# Patient Record
Sex: Male | Born: 1980 | Race: Black or African American | Hispanic: No | Marital: Single | Smoking: Never smoker
Health system: Southern US, Community
[De-identification: ages and names within clinical notes are randomized; demographics above are authoritative.]

## PROBLEM LIST (undated history)

## (undated) DIAGNOSIS — R29898 Other symptoms and signs involving the musculoskeletal system: Secondary | ICD-10-CM

## (undated) DIAGNOSIS — M79606 Pain in leg, unspecified: Secondary | ICD-10-CM

## (undated) DIAGNOSIS — E119 Type 2 diabetes mellitus without complications: Secondary | ICD-10-CM

## (undated) DIAGNOSIS — J302 Other seasonal allergic rhinitis: Secondary | ICD-10-CM

## (undated) DIAGNOSIS — T7840XA Allergy, unspecified, initial encounter: Secondary | ICD-10-CM

## (undated) HISTORY — DX: Other symptoms and signs involving the musculoskeletal system: R29.898

## (undated) HISTORY — DX: Other seasonal allergic rhinitis: J30.2

## (undated) HISTORY — DX: Allergy, unspecified, initial encounter: T78.40XA

## (undated) HISTORY — DX: Pain in leg, unspecified: M79.606

## (undated) HISTORY — PX: DENTAL SURGERY: SHX609

## (undated) HISTORY — DX: Type 2 diabetes mellitus without complications: E11.9

---

## 2003-02-27 ENCOUNTER — Emergency Department (HOSPITAL_COMMUNITY): Admission: EM | Admit: 2003-02-27 | Discharge: 2003-02-27 | Payer: Self-pay | Admitting: Emergency Medicine

## 2006-10-13 ENCOUNTER — Emergency Department (HOSPITAL_COMMUNITY): Admission: EM | Admit: 2006-10-13 | Discharge: 2006-10-13 | Payer: Self-pay | Admitting: Emergency Medicine

## 2006-10-21 ENCOUNTER — Emergency Department (HOSPITAL_COMMUNITY): Admission: EM | Admit: 2006-10-21 | Discharge: 2006-10-21 | Payer: Self-pay | Admitting: Emergency Medicine

## 2008-02-28 ENCOUNTER — Emergency Department (HOSPITAL_COMMUNITY): Admission: EM | Admit: 2008-02-28 | Discharge: 2008-02-28 | Payer: Self-pay | Admitting: Emergency Medicine
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
## 2011-04-29 ENCOUNTER — Emergency Department (HOSPITAL_COMMUNITY)
Admission: EM | Admit: 2011-04-29 | Discharge: 2011-04-29 | Disposition: A | Discharge status: Home / Self Care | Payer: Self-pay | Attending: Emergency Medicine | Admitting: Emergency Medicine

## 2011-04-29 ENCOUNTER — Emergency Department (HOSPITAL_COMMUNITY): Payer: Self-pay

## 2011-04-29 DIAGNOSIS — Y92009 Unspecified place in unspecified non-institutional (private) residence as the place of occurrence of the external cause: Secondary | ICD-10-CM | POA: Insufficient documentation

## 2011-04-29 DIAGNOSIS — S41109A Unspecified open wound of unspecified upper arm, initial encounter: Secondary | ICD-10-CM | POA: Insufficient documentation

## 2011-04-29 DIAGNOSIS — M79609 Pain in unspecified limb: Secondary | ICD-10-CM | POA: Insufficient documentation

## 2011-04-29 DIAGNOSIS — W260XXA Contact with knife, initial encounter: Secondary | ICD-10-CM | POA: Insufficient documentation

## 2011-05-09 ENCOUNTER — Emergency Department (HOSPITAL_COMMUNITY)
Admission: EM | Admit: 2011-05-09 | Discharge: 2011-05-09 | Disposition: A | Discharge status: Home / Self Care | Payer: Self-pay | Attending: Emergency Medicine | Admitting: Emergency Medicine

## 2011-05-09 DIAGNOSIS — Z0389 Encounter for observation for other suspected diseases and conditions ruled out: Secondary | ICD-10-CM | POA: Insufficient documentation

## 2011-05-09 DIAGNOSIS — Z4802 Encounter for removal of sutures: Secondary | ICD-10-CM | POA: Insufficient documentation

## 2015-07-14 ENCOUNTER — Ambulatory Visit (INDEPENDENT_AMBULATORY_CARE_PROVIDER_SITE_OTHER): Payer: Self-pay | Admitting: Family Medicine

## 2015-07-14 VITALS — BP 122/74 | HR 80 | Temp 98.7°F | Resp 18 | Ht 75.0 in | Wt 197.0 lb

## 2015-07-14 DIAGNOSIS — J069 Acute upper respiratory infection, unspecified: Secondary | ICD-10-CM

## 2015-07-14 DIAGNOSIS — J029 Acute pharyngitis, unspecified: Secondary | ICD-10-CM

## 2015-07-14 NOTE — Patient Instructions (Signed)
Saline nasal spray atleast 4 times per day for nasal congestion, over the counter mucinex or mucinex DM if needed, cepacol if needed for sore throat, drink plenty of fluids.  Return to the clinic or go to the nearest emergency room if any of your symptoms worsen or new symptoms occur.  Sore Throat A sore throat is pain, burning, irritation, or scratchiness of the throat. There is often pain or tenderness when swallowing or talking. A sore throat may be accompanied by other symptoms, such as coughing, sneezing, fever, and swollen neck glands. A sore throat is often the first sign of another sickness, such as a cold, flu, strep throat, or mononucleosis (commonly known as mono). Most sore throats go away without medical treatment. CAUSES  The most common causes of a sore throat include:  A viral infection, such as a cold, flu, or mono.  A bacterial infection, such as strep throat, tonsillitis, or whooping cough.  Seasonal allergies.  Dryness in the air.  Irritants, such as smoke or pollution.  Gastroesophageal reflux disease (GERD). HOME CARE INSTRUCTIONS   Only take over-the-counter medicines as directed by your caregiver.  Drink enough fluids to keep your urine clear or pale yellow.  Rest as needed.  Try using throat sprays, lozenges, or sucking on hard candy to ease any pain (if older than 4 years or as directed).  Sip warm liquids, such as broth, herbal tea, or warm water with honey to relieve pain temporarily. You may also eat or drink cold or frozen liquids such as frozen ice pops.  Gargle with salt water (mix 1 tsp salt with 8 oz of water).  Do not smoke and avoid secondhand smoke.  Put a cool-mist humidifier in your bedroom at night to moisten the air. You can also turn on a hot shower and sit in the bathroom with the door closed for 5-10 minutes. SEEK IMMEDIATE MEDICAL CARE IF:  You have difficulty breathing.  You are unable to swallow fluids, soft foods, or your  saliva.  You have increased swelling in the throat.  Your sore throat does not get better in 7 days.  You have nausea and vomiting.  You have a fever or persistent symptoms for more than 2-3 days.  You have a fever and your symptoms suddenly get worse. MAKE SURE YOU:   Understand these instructions.  Will watch your condition.  Will get help right away if you are not doing well or get worse. Document Released: 11/09/2004 Document Revised: 09/18/2012 Document Reviewed: 06/09/2012 [facility] Patient Information 2015 [place], [place]. This information is not intended to replace advice given to you by your health care provider. Make sure you discuss any questions you have with your health care provider.  Upper Respiratory Infection, Adult An upper respiratory infection (URI) is also sometimes known as the common cold. The upper respiratory tract includes the nose, sinuses, throat, trachea, and bronchi. Bronchi are the airways leading to the lungs. Most people improve within 1 week, but symptoms can last up to 2 weeks. A residual cough may last even longer.  CAUSES Many different viruses can infect the tissues lining the upper respiratory tract. The tissues become irritated and inflamed and often become very moist. Mucus production is also common. A cold is contagious. You can easily spread the virus to others by oral contact. This includes kissing, sharing a glass, coughing, or sneezing. Touching your mouth or nose and then touching a surface, which is then touched by another person, can also spread  the virus. SYMPTOMS  Symptoms typically develop 1 to 3 days after you come in contact with a cold virus. Symptoms vary from person to person. They may include:  Runny nose.  Sneezing.  Nasal congestion.  Sinus irritation.  Sore throat.  Loss of voice (laryngitis).  Cough.  Fatigue.  Muscle aches.  Loss of appetite.  Headache.  Low-grade fever. DIAGNOSIS  You might diagnose  your own cold based on familiar symptoms, since most people get a cold 2 to 3 times a year. Your caregiver can confirm this based on your exam. Most importantly, your caregiver can check that your symptoms are not due to another disease such as strep throat, sinusitis, pneumonia, asthma, or epiglottitis. Blood tests, throat tests, and X-rays are not necessary to diagnose a common cold, but they may sometimes be helpful in excluding other more serious diseases. Your caregiver will decide if any further tests are required. RISKS AND COMPLICATIONS  You may be at risk for a more severe case of the common cold if you smoke cigarettes, have chronic heart disease (such as heart failure) or lung disease (such as asthma), or if you have a weakened immune system. The very young and very old are also at risk for more serious infections. Bacterial sinusitis, middle ear infections, and bacterial pneumonia can complicate the common cold. The common cold can worsen asthma and chronic obstructive pulmonary disease (COPD). Sometimes, these complications can require emergency medical care and may be life-threatening. PREVENTION  The best way to protect against getting a cold is to practice good hygiene. Avoid oral or hand contact with people with cold symptoms. Wash your hands often if contact occurs. There is no clear evidence that vitamin C, vitamin E, echinacea, or exercise reduces the chance of developing a cold. However, it is always recommended to get plenty of rest and practice good nutrition. TREATMENT  Treatment is directed at relieving symptoms. There is no cure. Antibiotics are not effective, because the infection is caused by a virus, not by bacteria. Treatment may include:  Increased fluid intake. Sports drinks offer valuable electrolytes, sugars, and fluids.  Breathing heated mist or steam (vaporizer or shower).  Eating chicken soup or other clear broths, and maintaining good nutrition.  Getting plenty of  rest.  Using gargles or lozenges for comfort.  Controlling fevers with ibuprofen or acetaminophen as directed by your caregiver.  Increasing usage of your inhaler if you have asthma. Zinc gel and zinc lozenges, taken in the first 24 hours of the common cold, can shorten the duration and lessen the severity of symptoms. Pain medicines may help with fever, muscle aches, and throat pain. A variety of non-prescription medicines are available to treat congestion and runny nose. Your caregiver can make recommendations and may suggest nasal or lung inhalers for other symptoms.  HOME CARE INSTRUCTIONS   Only take over-the-counter or prescription medicines for pain, discomfort, or fever as directed by your caregiver.  Use a warm mist humidifier or inhale steam from a shower to increase air moisture. This may keep secretions moist and make it easier to breathe.  Drink enough water and fluids to keep your urine clear or pale yellow.  Rest as needed.  Return to work when your temperature has returned to normal or as your caregiver advises. You may need to stay home longer to avoid infecting others. You can also use a face mask and careful hand washing to prevent spread of the virus. SEEK MEDICAL CARE IF:   After  the first few days, you feel you are getting worse rather than better.  You need your caregiver's advice about medicines to control symptoms.  You develop chills, worsening shortness of breath, or brown or red sputum. These may be signs of pneumonia.  You develop yellow or brown nasal discharge or pain in the face, especially when you bend forward. These may be signs of sinusitis.  You develop a fever, swollen neck glands, pain with swallowing, or white areas in the back of your throat. These may be signs of strep throat. SEEK IMMEDIATE MEDICAL CARE IF:   You have a fever.  You develop severe or persistent headache, ear pain, sinus pain, or chest pain.  You develop wheezing, a  prolonged cough, cough up blood, or have a change in your usual mucus (if you have chronic lung disease).  You develop sore muscles or a stiff neck. Document Released: 03/28/2001 Document Revised: 12/25/2011 Document Reviewed: 01/07/2014 [facility] Patient Information 2015 [place], [place]. This information is not intended to replace advice given to you by your health care provider. Make sure you discuss any questions you have with your health care provider.

## 2015-07-14 NOTE — Progress Notes (Addendum)
Subjective:    Patient ID: Edward Walls, male    DOB: 09-Aug-1981, 34 y.o.   MRN: [id number] This chart was scribed for Meredith Staggers, MD by Jolene Provost, Medical Scribe. This patient was seen in Room 10 and the patient's care was started a 4:00 PM.  Chief Complaint  Patient presents with  . Nasal Congestion    x2 days   . Cough  . Sore Throat    HPI HPI Comments: Edward Walls is a 34 y.o. male who presents to [facility] complaining of sore throat, sinus pressure, rhinorrhea, and cough for the last two days. He denies fever, chills, nausea, vomiting or SOB. His boss at work has been sick. He has taken chloroseptic for his throat, but no other medications. He states his appetite has been normal.   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  There are no active problems to display for this patient.  Past Medical History  Diagnosis Date  . Allergy    History reviewed. No pertinent past surgical history. No Known Allergies Prior to Admission medications   Not on File   Social History   Social History  . Marital Status: Single    Spouse Name: N/A  . Number of Children: N/A  . Years of Education: N/A   Occupational History  . Not on file.   Social History Main Topics  . Smoking status: Never Smoker   . Smokeless tobacco: Not on file  . Alcohol Use: No  . Drug Use: No  . Sexual Activity: Not on file   Other Topics Concern  . Not on file   Social History Narrative  . No narrative on file    Review of Systems  Constitutional: Negative for fever and chills.  HENT: Positive for rhinorrhea, sinus pressure and sore throat.   Respiratory: Positive for cough. Negative for shortness of breath and wheezing.        Objective:   Physical Exam  Constitutional: He is oriented to person, place, and time. He appears well-developed and well-nourished. No distress.  HENT:  Head: Normocephalic and atraumatic.  Right Ear: Tympanic membrane,  external ear and ear canal normal.  Left Ear: Tympanic membrane, external ear and ear canal normal.  Nose: No rhinorrhea.  Mouth/Throat: Oropharynx is clear and moist and mucous membranes are normal. No oropharyngeal exudate or posterior oropharyngeal erythema.  TMs pearly grey.  Eyes: Conjunctivae are normal. Pupils are equal, round, and reactive to light.  Neck: Neck supple.  No lymphadenopathy.   Cardiovascular: Normal rate, regular rhythm, normal heart sounds and intact distal pulses.   No murmur heard. Pulmonary/Chest: Effort normal and breath sounds normal. No respiratory distress. He has no wheezes. He has no rhonchi. He has no rales.  Abdominal: Soft. There is no tenderness.  Musculoskeletal: Normal range of motion.  Lymphadenopathy:    He has no cervical adenopathy.  Neurological: He is alert and oriented to person, place, and time. Coordination normal.  Skin: Skin is warm and dry. No rash noted. He is not diaphoretic.  Psychiatric: He has a normal mood and affect. His behavior is normal.  Nursing note and vitals reviewed.   Filed Vitals:   07/14/15 1507  BP: 122/74  Pulse: 80  Temp: 98.7 F (37.1 C)  TempSrc: Oral  Resp: 18  Height:  (1.905 m)  Weight: 197 lb (89.359 kg)  SpO2: 98%       Assessment &  Plan:  Edward Walls is a 34 y.o. male Sore throat  Acute upper respiratory infection  Suspected early viral URI with sore throat. Reassuring vitals, reassuring exam.  -Symptomatic care with fluids, Mucinex if needed for cough, saline nasal spray for congestion, Cepacol or other cough drops as needed for sore throat. Samples given #4 of Cepacol. RTC precautions, and note out of work for 2 days.  No orders of the defined types were placed in this encounter.   Patient Instructions  Saline nasal spray atleast 4 times per day for nasal congestion, over the counter mucinex or mucinex DM if needed, cepacol if needed for sore throat, drink plenty of  fluids.  Return to the clinic or go to the nearest emergency room if any of your symptoms worsen or new symptoms occur.  Sore Throat A sore throat is pain, burning, irritation, or scratchiness of the throat. There is often pain or tenderness when swallowing or talking. A sore throat may be accompanied by other symptoms, such as coughing, sneezing, fever, and swollen neck glands. A sore throat is often the first sign of another sickness, such as a cold, flu, strep throat, or mononucleosis (commonly known as mono). Most sore throats go away without medical treatment. CAUSES  The most common causes of a sore throat include:  A viral infection, such as a cold, flu, or mono.  A bacterial infection, such as strep throat, tonsillitis, or whooping cough.  Seasonal allergies.  Dryness in the air.  Irritants, such as smoke or pollution.  Gastroesophageal reflux disease (GERD). HOME CARE INSTRUCTIONS   Only take over-the-counter medicines as directed by your caregiver.  Drink enough fluids to keep your urine clear or pale yellow.  Rest as needed.  Try using throat sprays, lozenges, or sucking on hard candy to ease any pain (if older than 4 years or as directed).  Sip warm liquids, such as broth, herbal tea, or warm water with honey to relieve pain temporarily. You may also eat or drink cold or frozen liquids such as frozen ice pops.  Gargle with salt water (mix 1 tsp salt with 8 oz of water).  Do not smoke and avoid secondhand smoke.  Put a cool-mist humidifier in your bedroom at night to moisten the air. You can also turn on a hot shower and sit in the bathroom with the door closed for 5-10 minutes. SEEK IMMEDIATE MEDICAL CARE IF:  You have difficulty breathing.  You are unable to swallow fluids, soft foods, or your saliva.  You have increased swelling in the throat.  Your sore throat does not get better in 7 days.  You have nausea and vomiting.  You have a fever or persistent  symptoms for more than 2-3 days.  You have a fever and your symptoms suddenly get worse. MAKE SURE YOU:   Understand these instructions.  Will watch your condition.  Will get help right away if you are not doing well or get worse. Document Released: 11/09/2004 Document Revised: 09/18/2012 Document Reviewed: 06/09/2012 [facility] Patient Information 2015 [place], [place]. This information is not intended to replace advice given to you by your health care provider. Make sure you discuss any questions you have with your health care provider.  Upper Respiratory Infection, Adult An upper respiratory infection (URI) is also sometimes known as the common cold. The upper respiratory tract includes the nose, sinuses, throat, trachea, and bronchi. Bronchi are the airways leading to the lungs. Most people improve within 1 week, but symptoms  can last up to 2 weeks. A residual cough may last even longer.  CAUSES Many different viruses can infect the tissues lining the upper respiratory tract. The tissues become irritated and inflamed and often become very moist. Mucus production is also common. A cold is contagious. You can easily spread the virus to others by oral contact. This includes kissing, sharing a glass, coughing, or sneezing. Touching your mouth or nose and then touching a surface, which is then touched by another person, can also spread the virus. SYMPTOMS  Symptoms typically develop 1 to 3 days after you come in contact with a cold virus. Symptoms vary from person to person. They may include:  Runny nose.  Sneezing.  Nasal congestion.  Sinus irritation.  Sore throat.  Loss of voice (laryngitis).  Cough.  Fatigue.  Muscle aches.  Loss of appetite.  Headache.  Low-grade fever. DIAGNOSIS  You might diagnose your own cold based on familiar symptoms, since most people get a cold 2 to 3 times a year. Your caregiver can confirm this based on your exam. Most importantly, your  caregiver can check that your symptoms are not due to another disease such as strep throat, sinusitis, pneumonia, asthma, or epiglottitis. Blood tests, throat tests, and X-rays are not necessary to diagnose a common cold, but they may sometimes be helpful in excluding other more serious diseases. Your caregiver will decide if any further tests are required. RISKS AND COMPLICATIONS  You may be at risk for a more severe case of the common cold if you smoke cigarettes, have chronic heart disease (such as heart failure) or lung disease (such as asthma), or if you have a weakened immune system. The very young and very old are also at risk for more serious infections. Bacterial sinusitis, middle ear infections, and bacterial pneumonia can complicate the common cold. The common cold can worsen asthma and chronic obstructive pulmonary disease (COPD). Sometimes, these complications can require emergency medical care and may be life-threatening. PREVENTION  The best way to protect against getting a cold is to practice good hygiene. Avoid oral or hand contact with people with cold symptoms. Wash your hands often if contact occurs. There is no clear evidence that vitamin C, vitamin E, echinacea, or exercise reduces the chance of developing a cold. However, it is always recommended to get plenty of rest and practice good nutrition. TREATMENT  Treatment is directed at relieving symptoms. There is no cure. Antibiotics are not effective, because the infection is caused by a virus, not by bacteria. Treatment may include:  Increased fluid intake. Sports drinks offer valuable electrolytes, sugars, and fluids.  Breathing heated mist or steam (vaporizer or shower).  Eating chicken soup or other clear broths, and maintaining good nutrition.  Getting plenty of rest.  Using gargles or lozenges for comfort.  Controlling fevers with ibuprofen or acetaminophen as directed by your caregiver.  Increasing usage of your  inhaler if you have asthma. Zinc gel and zinc lozenges, taken in the first 24 hours of the common cold, can shorten the duration and lessen the severity of symptoms. Pain medicines may help with fever, muscle aches, and throat pain. A variety of non-prescription medicines are available to treat congestion and runny nose. Your caregiver can make recommendations and may suggest nasal or lung inhalers for other symptoms.  HOME CARE INSTRUCTIONS   Only take over-the-counter or prescription medicines for pain, discomfort, or fever as directed by your caregiver.  Use a warm mist humidifier or inhale steam  from a shower to increase air moisture. This may keep secretions moist and make it easier to breathe.  Drink enough water and fluids to keep your urine clear or pale yellow.  Rest as needed.  Return to work when your temperature has returned to normal or as your caregiver advises. You may need to stay home longer to avoid infecting others. You can also use a face mask and careful hand washing to prevent spread of the virus. SEEK MEDICAL CARE IF:   After the first few days, you feel you are getting worse rather than better.  You need your caregiver's advice about medicines to control symptoms.  You develop chills, worsening shortness of breath, or brown or red sputum. These may be signs of pneumonia.  You develop yellow or brown nasal discharge or pain in the face, especially when you bend forward. These may be signs of sinusitis.  You develop a fever, swollen neck glands, pain with swallowing, or white areas in the back of your throat. These may be signs of strep throat. SEEK IMMEDIATE MEDICAL CARE IF:   You have a fever.  You develop severe or persistent headache, ear pain, sinus pain, or chest pain.  You develop wheezing, a prolonged cough, cough up blood, or have a change in your usual mucus (if you have chronic lung disease).  You develop sore muscles or a stiff neck. Document  Released: 03/28/2001 Document Revised: 12/25/2011 Document Reviewed: 01/07/2014 [facility] Patient Information 2015 [place], [place]. This information is not intended to replace advice given to you by your health care provider. Make sure you discuss any questions you have with your health care provider.     I personally performed the services described in this documentation, which was scribed in my presence. The recorded information has been reviewed and considered, and addended by me as needed.    By signing my name below, I, Javier Docker, attest that this documentation has been prepared under the direction and in the presence of Meredith Staggers, MD. Electronically Signed: Javier Docker, ER Scribe. 07/14/2015. 4:00 PM.

## 2016-05-05 ENCOUNTER — Emergency Department (HOSPITAL_BASED_OUTPATIENT_CLINIC_OR_DEPARTMENT_OTHER)
Admission: EM | Admit: 2016-05-05 | Discharge: 2016-05-05 | Disposition: A | Discharge status: Home / Self Care | Payer: Managed Care, Other (non HMO) | Attending: Emergency Medicine | Admitting: Emergency Medicine

## 2016-05-05 ENCOUNTER — Encounter (HOSPITAL_BASED_OUTPATIENT_CLINIC_OR_DEPARTMENT_OTHER): Payer: Self-pay | Admitting: Emergency Medicine

## 2016-05-05 DIAGNOSIS — K5901 Slow transit constipation: Secondary | ICD-10-CM | POA: Diagnosis not present

## 2016-05-05 DIAGNOSIS — R1033 Periumbilical pain: Secondary | ICD-10-CM | POA: Diagnosis present

## 2016-05-05 DIAGNOSIS — M545 Low back pain: Secondary | ICD-10-CM | POA: Insufficient documentation

## 2016-05-05 LAB — COMPREHENSIVE METABOLIC PANEL
ALBUMIN: 4.1 g/dL (ref 3.5–5.0)
ALK PHOS: 55 U/L (ref 38–126)
ALT: 37 U/L (ref 17–63)
AST: 25 U/L (ref 15–41)
Anion gap: 7 (ref 5–15)
BILIRUBIN TOTAL: 0.6 mg/dL (ref 0.3–1.2)
BUN: 18 mg/dL (ref 6–20)
CALCIUM: 8.9 mg/dL (ref 8.9–10.3)
CO2: 28 mmol/L (ref 22–32)
CREATININE: 0.82 mg/dL (ref 0.61–1.24)
Chloride: 102 mmol/L (ref 101–111)
GFR calc Af Amer: >60 mL/min (ref >60)
Glucose, Bld: 110 mg/dL — ABNORMAL HIGH (ref 65–99)
POTASSIUM: 4 mmol/L (ref 3.5–5.1)
Sodium: 137 mmol/L (ref 135–145)
TOTAL PROTEIN: 7.2 g/dL (ref 6.5–8.1)

## 2016-05-05 LAB — URINALYSIS, ROUTINE W REFLEX MICROSCOPIC
Bilirubin Urine: NEGATIVE
GLUCOSE, UA: NEGATIVE mg/dL
Hgb urine dipstick: NEGATIVE
KETONES UR: NEGATIVE mg/dL
LEUKOCYTES UA: NEGATIVE
NITRITE: NEGATIVE
PROTEIN: NEGATIVE mg/dL
Specific Gravity, Urine: 1.024 (ref 1.005–1.030)
pH: 7 (ref 5.0–8.0)

## 2016-05-05 LAB — CBC WITH DIFFERENTIAL/PLATELET
BASOS ABS: 0 10*3/uL (ref 0.0–0.1)
BASOS PCT: 0 %
Eosinophils Absolute: 0.1 10*3/uL (ref 0.0–0.7)
Eosinophils Relative: 2 %
HEMATOCRIT: 40.3 % (ref 39.0–52.0)
HEMOGLOBIN: 12.9 g/dL — AB (ref 13.0–17.0)
LYMPHS PCT: 40 %
Lymphs Abs: 1.8 10*3/uL (ref 0.7–4.0)
MCH: 27.5 pg (ref 26.0–34.0)
MCHC: 32 g/dL (ref 30.0–36.0)
MCV: 85.9 fL (ref 78.0–100.0)
MONO ABS: 0.6 10*3/uL (ref 0.1–1.0)
Monocytes Relative: 14 %
NEUTROS ABS: 1.9 10*3/uL (ref 1.7–7.7)
NEUTROS PCT: 44 %
Platelets: 227 10*3/uL (ref 150–400)
RBC: 4.69 MIL/uL (ref 4.22–5.81)
RDW: 12.8 % (ref 11.5–15.5)
WBC: 4.4 10*3/uL (ref 4.0–10.5)

## 2016-05-05 LAB — LIPASE, BLOOD: LIPASE: 31 U/L (ref 11–51)

## 2016-05-05 MED ORDER — POLYETHYLENE GLYCOL 3350 17 GM/SCOOP PO POWD: ORAL | Status: DC

## 2016-05-05 NOTE — ED Notes (Signed)
Pt c/o bilateral lower back pain and right lateral side pain since Monday. Has mag citrate for constipation, was able to go but pain persists. Ambulatory with steady gait. NAD

## 2016-05-05 NOTE — ED Provider Notes (Signed)
CSN: [id number]     Arrival date & time 05/05/16  [id number] History   First MD Initiated Contact with Patient 05/05/16 0732     Chief Complaint  Patient presents with  . Back Pain     (Consider location/radiation/quality/duration/timing/severity/associated sxs/prior Treatment) Patient is a 35 y.o. male presenting with back pain and abdominal pain. The history is provided by the patient.  Back Pain Associated symptoms: abdominal pain   Associated symptoms: no fever   Abdominal Pain Pain location:  Periumbilical Pain quality: cramping and sharp   Pain radiates to:  R flank and back Pain severity:  Moderate Onset quality:  Gradual Duration:  4 days Timing:  Intermittent Progression:  Waxing and waning Chronicity:  New Context: alcohol use (every  2 weeks drinks 4 shots + a beer)   Relieved by:  Nothing Worsened by:  Nothing tried Ineffective treatments:  None tried Associated symptoms: constipation (4-5 days )   Associated symptoms: no fever and no vomiting     Past Medical History  Diagnosis Date  . Allergy    History reviewed. No pertinent past surgical history. No family history on file. Social History  Substance Use Topics  . Smoking status: Never Smoker   . Smokeless tobacco: None  . Alcohol Use: No    Review of Systems  Constitutional: Negative for fever.  Gastrointestinal: Positive for abdominal pain and constipation (4-5 days ). Negative for vomiting.  Musculoskeletal: Positive for back pain.  All other systems reviewed and are negative.     Allergies  Review of patient's allergies indicates no known allergies.  Home Medications   Prior to Admission medications   Not on File   BP 137/89 mmHg  Pulse 82  Temp(Src) 99 F (37.2 C) (Oral)  Resp 20  Ht 6\' 3"  (1.905 m)  Wt 220 lb (99.791 kg)  BMI 27.50 kg/m2  SpO2 98% Physical Exam  Constitutional: He is oriented to person, place, and time. He appears well-developed and well-nourished. No distress.   HENT:  Head: Normocephalic and atraumatic.  Eyes: Conjunctivae are normal.  Neck: Neck supple. No tracheal deviation present.  Cardiovascular: Normal rate and regular rhythm.   Pulmonary/Chest: Effort normal. No respiratory distress.  Abdominal: Soft. Normal appearance. He exhibits no distension. There is hepatomegaly. There is tenderness in the right upper quadrant and epigastric area. There is no rigidity, no guarding, no tenderness at McBurney's point and negative Murphy's sign.  Neurological: He is alert and oriented to person, place, and time.  Skin: Skin is warm and dry.  Psychiatric: He has a normal mood and affect.    ED Course  Procedures (including critical care time) Labs Review Labs Reviewed  CBC WITH DIFFERENTIAL/PLATELET - Abnormal; Notable for the following:    Hemoglobin 12.9 (*)    All other components within normal limits  COMPREHENSIVE METABOLIC PANEL - Abnormal; Notable for the following:    Glucose, Bld 110 (*)    All other components within normal limits  URINALYSIS, ROUTINE W REFLEX MICROSCOPIC (NOT AT [facility])  LIPASE, BLOOD    Imaging Review No results found. I have personally reviewed and evaluated these images and lab results as part of my medical decision-making.   EKG Interpretation None      MDM   Final diagnoses:  Constipation, slow transit    35 year old male presents with right upper quadrant and epigastric abdominal pain that radiates towards his back and right flank. This is been occurring intermittently over the last 5 days and  has been helped with ibuprofen. He is well-appearing. This does not appear to be exacerbated by food intake. He has had some constipation which may be the cause of his discomfort. This was briefly relieved by mag citrate but has recurred today despite having bowel movements. Given location and description of pain screening laboratory work is warranted to rule out biliary or pancreatic etiology.   Labs reassuring, no  change in clinical status throughout emergency department stay. We will treat empirically with MiraLAX cleanout for constipation. Patient needs to establish primary care in the area and was provided contact information to do so. Return precautions discussed for worsening or new concerning symptoms.   Lyndal Pulley, MD 05/05/16 [phone]

## 2016-05-05 NOTE — ED Notes (Signed)
MD at bedside. 

## 2016-05-05 NOTE — Discharge Instructions (Signed)

## 2016-10-20 ENCOUNTER — Encounter (HOSPITAL_COMMUNITY): Payer: Self-pay

## 2016-10-20 ENCOUNTER — Observation Stay (HOSPITAL_COMMUNITY)
Admission: EM | Admit: 2016-10-20 | Discharge: 2016-10-22 | Disposition: A | Discharge status: Home / Self Care | Payer: Managed Care, Other (non HMO) | Attending: Internal Medicine | Admitting: Internal Medicine

## 2016-10-20 DIAGNOSIS — E1110 Type 2 diabetes mellitus with ketoacidosis without coma: Principal | ICD-10-CM

## 2016-10-20 DIAGNOSIS — Z794 Long term (current) use of insulin: Secondary | ICD-10-CM | POA: Diagnosis not present

## 2016-10-20 DIAGNOSIS — E872 Acidosis: Secondary | ICD-10-CM | POA: Diagnosis not present

## 2016-10-20 DIAGNOSIS — E860 Dehydration: Secondary | ICD-10-CM | POA: Diagnosis not present

## 2016-10-20 DIAGNOSIS — R739 Hyperglycemia, unspecified: Secondary | ICD-10-CM | POA: Diagnosis not present

## 2016-10-20 DIAGNOSIS — Z833 Family history of diabetes mellitus: Secondary | ICD-10-CM | POA: Diagnosis not present

## 2016-10-20 DIAGNOSIS — E1310 Other specified diabetes mellitus with ketoacidosis without coma: Secondary | ICD-10-CM | POA: Diagnosis not present

## 2016-10-20 LAB — CBG MONITORING, ED
Glucose-Capillary: >600 mg/dL (ref 65–99)
Glucose-Capillary: 497 mg/dL — ABNORMAL HIGH (ref 65–99)

## 2016-10-20 LAB — BLOOD GAS, VENOUS
ACID-BASE DEFICIT: 10.8 mmol/L — AB (ref 0.0–2.0)
BICARBONATE: 16.5 mmol/L — AB (ref 20.0–28.0)
DRAWN BY: 295031
FIO2: 21
O2 Saturation: 49.8 %
PATIENT TEMPERATURE: 98.6
PH VEN: 7.213 — AB (ref 7.250–7.430)
pCO2, Ven: 42.5 mmHg — ABNORMAL LOW (ref 44.0–60.0)
pO2, Ven: 32 mmHg (ref 32.0–45.0)

## 2016-10-20 LAB — CBC
HEMATOCRIT: 43.9 % (ref 39.0–52.0)
HEMATOCRIT: 39.4 % (ref 39.0–52.0)
Hemoglobin: 15.2 g/dL (ref 13.0–17.0)
Hemoglobin: 13.1 g/dL (ref 13.0–17.0)
MCH: 28 pg (ref 26.0–34.0)
MCH: 26.7 pg (ref 26.0–34.0)
MCHC: 34.6 g/dL (ref 30.0–36.0)
MCHC: 33.2 g/dL (ref 30.0–36.0)
MCV: 81 fL (ref 78.0–100.0)
MCV: 80.4 fL (ref 78.0–100.0)
PLATELETS: 224 10*3/uL (ref 150–400)
PLATELETS: 200 10*3/uL (ref 150–400)
RBC: 5.42 MIL/uL (ref 4.22–5.81)
RBC: 4.9 MIL/uL (ref 4.22–5.81)
RDW: 12.7 % (ref 11.5–15.5)
RDW: 12.5 % (ref 11.5–15.5)
WBC: 6.7 10*3/uL (ref 4.0–10.5)
WBC: 7.1 10*3/uL (ref 4.0–10.5)

## 2016-10-20 LAB — GLUCOSE, CAPILLARY
GLUCOSE-CAPILLARY: 157 mg/dL — AB (ref 65–99)
Glucose-Capillary: 169 mg/dL — ABNORMAL HIGH (ref 65–99)
Glucose-Capillary: 263 mg/dL — ABNORMAL HIGH (ref 65–99)

## 2016-10-20 LAB — URINALYSIS, ROUTINE W REFLEX MICROSCOPIC
Bilirubin Urine: NEGATIVE
Hgb urine dipstick: NEGATIVE
KETONES UR: 80 mg/dL — AB
LEUKOCYTES UA: NEGATIVE
Nitrite: NEGATIVE
PH: 5 (ref 5.0–8.0)
PROTEIN: NEGATIVE mg/dL
SQUAMOUS EPITHELIAL / LPF: NONE SEEN
Specific Gravity, Urine: 1.025 (ref 1.005–1.030)

## 2016-10-20 LAB — BASIC METABOLIC PANEL
Anion gap: 23 — ABNORMAL HIGH (ref 5–15)
Anion gap: 16 — ABNORMAL HIGH (ref 5–15)
Anion gap: 10 (ref 5–15)
BUN: 21 mg/dL — AB (ref 6–20)
BUN: 17 mg/dL (ref 6–20)
BUN: 14 mg/dL (ref 6–20)
CHLORIDE: 90 mmol/L — AB (ref 101–111)
CHLORIDE: 106 mmol/L (ref 101–111)
CO2: 14 mmol/L — AB (ref 22–32)
CO2: 17 mmol/L — ABNORMAL LOW (ref 22–32)
CO2: 20 mmol/L — ABNORMAL LOW (ref 22–32)
CREATININE: 1.34 mg/dL — AB (ref 0.61–1.24)
CREATININE: 1.06 mg/dL (ref 0.61–1.24)
CREATININE: 0.96 mg/dL (ref 0.61–1.24)
Calcium: 9.7 mg/dL (ref 8.9–10.3)
Calcium: 8.8 mg/dL — ABNORMAL LOW (ref 8.9–10.3)
Calcium: 8.5 mg/dL — ABNORMAL LOW (ref 8.9–10.3)
Chloride: 103 mmol/L (ref 101–111)
GFR calc Af Amer: >60 mL/min (ref >60)
GFR calc Af Amer: >60 mL/min (ref >60)
GFR calc Af Amer: >60 mL/min (ref >60)
GFR calc non Af Amer: >60 mL/min (ref >60)
GFR calc non Af Amer: >60 mL/min (ref >60)
GLUCOSE: 867 mg/dL — AB (ref 65–99)
GLUCOSE: 472 mg/dL — AB (ref 65–99)
GLUCOSE: 295 mg/dL — AB (ref 65–99)
POTASSIUM: 4.6 mmol/L (ref 3.5–5.1)
Potassium: 4.3 mmol/L (ref 3.5–5.1)
Potassium: 3.9 mmol/L (ref 3.5–5.1)
SODIUM: 136 mmol/L (ref 135–145)
SODIUM: 136 mmol/L (ref 135–145)
Sodium: 127 mmol/L — ABNORMAL LOW (ref 135–145)

## 2016-10-20 LAB — MRSA PCR SCREENING: MRSA by PCR: NEGATIVE

## 2016-10-20 MED ORDER — SODIUM CHLORIDE 0.9 % IV SOLN: 30.0000 meq | Freq: Once | INTRAVENOUS | Status: DC

## 2016-10-20 MED ORDER — SODIUM CHLORIDE 0.9 % IV SOLN: INTRAVENOUS | Status: DC

## 2016-10-20 MED ORDER — SODIUM CHLORIDE 0.9 % IV BOLUS (SEPSIS)
2000.0000 mL | Freq: Once | INTRAVENOUS | Status: AC
  Administered 2016-10-20: 2000 mL via INTRAVENOUS

## 2016-10-20 MED ORDER — POTASSIUM CHLORIDE CRYS ER 20 MEQ PO TBCR
30.0000 meq | EXTENDED_RELEASE_TABLET | Freq: Once | ORAL | Status: AC
  Administered 2016-10-20: 20:00:00 30 meq via ORAL
  Filled 2016-10-20: qty 1

## 2016-10-20 MED ORDER — INSULIN ASPART 100 UNIT/ML ~~LOC~~ SOLN
12.0000 [IU] | Freq: Once | SUBCUTANEOUS | Status: DC
  Filled 2016-10-20: qty 1

## 2016-10-20 MED ORDER — LACTATED RINGERS IV BOLUS (SEPSIS)
2000.0000 mL | Freq: Once | INTRAVENOUS | Status: AC
  Administered 2016-10-20: 2000 mL via INTRAVENOUS

## 2016-10-20 MED ORDER — SODIUM CHLORIDE 0.9% FLUSH
3.0000 mL | Freq: Two times a day (BID) | INTRAVENOUS | Status: DC
  Administered 2016-10-21 – 2016-10-22 (×3): 3 mL via INTRAVENOUS

## 2016-10-20 MED ORDER — SODIUM CHLORIDE 0.9 % IV SOLN
INTRAVENOUS | Status: DC
  Administered 2016-10-20: 4.4 [IU]/h via INTRAVENOUS
  Filled 2016-10-20: qty 2.5

## 2016-10-20 MED ORDER — DEXTROSE-NACL 5-0.45 % IV SOLN: INTRAVENOUS | Status: DC

## 2016-10-20 MED ORDER — ENOXAPARIN SODIUM 40 MG/0.4ML ~~LOC~~ SOLN
40.0000 mg | SUBCUTANEOUS | Status: DC
  Administered 2016-10-20 – 2016-10-21 (×2): 40 mg via SUBCUTANEOUS
  Filled 2016-10-20 (×2): qty 0.4

## 2016-10-20 MED ORDER — INSULIN REGULAR BOLUS VIA INFUSION
0.0000 [IU] | Freq: Three times a day (TID) | INTRAVENOUS | Status: DC
  Filled 2016-10-20: qty 10

## 2016-10-20 MED ORDER — DEXTROSE 50 % IV SOLN: 25.0000 mL | INTRAVENOUS | Status: DC | PRN

## 2016-10-20 MED ORDER — SODIUM CHLORIDE 0.9 % IV SOLN
INTRAVENOUS | Status: DC
  Administered 2016-10-20: 5.4 [IU]/h via INTRAVENOUS
  Filled 2016-10-20: qty 2.5

## 2016-10-20 MED ORDER — POTASSIUM CHLORIDE 10 MEQ/100ML IV SOLN
10.0000 meq | INTRAVENOUS | Status: DC
  Administered 2016-10-20: 10 meq via INTRAVENOUS
  Filled 2016-10-20 (×3): qty 100

## 2016-10-20 MED ORDER — SODIUM CHLORIDE 0.9 % IV SOLN
INTRAVENOUS | Status: DC
  Administered 2016-10-20: 17:00:00 via INTRAVENOUS

## 2016-10-20 MED ORDER — SODIUM CHLORIDE 0.9 % IV SOLN
INTRAVENOUS | Status: DC
Start: 2016-10-20 — End: 2016-10-20
  Administered 2016-10-20: 15:00:00 via INTRAVENOUS

## 2016-10-20 MED ORDER — SODIUM CHLORIDE 0.9 % IV SOLN
INTRAVENOUS | Status: DC
  Administered 2016-10-20 – 2016-10-21 (×3): via INTRAVENOUS

## 2016-10-20 MED ORDER — DEXTROSE-NACL 5-0.45 % IV SOLN
INTRAVENOUS | Status: DC
  Administered 2016-10-20: 23:00:00 via INTRAVENOUS

## 2016-10-20 MED ORDER — SODIUM CHLORIDE 0.9 % IV SOLN
INTRAVENOUS | Status: AC
  Administered 2016-10-20: 18:00:00 via INTRAVENOUS

## 2016-10-20 NOTE — ED Triage Notes (Signed)
Pt states not feeling well x 1 week.  Dry mouth with frequent urination.  Family hx of diabetes.  Pt has been tired with blurred vision. Went to Nurse at work.  CBG "Hi".  No personal hx of DM

## 2016-10-20 NOTE — ED Provider Notes (Signed)
[facility]-EMERGENCY DEPT Provider Note   CSN: [id number] Arrival date & time: 10/20/16  1411     History   Chief Complaint Chief Complaint  Patient presents with  . dry mouth  . Urinary Frequency  . Hyperglycemia    HPI Edward Walls is a 36 y.o. male.  36 year old male presents with one-week history of polyuria as well as polydipsia. Denies any prior history of diabetes but has had a recent 50 pound waking over the past year. Denies any vomiting or diarrhea. No fever or chills. There is a family history of diabetes. Was at work today because of having weakness as well as blurred vision. Had his blood sugar checked there and it read as high as it was sent here for further evaluation. No treatment given prior to arrival nothing makes the symptoms better or worse.      Past Medical History:  Diagnosis Date  . Allergy     There are no active problems to display for this patient.   History reviewed. No pertinent surgical history.     Home Medications    Prior to Admission medications   Medication Sig Start Date End Date Taking? Authorizing Provider  polyethylene glycol powder (MIRALAX) powder TAKE 6 CAPFULS OF MIRALAX IN A 32 OUNCE GATORADE AND DRINK THE WHOLE BEVERAGE FOLLOWED BY 3 CAPFULS TWICE A DAY FOR THE NEXT WEEK AND FOLLOW UP WITH YOUR PRIMARY CARE PHYSICIAN. 05/05/16   Lyndal Pulleyaniel Knott, MD    Family History History reviewed. No pertinent family history.  Social History Social History  Substance Use Topics  . Smoking status: Never Smoker  . Smokeless tobacco: Never Used  . Alcohol use No     Allergies   Patient has no known allergies.   Review of Systems Review of Systems  All other systems reviewed and are negative.    Physical Exam Updated Vital Signs BP 143/97 (BP Location: Left Arm)   Pulse 100   Temp 97.7 F (36.5 C) (Oral)   Resp 16   SpO2 100%   Physical Exam  Constitutional: He is oriented to person, place, and time. He appears  well-developed and well-nourished.  Non-toxic appearance. No distress.  HENT:  Head: Normocephalic and atraumatic.  Eyes: Conjunctivae, EOM and lids are normal. Pupils are equal, round, and reactive to light.  Neck: Normal range of motion. Neck supple. No tracheal deviation present. No thyroid mass present.  Cardiovascular: Regular rhythm and normal heart sounds.  Tachycardia present.  Exam reveals no gallop.   No murmur heard. Pulmonary/Chest: Effort normal and breath sounds normal. No stridor. No respiratory distress. He has no decreased breath sounds. He has no wheezes. He has no rhonchi. He has no rales.  Abdominal: Soft. Normal appearance and bowel sounds are normal. He exhibits no distension. There is no tenderness. There is no rebound and no CVA tenderness.  Musculoskeletal: Normal range of motion. He exhibits no edema or tenderness.  Neurological: He is alert and oriented to person, place, and time. He has normal strength. No cranial nerve deficit or sensory deficit. GCS eye subscore is 4. GCS verbal subscore is 5. GCS motor subscore is 6.  Skin: Skin is warm and dry. No abrasion and no rash noted.  Psychiatric: He has a normal mood and affect. His speech is normal and behavior is normal.  Nursing note and vitals reviewed.    ED Treatments / Results  Labs (all labs ordered are listed, but only abnormal results are displayed) Labs Reviewed  CBG MONITORING, ED - Abnormal; Notable for the following:       Result Value   Glucose-Capillary >600 (*)    All other components within normal limits  BASIC METABOLIC PANEL  CBC  URINALYSIS, ROUTINE W REFLEX MICROSCOPIC  BLOOD GAS, VENOUS  CBG MONITORING, ED    EKG  EKG Interpretation  Date/Time:  Friday October 20 2016 15:38:51 EST Ventricular Rate:  89 PR Interval:    QRS Duration: 94 QT Interval:  367 QTC Calculation: 447 R Axis:   90 Text Interpretation:  Sinus rhythm Probable left atrial enlargement Borderline right axis  deviation Abnormal T, consider ischemia, diffuse leads Confirmed by Freida Busman  MD, Lorrin Bodner ([id number]) on 10/20/2016 3:56:33 PM       Radiology No results found.  Procedures Procedures (including critical care time)  Medications Ordered in ED Medications  lactated ringers bolus 2,000 mL (2,000 mLs Intravenous New Bag/Given 10/20/16 1451)  0.9 %  sodium chloride infusion ( Intravenous New Bag/Given 10/20/16 1451)  insulin aspart (novoLOG) injection 12 Units (12 Units Subcutaneous Not Given 10/20/16 1451)  insulin regular bolus via infusion 0-10 Units (not administered)  insulin regular (NOVOLIN R,HUMULIN R) 250 Units in sodium chloride 0.9 % 250 mL (1 Units/mL) infusion (not administered)  dextrose 50 % solution 25 mL (not administered)  0.9 %  sodium chloride infusion (not administered)  dextrose 5 %-0.45 % sodium chloride infusion (not administered)  sodium chloride 0.9 % bolus 2,000 mL (2,000 mLs Intravenous New Bag/Given 10/20/16 1451)     Initial Impression / Assessment and Plan / ED Course  I have reviewed the triage vital signs and the nursing notes.  Pertinent labs & imaging results that were available during my care of the patient were reviewed by me and considered in my medical decision making (see chart for details).  Clinical Course     Pt given iv fluids and placed on an insulin drip Blood sugar noted and pt hemodynamically stable Spoke with dr Rhona Leavens, will admit to the medicine service  Final Clinical Impressions(s) / ED Diagnoses   Final diagnoses:  None    New Prescriptions New Prescriptions   No medications on file     Lorre Nick, MD 10/20/16 [phone]

## 2016-10-20 NOTE — H&P (Signed)
History and Physical    Edward Walls ZOX:[id number] DOB: 07/30/1981 DOA: 10/20/2016  PCP: No PCP Per Patient  Patient coming from: Home  Chief Complaint: Increased thirst  HPI: Edward HaileyJoshua A Magill is a 36 y.o. male with no significant past medical history who presents with one week hx of polyuria, feeling of dehydration, increased tiredness, blurry vision, increased hunger. While at work, patient was seen by RN who noted a blood glucose that read "Hi." Patient was referred to ED for further work up. On further questioning, patient reports feeling "like I was about to die" while at work on the day of hospital admission. Patient reports subjective sob with increased respirations. Denies chest pains. Does report some nausea without vomiting. Patient does report family history of diabetes, namely with his mother and brother.  ED Course: In the ED, patient was noted to have glucose of 867 with anion gap of 23. PH noted to be 7.21 with serum bicarb of 14. Insulin gtt was started. Patient was started on IVF hydration. Hospitalist consulted for consideration for admission.  Review of Systems:  Review of Systems  Constitutional: Negative for chills, fever and malaise/fatigue.  HENT: Negative for ear discharge, hearing loss, nosebleeds, sinus pain and tinnitus.   Eyes: Negative for blurred vision, double vision and photophobia.  Respiratory: Negative for cough, hemoptysis, sputum production and wheezing.   Cardiovascular: Negative for chest pain, palpitations and claudication.  Gastrointestinal: Positive for nausea. Negative for abdominal pain and vomiting.  Genitourinary: Positive for frequency. Negative for urgency.  Musculoskeletal: Negative for back pain, joint pain and neck pain.  Skin: Negative for itching and rash.  Neurological: Negative for dizziness, tingling, seizures, loss of consciousness and headaches.  Endo/Heme/Allergies: Positive for polydipsia.  Psychiatric/Behavioral: Negative for  hallucinations and memory loss. The patient is not nervous/anxious.     Past Medical History:  Diagnosis Date  . Allergy     History reviewed. No pertinent surgical history.   reports that he has never smoked. He has never used smokeless tobacco. He reports that he does not drink alcohol or use drugs.  Allergies  Allergen Reactions  . Peanut-Containing Drug Products Other (See Comments)    Throat burning     Family History: Mother diabetes Brother diabetes  Prior to Admission medications   Medication Sig Start Date End Date Taking? Authorizing Provider  ibuprofen (ADVIL,MOTRIN) 200 MG tablet Take 400 mg by mouth every 6 (six) hours as needed for headache, mild pain or moderate pain.   Yes Historical Provider, MD  polyethylene glycol powder (MIRALAX) powder TAKE 6 CAPFULS OF MIRALAX IN A 32 OUNCE GATORADE AND DRINK THE WHOLE BEVERAGE FOLLOWED BY 3 CAPFULS TWICE A DAY FOR THE NEXT WEEK AND FOLLOW UP WITH YOUR PRIMARY CARE PHYSICIAN. Patient not taking: Reported on 10/20/2016 05/05/16   Lyndal Pulleyaniel Knott, MD    Physical Exam: Vitals:   10/20/16 1428 10/20/16 1531 10/20/16 1609  BP: 143/97 146/85 146/80  Pulse: 100 84 91  Resp: 16 22 16   Temp: 97.7 F (36.5 C)    TempSrc: Oral    SpO2: 100% 100% 99%    Constitutional: NAD, calm, comfortable Vitals:   10/20/16 1428 10/20/16 1531 10/20/16 1609  BP: 143/97 146/85 146/80  Pulse: 100 84 91  Resp: 16 22 16   Temp: 97.7 F (36.5 C)    TempSrc: Oral    SpO2: 100% 100% 99%   Eyes: PERRL, lids and conjunctivae normal ENMT: Mucous membranes are moist. Posterior pharynx clear of any exudate  or lesions.Normal dentition.  Neck: normal, supple, no masses, no thyromegaly Respiratory: clear to auscultation bilaterally, no wheezing, no crackles. Normal respiratory effort. No accessory muscle use.  Cardiovascular: Regular rate and rhythm, no murmurs / rubs / gallops. No extremity edema. 2+ pedal pulses. No carotid bruits.  Abdomen: no  tenderness, no masses palpated. No hepatosplenomegaly. Bowel sounds positive.  Musculoskeletal: no clubbing / cyanosis. No joint deformity upper and lower extremities. Good ROM, no contractures. Normal muscle tone.  Skin: no rashes, lesions, ulcers. No induration Neurologic: CN 2-12 grossly intact. Sensation intact, DTR normal. Strength 5/5 in all 4.  Psychiatric: Normal judgment and insight. Alert and oriented x 3. Normal mood.    Labs on Admission: I have personally reviewed following labs and imaging studies  CBC:  Recent Labs Lab 10/20/16 1448  WBC 6.7  HGB 15.2  HCT 43.9  MCV 81.0  PLT 224   Basic Metabolic Panel:  Recent Labs Lab 10/20/16 1448  NA 127*  K 4.6  CL 90*  CO2 14*  GLUCOSE 867*  BUN 21*  CREATININE 1.34*  CALCIUM 9.7   GFR: CrCl cannot be calculated (Unknown ideal weight.). Liver Function Tests: No results for input(s): AST, ALT, ALKPHOS, BILITOT, PROT, ALBUMIN in the last 168 hours. No results for input(s): LIPASE, AMYLASE in the last 168 hours. No results for input(s): AMMONIA in the last 168 hours. Coagulation Profile: No results for input(s): INR, PROTIME in the last 168 hours. Cardiac Enzymes: No results for input(s): CKTOTAL, CKMB, CKMBINDEX, TROPONINI in the last 168 hours. BNP (last 3 results) No results for input(s): PROBNP in the last 8760 hours. HbA1C: No results for input(s): HGBA1C in the last 72 hours. CBG:  Recent Labs Lab 10/20/16 1427 10/20/16 1534 10/20/16 1636  GLUCAP >600* >600* 497*   Lipid Profile: No results for input(s): CHOL, HDL, LDLCALC, TRIG, CHOLHDL, LDLDIRECT in the last 72 hours. Thyroid Function Tests: No results for input(s): TSH, T4TOTAL, FREET4, T3FREE, THYROIDAB in the last 72 hours. Anemia Panel: No results for input(s): VITAMINB12, FOLATE, FERRITIN, TIBC, IRON, RETICCTPCT in the last 72 hours. Urine analysis:    Component Value Date/Time   COLORURINE COLORLESS (A) 10/20/2016 1436   APPEARANCEUR  CLEAR 10/20/2016 1436   LABSPEC 1.025 10/20/2016 1436   PHURINE 5.0 10/20/2016 1436   GLUCOSEU >=500 (A) 10/20/2016 1436   HGBUR NEGATIVE 10/20/2016 1436   BILIRUBINUR NEGATIVE 10/20/2016 1436   KETONESUR 80 (A) 10/20/2016 1436   PROTEINUR NEGATIVE 10/20/2016 1436   NITRITE NEGATIVE 10/20/2016 1436   LEUKOCYTESUR NEGATIVE 10/20/2016 1436   Sepsis Labs: !!!!!!!!!!!!!!!!!!!!!!!!!!!!!!!!!!!!!!!!!!!! @LABRCNTIP (procalcitonin:4,lacticidven:4) )No results found for this or any previous visit (from the past 240 hour(s)).   Radiological Exams on Admission: No results found.  EKG: Independently reviewed. NSR  Assessment/Plan Principal Problem:   DKA (diabetic ketoacidoses) (HCC) Active Problems:   Hyperglycemia   Diabetes mellitus type 2 with ketoacidosis, uncontrolled (HCC)   Metabolic acidosis   1. DKA 1. Presenting glucose over 800 with elevated anion gap 2. Will continue with insulin gtt, aggressive IVF as tolerated 3. Monitor electrolytes 4. Repeat serial BMET 5. Consult diabetic coordinator 6. Will check A1c 2. DM 1. New diagnosis 2. Per above, insulin gtt 3. Anticipate transitioning off gtt when gap closes and acidosis improves 3. Metabolic acidosis 1. Secondary to DKA 2. Plan per above  DVT prophylaxis: lovenox  Code Status: Full Family Communication: Pt in room  Disposition Plan: Possible home in 24-48hrs  Consults called:  Admission status: obs, as will  likely require less than two midnight stay to correct DKA   Erianna Jolly, Scheryl Marten MD [facility] Pager [phone]  If 7PM-7AM, please contact night-coverage [email or web address] Password [facility]  10/20/2016, 4:41 PM

## 2016-10-21 DIAGNOSIS — E872 Acidosis: Secondary | ICD-10-CM | POA: Diagnosis not present

## 2016-10-21 DIAGNOSIS — E1310 Other specified diabetes mellitus with ketoacidosis without coma: Secondary | ICD-10-CM | POA: Diagnosis not present

## 2016-10-21 LAB — BASIC METABOLIC PANEL
Anion gap: 8 (ref 5–15)
BUN: 11 mg/dL (ref 6–20)
CALCIUM: 8.2 mg/dL — AB (ref 8.9–10.3)
CO2: 21 mmol/L — ABNORMAL LOW (ref 22–32)
CREATININE: 0.72 mg/dL (ref 0.61–1.24)
Chloride: 108 mmol/L (ref 101–111)
GFR calc non Af Amer: >60 mL/min (ref >60)
Glucose, Bld: 183 mg/dL — ABNORMAL HIGH (ref 65–99)
Potassium: 3.7 mmol/L (ref 3.5–5.1)
SODIUM: 137 mmol/L (ref 135–145)

## 2016-10-21 LAB — GLUCOSE, CAPILLARY
GLUCOSE-CAPILLARY: 167 mg/dL — AB (ref 65–99)
GLUCOSE-CAPILLARY: 149 mg/dL — AB (ref 65–99)
Glucose-Capillary: 141 mg/dL — ABNORMAL HIGH (ref 65–99)
Glucose-Capillary: 258 mg/dL — ABNORMAL HIGH (ref 65–99)
Glucose-Capillary: 156 mg/dL — ABNORMAL HIGH (ref 65–99)
Glucose-Capillary: 166 mg/dL — ABNORMAL HIGH (ref 65–99)
Glucose-Capillary: 178 mg/dL — ABNORMAL HIGH (ref 65–99)
Glucose-Capillary: 248 mg/dL — ABNORMAL HIGH (ref 65–99)
Glucose-Capillary: 415 mg/dL — ABNORMAL HIGH (ref 65–99)
Glucose-Capillary: 421 mg/dL — ABNORMAL HIGH (ref 65–99)
Glucose-Capillary: 338 mg/dL — ABNORMAL HIGH (ref 65–99)
Glucose-Capillary: 300 mg/dL — ABNORMAL HIGH (ref 65–99)

## 2016-10-21 LAB — COMPREHENSIVE METABOLIC PANEL
ALBUMIN: 3.5 g/dL (ref 3.5–5.0)
ALK PHOS: 55 U/L (ref 38–126)
ALT: 26 U/L (ref 17–63)
ANION GAP: 7 (ref 5–15)
AST: 23 U/L (ref 15–41)
BILIRUBIN TOTAL: 1 mg/dL (ref 0.3–1.2)
BUN: 11 mg/dL (ref 6–20)
CALCIUM: 8 mg/dL — AB (ref 8.9–10.3)
CO2: 21 mmol/L — ABNORMAL LOW (ref 22–32)
Chloride: 107 mmol/L (ref 101–111)
Creatinine, Ser: 0.76 mg/dL (ref 0.61–1.24)
GFR calc non Af Amer: >60 mL/min (ref >60)
GLUCOSE: 168 mg/dL — AB (ref 65–99)
POTASSIUM: 3.5 mmol/L (ref 3.5–5.1)
SODIUM: 135 mmol/L (ref 135–145)
TOTAL PROTEIN: 5.9 g/dL — AB (ref 6.5–8.1)

## 2016-10-21 LAB — CBC
HEMATOCRIT: 37.2 % — AB (ref 39.0–52.0)
HEMOGLOBIN: 12.4 g/dL — AB (ref 13.0–17.0)
MCH: 27 pg (ref 26.0–34.0)
MCHC: 33.3 g/dL (ref 30.0–36.0)
MCV: 80.9 fL (ref 78.0–100.0)
Platelets: 200 10*3/uL (ref 150–400)
RBC: 4.6 MIL/uL (ref 4.22–5.81)
RDW: 12.7 % (ref 11.5–15.5)
WBC: 5.1 10*3/uL (ref 4.0–10.5)

## 2016-10-21 LAB — HEMOGLOBIN A1C
HEMOGLOBIN A1C: 12 % — AB (ref 4.8–5.6)
MEAN PLASMA GLUCOSE: 298 mg/dL

## 2016-10-21 MED ORDER — INSULIN ASPART 100 UNIT/ML ~~LOC~~ SOLN: 0.0000 [IU] | SUBCUTANEOUS | Status: DC

## 2016-10-21 MED ORDER — INSULIN ASPART 100 UNIT/ML ~~LOC~~ SOLN
5.0000 [IU] | Freq: Three times a day (TID) | SUBCUTANEOUS | Status: DC
  Administered 2016-10-21 – 2016-10-22 (×3): 5 [IU] via SUBCUTANEOUS

## 2016-10-21 MED ORDER — INSULIN ASPART 100 UNIT/ML ~~LOC~~ SOLN
0.0000 [IU] | Freq: Three times a day (TID) | SUBCUTANEOUS | Status: DC
  Administered 2016-10-21: 11 [IU] via SUBCUTANEOUS
  Administered 2016-10-21 – 2016-10-22 (×2): 8 [IU] via SUBCUTANEOUS
  Administered 2016-10-22: 3 [IU] via SUBCUTANEOUS

## 2016-10-21 MED ORDER — INSULIN ASPART 100 UNIT/ML ~~LOC~~ SOLN
0.0000 [IU] | SUBCUTANEOUS | Status: DC
Start: 2016-10-21 — End: 2016-10-21
  Administered 2016-10-21: 4 [IU] via SUBCUTANEOUS
  Administered 2016-10-21: 2 [IU] via SUBCUTANEOUS

## 2016-10-21 MED ORDER — INSULIN ASPART 100 UNIT/ML ~~LOC~~ SOLN
0.0000 [IU] | Freq: Every day | SUBCUTANEOUS | Status: DC
  Administered 2016-10-21: 3 [IU] via SUBCUTANEOUS

## 2016-10-21 MED ORDER — INSULIN DETEMIR 100 UNIT/ML ~~LOC~~ SOLN
20.0000 [IU] | Freq: Every day | SUBCUTANEOUS | Status: DC
  Administered 2016-10-21: 20 [IU] via SUBCUTANEOUS
  Filled 2016-10-21: qty 0.2

## 2016-10-21 MED ORDER — INSULIN DETEMIR 100 UNIT/ML ~~LOC~~ SOLN
20.0000 [IU] | SUBCUTANEOUS | Status: AC
  Administered 2016-10-21: 20 [IU] via SUBCUTANEOUS
  Filled 2016-10-21: qty 0.2

## 2016-10-21 MED ORDER — LISINOPRIL 5 MG PO TABS
2.5000 mg | ORAL_TABLET | Freq: Every day | ORAL | Status: DC
  Administered 2016-10-21 – 2016-10-22 (×2): 2.5 mg via ORAL
  Filled 2016-10-21 (×2): qty 1

## 2016-10-21 MED ORDER — LIVING WELL WITH DIABETES BOOK
Freq: Once | Status: AC
  Administered 2016-10-21: 1
  Filled 2016-10-21 (×2): qty 1

## 2016-10-21 MED ORDER — INSULIN STARTER KIT- PEN NEEDLES (ENGLISH)
1.0000 | Freq: Once | Status: AC
  Administered 2016-10-21: 1
  Filled 2016-10-21 (×2): qty 1

## 2016-10-21 NOTE — Progress Notes (Signed)
PROGRESS NOTE    Edward Walls  ZOX:[id number] DOB: [birth year] DOA: 10/20/2016 PCP: No PCP Per Patient    Brief Narrative:  36 y.o. male with no significant past medical history who presents with one week hx of polyuria, feeling of dehydration, increased tiredness, blurry vision, increased hunger. While at work, patient was seen by RN who noted a blood glucose that read "Hi." Patient was referred to ED for further work up. On further questioning, patient reports feeling "like I was about to die" while at work on the day of hospital admission. Patient reports subjective sob with increased respirations. Denies chest pains. Does report some nausea without vomiting. Patient does report family history of diabetes, namely with his mother and brother  In the ED, patient was noted to have glucose of 867 with anion gap of 23. PH noted to be 7.21 with serum bicarb of 14. Insulin gtt was started. Patient was started on IVF hydration. Hospitalist consulted for consideration for admission.  Assessment & Plan:   Principal Problem:   DKA (diabetic ketoacidoses) (HCC) Active Problems:   Hyperglycemia   Diabetes mellitus type 2 with ketoacidosis, uncontrolled (HCC)   Metabolic acidosis    1. DKA 1. Presenting glucose over 800 with elevated anion gap 2. DKA resolved with aggressive IVF 3. Insulin gtt weaned to off and pt is now on 20 units levemir with SSI coverage 4. Diabetic education done by diabetic coordinator 5. A1c reviewed, noted to be 12 6. Will likely require insulin 7. Glucose noted to be the 250's. Have added pre-meal aspart at 5 units 8. Have added low dose lisinopril to minimize risk of diabetic nephropathy 9. Repeat renal panel in AM 2. DM 1. New diagnosis 2. Weaned off insulin gtt 3. Continue to titrate insulin as tolerated 4. Overall improved 3. Metabolic acidosis 1. Secondary to DKA 2. Much improved 3. Repeat bmet in AM  DVT prophylaxis: Lovenox subQ Code Status:  Full Family Communication: Pt in room, family not at bedside Disposition Plan: Possible d/c home in 24hrs  Consultants:   Diabetic coordinator  Procedures:     Antimicrobials: Anti-infectives    None       Subjective: Feels better today  Objective: Vitals:   10/21/16 0800 10/21/16 0900 10/21/16 1000 10/21/16 1054  BP: (!) 144/102 124/71 (!) 166/75 132/68  Pulse: 75 65 (!) 114 71  Resp: 19 14 20 18   Temp: 97.7 F (36.5 C)   98.7 F (37.1 C)  TempSrc: Oral   Oral  SpO2: 98% 96% 100% 99%  Weight:      Height:        Intake/Output Summary (Last 24 hours) at 10/21/16 1716 Last data filed at 10/21/16 1000  Gross per 24 hour  Intake          1924.77 ml  Output              900 ml  Net          1024.77 ml   Filed Weights   10/20/16 1800  Weight: 95.3 kg (210 lb 1.6 oz)    Examination:  General exam: Appears calm and comfortable  Respiratory system: Clear to auscultation. Respiratory effort normal. Cardiovascular system: S1 & S2 heard, RRR Gastrointestinal system: Abdomen is nondistended, soft and nontender. No organomegaly or masses felt. Normal bowel sounds heard. Central nervous system: Alert and oriented. No focal neurological deficits. Extremities: Symmetric 5 x 5 power. Skin: No rashes, lesions Psychiatry: Judgement and insight appear normal. Mood &  affect appropriate.   Data Reviewed: I have personally reviewed following labs and imaging studies  CBC:  Recent Labs Lab 10/20/16 1448 10/20/16 1807 10/21/16 0334  WBC 6.7 7.1 5.1  HGB 15.2 13.1 12.4*  HCT 43.9 39.4 37.2*  MCV 81.0 80.4 80.9  PLT 224 200 200   Basic Metabolic Panel:  Recent Labs Lab 10/20/16 1448 10/20/16 1807 10/20/16 2125 10/21/16 0105 10/21/16 0334  NA 127* 136 136 137 135  K 4.6 4.3 3.9 3.7 3.5  CL 90* 103 106 108 107  CO2 14* 17* 20* 21* 21*  GLUCOSE 867* 472* 295* 183* 168*  BUN 21* 17 14 11 11   CREATININE 1.34* 1.06 0.96 0.72 0.76  CALCIUM 9.7 8.8* 8.5* 8.2*  8.0*   GFR: Estimated Creatinine Clearance: 157 mL/min (by C-G formula based on SCr of 0.76 mg/dL). Liver Function Tests:  Recent Labs Lab 10/21/16 0334  AST 23  ALT 26  ALKPHOS 55  BILITOT 1.0  PROT 5.9*  ALBUMIN 3.5   No results for input(s): LIPASE, AMYLASE in the last 168 hours. No results for input(s): AMMONIA in the last 168 hours. Coagulation Profile: No results for input(s): INR, PROTIME in the last 168 hours. Cardiac Enzymes: No results for input(s): CKTOTAL, CKMB, CKMBINDEX, TROPONINI in the last 168 hours. BNP (last 3 results) No results for input(s): PROBNP in the last 8760 hours. HbA1C:  Recent Labs  10/20/16 1807  HGBA1C 12.0*   CBG:  Recent Labs Lab 10/21/16 0259 10/21/16 0400 10/21/16 0434 10/21/16 0734 10/21/16 1123  GLUCAP 149* 156* 166* 141* 258*   Lipid Profile: No results for input(s): CHOL, HDL, LDLCALC, TRIG, CHOLHDL, LDLDIRECT in the last 72 hours. Thyroid Function Tests: No results for input(s): TSH, T4TOTAL, FREET4, T3FREE, THYROIDAB in the last 72 hours. Anemia Panel: No results for input(s): VITAMINB12, FOLATE, FERRITIN, TIBC, IRON, RETICCTPCT in the last 72 hours. Sepsis Labs: No results for input(s): PROCALCITON, LATICACIDVEN in the last 168 hours.  Recent Results (from the past 240 hour(s))  MRSA PCR Screening     Status: None   Collection Time: 10/20/16  5:53 PM  Result Value Ref Range Status   MRSA by PCR NEGATIVE NEGATIVE Final    Comment:        The GeneXpert MRSA Assay (FDA approved for NASAL specimens only), is one component of a comprehensive MRSA colonization surveillance program. It is not intended to diagnose MRSA infection nor to guide or monitor treatment for MRSA infections.      Radiology Studies: No results found.  Scheduled Meds: . enoxaparin (LOVENOX) injection  40 mg Subcutaneous Q24H  . insulin aspart  0-15 Units Subcutaneous TID WC  . insulin aspart  0-5 Units Subcutaneous QHS  . insulin  aspart  5 Units Subcutaneous TID WC  . insulin detemir  20 Units Subcutaneous QHS  . lisinopril  2.5 mg Oral Daily  . sodium chloride flush  3 mL Intravenous Q12H   Continuous Infusions: . sodium chloride 100 mL/hr at 10/21/16 1450     LOS: 0 days   Kawana Hegel, Scheryl MartenSTEPHEN K, MD [facility] Pager [phone]  If 7PM-7AM, please contact night-coverage [email or web address] Password [email or web address] 10/21/2016, 5:16 PM

## 2016-10-21 NOTE — Progress Notes (Signed)
Spoke with patient by phone regarding diabetes and new diagnosis.  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  He admits to not eating healthy when he works nights.  He primarily drinks ginger ale and juice.  Discussed cutting out sugar from beverages and drinking more water. Explained results of A1C and that average blood sugar 298 mg/dL. Explained that he would likely need insulin at discharge.  He currently does not have a PCP.  Needs PCP and possibly a endocrinologist after d/c?  Told patient that "Living well with diabetes" booklet has been ordered and that goals for today are for him to administer insulin and to learn to check blood sugars.   Patient appreciative of information.  He will watch the videos today and I will follow-up with patient on 10/22/16 regarding questions.  Thanks, Beryl MeagerJenny Damieon Armendariz, RN, BC-ADM Inpatient Diabetes Coordinator Pager [phone] (8a-5p)

## 2016-10-21 NOTE — Care Management Note (Signed)
Case Management Note  Patient Details  Name: Edward Walls MRN: [id number] Date of Birth: 01/11/1981  Subjective/Objective:     DKA, new diagnosed DM type II               Action/Plan: Discharge Planning: NCM spoke to pt and states he has family history of DM. States he eats out quite frequently. States he exercises but recently sporadically. Discussed insulin and that NCM will provide a Levemir copay card. Will have attending escribe Rx to his pharmacy to check copay cost. Pt will need Rx for glucometer machine, and note for work. Explained pt to go to [employer] website to sign in and get list of providers or he can call toll free number on the back of his card. Once he call toll free number, the [employer] rep will provide a the name of a providers that are accepting new pts. Pt will call to arrange appt with PCP.    Expected Discharge Date:              Expected Discharge Plan:  Home/Self Care  In-House Referral:  NA  Discharge planning Services  CM Consult  Post Acute Care Choice:  NA Choice offered to:  NA  DME Arranged:  N/A DME Agency:  NA  HH Arranged:  NA HH Agency:  NA  Status of Service:  Completed, signed off  If discussed at Long Length of Stay Meetings, dates discussed:    Additional Comments:  Edward Walls, Edward Wiseman Ellen, RN 10/21/2016, 6:06 PM

## 2016-10-21 NOTE — Progress Notes (Signed)
Inpatient Diabetes Program Recommendations  AACE/ADA: New Consensus Statement on Inpatient Glycemic Control (2015)  Target Ranges:  Prepandial:   less than 140 mg/dL      Peak postprandial:   less than 180 mg/dL (1-2 hours)      Critically ill patients:  140 - 180 mg/dL   Lab Results  Component Value Date   GLUCAP 141 (H) 10/21/2016   HGBA1C 12.0 (H) 10/20/2016    Review of Glycemic Control:  Results for Edward Walls, [place] A (MRN [id number]) as of 10/21/2016 10:45  Ref. Range 10/20/2016 22:39 10/20/2016 23:45 10/21/2016 07:34  Glucose-Capillary Latest Ref Range: 65 - 99 mg/dL 409157 (H) 811169 (H) 914141 (H)    Diabetes history: New onset diabetes Outpatient Diabetes medications: None Current orders for Inpatient glycemic control:  Levemir 20 units q HS, Novolog TCTS q 4 hours  Inpatient Diabetes Program Recommendations:    Call received from RN regarding new diagnosis of diabetes.  Placed orders for diabetes education.  Will need insulin education as well by bedside RN.  Also placed order/cosign required for outpatient diabetes education follow-up.  Will call and speak to patient by phone to review diabetes survival skills, PCP?, and answer any questions.   Thanks, Beryl MeagerJenny Lashanna Angelo, RN, BC-ADM Inpatient Diabetes Coordinator Pager [phone] (8A-5P)

## 2016-10-22 DIAGNOSIS — R739 Hyperglycemia, unspecified: Secondary | ICD-10-CM | POA: Diagnosis not present

## 2016-10-22 DIAGNOSIS — E872 Acidosis: Secondary | ICD-10-CM | POA: Diagnosis not present

## 2016-10-22 DIAGNOSIS — E1310 Other specified diabetes mellitus with ketoacidosis without coma: Secondary | ICD-10-CM | POA: Diagnosis not present

## 2016-10-22 LAB — BASIC METABOLIC PANEL
Anion gap: 9 (ref 5–15)
BUN: 10 mg/dL (ref 6–20)
CALCIUM: 8.2 mg/dL — AB (ref 8.9–10.3)
CO2: 22 mmol/L (ref 22–32)
CREATININE: 0.57 mg/dL — AB (ref 0.61–1.24)
Chloride: 104 mmol/L (ref 101–111)
GFR calc non Af Amer: >60 mL/min (ref >60)
Glucose, Bld: 196 mg/dL — ABNORMAL HIGH (ref 65–99)
Potassium: 3.3 mmol/L — ABNORMAL LOW (ref 3.5–5.1)
SODIUM: 135 mmol/L (ref 135–145)

## 2016-10-22 LAB — GLUCOSE, CAPILLARY
GLUCOSE-CAPILLARY: 189 mg/dL — AB (ref 65–99)
GLUCOSE-CAPILLARY: 281 mg/dL — AB (ref 65–99)

## 2016-10-22 MED ORDER — INSULIN DETEMIR 100 UNIT/ML FLEXPEN: 25.0000 [IU] | PEN_INJECTOR | Freq: Every day | SUBCUTANEOUS | 0 refills | Status: DC

## 2016-10-22 MED ORDER — BLOOD GLUCOSE MONITOR KIT: PACK | 0 refills | Status: DC

## 2016-10-22 MED ORDER — INSULIN DETEMIR 100 UNIT/ML ~~LOC~~ SOLN
25.0000 [IU] | Freq: Every day | SUBCUTANEOUS | Status: DC
  Filled 2016-10-22: qty 0.25

## 2016-10-22 MED ORDER — INSULIN ASPART 100 UNIT/ML FLEXPEN: 5.0000 [IU] | PEN_INJECTOR | Freq: Three times a day (TID) | SUBCUTANEOUS | 0 refills | Status: DC

## 2016-10-22 MED ORDER — POTASSIUM CHLORIDE CRYS ER 20 MEQ PO TBCR
40.0000 meq | EXTENDED_RELEASE_TABLET | Freq: Two times a day (BID) | ORAL | Status: DC
  Administered 2016-10-22: 40 meq via ORAL
  Filled 2016-10-22: qty 2

## 2016-10-22 MED ORDER — INSULIN PEN NEEDLE 31G X 5 MM MISC: 1.0000 | Freq: Four times a day (QID) | 0 refills | Status: DC

## 2016-10-22 MED ORDER — INSULIN LISPRO 100 UNIT/ML (KWIKPEN): 5.0000 [IU] | PEN_INJECTOR | Freq: Three times a day (TID) | SUBCUTANEOUS | 0 refills | Status: DC

## 2016-10-22 MED ORDER — LISINOPRIL 2.5 MG PO TABS: 2.5000 mg | ORAL_TABLET | Freq: Every day | ORAL | 0 refills | Status: DC

## 2016-10-22 NOTE — Progress Notes (Addendum)
Provided pt with Levemir and Novolog copay card. Faxed Rx to [facility]. Will need copay cost or see if prior auth needed.   1547 Contacted [facility]. Spoke to pharmacist and Emerson Electricovolog Flexpen are not covered by insurance. They are requesting pt use Humalog Flexpen. Levemir copay $141 and needles $41. Pt will pay $25 with copay card, and Lisinopril $2.00. Contacted attending to make aware.  Isidoro DonningAlesia Saraia Platner RN CCM Case Mgmt phone [phone]

## 2016-10-22 NOTE — Discharge Summary (Addendum)
Physician Discharge Summary  Edward Walls MVE:[id number] DOB: 1981-06-30 DOA: 10/20/2016  PCP: No PCP Per Patient  Admit date: 10/20/2016 Discharge date: 10/22/2016  Admitted From: Home Disposition:  home  Recommendations for Outpatient Follow-up:  1. Follow up with PCP in 1-2 weeks 2. Please titrate insulin to ensure euglycemia   Discharge Condition:Improved CODE STATUS:Full Diet recommendation: diabetic   Brief/Interim Summary: 36 y.o.malewith no significant past medical history who presents with one week hx of polyuria, feeling of dehydration, increased tiredness, blurry vision, increased hunger. While at work, patient was seen by RN who noted a blood glucose that read "Hi." Patient was referred to ED for further work up. On further questioning, patient reports feeling "like I was about to die" while at work on the day of hospital admission. Patient reports subjective sob with increased respirations. Denies chest pains. Does report some nausea without vomiting. Patient does report family history of diabetes, namely with his mother and brother  In the ED, patient was noted to have glucose of 867 with anion gap of 23. PH noted to be 7.21 with serum bicarb of 14. Insulin gtt was started. Patient was started on IVF hydration. Hospitalist consulted for consideration for admission.   1. DKA 1. Presenting glucose over 800 with elevated anion gap 2. DKA resolved with aggressive IVF 3. Insulin gtt weaned to off and pt is now on 20 units levemir with SSI coverage 4. Diabetic education done by diabetic coordinator 5. A1c reviewed, noted to be 12 6. Have added low dose lisinopril to minimize risk of diabetic nephropathy 7. At time of discharge, patient continued on 25 units of levemir and 5 units of aspart before meals. Recommend dose adjustment as needed as outpatient 2. DM 1. New diagnosis 2. Weaned off insulin gtt 3. Continue to titrate insulin as tolerated per above 3. Metabolic  acidosis 1. Secondary to DKA 2. Resolved  Discharge Diagnoses:  Principal Problem:   DKA (diabetic ketoacidoses) ([place]) Active Problems:   Hyperglycemia   Diabetes mellitus type 2 with ketoacidosis, uncontrolled ([place])   Metabolic acidosis    Discharge Instructions  Discharge Instructions    Ambulatory referral to Nutrition and Diabetic Education    Complete by:  As directed    New onset diabetes-A1C=12%     Allergies as of 10/22/2016      Reactions   Peanut-containing Drug Products Other (See Comments)   Throat burning       Medication List    TAKE these medications   blood glucose meter kit and supplies Kit Dispense based on patient and insurance preference. Use up to four times daily as directed. (FOR ICD-9 250.00, 250.01).   ibuprofen 200 MG tablet Commonly known as:  ADVIL,MOTRIN Take 400 mg by mouth every 6 (six) hours as needed for headache, mild pain or moderate pain.   Insulin Detemir 100 UNIT/ML Pen Commonly known as:  LEVEMIR FLEXPEN Inject 25 Units into the skin daily at 10 pm.   insulin lispro 100 UNIT/ML KiwkPen Commonly known as:  HUMALOG KWIKPEN Inject 0.05 mLs (5 Units total) into the skin 3 (three) times daily.   Insulin Pen Needle 31G X 5 MM Misc 1 Device by Does not apply route QID.   lisinopril 2.5 MG tablet Commonly known as:  PRINIVIL,ZESTRIL Take 1 tablet (2.5 mg total) by mouth daily. Start taking on:  10/23/2016   polyethylene glycol powder powder Commonly known as:  MIRALAX TAKE 6 CAPFULS OF MIRALAX IN A 32 OUNCE [place]  THE WHOLE BEVERAGE FOLLOWED BY 3 CAPFULS TWICE A DAY FOR THE NEXT WEEK AND FOLLOW UP WITH YOUR PRIMARY CARE PHYSICIAN.      Follow-up Information    Please establish and follow up with PCP. Schedule an appointment as soon as possible for a visit in 1 week(s).          Allergies  Allergen Reactions  . Peanut-Containing Drug Products Other (See Comments)    Throat burning      Consultations:  Diabetic Coordinator  Procedures/Studies: No results found.  Subjective: No complaints  Discharge Exam: Vitals:   10/22/16 0455 10/22/16 1416  BP: 135/70 126/62  Pulse: 72 76  Resp: 17 18  Temp: 97.4 F (36.3 C) 98.2 F (36.8 C)   Vitals:   10/21/16 1054 10/21/16 2114 10/22/16 0455 10/22/16 1416  BP: 132/68 121/64 135/70 126/62  Pulse: 71 72 72 76  Resp: '18 16 17 18  ' Temp: 98.7 F (37.1 C) 98.6 F (37 C) 97.4 F (36.3 C) 98.2 F (36.8 C)  TempSrc: Oral Oral Oral Oral  SpO2: 99% 100% 100% 97%  Weight:      Height:        General: Pt is alert, awake, not in acute distress Cardiovascular: RRR, S1/S2 +, no rubs, no gallops Respiratory: CTA bilaterally, no wheezing, no rhonchi Abdominal: Soft, NT, ND, bowel sounds + Extremities: no edema, no cyanosis   The results of significant diagnostics from this hospitalization (including imaging, microbiology, ancillary and laboratory) are listed below for reference.     Microbiology: Recent Results (from the past 240 hour(s))  MRSA PCR Screening     Status: None   Collection Time: 10/20/16  5:53 PM  Result Value Ref Range Status   MRSA by PCR NEGATIVE NEGATIVE Final    Comment:        The GeneXpert MRSA Assay (FDA approved for NASAL specimens only), is one component of a comprehensive MRSA colonization surveillance program. It is not intended to diagnose MRSA infection nor to guide or monitor treatment for MRSA infections.      Labs: BNP (last 3 results) No results for input(s): BNP in the last 8760 hours. Basic Metabolic Panel:  Recent Labs Lab 10/20/16 1807 10/20/16 2125 10/21/16 0105 10/21/16 0334 10/22/16 0613  NA 136 136 137 135 135  K 4.3 3.9 3.7 3.5 3.3*  CL 103 106 108 107 104  CO2 17* 20* 21* 21* 22  GLUCOSE 472* 295* 183* 168* 196*  BUN '17 14 11 11 10  ' CREATININE 1.06 0.96 0.72 0.76 0.57*  CALCIUM 8.8* 8.5* 8.2* 8.0* 8.2*   Liver Function Tests:  Recent Labs Lab  10/21/16 0334  AST 23  ALT 26  ALKPHOS 55  BILITOT 1.0  PROT 5.9*  ALBUMIN 3.5   No results for input(s): LIPASE, AMYLASE in the last 168 hours. No results for input(s): AMMONIA in the last 168 hours. CBC:  Recent Labs Lab 10/20/16 1448 10/20/16 1807 10/21/16 0334  WBC 6.7 7.1 5.1  HGB 15.2 13.1 12.4*  HCT 43.9 39.4 37.2*  MCV 81.0 80.4 80.9  PLT 224 200 200   Cardiac Enzymes: No results for input(s): CKTOTAL, CKMB, CKMBINDEX, TROPONINI in the last 168 hours. BNP: Invalid input(s): POCBNP CBG:  Recent Labs Lab 10/21/16 1123 10/21/16 1740 10/21/16 2109 10/22/16 0731 10/22/16 1149  GLUCAP 258* 338* 300* 189* 281*   D-Dimer No results for input(s): DDIMER in the last 72 hours. Hgb A1c  Recent Labs  10/20/16 1807  HGBA1C 12.0*   Lipid Profile No results for input(s): CHOL, HDL, LDLCALC, TRIG, CHOLHDL, LDLDIRECT in the last 72 hours. Thyroid function studies No results for input(s): TSH, T4TOTAL, T3FREE, THYROIDAB in the last 72 hours.  Invalid input(s): FREET3 Anemia work up No results for input(s): VITAMINB12, FOLATE, FERRITIN, TIBC, IRON, RETICCTPCT in the last 72 hours. Urinalysis    Component Value Date/Time   COLORURINE COLORLESS (A) 10/20/2016 1436   APPEARANCEUR CLEAR 10/20/2016 1436   LABSPEC 1.025 10/20/2016 1436   PHURINE 5.0 10/20/2016 1436   GLUCOSEU >=500 (A) 10/20/2016 1436   HGBUR NEGATIVE 10/20/2016 1436   BILIRUBINUR NEGATIVE 10/20/2016 1436   KETONESUR 80 (A) 10/20/2016 1436   PROTEINUR NEGATIVE 10/20/2016 1436   NITRITE NEGATIVE 10/20/2016 1436   LEUKOCYTESUR NEGATIVE 10/20/2016 1436   Sepsis Labs Invalid input(s): PROCALCITONIN,  WBC,  LACTICIDVEN Microbiology Recent Results (from the past 240 hour(s))  MRSA PCR Screening     Status: None   Collection Time: 10/20/16  5:53 PM  Result Value Ref Range Status   MRSA by PCR NEGATIVE NEGATIVE Final    Comment:        The GeneXpert MRSA Assay (FDA approved for NASAL  specimens only), is one component of a comprehensive MRSA colonization surveillance program. It is not intended to diagnose MRSA infection nor to guide or monitor treatment for MRSA infections.      SIGNED:   Donne Hazel, MD  [facility] 10/22/2016, 3:50 PM  If 7PM-7AM, please contact night-coverage [email or web address] Password [email or web address]

## 2016-10-22 NOTE — Progress Notes (Addendum)
Spoke with RN.  She has done an excellent job assisting patient with watching videos and teaching insulin administration.  ? Whether patient will d/c on insulin pens.  I explained that this is likely and more convenient way of administering insulin.  RN to review insulin pen starter kit with patient and use of insulin pen.  Spoke with patient by phone.  He asked if he had Type 1 or Type 2 diabetes.  I recommended that he discuss with MD and explained the difference between the two.  Further I discussed hypoglycemia, treatment, and 15/15 rule.  He has been watching videos and understands the importance of follow-up with PCP and long-term complications.    MD, please consider ordering Levemir flexpen, Novolog Flexpen, and insulin pen needles [id number]).  He will also need glucose meter kit (30047). May also consider addition of oral agent such as Metformin if appropriate? Explained that he needs follow-up visit ASAP with PCP.   Thanks, Adah Perl, RN, BC-ADM Inpatient Diabetes Coordinator Pager [phone] (8a-5p)

## 2016-10-24 ENCOUNTER — Encounter: Payer: Self-pay | Admitting: Dietician

## 2016-10-24 ENCOUNTER — Encounter: Payer: Managed Care, Other (non HMO) | Attending: "Endocrinology | Admitting: Dietician

## 2016-10-24 DIAGNOSIS — Z713 Dietary counseling and surveillance: Secondary | ICD-10-CM | POA: Diagnosis not present

## 2016-10-24 DIAGNOSIS — E1110 Type 2 diabetes mellitus with ketoacidosis without coma: Secondary | ICD-10-CM | POA: Diagnosis present

## 2016-10-24 DIAGNOSIS — Z6826 Body mass index (BMI) 26.0-26.9, adult: Secondary | ICD-10-CM | POA: Diagnosis not present

## 2016-10-24 NOTE — Progress Notes (Signed)
Diabetes Self-Management Education  Visit Type: First/Initial  Appt. Start Time: 0845 Appt. End Time: 1045 Patient arrived 45 minutes late.  Stated that he took his meal time insulin but did not have time to eat.  Food provided in our office.  Patient had no symptoms of hypoglycemia.  Symptoms and risks discussed along with proper treatment.  10/24/2016  [name], identified by name and date of birth, is a 36 y.o. male with a diagnosis of Diabetes: Type 2. He was diagnosed 10/20/16 and hospitalized.  He has no primary physician and was told at the hospital to make an appointment with an endocrinologist.  He reports that weight was 225 lbs 2 weeks ago and 210 lbs when hospitalized and today.  He had symptoms of increased thirst and fatigue leading him to the ER.  He takes Humalog 5 units with each meal and 25 units of Levemir q HS.  He will check his blood sugar when he picks up his meter and states that he knows how.  His mother, father, and 4 brothers all have diabetes.  UBW 180 lbs until 1 year ago when he got a promotion and desk job at work.  Patient lives with his girlfriend and son.  He has 2 sons ages 42 and 72.  His girlfriend does most of the shopping and all of the cooking.  He works 2 jobs.  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  On the days he works both jobs, sleep is limited to about 5 hours maximum.  He reports ETOH use 1 pint one time a week but felt very dehydrated about 2 weeks ago and quit.    ASSESSMENT  Height 6\' 3"  (1.905 m), weight 210 lb (95.3 kg). Body mass index is 26.25 kg/m.      Diabetes Self-Management Education - 10/24/16 0855      Visit Information   Visit Type First/Initial     Initial Visit   Diabetes Type Type 2   Are you currently following a meal plan? No   Are you taking your medications as prescribed? Yes   Date Diagnosed 10/20/16     Health Coping    How would you rate your overall health? Good     Psychosocial Assessment   Patient Belief/Attitude about Diabetes Motivated to manage diabetes   Self-care barriers None   Self-management support Doctor's office;Family;Friends   Other persons present Patient   Patient Concerns Nutrition/Meal planning   Special Needs None   Preferred Learning Style No preference indicated   Learning Readiness Ready   How often do you need to have someone help you when you read instructions, pamphlets, or other written materials from your doctor or pharmacy? 1 - Never   What is the last grade level you completed in school? 12th grade     Pre-Education Assessment   Patient understands the diabetes disease and treatment process. Needs Instruction   Patient understands incorporating nutritional management into lifestyle. Needs Instruction   Patient undertands incorporating physical activity into lifestyle. Needs Instruction   Patient understands using medications safely. Needs Instruction   Patient understands monitoring blood glucose, interpreting and using results Needs Instruction   Patient understands prevention, detection, and treatment of acute complications. Needs Instruction   Patient understands prevention, detection, and treatment of chronic complications. Needs Instruction   Patient understands how to develop strategies to  address psychosocial issues. Needs Instruction   Patient understands how to develop strategies to promote health/change behavior. Needs Instruction     Complications   Last HgB A1C per patient/outside source 12 %  10/20/16   How often do you check your blood sugar? 0 times/day (not testing)  waiting for meter to come in the mail   Have you had a dilated eye exam in the past 12 months? No   Have you had a dental exam in the past 12 months? No   Are you checking your feet? No     Dietary Intake   Breakfast (3)eggs with cheese, (5 strips) bacon, pancakes or (6) waffles and OJ  prior to dx OR regular homade oatmeal with cinnamon, pineapple, eggs now   Snack (morning) none   Lunch salad (just vegetables) yesturday OR previously 2 burgers and fries and regular soda OR vegetable plate   Snack (afternoon) burger and fries   Dinner 2 pieces chicken, mashed potatoes, greens, regular soda, salad   Snack (evening) none   Beverage(s) water, flavored water, (was drinking a lot of regular soda but has quit)     Exercise   Exercise Type Light (walking / raking leaves)  prior to 2 weeks ago.  walks on treadmill for 45-60 minutes every other day   How many days per week to you exercise? 4   How many minutes per day do you exercise? 45   Total minutes per week of exercise 180     Patient Education   Previous Diabetes Education No   Disease state  Definition of diabetes, type 1 and 2, and the diagnosis of diabetes;Factors that contribute to the development of diabetes;Explored patient's options for treatment of their diabetes   Nutrition management  Role of diet in the treatment of diabetes and the relationship between the three main macronutrients and blood glucose level;Food label reading, portion sizes and measuring food.;Carbohydrate counting;Meal options for control of blood glucose level and chronic complications.;Meal timing in regards to the patients' current diabetes medication.;Effects of alcohol on blood glucose and safety factors with consumption of alcohol.   Physical activity and exercise  Role of exercise on diabetes management, blood pressure control and cardiac health.;Identified with patient nutritional and/or medication changes necessary with exercise.   Medications Reviewed patients medication for diabetes, action, purpose, timing of dose and side effects.   Monitoring Purpose and frequency of SMBG.;Identified appropriate SMBG and/or A1C goals.;Yearly dilated eye exam;Daily foot exams   Acute complications Taught treatment of hypoglycemia - the 15 rule.   Chronic  complications Relationship between chronic complications and blood glucose control;Assessed and discussed foot care and prevention of foot problems;Retinopathy and reason for yearly dilated eye exams;Dental care;Nephropathy, what it is, prevention of, the use of ACE, ARB's and early detection of through urine microalbumia.   Psychosocial adjustment Worked with patient to identify barriers to care and solutions;Helped patient identify a support system for diabetes management;Identified and addressed patients feelings and concerns about diabetes;Role of stress on diabetes     Individualized Goals (developed by patient)   Nutrition General guidelines for healthy choices and portions discussed   Physical Activity Exercise 3-5 times per week;60 minutes per day   Medications take my medication as prescribed   Monitoring  test my blood glucose as discussed   Reducing Risk examine blood glucose patterns;treat hypoglycemia with 15 grams of carbs if blood glucose less than 70mg /dL;do foot checks daily     Post-Education Assessment   Patient understands  the diabetes disease and treatment process. Demonstrates understanding / competency   Patient understands incorporating nutritional management into lifestyle. Demonstrates understanding / competency   Patient undertands incorporating physical activity into lifestyle. Demonstrates understanding / competency   Patient understands using medications safely. Demonstrates understanding / competency   Patient understands monitoring blood glucose, interpreting and using results Demonstrates understanding / competency   Patient understands prevention, detection, and treatment of acute complications. Demonstrates understanding / competency   Patient understands prevention, detection, and treatment of chronic complications. Demonstrates understanding / competency   Patient understands how to develop strategies to address psychosocial issues. Demonstrates understanding /  competency   Patient understands how to develop strategies to promote health/change behavior. Demonstrates understanding / competency     Outcomes   Expected Outcomes Demonstrated interest in learning. Expect positive outcomes   Future DMSE 4-6 wks   Program Status Completed      Individualized Plan for Diabetes Self-Management Training:   Learning Objective:  Patient will have a greater understanding of diabetes self-management. Patient education plan is to attend individual and/or group sessions per assessed needs and concerns.   Plan:   Patient Instructions  Keep glucose tabs in your car, bedside table, etc. Consider labeling your insulin pens (meal and bedtime). Avoid or limit alcohol to no more than 2 servings per day.  Recommend avoid.  Eat if you have alcohol. Continue to avoid beverages with carbohydrates. Avoid skipping meals.  Routine meal schedule is best.    Aim for 4-5 Carb Choices per meal (60-75 grams) +/- 1 either way  Aim for 0-2 Carbs per snack if hungry  Include protein in moderation with your meals and snacks Consider reading food labels for Total Carbohydrate and Fat Grams of foods Consider  increasing your activity level by walking or gym for 30-60 minutes daily as tolerated Consider checking BG at alternate times per day as directed by MD  Continue taking medication as directed by MD    Expected Outcomes:  Demonstrated interest in learning. Expect positive outcomes  Education material provided: Living Well with Diabetes, Food label handouts, A1C conversion sheet, Meal plan card, My Plate and Snack sheet  If problems or questions, patient to contact team via:  Phone and Email  Future DSME appointment: 4-6 wks

## 2016-10-24 NOTE — Patient Instructions (Addendum)
Keep glucose tabs in your car, bedside table, etc. Consider labeling your insulin pens (meal and bedtime). Avoid or limit alcohol to no more than 2 servings per day.  Recommend avoid.  Eat if you have alcohol. Continue to avoid beverages with carbohydrates. Avoid skipping meals.  Routine meal schedule is best.    Aim for 4-5 Carb Choices per meal (60-75 grams) +/- 1 either way  Aim for 0-2 Carbs per snack if hungry  Include protein in moderation with your meals and snacks Consider reading food labels for Total Carbohydrate and Fat Grams of foods Consider  increasing your activity level by walking or gym for 30-60 minutes daily as tolerated Consider checking BG at alternate times per day as directed by MD  Continue taking medication as directed by MD

## 2016-11-28 ENCOUNTER — Ambulatory Visit: Payer: Managed Care, Other (non HMO) | Admitting: Dietician

## 2017-07-03 ENCOUNTER — Emergency Department (HOSPITAL_COMMUNITY): Payer: Managed Care, Other (non HMO)

## 2017-07-03 ENCOUNTER — Inpatient Hospital Stay (HOSPITAL_COMMUNITY)
Admission: EM | Admit: 2017-07-03 | Discharge: 2017-07-06 | DRG: 638 | Disposition: A | Discharge status: Home / Self Care | Payer: Managed Care, Other (non HMO) | Attending: Internal Medicine | Admitting: Internal Medicine

## 2017-07-03 ENCOUNTER — Encounter (HOSPITAL_COMMUNITY): Payer: Self-pay | Admitting: Emergency Medicine

## 2017-07-03 DIAGNOSIS — Z9114 Patient's other noncompliance with medication regimen: Secondary | ICD-10-CM | POA: Diagnosis not present

## 2017-07-03 DIAGNOSIS — Z833 Family history of diabetes mellitus: Secondary | ICD-10-CM | POA: Diagnosis not present

## 2017-07-03 DIAGNOSIS — R1084 Generalized abdominal pain: Secondary | ICD-10-CM | POA: Diagnosis not present

## 2017-07-03 DIAGNOSIS — E1010 Type 1 diabetes mellitus with ketoacidosis without coma: Secondary | ICD-10-CM

## 2017-07-03 DIAGNOSIS — E1310 Other specified diabetes mellitus with ketoacidosis without coma: Secondary | ICD-10-CM | POA: Diagnosis not present

## 2017-07-03 DIAGNOSIS — R519 Headache, unspecified: Secondary | ICD-10-CM | POA: Diagnosis present

## 2017-07-03 DIAGNOSIS — E876 Hypokalemia: Secondary | ICD-10-CM | POA: Diagnosis present

## 2017-07-03 DIAGNOSIS — R634 Abnormal weight loss: Secondary | ICD-10-CM | POA: Diagnosis present

## 2017-07-03 DIAGNOSIS — E860 Dehydration: Secondary | ICD-10-CM | POA: Diagnosis present

## 2017-07-03 DIAGNOSIS — R51 Headache: Secondary | ICD-10-CM | POA: Diagnosis not present

## 2017-07-03 DIAGNOSIS — E1110 Type 2 diabetes mellitus with ketoacidosis without coma: Principal | ICD-10-CM | POA: Diagnosis present

## 2017-07-03 DIAGNOSIS — Z6820 Body mass index (BMI) 20.0-20.9, adult: Secondary | ICD-10-CM | POA: Diagnosis not present

## 2017-07-03 DIAGNOSIS — R112 Nausea with vomiting, unspecified: Secondary | ICD-10-CM | POA: Diagnosis present

## 2017-07-03 DIAGNOSIS — N179 Acute kidney failure, unspecified: Secondary | ICD-10-CM | POA: Diagnosis present

## 2017-07-03 DIAGNOSIS — R109 Unspecified abdominal pain: Secondary | ICD-10-CM | POA: Diagnosis present

## 2017-07-03 LAB — URINALYSIS, ROUTINE W REFLEX MICROSCOPIC
Bacteria, UA: NONE SEEN
Bilirubin Urine: NEGATIVE
KETONES UR: 80 mg/dL — AB
Leukocytes, UA: NEGATIVE
Nitrite: NEGATIVE
PH: 5 (ref 5.0–8.0)
Protein, ur: 100 mg/dL — AB
SQUAMOUS EPITHELIAL / LPF: NONE SEEN
Specific Gravity, Urine: 1.021 (ref 1.005–1.030)

## 2017-07-03 LAB — BASIC METABOLIC PANEL
Anion gap: 25 — ABNORMAL HIGH (ref 5–15)
BUN: 14 mg/dL (ref 6–20)
CALCIUM: 8.5 mg/dL — AB (ref 8.9–10.3)
CO2: 8 mmol/L — AB (ref 22–32)
CREATININE: 1.54 mg/dL — AB (ref 0.61–1.24)
Chloride: 108 mmol/L (ref 101–111)
GFR calc Af Amer: >60 mL/min (ref >60)
GFR calc non Af Amer: 57 mL/min — ABNORMAL LOW (ref >60)
GLUCOSE: 284 mg/dL — AB (ref 65–99)
Potassium: 4.9 mmol/L (ref 3.5–5.1)
Sodium: 141 mmol/L (ref 135–145)

## 2017-07-03 LAB — CBC WITH DIFFERENTIAL/PLATELET
BASOS PCT: 0 %
Basophils Absolute: 0 10*3/uL (ref 0.0–0.1)
EOS ABS: 0 10*3/uL (ref 0.0–0.7)
EOS PCT: 0 %
HCT: 48.9 % (ref 39.0–52.0)
Hemoglobin: 16 g/dL (ref 13.0–17.0)
LYMPHS PCT: 17 %
Lymphs Abs: 1.5 10*3/uL (ref 0.7–4.0)
MCH: 27.9 pg (ref 26.0–34.0)
MCHC: 32.7 g/dL (ref 30.0–36.0)
MCV: 85.3 fL (ref 78.0–100.0)
Monocytes Absolute: 0.8 10*3/uL (ref 0.1–1.0)
Monocytes Relative: 10 %
Neutro Abs: 6.3 10*3/uL (ref 1.7–7.7)
Neutrophils Relative %: 73 %
PLATELETS: 251 10*3/uL (ref 150–400)
RBC: 5.73 MIL/uL (ref 4.22–5.81)
RDW: 13.4 % (ref 11.5–15.5)
WBC: 8.6 10*3/uL (ref 4.0–10.5)

## 2017-07-03 LAB — GLUCOSE, CAPILLARY
GLUCOSE-CAPILLARY: 469 mg/dL — AB (ref 65–99)
GLUCOSE-CAPILLARY: 421 mg/dL — AB (ref 65–99)
Glucose-Capillary: 257 mg/dL — ABNORMAL HIGH (ref 65–99)
Glucose-Capillary: 233 mg/dL — ABNORMAL HIGH (ref 65–99)

## 2017-07-03 LAB — COMPREHENSIVE METABOLIC PANEL
ALK PHOS: 98 U/L (ref 38–126)
ALT: 25 U/L (ref 17–63)
AST: 25 U/L (ref 15–41)
Albumin: 5 g/dL (ref 3.5–5.0)
Anion gap: 26 — ABNORMAL HIGH (ref 5–15)
BILIRUBIN TOTAL: 1.5 mg/dL — AB (ref 0.3–1.2)
BUN: 15 mg/dL (ref 6–20)
CALCIUM: 9.3 mg/dL (ref 8.9–10.3)
CO2: 11 mmol/L — ABNORMAL LOW (ref 22–32)
CREATININE: 1.49 mg/dL — AB (ref 0.61–1.24)
Chloride: 96 mmol/L — ABNORMAL LOW (ref 101–111)
GFR calc Af Amer: >60 mL/min (ref >60)
GFR, EST NON AFRICAN AMERICAN: 60 mL/min — AB (ref >60)
Glucose, Bld: 382 mg/dL — ABNORMAL HIGH (ref 65–99)
POTASSIUM: 5 mmol/L (ref 3.5–5.1)
Sodium: 133 mmol/L — ABNORMAL LOW (ref 135–145)
TOTAL PROTEIN: 8.7 g/dL — AB (ref 6.5–8.1)

## 2017-07-03 LAB — LIPASE, BLOOD: LIPASE: 27 U/L (ref 11–51)

## 2017-07-03 LAB — CBG MONITORING, ED: Glucose-Capillary: 349 mg/dL — ABNORMAL HIGH (ref 65–99)

## 2017-07-03 LAB — CG4 I-STAT (LACTIC ACID): Lactic Acid, Venous: 1.28 mmol/L (ref 0.5–1.9)

## 2017-07-03 LAB — CREATININE, URINE, RANDOM: Creatinine, Urine: 48.69 mg/dL

## 2017-07-03 LAB — TROPONIN I

## 2017-07-03 LAB — SODIUM, URINE, RANDOM: Sodium, Ur: 96 mmol/L

## 2017-07-03 MED ORDER — SODIUM CHLORIDE 0.9 % IV SOLN
INTRAVENOUS | Status: DC
  Administered 2017-07-03: 23:00:00 via INTRAVENOUS

## 2017-07-03 MED ORDER — SODIUM CHLORIDE 0.9 % IV SOLN
INTRAVENOUS | Status: DC
  Administered 2017-07-03: 4.1 [IU]/h via INTRAVENOUS
  Filled 2017-07-03: qty 1

## 2017-07-03 MED ORDER — SODIUM CHLORIDE 0.9 % IV SOLN
INTRAVENOUS | Status: DC
  Administered 2017-07-03: 20:00:00 via INTRAVENOUS

## 2017-07-03 MED ORDER — SODIUM CHLORIDE 0.9 % IV SOLN
INTRAVENOUS | Status: DC
  Administered 2017-07-04: 5.5 [IU]/h via INTRAVENOUS
  Administered 2017-07-04: 23:00:00 via INTRAVENOUS
  Filled 2017-07-03 (×3): qty 1

## 2017-07-03 MED ORDER — METOCLOPRAMIDE HCL 5 MG/ML IJ SOLN
10.0000 mg | Freq: Once | INTRAMUSCULAR | Status: AC
  Administered 2017-07-03: 10 mg via INTRAVENOUS
  Filled 2017-07-03: qty 2

## 2017-07-03 MED ORDER — ENOXAPARIN SODIUM 40 MG/0.4ML ~~LOC~~ SOLN
40.0000 mg | SUBCUTANEOUS | Status: DC
  Administered 2017-07-03 – 2017-07-05 (×3): 40 mg via SUBCUTANEOUS
  Filled 2017-07-03 (×3): qty 0.4

## 2017-07-03 MED ORDER — ZOLPIDEM TARTRATE 5 MG PO TABS: 5.0000 mg | ORAL_TABLET | Freq: Every evening | ORAL | Status: DC | PRN

## 2017-07-03 MED ORDER — DEXTROSE-NACL 5-0.45 % IV SOLN
INTRAVENOUS | Status: DC
  Administered 2017-07-04 – 2017-07-05 (×2): via INTRAVENOUS

## 2017-07-03 MED ORDER — POTASSIUM CHLORIDE 10 MEQ/100ML IV SOLN
10.0000 meq | INTRAVENOUS | Status: AC
  Filled 2017-07-03: qty 100

## 2017-07-03 MED ORDER — DIPHENHYDRAMINE HCL 50 MG/ML IJ SOLN
25.0000 mg | Freq: Once | INTRAMUSCULAR | Status: AC
Start: 2017-07-03 — End: 2017-07-03
  Administered 2017-07-03: 25 mg via INTRAVENOUS
  Filled 2017-07-03: qty 1

## 2017-07-03 MED ORDER — ONDANSETRON 4 MG PO TBDP
4.0000 mg | ORAL_TABLET | Freq: Three times a day (TID) | ORAL | Status: DC | PRN
  Filled 2017-07-03: qty 1

## 2017-07-03 MED ORDER — SODIUM CHLORIDE 0.9 % IV BOLUS (SEPSIS)
2000.0000 mL | Freq: Once | INTRAVENOUS | Status: AC
  Administered 2017-07-03: 2000 mL via INTRAVENOUS

## 2017-07-03 MED ORDER — SODIUM CHLORIDE 0.9 % IV SOLN: INTRAVENOUS | Status: DC

## 2017-07-03 MED ORDER — SODIUM CHLORIDE 0.9 % IV BOLUS (SEPSIS)
1000.0000 mL | Freq: Once | INTRAVENOUS | Status: AC
  Administered 2017-07-03: 1000 mL via INTRAVENOUS

## 2017-07-03 MED ORDER — POTASSIUM CHLORIDE 20 MEQ/15ML (10%) PO SOLN
20.0000 meq | Freq: Once | ORAL | Status: AC
  Administered 2017-07-03: 20 meq via ORAL
  Filled 2017-07-03: qty 15

## 2017-07-03 MED ORDER — ONDANSETRON 4 MG PO TBDP
4.0000 mg | ORAL_TABLET | Freq: Once | ORAL | Status: AC | PRN
  Administered 2017-07-03: 4 mg via ORAL
  Filled 2017-07-03: qty 1

## 2017-07-03 MED ORDER — DEXTROSE-NACL 5-0.45 % IV SOLN
INTRAVENOUS | Status: DC
  Administered 2017-07-04: via INTRAVENOUS

## 2017-07-03 MED ORDER — FAMOTIDINE IN NACL 20-0.9 MG/50ML-% IV SOLN
20.0000 mg | Freq: Two times a day (BID) | INTRAVENOUS | Status: DC
  Administered 2017-07-03 – 2017-07-05 (×4): 20 mg via INTRAVENOUS
  Filled 2017-07-03 (×4): qty 50

## 2017-07-03 MED ORDER — ACETAMINOPHEN 325 MG PO TABS: 650.0000 mg | ORAL_TABLET | Freq: Four times a day (QID) | ORAL | Status: DC | PRN

## 2017-07-03 MED ORDER — IBUPROFEN 200 MG PO TABS
400.0000 mg | ORAL_TABLET | Freq: Once | ORAL | Status: AC | PRN
  Administered 2017-07-03: 400 mg via ORAL
  Filled 2017-07-03: qty 2

## 2017-07-03 MED ORDER — ASPIRIN-ACETAMINOPHEN-CAFFEINE 250-250-65 MG PO TABS
1.0000 | ORAL_TABLET | Freq: Three times a day (TID) | ORAL | Status: DC | PRN
  Administered 2017-07-03 – 2017-07-05 (×3): 1 via ORAL
  Filled 2017-07-03 (×3): qty 1

## 2017-07-03 MED ORDER — MORPHINE SULFATE (PF) 2 MG/ML IV SOLN: 2.0000 mg | INTRAVENOUS | Status: DC | PRN

## 2017-07-03 NOTE — ED Triage Notes (Signed)
Patient c/o throbbing headache and emesis onset of this am. Pt states took tylenol yesterday but stopped due to no relief of symptoms.

## 2017-07-03 NOTE — H&P (Signed)
History and Physical    Edward Walls [id number] DOB: 08-18-1981 DOA: 07/03/2017  Referring MD/NP/PA:   PCP: Patient, No Pcp Per   Patient coming from:  The patient is coming from home.  At baseline, pt is independent for most of ADL.       Chief Complaint: Nausea, vomiting, headache  HPI: Edward Walls is a 36 y.o. male with medical history significant of poorly controlled diabetes, allergy, who presents with nausea, vomiting and headache.  Patient states that he started having nausea, vomiting and headache since this morning. He vomited 5 times without blood in the vomitus. No diarrhea. He also has mild abdominal pain, which is located in the central abdomen, constant, dull, nonradiating. No fever or chills. His headache is located in the frontal head, constant, severe, throbbing pain, nonradiating. No neck rigidity. No unilateral weakness, numbness or tingling in his extremities. No vision change or hearing loss. Patient denies symptoms of UTI. Per his mother, patient has not been taking insulin consistently. Patient does not have confusion.  ED Course: pt was found to have DKA with the blood sugar 382, bicarbonate 11 and anion gap 26, WBC 8.6, lactic acid 1.28, negative troponin, lipase 27, acute renal injury with creatinine 1.49, temperature 99, tachycardia, tachypnea, oxygen saturation 98% on room air. Negative chest x-ray. Negative CT head for acute intracranial abnormalities. Patient is admitted to stepdown as inpatient.   Review of Systems:   General: no fevers, chills, no body weight gain, has poor appetite, has fatigue HEENT: no blurry vision, hearing changes or sore throat. Has HA. Respiratory: no dyspnea, coughing, wheezing CV: no chest pain, no palpitations GI: has nausea, vomiting, abdominal pain, no diarrhea, constipation GU: no dysuria, burning on urination, increased urinary frequency, hematuria  Ext: no leg edema Neuro: no unilateral weakness, numbness, or  tingling, no vision change or hearing loss Skin: no rash, no skin tear. MSK: No muscle spasm, no deformity, no limitation of range of movement in spin Heme: No easy bruising.  Travel history: No recent long distant travel.  Allergy:  Allergies  Allergen Reactions  . Peanut-Containing Drug Products Other (See Comments)    Throat burning     Past Medical History:  Diagnosis Date  . Allergy   . Diabetes mellitus without complication [facility])     Past Surgical History:  Procedure Laterality Date  . DENTAL SURGERY      Social History:  reports that he has never smoked. He has never used smokeless tobacco. He reports that he does not drink alcohol or use drugs.  Family History:  Family History  Problem Relation Age of Onset  . Diabetes Mellitus II Mother   . Diabetes Mellitus II Father   . Diabetes Mellitus II Brother      Prior to Admission medications   Medication Sig Start Date End Date Taking? Authorizing Provider  ibuprofen (ADVIL,MOTRIN) 200 MG tablet Take 400 mg by mouth every 6 (six) hours as needed for headache, mild pain or moderate pain.   Yes [provider]  Insulin Detemir (LEVEMIR FLEXPEN) 100 UNIT/ML Pen Inject 25 Units into the skin daily at 10 pm. 10/22/16  Yes Donne Hazel, MD  insulin lispro (HUMALOG KWIKPEN) 100 UNIT/ML KiwkPen Inject 0.05 mLs (5 Units total) into the skin 3 (three) times daily. 10/22/16  Yes Donne Hazel, MD  lisinopril (PRINIVIL,ZESTRIL) 2.5 MG tablet Take 1 tablet (2.5 mg total) by mouth daily. 10/23/16  Yes Donne Hazel, MD  blood glucose  meter kit and supplies KIT Dispense based on patient and insurance preference. Use up to four times daily as directed. (FOR ICD-9 250.00, 250.01). 10/22/16   Donne Hazel, MD  Insulin Pen Needle 31G X 5 MM MISC 1 Device by Does not apply route QID. 10/22/16   Donne Hazel, MD  polyethylene glycol powder (MIRALAX) powder TAKE 6 CAPFULS OF MIRALAX IN A 32 OUNCE GATORADE AND DRINK THE WHOLE  BEVERAGE FOLLOWED BY 3 CAPFULS TWICE A DAY FOR THE NEXT WEEK AND FOLLOW UP WITH YOUR PRIMARY CARE PHYSICIAN. Patient not taking: Reported on 10/24/2016 05/05/16   Leo Grosser, MD    Physical Exam: Vitals:   07/03/17 1838 07/03/17 1900 07/03/17 1930 07/03/17 2000  BP: (!) 141/79 138/78 137/79 (!) 145/79  Pulse: (!) 108 (!) 102 (!) 103 (!) 102  Resp: 17 (!) 22 (!) 22 20  Temp:      TempSrc:      SpO2: 100% 100% 100% 99%  Weight:      Height:       General: Not in acute distress HEENT:       Eyes: PERRL, EOMI, no scleral icterus.       ENT: No discharge from the ears and nose, no pharynx injection, no tonsillar enlargement.        Neck: No JVD, no bruit, no mass felt. Heme: No neck lymph node enlargement. Cardiac: S1/S2, RRR, No murmurs, No gallops or rubs. Respiratory: No rales, wheezing, rhonchi or rubs. GI: Soft, nondistended, mild tenderness in central abdomen, no rebound pain, no organomegaly, BS present. GU: No hematuria Ext: No pitting leg edema bilaterally. 2+DP/PT pulse bilaterally. Musculoskeletal: No joint deformities, No joint redness or warmth, no limitation of ROM in spin. Skin: No rashes.  Neuro: Alert, oriented X3, cranial nerves II-XII grossly intact, moves all extremities normally. Muscle strength 5/5 in all extremities, sensation to light touch intact. Brachial reflex 2+ bilaterally. Negative Babinski's sign. Psych: Patient is not psychotic, no suicidal or hemocidal ideation.  Labs on Admission: I have personally reviewed following labs and imaging studies  CBC:  Recent Labs Lab 07/03/17 1259  WBC 8.6  NEUTROABS 6.3  HGB 16.0  HCT 48.9  MCV 85.3  PLT 789   Basic Metabolic Panel:  Recent Labs Lab 07/03/17 1259  NA 133*  K 5.0  CL 96*  CO2 11*  GLUCOSE 382*  BUN 15  CREATININE 1.49*  CALCIUM 9.3   GFR: Estimated Creatinine Clearance: 71.7 mL/min (A) (by C-G formula based on SCr of 1.49 mg/dL (H)). Liver Function Tests:  Recent Labs Lab  07/03/17 1259  AST 25  ALT 25  ALKPHOS 98  BILITOT 1.5*  PROT 8.7*  ALBUMIN 5.0    Recent Labs Lab 07/03/17 1735  LIPASE 27   No results for input(s): AMMONIA in the last 168 hours. Coagulation Profile: No results for input(s): INR, PROTIME in the last 168 hours. Cardiac Enzymes:  Recent Labs Lab 07/03/17 1735  TROPONINI <0.03   BNP (last 3 results) No results for input(s): PROBNP in the last 8760 hours. HbA1C: No results for input(s): HGBA1C in the last 72 hours. CBG:  Recent Labs Lab 07/03/17 1830 07/03/17 1948  GLUCAP 469* 421*   Lipid Profile: No results for input(s): CHOL, HDL, LDLCALC, TRIG, CHOLHDL, LDLDIRECT in the last 72 hours. Thyroid Function Tests: No results for input(s): TSH, T4TOTAL, FREET4, T3FREE, THYROIDAB in the last 72 hours. Anemia Panel: No results for input(s): VITAMINB12, FOLATE, FERRITIN, TIBC, IRON, RETICCTPCT  in the last 72 hours. Urine analysis:    Component Value Date/Time   COLORURINE COLORLESS (A) 10/20/2016 1436   APPEARANCEUR CLEAR 10/20/2016 1436   LABSPEC 1.025 10/20/2016 1436   PHURINE 5.0 10/20/2016 1436   GLUCOSEU >=500 (A) 10/20/2016 1436   HGBUR NEGATIVE 10/20/2016 1436   BILIRUBINUR NEGATIVE 10/20/2016 1436   KETONESUR 80 (A) 10/20/2016 1436   PROTEINUR NEGATIVE 10/20/2016 1436   NITRITE NEGATIVE 10/20/2016 1436   LEUKOCYTESUR NEGATIVE 10/20/2016 1436   Sepsis Labs: '@LABRCNTIP' (procalcitonin:4,lacticidven:4) )No results found for this or any previous visit (from the past 240 hour(s)).   Radiological Exams on Admission: Dg Chest 2 View  Result Date: 07/03/2017 CLINICAL DATA:  Cough.  Nausea and vomiting. EXAM: CHEST  2 VIEW COMPARISON:  No recent prior. FINDINGS: Mediastinum and hilar structures normal. Lungs are clear. Heart size normal. No pleural effusion or pneumothorax. Thoracolumbar spine scoliosis. IMPRESSION: No acute cardiopulmonary disease. Electronically Signed   By: Marcello Moores  Register   On: 07/03/2017  13:33   Ct Head Wo Contrast  Result Date: 07/03/2017 CLINICAL DATA:  Headache with emesis. EXAM: CT HEAD WITHOUT CONTRAST TECHNIQUE: Contiguous axial images were obtained from the base of the skull through the vertex without intravenous contrast. COMPARISON:  None. FINDINGS: Brain: No evidence of acute infarction, hemorrhage, hydrocephalus, extra-axial collection or mass lesion/mass effect. Vascular: No hyperdense vessel or unexpected calcification. Skull: Normal. Negative for fracture or focal lesion. Sinuses/Orbits: No acute finding. Other: None. IMPRESSION: No cause for the patient's headache identified. No acute abnormality. Electronically Signed   By: Dorise Bullion III M.D   On: 07/03/2017 18:19     EKG:  Not done in ED, will get one.   Assessment/Plan Active Problems:   DKA (diabetic ketoacidoses) ([place])   Diabetes mellitus type 2 with ketoacidosis, uncontrolled ([place])   AKI (acute kidney injury) ([place])   Headache   Nausea & vomiting   Abdominal pain  DKA (diabetic ketoacidoses) ([place]): DKA with blood sugar 382, bicarbonate 11 and anion gap 26. Mental status normal. Most likely due to not taking insulin consistently, triggered by nausea and vomiting.  - Admit to stepdown as inpt - will give 3L of NS bolus - start DKA protocol with BMP q4h - IVF: NS 125 cc/h; will switch to D5-1/2NS when CBG<250 - replete K as needed - Zofran prn nausea  - NPO  - consult to diabetic educator and case manager  Nausea & vomiting and AP: Likely due to DKA. Differential diagnoses is viral gastritis. No acute abdomen on physical examination. Lipase normal. -Supportive care -IV fluid as above -When necessary Zofran for nausea -IV pepcid  Diabetes mellitus type 2 with ketoacidosis, uncontrolled ([place]): Last A1c 12.0 on 10/20/16, poorly controled. Patient is taking Humalog and Levemir at home, but not taking it consistently. -on DAK protocol now -Check A1c -consult to diabetic educator  AKI (acute  kidney injury) [facility]): pt's Cre is 1.49 on admission. Likely due to prerenal secondary to dehydration and continuation of ACEI and NSAIDs. - IVF as above - Check FeNa  - Follow up renal function by BMP - Hold lisinopril and ibuprofen  Headache: Etiology is not clear. No focal neurological findings. CT head is negative. No fever or leukocytosis, likely to have for infectious etiology. -Patient was given Reglan and Benadryl in ED with mild improvement -When necessary Excedrin   DVT ppx:  SQ Lovenox Code Status: Full code Family Communication: Yes, patient's mother at bed side Disposition Plan:  Anticipate discharge back to previous  home environment Consults called:  none Admission status: SDU/inpation       Date of Service 07/03/2017    Ivor Costa [facility] Pager [phone]  If 7PM-7AM, please contact night-coverage [email or web address] Password [facility] 07/03/2017, 8:17 PM

## 2017-07-04 DIAGNOSIS — E1110 Type 2 diabetes mellitus with ketoacidosis without coma: Principal | ICD-10-CM

## 2017-07-04 DIAGNOSIS — R51 Headache: Secondary | ICD-10-CM

## 2017-07-04 DIAGNOSIS — R634 Abnormal weight loss: Secondary | ICD-10-CM | POA: Diagnosis present

## 2017-07-04 LAB — BASIC METABOLIC PANEL
Anion gap: 17 — ABNORMAL HIGH (ref 5–15)
Anion gap: 10 (ref 5–15)
Anion gap: 10 (ref 5–15)
Anion gap: 7 (ref 5–15)
BUN: 10 mg/dL (ref 6–20)
BUN: 11 mg/dL (ref 6–20)
BUN: 10 mg/dL (ref 6–20)
BUN: 10 mg/dL (ref 6–20)
CALCIUM: 8.3 mg/dL — AB (ref 8.9–10.3)
CHLORIDE: 111 mmol/L (ref 101–111)
CHLORIDE: 110 mmol/L (ref 101–111)
CHLORIDE: 109 mmol/L (ref 101–111)
CO2: 9 mmol/L — ABNORMAL LOW (ref 22–32)
CO2: 18 mmol/L — AB (ref 22–32)
CO2: 18 mmol/L — AB (ref 22–32)
CO2: 21 mmol/L — AB (ref 22–32)
CREATININE: 1.18 mg/dL (ref 0.61–1.24)
CREATININE: 1.05 mg/dL (ref 0.61–1.24)
CREATININE: 0.99 mg/dL (ref 0.61–1.24)
Calcium: 8 mg/dL — ABNORMAL LOW (ref 8.9–10.3)
Calcium: 8.3 mg/dL — ABNORMAL LOW (ref 8.9–10.3)
Calcium: 8.4 mg/dL — ABNORMAL LOW (ref 8.9–10.3)
Chloride: 106 mmol/L (ref 101–111)
Creatinine, Ser: 0.91 mg/dL (ref 0.61–1.24)
GFR calc Af Amer: >60 mL/min (ref >60)
GFR calc Af Amer: >60 mL/min (ref >60)
GFR calc Af Amer: >60 mL/min (ref >60)
GFR calc non Af Amer: >60 mL/min (ref >60)
GFR calc non Af Amer: >60 mL/min (ref >60)
GFR calc non Af Amer: >60 mL/min (ref >60)
GFR calc non Af Amer: >60 mL/min (ref >60)
GLUCOSE: 227 mg/dL — AB (ref 65–99)
Glucose, Bld: 153 mg/dL — ABNORMAL HIGH (ref 65–99)
Glucose, Bld: 153 mg/dL — ABNORMAL HIGH (ref 65–99)
Glucose, Bld: 264 mg/dL — ABNORMAL HIGH (ref 65–99)
POTASSIUM: 2.7 mmol/L — AB (ref 3.5–5.1)
Potassium: 4.5 mmol/L (ref 3.5–5.1)
Potassium: 3.6 mmol/L (ref 3.5–5.1)
Potassium: 3.5 mmol/L (ref 3.5–5.1)
SODIUM: 137 mmol/L (ref 135–145)
Sodium: 138 mmol/L (ref 135–145)
Sodium: 137 mmol/L (ref 135–145)
Sodium: 134 mmol/L — ABNORMAL LOW (ref 135–145)

## 2017-07-04 LAB — GLUCOSE, CAPILLARY
GLUCOSE-CAPILLARY: 268 mg/dL — AB (ref 65–99)
GLUCOSE-CAPILLARY: 185 mg/dL — AB (ref 65–99)
GLUCOSE-CAPILLARY: 206 mg/dL — AB (ref 65–99)
GLUCOSE-CAPILLARY: 187 mg/dL — AB (ref 65–99)
GLUCOSE-CAPILLARY: 134 mg/dL — AB (ref 65–99)
GLUCOSE-CAPILLARY: 130 mg/dL — AB (ref 65–99)
GLUCOSE-CAPILLARY: 145 mg/dL — AB (ref 65–99)
GLUCOSE-CAPILLARY: 148 mg/dL — AB (ref 65–99)
GLUCOSE-CAPILLARY: 160 mg/dL — AB (ref 65–99)
GLUCOSE-CAPILLARY: 254 mg/dL — AB (ref 65–99)
Glucose-Capillary: 234 mg/dL — ABNORMAL HIGH (ref 65–99)
Glucose-Capillary: 154 mg/dL — ABNORMAL HIGH (ref 65–99)
Glucose-Capillary: 140 mg/dL — ABNORMAL HIGH (ref 65–99)
Glucose-Capillary: 149 mg/dL — ABNORMAL HIGH (ref 65–99)
Glucose-Capillary: 181 mg/dL — ABNORMAL HIGH (ref 65–99)
Glucose-Capillary: 194 mg/dL — ABNORMAL HIGH (ref 65–99)
Glucose-Capillary: 243 mg/dL — ABNORMAL HIGH (ref 65–99)
Glucose-Capillary: 221 mg/dL — ABNORMAL HIGH (ref 65–99)
Glucose-Capillary: 175 mg/dL — ABNORMAL HIGH (ref 65–99)

## 2017-07-04 LAB — MRSA PCR SCREENING: MRSA by PCR: NEGATIVE

## 2017-07-04 LAB — HEMOGLOBIN A1C
HEMOGLOBIN A1C: 14.5 % — AB (ref 4.8–5.6)
MEAN PLASMA GLUCOSE: 369.45 mg/dL

## 2017-07-04 LAB — HIV ANTIBODY (ROUTINE TESTING W REFLEX): HIV Screen 4th Generation wRfx: NONREACTIVE

## 2017-07-04 MED ORDER — POTASSIUM CHLORIDE 10 MEQ/100ML IV SOLN
10.0000 meq | INTRAVENOUS | Status: DC
  Administered 2017-07-04: 10 meq via INTRAVENOUS
  Filled 2017-07-04: qty 100

## 2017-07-04 MED ORDER — POTASSIUM CHLORIDE 20 MEQ/15ML (10%) PO SOLN
40.0000 meq | Freq: Once | ORAL | Status: AC
  Administered 2017-07-04: 40 meq via ORAL
  Filled 2017-07-04: qty 30

## 2017-07-04 MED ORDER — POTASSIUM CHLORIDE 20 MEQ/15ML (10%) PO SOLN
40.0000 meq | Freq: Once | ORAL | Status: AC
  Administered 2017-07-05: 40 meq via ORAL
  Filled 2017-07-04: qty 30

## 2017-07-04 MED ORDER — ORAL CARE MOUTH RINSE: 15.0000 mL | Freq: Two times a day (BID) | OROMUCOSAL | Status: DC

## 2017-07-04 MED ORDER — CHLORHEXIDINE GLUCONATE 0.12 % MT SOLN
15.0000 mL | Freq: Two times a day (BID) | OROMUCOSAL | Status: DC
  Administered 2017-07-04 (×2): 15 mL via OROMUCOSAL
  Filled 2017-07-04 (×4): qty 15

## 2017-07-04 MED ORDER — KETOROLAC TROMETHAMINE 30 MG/ML IJ SOLN: 30.0000 mg | Freq: Four times a day (QID) | INTRAMUSCULAR | Status: DC | PRN

## 2017-07-04 NOTE — Care Management Note (Signed)
Case Management Note  Patient Details  Name: Edward Walls MRN: [id number] Date of Birth: [birth year]  Subjective/Objective:   dka                 Action/Plan: Date:  July 04, 2017 Chart reviewed for concurrent status and case management needs. Will continue to follow patient progress. Discharge Planning: following for needs Expected discharge date: [id number] Marcelle Smiling, BSN, [place], [place]   [phone]  Expected Discharge Date:   (unknown)               Expected Discharge Plan:  Home/Self Care  In-House Referral:     Discharge planning Services  CM Consult  Post Acute Care Choice:    Choice offered to:     DME Arranged:    DME Agency:     HH Arranged:    HH Agency:     Status of Service:  In process, will continue to follow  If discussed at Long Length of Stay Meetings, dates discussed:    Additional Comments:  Golda Acre, RN 07/04/2017, 8:55 AM

## 2017-07-04 NOTE — Progress Notes (Addendum)
Inpatient Diabetes Program Recommendations  AACE/ADA: New Consensus Statement on Inpatient Glycemic Control (2015)  Target Ranges:  Prepandial:   less than 140 mg/dL      Peak postprandial:   less than 180 mg/dL (1-2 hours)      Critically ill patients:  140 - 180 mg/dL   Lab Results  Component Value Date   GLUCAP 130 (H) 07/04/2017   HGBA1C 14.5 (H) 07/04/2017    Review of Glycemic Control  Diabetes history: DM2 Outpatient Diabetes medications: Levemir 25 units QHS, Novolog 5 units tidwc (has not been taking) Current orders for Inpatient glycemic control: IV insulin per GlucoStabilizer  CO2 is 18. AG is closed.  Pt has lost approx 50 pounds since January 2018. Has not been taking his insulin lately. Checks blood sugars and it usually runs between 100-250 mg/dL. When asked if he had lost weight, he replied, "maybe a little." To discuss HgbA1C of 14.5% when pt is feeling better.  Inpatient Diabetes Program Recommendations:    When criteria met to d/c insulin drip, give Levemir 20 units Q24H. Begin Novolog 0-9 units tidwc and hs Add Novolog 4 units tidwc for meal coverage insulin.  Will continue to educate on importance of glucose control at home. Will need to obtain a PCP for management of his diabetes. Will ask RN to allow pt to give his own insulin while inpatient.  Thank you. Lorenda Peck, RD, LDN, CDE Inpatient Diabetes Coordinator [phone]

## 2017-07-04 NOTE — Progress Notes (Signed)
Progress Note    ANUAR WALGREN  NWG:[id number] DOB: 01/11/1981  DOA: 07/03/2017 PCP: Patient, No Pcp Per    Brief Narrative:   Chief complaint: Nausea, vomiting and headache.  Medical records reviewed and are as summarized below:  RIDWAN BONDY is an 36 y.o. male with a PMH of newly diagnosed diabetes in January 2018 after he presented to the hospital in DKA. He was discharged on Levemir and lispro, but apparently took insulin rarely/inconsistently. He subsequently was admitted 07/03/17 with recurrent DKA.  Assessment/Plan:   Principal Problem:   DKA (diabetic ketoacidoses) (HCC)/Diabetes mellitus type 2 with ketoacidosis, uncontrolled Triggered by noncompliance with insulin therapy. Has been seen and counseled by the diabetes coordinator and by myself. 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 We'll check glutamic acid decarboxylase autoantibodies. Likely will be able to transition to basal/bolus insulin soon. Hemoglobin A1c 14.5%, corresponding to a mean plasma glucose of 369. Anion gap currently 10, bicarbonate 18. Once the patient is off IV insulin, will transfer to the floor.  Active Problems:   AKI (acute kidney injury) (HCC) Baseline creatinine is 0.57, creatinine on admission was 1.54 and has improved to 1.18 with rehydration.    Headache Treat supportively with pain medications for now. CT of the head negative.    Nausea & vomiting/Abdominal pain Improved with resolution of ketoacidosis.    Abnormal weight loss Body mass index is 20 kg/m. Endorses a 50 pound weight loss, likely from uncontrolled diabetes and loss of glucose in the urine.   Family Communication/Anticipated D/C date and plan/Code Status   DVT prophylaxis: Lovenox ordered. Code Status: Full Code.  Family Communication: No family currently at the bedside. Disposition Plan: Likely will be stable to be discharged in the next 24 hours.   Medical Consultants:    None.   Anti-Infectives:      None  Subjective:   Continues to report a headache. No current active nausea/vomiting.  Objective:    Vitals:   07/04/17 0500 07/04/17 0600 07/04/17 0700 07/04/17 0800  BP: 137/74 128/62 137/78 (!) 143/79  Pulse: 87 87 89 84  Resp: Temp:    98.2 F (36.8 C)  TempSrc:    Oral  SpO2: 100% 98% 100% 98%  Weight:      Height:        Intake/Output Summary (Last 24 hours) at 07/04/17 1207 Last data filed at 07/04/17 0810  Gross per 24 hour  Intake          3245.84 ml  Output             1260 ml  Net          1985.84 ml   Filed Weights   07/03/17 1835  Weight: 72.6 kg (160 lb)    Exam: General: Mildly lethargic. Mildly toxic appearing. No acute distress. Cardiovascular: Heart sounds show a regular rate, and rhythm. No gallops or rubs. No murmurs. No JVD. Lungs: Clear to auscultation bilaterally with good air movement. No rales, rhonchi or wheezes. Abdomen: Soft, nontender, nondistended with normal active bowel sounds. No masses. No hepatosplenomegaly. Neurological: Alert and oriented 3. Moves all extremities 4 with equal strength. Cranial nerves II through XII grossly intact. Skin: Warm and dry. No rashes or lesions. Extremities: No clubbing or cyanosis. No edema. Pedal pulses 2+. Psychiatric: Mood and affect are flat. Insight and judgment are fair.   Data Reviewed:   I have personally reviewed following labs and  imaging studies:  Labs: Labs show the following: Na 138, K 3.6, Cl 110, CO2 18, glucose 153, BUN 11, creatinine 1.05, calcium 8.3, AST 25, ALT 25, alkaline phosphatase 98, bilirubin 1.5, protein 8.7, albumin 5.0, lipase 27. Hemoglobin 16, hematocrit 48.9, platelets 251. Lactic acid 1.28.  CBGs 130-154. Hemoglobin A1c 14.5%.  Microbiology MRSA PCR swab negative.  Procedures and diagnostic studies:  Chest x-ray 07/03/17: My independent review of the image shows: No evidence of pneumonia. CT of the head 07/03/17: My independent review of  the image shows: No acute findings to suggest stroke or other etiology of headache.   Medications:   . chlorhexidine  15 mL Mouth Rinse BID  . enoxaparin (LOVENOX) injection  40 mg Subcutaneous Q24H  . mouth rinse  15 mL Mouth Rinse q12n4p   Continuous Infusions: . sodium chloride    . sodium chloride 125 mL/hr at 07/04/17 0700  . sodium chloride Stopped (07/04/17 0012)  . dextrose 5 % and 0.45% NaCl 125 mL/hr at 07/04/17 0744  . dextrose 5 % and 0.45% NaCl 125 mL/hr at 07/04/17 0700  . famotidine (PEPCID) IV 20 mg (07/04/17 1134)  . insulin (NOVOLIN-R) infusion 7.6 mL/hr at 07/04/17 0700     LOS: 1 day   Alyna Stensland  [facility] Pager [phone]. If unable to reach me by pager, please call my cell phone at [phone].  *Please refer to [email or web address], password [email or web address] to get updated schedule on who will round on this patient, as hospitalists switch teams weekly. If 7PM-7AM, please contact night-coverage at [email or web address], password [email or web address] for any overnight needs.  07/04/2017, 12:07 PM

## 2017-07-04 NOTE — Progress Notes (Signed)
CRITICAL VALUE ALERT  Critical Value:  K 2.7  Date & Time Notied:  09/19 2114  Provider Notified: Dr. Kirtland Bouchard Schorr  Orders Received/Actions taken: no orders received at this moment

## 2017-07-05 LAB — BASIC METABOLIC PANEL
ANION GAP: 8 (ref 5–15)
ANION GAP: 9 (ref 5–15)
BUN: 10 mg/dL (ref 6–20)
BUN: 8 mg/dL (ref 6–20)
CHLORIDE: 107 mmol/L (ref 101–111)
CHLORIDE: 109 mmol/L (ref 101–111)
CO2: 20 mmol/L — AB (ref 22–32)
CO2: 21 mmol/L — AB (ref 22–32)
CREATININE: 0.88 mg/dL (ref 0.61–1.24)
Calcium: 8.4 mg/dL — ABNORMAL LOW (ref 8.9–10.3)
Calcium: 8.7 mg/dL — ABNORMAL LOW (ref 8.9–10.3)
Creatinine, Ser: 0.83 mg/dL (ref 0.61–1.24)
GFR calc non Af Amer: >60 mL/min (ref >60)
GFR calc non Af Amer: >60 mL/min (ref >60)
GLUCOSE: 138 mg/dL — AB (ref 65–99)
GLUCOSE: 157 mg/dL — AB (ref 65–99)
Potassium: 3.3 mmol/L — ABNORMAL LOW (ref 3.5–5.1)
Potassium: 3.7 mmol/L (ref 3.5–5.1)
Sodium: 137 mmol/L (ref 135–145)
Sodium: 137 mmol/L (ref 135–145)

## 2017-07-05 LAB — GLUTAMIC ACID DECARBOXYLASE AUTO ABS

## 2017-07-05 LAB — GLUCOSE, CAPILLARY
GLUCOSE-CAPILLARY: 99 mg/dL (ref 65–99)
GLUCOSE-CAPILLARY: 122 mg/dL — AB (ref 65–99)
GLUCOSE-CAPILLARY: 133 mg/dL — AB (ref 65–99)
GLUCOSE-CAPILLARY: 320 mg/dL — AB (ref 65–99)
Glucose-Capillary: 107 mg/dL — ABNORMAL HIGH (ref 65–99)
Glucose-Capillary: 128 mg/dL — ABNORMAL HIGH (ref 65–99)
Glucose-Capillary: 216 mg/dL — ABNORMAL HIGH (ref 65–99)
Glucose-Capillary: 312 mg/dL — ABNORMAL HIGH (ref 65–99)
Glucose-Capillary: 402 mg/dL — ABNORMAL HIGH (ref 65–99)

## 2017-07-05 MED ORDER — INSULIN ASPART 100 UNIT/ML ~~LOC~~ SOLN
3.0000 [IU] | Freq: Three times a day (TID) | SUBCUTANEOUS | Status: DC
  Administered 2017-07-05: 3 [IU] via SUBCUTANEOUS

## 2017-07-05 MED ORDER — INSULIN ASPART 100 UNIT/ML ~~LOC~~ SOLN
0.0000 [IU] | Freq: Three times a day (TID) | SUBCUTANEOUS | Status: DC
  Administered 2017-07-05: 9 [IU] via SUBCUTANEOUS
  Administered 2017-07-05: 7 [IU] via SUBCUTANEOUS
  Administered 2017-07-06: 5 [IU] via SUBCUTANEOUS

## 2017-07-05 MED ORDER — INSULIN DETEMIR 100 UNIT/ML ~~LOC~~ SOLN
20.0000 [IU] | Freq: Every day | SUBCUTANEOUS | Status: DC
  Administered 2017-07-06: 10:00:00 20 [IU] via SUBCUTANEOUS
  Filled 2017-07-05: qty 0.2

## 2017-07-05 MED ORDER — INSULIN ASPART 100 UNIT/ML ~~LOC~~ SOLN
0.0000 [IU] | Freq: Every day | SUBCUTANEOUS | Status: DC
  Administered 2017-07-05: 22:00:00 4 [IU] via SUBCUTANEOUS

## 2017-07-05 MED ORDER — INSULIN DETEMIR 100 UNIT/ML ~~LOC~~ SOLN
15.0000 [IU] | Freq: Every day | SUBCUTANEOUS | Status: DC
  Administered 2017-07-05: 15 [IU] via SUBCUTANEOUS
  Filled 2017-07-05: qty 0.15

## 2017-07-05 MED ORDER — FAMOTIDINE 20 MG PO TABS
20.0000 mg | ORAL_TABLET | Freq: Two times a day (BID) | ORAL | Status: DC
  Administered 2017-07-05 – 2017-07-06 (×2): 20 mg via ORAL
  Filled 2017-07-05 (×2): qty 1

## 2017-07-05 MED ORDER — INSULIN ASPART 100 UNIT/ML ~~LOC~~ SOLN
6.0000 [IU] | Freq: Three times a day (TID) | SUBCUTANEOUS | Status: DC
  Administered 2017-07-05 – 2017-07-06 (×2): 6 [IU] via SUBCUTANEOUS

## 2017-07-05 NOTE — Progress Notes (Signed)
Progress Note    Edward Walls  ZOX:[id number] DOB: 1981-06-17  DOA: 07/03/2017 PCP: Patient, No Pcp Per    Brief Narrative:   Chief complaint: Nausea, vomiting and headache.  Medical records reviewed and are as summarized below:  Edward Walls is an 36 y.o. male with a PMH of newly diagnosed diabetes in January 2018 after he presented to the hospital in DKA. He was discharged on Levemir and lispro, but apparently took insulin rarely/inconsistently. He subsequently was admitted 07/03/17 with recurrent DKA.  Assessment/Plan:   Principal Problem:   DKA (diabetic ketoacidoses) (HCC)/Diabetes mellitus type 2 with ketoacidosis, uncontrolled Triggered by noncompliance with insulin therapy. Has been seen and counseled by the diabetes coordinator and by myself. 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 Glutamic acid decarboxylase autoantibodies were sent off 07/04/17. Transition to basal/bolus insulin this morning. Hemoglobin A1c 14.5%, corresponding to a mean plasma glucose of 369. Anion gap currently 8, bicarbonate 20. Once the patient is off IV insulin, will transfer to the floor.  Active Problems:   Hypokalemia Repleted.    AKI (acute kidney injury) (HCC) Baseline creatinine is 0.57, creatinine on admission was 1.54 and has improved to 0.87 with rehydration.    Headache Treat supportively with pain medications for now. CT of the head negative.    Nausea & vomiting/Abdominal pain Improved with resolution of ketoacidosis.    Abnormal weight loss Body mass index is 20 kg/m. Endorses a 50 pound weight loss, likely from uncontrolled diabetes and loss of glucose in the urine.   Family Communication/Anticipated D/C date and plan/Code Status   DVT prophylaxis: Lovenox ordered. Code Status: Full Code.  Family Communication: No family currently at the bedside. Mother updated by phone. Disposition Plan: Tx to floor, likely d/c in next 24 hours.   Medical Consultants:     None.   Anti-Infectives:    None  Subjective:   Feels much better.  Ate dinner last night. No nausea or vomiting.  Headache resolved.  Objective:    Vitals:   07/05/17 0322 07/05/17 0400 07/05/17 0500 07/05/17 0700  BP:  (!) 149/90 124/76   Pulse:  84 80   Resp:  16 13   Temp: (!) 97.5 F (36.4 C)   98.4 F (36.9 C)  TempSrc: Axillary   Oral  SpO2:  93% 99%   Weight:      Height:        Intake/Output Summary (Last 24 hours) at 07/05/17 0720 Last data filed at 07/05/17 0500  Gross per 24 hour  Intake             8243 ml  Output              700 ml  Net             7543 ml   Filed Weights   07/03/17 1835  Weight: 72.6 kg (160 lb)    Exam: General: Awake and alert today. No acute distress. Cardiovascular: Heart sounds show a regular rate, and rhythm. No gallops or rubs. No murmurs. No JVD. Lungs: Clear to auscultation bilaterally with good air movement. No rales, rhonchi or wheezes. Abdomen: Soft, nontender, nondistended with normal active bowel sounds. No masses. No hepatosplenomegaly. Extremities: No clubbing or cyanosis. No edema. Pedal pulses 2+.  Data Reviewed:   I have personally reviewed following labs and imaging studies:  Labs: Labs show the following: Sodium 137, potassium 3.7, chloride 109, bicarbonate 20, BUN 8, creatinine 0.83,  glucose 138, calcium 8.4.  CBGs 99-133. Hemoglobin A1c 14.5%.  Microbiology MRSA PCR swab negative.  Procedures and diagnostic studies: No new studies today.  Chest x-ray 07/03/17: My independent review of the image shows: No evidence of pneumonia. CT of the head 07/03/17: My independent review of the image shows: No acute findings to suggest stroke or other etiology of headache.   Medications:   . chlorhexidine  15 mL Mouth Rinse BID  . enoxaparin (LOVENOX) injection  40 mg Subcutaneous Q24H  . insulin detemir  15 Units Subcutaneous Daily  . mouth rinse  15 mL Mouth Rinse q12n4p   Continuous Infusions: .  sodium chloride    . sodium chloride 125 mL/hr at 07/05/17 0500  . sodium chloride Stopped (07/04/17 0012)  . dextrose 5 % and 0.45% NaCl Stopped (07/05/17 0524)  . dextrose 5 % and 0.45% NaCl 125 mL/hr at 07/04/17 0700  . famotidine (PEPCID) IV Stopped (07/04/17 2305)  . insulin (NOVOLIN-R) infusion 2 mL/hr at 07/05/17 0500     LOS: 2 days   Dula Havlik  [facility] Pager [phone]. If unable to reach me by pager, please call my cell phone at [phone].  *Please refer to [email or web address], password [email or web address] to get updated schedule on who will round on this patient, as hospitalists switch teams weekly. If 7PM-7AM, please contact night-coverage at [email or web address], password [email or web address] for any overnight needs.  07/05/2017, 7:20 AM

## 2017-07-05 NOTE — Progress Notes (Signed)
PHARMACIST - PHYSICIAN COMMUNICATION CONCERNING: IV to Oral Route Change Policy  RECOMMENDATION: This patient is receiving Pepcid by the intravenous route.  Based on criteria approved by the Pharmacy and Therapeutics Committee, the intravenous medication(s) is/are being converted to the equivalent oral dose form(s).   DESCRIPTION: These criteria include:  The patient is eating (either orally or via tube) and/or has been taking other orally administered medications for a least 24 hours  The patient has no evidence of active gastrointestinal bleeding or impaired GI absorption (gastrectomy, short bowel, patient on TNA or NPO).  If you have questions about this conversion, please contact the Pharmacy Department    [phone] )  [facility]   [phone] )  [facility]   [phone] )  [facility]   [phone] )  [facility]   [phone] )  [facility]   Clance Boll, [facility] 07/05/2017 12:52 PM

## 2017-07-05 NOTE — Progress Notes (Signed)
Inpatient Diabetes Program Recommendations  AACE/ADA: New Consensus Statement on Inpatient Glycemic Control (2015)  Target Ranges:  Prepandial:   less than 140 mg/dL      Peak postprandial:   less than 180 mg/dL (1-2 hours)      Critically ill patients:  140 - 180 mg/dL   Lab Results  Component Value Date   GLUCAP 402 (H) 07/05/2017   HGBA1C 14.5 (H) 07/04/2017    Review of Glycemic Control  Blood sugars > 180 mg/dL. Transitioned to basal/bolus insulin.  Inpatient Diabetes Program Recommendations:   Increase Levemir to 20 units Q24H. Increase Novolog to 6 units tidwc for meal coverage insulin.  Will discuss diabetes control this afternoon with pt.    Thank you. Ailene Ards, RD, LDN, CDE Inpatient Diabetes Coordinator [phone]

## 2017-07-06 DIAGNOSIS — R1084 Generalized abdominal pain: Secondary | ICD-10-CM

## 2017-07-06 DIAGNOSIS — E876 Hypokalemia: Secondary | ICD-10-CM

## 2017-07-06 DIAGNOSIS — N179 Acute kidney failure, unspecified: Secondary | ICD-10-CM

## 2017-07-06 DIAGNOSIS — E1310 Other specified diabetes mellitus with ketoacidosis without coma: Secondary | ICD-10-CM

## 2017-07-06 DIAGNOSIS — R112 Nausea with vomiting, unspecified: Secondary | ICD-10-CM

## 2017-07-06 LAB — GLUCOSE, CAPILLARY: Glucose-Capillary: 293 mg/dL — ABNORMAL HIGH (ref 65–99)

## 2017-07-06 MED ORDER — INSULIN LISPRO 100 UNIT/ML (KWIKPEN): 5.0000 [IU] | PEN_INJECTOR | Freq: Three times a day (TID) | SUBCUTANEOUS | 0 refills | Status: DC

## 2017-07-06 MED ORDER — BLOOD GLUCOSE MONITOR KIT: PACK | 0 refills | Status: DC

## 2017-07-06 MED ORDER — FREESTYLE LIBRE SENSOR SYSTEM MISC: 2 refills | Status: DC

## 2017-07-06 MED ORDER — INSULIN PEN NEEDLE 31G X 5 MM MISC
1.0000 | Freq: Four times a day (QID) | 2 refills | Status: DC
Start: 2017-07-06 — End: 2020-03-23

## 2017-07-06 MED ORDER — INSULIN DETEMIR 100 UNIT/ML FLEXPEN: 20.0000 [IU] | PEN_INJECTOR | Freq: Every day | SUBCUTANEOUS | 2 refills | Status: DC

## 2017-07-06 MED ORDER — FREESTYLE LIBRE READER DEVI: 1.0000 | Freq: Four times a day (QID) | 0 refills | Status: DC

## 2017-07-06 NOTE — Discharge Summary (Signed)
Physician Discharge Summary  Edward Walls YFV:[id number] DOB: 24-Dec-1980 DOA: 07/03/2017  PCP: Patient, No Pcp Per  Admit date: 07/03/2017 Discharge date: 07/06/2017  Admitted From: Home Disposition: Home  Recommendations for Outpatient Follow-up:  1. Follow up with Edward Walls as scheduled in 2 weeks  Home Health: none Equipment/Devices: none  Discharge Condition: stable CODE STATUS: Full code Diet recommendation: carb modified  HPI: Per Edward Walls, Edward Walls is a 36 y.o. male with medical history significant of poorly controlled diabetes, allergy, who presents with nausea, vomiting and headache. Patient states that he started having nausea, vomiting and headache since this morning. He vomited 5 times without blood in the vomitus. No diarrhea. He also has mild abdominal pain, which is located in the central abdomen, constant, dull, nonradiating. No fever or chills. His headache is located in the frontal head, constant, severe, throbbing pain, nonradiating. No neck rigidity. No unilateral weakness, numbness or tingling in his extremities. No vision change or hearing loss. Patient denies symptoms of UTI. Per his mother, patient has not been taking insulin consistently. Patient does not have confusion. ED Course: pt was found to have DKA with the blood sugar 382, bicarbonate 11 and anion gap 26, WBC 8.6, lactic acid 1.28, negative troponin, lipase 27, acute renal injury with creatinine 1.49, temperature 99, tachycardia, tachypnea, oxygen saturation 98% on room air. Negative chest x-ray. Negative CT head for acute intracranial abnormalities. Patient is admitted to stepdown as inpatient.   Hospital Course: Discharge Diagnoses:  Principal Problem:   DKA (diabetic ketoacidoses) ([place]) Active Problems:   Diabetes mellitus type 2 with ketoacidosis, uncontrolled ([place])   AKI (acute kidney injury) ([place])   Headache   Nausea & vomiting   Abdominal pain   Abnormal weight loss    Hypokalemia   DKA (diabetic ketoacidoses) ([facility])/Diabetes mellitus type 2 with ketoacidosis, uncontrolled- Triggered by noncompliance with insulin therapy. Has been seen and counseled by the diabetes coordinator and by myself. Glutamic acid decarboxylase autoantibodies were negative.  Patient has had an episode like this in January 2018 and was discharged on insulin at that time.  He did well for some time, took his insulin, however made some lifestyle changes such as very low-carb diet as well as regular visits to the gym, and was able to wean off his insulin completely and still maintaining CBGs in the low 100s.  As time progressed, over the last several months, he has been exercising less and also has not been paying attention as much to his diet, and in addition he did not go back to take his insulin.  He noticed his CBGs have progressively been getting worse and most recently in the 300 400 range.  He ended up being admitted with DKA, he was placed on IV insulin, his acidosis improved and he is back to normal, no further nausea or vomiting and he is able to tolerate a regular diet.  Patient will go back on insulin, and reports that he will follow-up with Edward Walls and set up a first visit appointment in 2 weeks.  He expressed concern with Accu-Cheks and fingersticks 4 times daily as he has sometimes difficulties, and expressed interest in a continuous CBG monitoring and he was prescribed that on discharge. Hypokalemia -Repleted. AKI (acute kidney injury) ([place]) -Baseline creatinine is 0.57, creatinine on admission was 1.54 and has improved to normal with rehydration. Headache -resolved Nausea & vomiting/Abdominal pain - Improved with resolution of ketoacidosis. Abnormal weight loss -Body mass index is 20  kg/m. Endorses a 50 pound weight loss, likely from uncontrolled diabetes and loss of glucose in the urine.   Discharge Instructions   Allergies as of 07/06/2017      Reactions   Peanut-containing  Drug Products Other (See Comments)   Throat burning       Medication List    TAKE these medications   blood glucose meter kit and supplies Kit Dispense based on patient and insurance preference. Use up to four times daily as directed. (FOR ICD-9 250.00, 250.01).   FREESTYLE LIBRE READER Devi 1 Device by Does not apply route 4 (four) times daily.   FREESTYLE LIBRE SENSOR SYSTEM Misc 1 sensor every 10 days   ibuprofen 200 MG tablet Commonly known as:  ADVIL,MOTRIN Take 400 mg by mouth every 6 (six) hours as needed for headache, mild pain or moderate pain.   Insulin Detemir 100 UNIT/ML Pen Commonly known as:  LEVEMIR FLEXPEN Inject 20 Units into the skin daily at 10 pm. What changed:  how much to take   insulin lispro 100 UNIT/ML KiwkPen Commonly known as:  HUMALOG KWIKPEN Inject 0.05 mLs (5 Units total) into the skin 3 (three) times daily.   Insulin Pen Needle 31G X 5 MM Misc 1 Device by Does not apply route QID.   lisinopril 2.5 MG tablet Commonly known as:  PRINIVIL,ZESTRIL Take 1 tablet (2.5 mg total) by mouth daily.   polyethylene glycol powder powder Commonly known as:  MIRALAX TAKE 6 CAPFULS OF MIRALAX IN A 32 OUNCE GATORADE AND DRINK THE WHOLE BEVERAGE FOLLOWED BY 3 CAPFULS TWICE A DAY FOR THE NEXT WEEK AND FOLLOW UP WITH YOUR PRIMARY CARE PHYSICIAN.            Discharge Care Instructions        Start     Ordered   07/06/17 0000  blood glucose meter kit and supplies KIT    Question Answer Comment  Number of strips 100   Number of lancets 100      07/06/17 0845   07/06/17 0000  Insulin Detemir (LEVEMIR FLEXPEN) 100 UNIT/ML Pen  Daily at 10 pm     07/06/17 0845   07/06/17 0000  insulin lispro (HUMALOG KWIKPEN) 100 UNIT/ML KiwkPen  3 times daily     07/06/17 0845   07/06/17 0000  Insulin Pen Needle 31G X 5 MM MISC  4 times daily    Comments:  For use with insulin pens   07/06/17 0845   07/06/17 0000  Continuous Blood Gluc Receiver (FREESTYLE LIBRE  READER) DEVI  4 times daily     07/06/17 0845   07/06/17 0000  Continuous Blood Gluc Sensor ([place]) MISC     07/06/17 0845     Follow-up Information    Edward Walls. Schedule an appointment as soon as possible for a visit in 2 week(s).          Allergies  Allergen Reactions  . Peanut-Containing Drug Products Other (See Comments)    Throat burning     Consultations:  None   Procedures/Studies:  Dg Chest 2 View  Result Date: 07/03/2017 CLINICAL DATA:  Cough.  Nausea and vomiting. EXAM: CHEST  2 VIEW COMPARISON:  No recent prior. FINDINGS: Mediastinum and hilar structures normal. Lungs are clear. Heart size normal. No pleural effusion or pneumothorax. Thoracolumbar spine scoliosis. IMPRESSION: No acute cardiopulmonary disease. Electronically Signed   By: Reform   On: 07/03/2017 13:33   Ct Head Wo Contrast  Result Date: 07/03/2017 CLINICAL DATA:  Headache with emesis. EXAM: CT HEAD WITHOUT CONTRAST TECHNIQUE: Contiguous axial images were obtained from the base of the skull through the vertex without intravenous contrast. COMPARISON:  None. FINDINGS: Brain: No evidence of acute infarction, hemorrhage, hydrocephalus, extra-axial collection or mass lesion/mass effect. Vascular: No hyperdense vessel or unexpected calcification. Skull: Normal. Negative for fracture or focal lesion. Sinuses/Orbits: No acute finding. Other: None. IMPRESSION: No cause for the patient's headache identified. No acute abnormality. Electronically Signed   By: Dorise Bullion III M.D   On: 07/03/2017 18:19     Subjective: - no chest pain, shortness of breath, no abdominal pain, nausea or vomiting.   Discharge Exam: Vitals:   07/06/17 0542 07/06/17 1015  BP: 134/72 113/76  Pulse: 75 84  Resp: 14 17  Temp: 97.8 F (36.6 C) 97.8 F (36.6 C)  SpO2: 100% 100%    General: Pt is alert, awake, not in acute distress Cardiovascular: RRR, S1/S2 +, no rubs, no gallops Respiratory:  CTA bilaterally, no wheezing, no rhonchi Abdominal: Soft, NT, ND, bowel sounds + Extremities: no edema, no cyanosis   The results of significant diagnostics from this hospitalization (including imaging, microbiology, ancillary and laboratory) are listed below for reference.     Microbiology: Recent Results (from the past 240 hour(s))  MRSA PCR Screening     Status: None   Collection Time: 07/03/17 11:59 PM  Result Value Ref Range Status   MRSA by PCR NEGATIVE NEGATIVE Final    Comment:        The GeneXpert MRSA Assay (FDA approved for NASAL specimens only), is one component of a comprehensive MRSA colonization surveillance program. It is not intended to diagnose MRSA infection nor to guide or monitor treatment for MRSA infections.      Labs: BNP (last 3 results) No results for input(s): BNP in the last 8760 hours. Basic Metabolic Panel:  Recent Labs Lab 07/04/17 0802 07/04/17 1203 07/04/17 2018 07/04/17 2336 07/05/17 0337  NA 138 137 134* 137 137  K 3.6 3.5 2.7* 3.3* 3.7  CL 110 109 106 107 109  CO2 18* 18* 21* 21* 20*  GLUCOSE 153* 153* 264* 157* 138*  BUN '11 10 10 10 8  ' CREATININE 1.05 0.99 0.91 0.88 0.83  CALCIUM 8.3* 8.4* 8.3* 8.7* 8.4*   Liver Function Tests:  Recent Labs Lab 07/03/17 1259  AST 25  ALT 25  ALKPHOS 98  BILITOT 1.5*  PROT 8.7*  ALBUMIN 5.0    Recent Labs Lab 07/03/17 1735  LIPASE 27   No results for input(s): AMMONIA in the last 168 hours. CBC:  Recent Labs Lab 07/03/17 1259  WBC 8.6  NEUTROABS 6.3  HGB 16.0  HCT 48.9  MCV 85.3  PLT 251   Cardiac Enzymes:  Recent Labs Lab 07/03/17 1735  TROPONINI <0.03   BNP: Invalid input(s): POCBNP CBG:  Recent Labs Lab 07/05/17 0856 07/05/17 1156 07/05/17 1721 07/05/17 2159 07/06/17 0720  GLUCAP 216* 402* 312* 320* 293*   D-Dimer No results for input(s): DDIMER in the last 72 hours. Hgb A1c  Recent Labs  07/04/17 0122  HGBA1C 14.5*   Lipid Profile No  results for input(s): CHOL, HDL, LDLCALC, TRIG, CHOLHDL, LDLDIRECT in the last 72 hours. Thyroid function studies No results for input(s): TSH, T4TOTAL, T3FREE, THYROIDAB in the last 72 hours.  Invalid input(s): FREET3 Anemia work up No results for input(s): VITAMINB12, FOLATE, FERRITIN, TIBC, IRON, RETICCTPCT in the last 72 hours. Urinalysis  Component Value Date/Time   COLORURINE STRAW (A) 07/03/2017 2100   APPEARANCEUR CLEAR 07/03/2017 2100   LABSPEC 1.021 07/03/2017 2100   PHURINE 5.0 07/03/2017 2100   GLUCOSEU >=500 (A) 07/03/2017 2100   HGBUR SMALL (A) 07/03/2017 2100   BILIRUBINUR NEGATIVE 07/03/2017 2100   KETONESUR 80 (A) 07/03/2017 2100   PROTEINUR 100 (A) 07/03/2017 2100   NITRITE NEGATIVE 07/03/2017 2100   LEUKOCYTESUR NEGATIVE 07/03/2017 2100   Sepsis Labs Invalid input(s): PROCALCITONIN,  WBC,  LACTICIDVEN   Time coordinating discharge: 35 minutes  SIGNED:  Marzetta Board, MD  [facility] 07/06/2017, 3:34 PM Pager [phone]  If 7PM-7AM, please contact night-coverage [email or web address] Password [email or web address]

## 2017-07-06 NOTE — Discharge Instructions (Signed)
Accuchecks 4 times/day, Once in AM empty stomach and then before each meal. Log in all results and show them to your primary doctor in 3-5 days. If any glucose reading is under 80 or above 300 call your primary doctor immediately.  Follow with Dr. Chilton Si in 1-2 weeks  Please get a complete blood count and chemistry panel checked by your Primary MD at your next visit, and again as instructed by your Primary MD. Please get your medications reviewed and adjusted by your Primary MD.  Please request your Primary MD to go over all Hospital Tests and Procedure/Radiological results at the follow up, please get all Hospital records sent to your Prim MD by signing hospital release before you go home.  If you had Pneumonia of Lung problems at the Hospital: Please get a 2 view Chest X ray done in 6-8 weeks after hospital discharge or sooner if instructed by your Primary MD.  If you have Congestive Heart Failure: Please call your Cardiologist or Primary MD anytime you have any of the following symptoms:  1) 3 pound weight gain in 24 hours or 5 pounds in 1 week  2) shortness of breath, with or without a dry hacking cough  3) swelling in the hands, feet or stomach  4) if you have to sleep on extra pillows at night in order to breathe  Follow cardiac low salt diet and 1.5 lit/day fluid restriction.  If you have diabetes Accuchecks 4 times/day, Once in AM empty stomach and then before each meal. Log in all results and show them to your primary doctor at your next visit. If any glucose reading is under 80 or above 300 call your primary MD immediately.  If you have Seizure/Convulsions/Epilepsy: Please do not drive, operate heavy machinery, participate in activities at heights or participate in high speed sports until you have seen by Primary MD or a Neurologist and advised to do so again.  If you had Gastrointestinal Bleeding: Please ask your Primary MD to check a complete blood count within one week of  discharge or at your next visit. Your endoscopic/colonoscopic biopsies that are pending at the time of discharge, will also need to followed by your Primary MD.  Get Medicines reviewed and adjusted. Please take all your medications with you for your next visit with your Primary MD  Please request your Primary MD to go over all hospital tests and procedure/radiological results at the follow up, please ask your Primary MD to get all Hospital records sent to his/her office.  If you experience worsening of your admission symptoms, develop shortness of breath, life threatening emergency, suicidal or homicidal thoughts you must seek medical attention immediately by calling 911 or calling your MD immediately  if symptoms less severe.  You must read complete instructions/literature along with all the possible adverse reactions/side effects for all the Medicines you take and that have been prescribed to you. Take any new Medicines after you have completely understood and accpet all the possible adverse reactions/side effects.   Do not drive or operate heavy machinery when taking Pain medications.   Do not take more than prescribed Pain, Sleep and Anxiety Medications  Special Instructions: If you have smoked or chewed Tobacco  in the last 2 yrs please stop smoking, stop any regular Alcohol  and or any Recreational drug use.  Wear Seat belts while driving.  Please note You were cared for by a hospitalist during your hospital stay. If you have any questions about your discharge  medications or the care you received while you were in the hospital after you are discharged, you can call the unit and asked to speak with the hospitalist on call if the hospitalist that took care of you is not available. Once you are discharged, your primary care physician will handle any further medical issues. Please note that NO REFILLS for any discharge medications will be authorized once you are discharged, as it is imperative  that you return to your primary care physician (or establish a relationship with a primary care physician if you do not have one) for your aftercare needs so that they can reassess your need for medications and monitor your lab values.  You can reach the hospitalist office at phone [phone] or fax [phone]   If you do not have a primary care physician, you can call [phone] for a physician referral.  Activity: As tolerated with Full fall precautions use walker/cane & assistance as needed  Diet: diabetic  Disposition Home

## 2017-07-15 NOTE — ED Provider Notes (Signed)
[place] DEPT Provider Note   CSN: [id number] Arrival date & time: 07/03/17  1142     History   Chief Complaint Chief Complaint  Patient presents with  . Emesis  . Headache    HPI Edward Walls is a 36 y.o. male. Chief complaint is vomiting, headache, hyperglycemia  HPI:  This is a 36 year old male with a history of insulin-dependent diabetes. Headache a few days ago. Throbbing right-sided headache. Nausea and vomiting yesterday and today. 2 Tylenol yesterday. Do not take anymore as it was not relieving his symptoms. Vomiting. He was concerned about his blood sugars and vomiting and presents here. No fever. No neck pain.  Past Medical History:  Diagnosis Date  . Allergy   . Diabetes mellitus without complication [facility])     Patient Active Problem List   Diagnosis Date Noted  . Hypokalemia 07/05/2017  . Abnormal weight loss 07/04/2017  . AKI (acute kidney injury) ([place]) 07/03/2017  . Headache 07/03/2017  . Nausea & vomiting 07/03/2017  . Abdominal pain 07/03/2017  . Hyperglycemia 10/20/2016  . DKA (diabetic ketoacidoses) ([place]) 10/20/2016  . Diabetes mellitus type 2 with ketoacidosis, uncontrolled ([place]) 10/20/2016  . Metabolic acidosis 97/35/3299    Past Surgical History:  Procedure Laterality Date  . DENTAL SURGERY         Home Medications    Prior to Admission medications   Medication Sig Start Date End Date Taking? Authorizing Provider  ibuprofen (ADVIL,MOTRIN) 200 MG tablet Take 400 mg by mouth every 6 (six) hours as needed for headache, mild pain or moderate pain.   Yes [provider]  lisinopril (PRINIVIL,ZESTRIL) 2.5 MG tablet Take 1 tablet (2.5 mg total) by mouth daily. 10/23/16  Yes Donne Hazel, MD  blood glucose meter kit and supplies KIT Dispense based on patient and insurance preference. Use up to four times daily as directed. (FOR ICD-9 250.00, 250.01). 07/06/17   Caren Griffins, MD  Continuous Blood Gluc Receiver (FREESTYLE  LIBRE READER) DEVI 1 Device by Does not apply route 4 (four) times daily. 07/06/17   Caren Griffins, MD  Continuous Blood Gluc Sensor (FREESTYLE LIBRE SENSOR SYSTEM) MISC 1 sensor every 10 days 07/06/17   Caren Griffins, MD  Insulin Detemir (LEVEMIR FLEXPEN) 100 UNIT/ML Pen Inject 20 Units into the skin daily at 10 pm. 07/06/17   Caren Griffins, MD  insulin lispro (HUMALOG KWIKPEN) 100 UNIT/ML KiwkPen Inject 0.05 mLs (5 Units total) into the skin 3 (three) times daily. 07/06/17   Caren Griffins, MD  Insulin Pen Needle 31G X 5 MM MISC 1 Device by Does not apply route QID. 07/06/17   Gherghe, Vella Redhead, MD  polyethylene glycol powder (MIRALAX) powder TAKE 6 CAPFULS OF MIRALAX IN A 32 OUNCE GATORADE AND DRINK THE WHOLE BEVERAGE FOLLOWED BY 3 CAPFULS TWICE A DAY FOR THE NEXT WEEK AND FOLLOW UP WITH YOUR PRIMARY CARE PHYSICIAN. Patient not taking: Reported on 10/24/2016 05/05/16   Leo Grosser, MD    Family History Family History  Problem Relation Age of Onset  . Diabetes Mellitus II Mother   . Diabetes Mellitus II Father   . Diabetes Mellitus II Brother     Social History Social History  Substance Use Topics  . Smoking status: Never Smoker  . Smokeless tobacco: Never Used  . Alcohol use No     Allergies   Peanut-containing drug products   Review of Systems Review of Systems  Constitutional: Negative for appetite change, chills, diaphoresis,  fatigue and fever.  HENT: Negative for mouth sores, sore throat and trouble swallowing.   Eyes: Negative for visual disturbance.  Respiratory: Negative for cough, chest tightness, shortness of breath and wheezing.   Cardiovascular: Negative for chest pain.  Gastrointestinal: Positive for nausea. Negative for abdominal distention, abdominal pain, diarrhea and vomiting.  Endocrine: Negative for polydipsia, polyphagia and polyuria.  Genitourinary: Negative for dysuria, frequency and hematuria.  Musculoskeletal: Negative for gait problem.    Skin: Negative for color change, pallor and rash.  Neurological: Positive for weakness and headaches. Negative for dizziness, syncope and light-headedness.  Hematological: Does not bruise/bleed easily.  Psychiatric/Behavioral: Negative for behavioral problems and confusion.     Physical Exam Updated Vital Signs BP 113/76 (BP Location: Left Arm)   Pulse 84   Temp 97.8 F (36.6 C) (Oral)   Resp 17   Ht _0  (1.905 m)   Wt 72.6 kg (160 lb)   SpO2 100%   BMI 20.00 kg/m   Physical Exam  Constitutional: He is oriented to person, place, and time. He appears well-developed and well-nourished. No distress.  Laying on his right side. She states his eyes from the light. No distress.  HENT:  Head: Normocephalic.  Eyes: Pupils are equal, round, and reactive to light. Conjunctivae are normal. No scleral icterus.  Neck: Normal range of motion. Neck supple. No thyromegaly present.  Cardiovascular: Exam reveals no gallop and no friction rub.   No murmur heard. Resting tachycardia.  Pulmonary/Chest: Effort normal and breath sounds normal. No respiratory distress. He has no wheezes. He has no rales.  Abdominal: Soft. Bowel sounds are normal. He exhibits no distension. There is no tenderness. There is no rebound.  Musculoskeletal: Normal range of motion.  Neurological: He is alert and oriented to person, place, and time.  Skin: Skin is warm and dry. No rash noted.  Psychiatric: He has a normal mood and affect. His behavior is normal.     ED Treatments / Results  Labs (all labs ordered are listed, but only abnormal results are displayed) Labs Reviewed  COMPREHENSIVE METABOLIC PANEL - Abnormal; Notable for the following:       Result Value   Sodium 133 (*)    Chloride 96 (*)    CO2 11 (*)    Glucose, Bld 382 (*)    Creatinine, Ser 1.49 (*)    Total Protein 8.7 (*)    Total Bilirubin 1.5 (*)    GFR calc non Af Amer 60 (*)    Anion gap 26 (*)    All other components within normal  limits  URINALYSIS, ROUTINE W REFLEX MICROSCOPIC - Abnormal; Notable for the following:    Color, Urine STRAW (*)    Glucose, UA >=500 (*)    Hgb urine dipstick SMALL (*)    Ketones, ur 80 (*)    Protein, ur 100 (*)    All other components within normal limits  GLUCOSE, CAPILLARY - Abnormal; Notable for the following:    Glucose-Capillary 469 (*)    All other components within normal limits  GLUCOSE, CAPILLARY - Abnormal; Notable for the following:    Glucose-Capillary 421 (*)    All other components within normal limits  HEMOGLOBIN A1C - Abnormal; Notable for the following:    Hgb A1c MFr Bld 14.5 (*)    All other components within normal limits  BASIC METABOLIC PANEL - Abnormal; Notable for the following:    CO2 8 (*)    Glucose, Bld 284 (*)  Creatinine, Ser 1.54 (*)    Calcium 8.5 (*)    GFR calc non Af Amer 57 (*)    Anion gap 25 (*)    All other components within normal limits  BASIC METABOLIC PANEL - Abnormal; Notable for the following:    CO2 9 (*)    Glucose, Bld 227 (*)    Calcium 8.0 (*)    Anion gap 17 (*)    All other components within normal limits  BASIC METABOLIC PANEL - Abnormal; Notable for the following:    CO2 18 (*)    Glucose, Bld 153 (*)    Calcium 8.3 (*)    All other components within normal limits  BASIC METABOLIC PANEL - Abnormal; Notable for the following:    CO2 18 (*)    Glucose, Bld 153 (*)    Calcium 8.4 (*)    All other components within normal limits  GLUCOSE, CAPILLARY - Abnormal; Notable for the following:    Glucose-Capillary 257 (*)    All other components within normal limits  GLUCOSE, CAPILLARY - Abnormal; Notable for the following:    Glucose-Capillary 233 (*)    All other components within normal limits  GLUCOSE, CAPILLARY - Abnormal; Notable for the following:    Glucose-Capillary 268 (*)    All other components within normal limits  GLUCOSE, CAPILLARY - Abnormal; Notable for the following:    Glucose-Capillary 234 (*)     All other components within normal limits  GLUCOSE, CAPILLARY - Abnormal; Notable for the following:    Glucose-Capillary 185 (*)    All other components within normal limits  GLUCOSE, CAPILLARY - Abnormal; Notable for the following:    Glucose-Capillary 206 (*)    All other components within normal limits  GLUCOSE, CAPILLARY - Abnormal; Notable for the following:    Glucose-Capillary 187 (*)    All other components within normal limits  GLUCOSE, CAPILLARY - Abnormal; Notable for the following:    Glucose-Capillary 154 (*)    All other components within normal limits  GLUCOSE, CAPILLARY - Abnormal; Notable for the following:    Glucose-Capillary 140 (*)    All other components within normal limits  GLUCOSE, CAPILLARY - Abnormal; Notable for the following:    Glucose-Capillary 134 (*)    All other components within normal limits  GLUCOSE, CAPILLARY - Abnormal; Notable for the following:    Glucose-Capillary 130 (*)    All other components within normal limits  GLUCOSE, CAPILLARY - Abnormal; Notable for the following:    Glucose-Capillary 145 (*)    All other components within normal limits  GLUCOSE, CAPILLARY - Abnormal; Notable for the following:    Glucose-Capillary 148 (*)    All other components within normal limits  GLUCOSE, CAPILLARY - Abnormal; Notable for the following:    Glucose-Capillary 160 (*)    All other components within normal limits  GLUCOSE, CAPILLARY - Abnormal; Notable for the following:    Glucose-Capillary 149 (*)    All other components within normal limits  GLUCOSE, CAPILLARY - Abnormal; Notable for the following:    Glucose-Capillary 181 (*)    All other components within normal limits  GLUCOSE, CAPILLARY - Abnormal; Notable for the following:    Glucose-Capillary 194 (*)    All other components within normal limits  GLUCOSE, CAPILLARY - Abnormal; Notable for the following:    Glucose-Capillary 254 (*)    All other components within normal limits    BASIC METABOLIC PANEL - Abnormal; Notable for  the following:    Sodium 134 (*)    Potassium 2.7 (*)    CO2 21 (*)    Glucose, Bld 264 (*)    Calcium 8.3 (*)    All other components within normal limits  BASIC METABOLIC PANEL - Abnormal; Notable for the following:    Potassium 3.3 (*)    CO2 21 (*)    Glucose, Bld 157 (*)    Calcium 8.7 (*)    All other components within normal limits  BASIC METABOLIC PANEL - Abnormal; Notable for the following:    CO2 20 (*)    Glucose, Bld 138 (*)    Calcium 8.4 (*)    All other components within normal limits  GLUCOSE, CAPILLARY - Abnormal; Notable for the following:    Glucose-Capillary 243 (*)    All other components within normal limits  GLUCOSE, CAPILLARY - Abnormal; Notable for the following:    Glucose-Capillary 221 (*)    All other components within normal limits  GLUCOSE, CAPILLARY - Abnormal; Notable for the following:    Glucose-Capillary 175 (*)    All other components within normal limits  GLUCOSE, CAPILLARY - Abnormal; Notable for the following:    Glucose-Capillary 107 (*)    All other components within normal limits  GLUCOSE, CAPILLARY - Abnormal; Notable for the following:    Glucose-Capillary 122 (*)    All other components within normal limits  GLUCOSE, CAPILLARY - Abnormal; Notable for the following:    Glucose-Capillary 128 (*)    All other components within normal limits  GLUCOSE, CAPILLARY - Abnormal; Notable for the following:    Glucose-Capillary 133 (*)    All other components within normal limits  GLUCOSE, CAPILLARY - Abnormal; Notable for the following:    Glucose-Capillary 216 (*)    All other components within normal limits  GLUCOSE, CAPILLARY - Abnormal; Notable for the following:    Glucose-Capillary 402 (*)    All other components within normal limits  GLUCOSE, CAPILLARY - Abnormal; Notable for the following:    Glucose-Capillary 312 (*)    All other components within normal limits  GLUCOSE,  CAPILLARY - Abnormal; Notable for the following:    Glucose-Capillary 320 (*)    All other components within normal limits  GLUCOSE, CAPILLARY - Abnormal; Notable for the following:    Glucose-Capillary 293 (*)    All other components within normal limits  CBG MONITORING, ED - Abnormal; Notable for the following:    Glucose-Capillary 349 (*)    All other components within normal limits  MRSA PCR SCREENING  CBC WITH DIFFERENTIAL/PLATELET  LIPASE, BLOOD  TROPONIN I  CREATININE, URINE, RANDOM  SODIUM, URINE, RANDOM  HIV ANTIBODY (ROUTINE TESTING)  GLUTAMIC ACID DECARBOXYLASE AUTO ABS  GLUCOSE, CAPILLARY  I-STAT CG4 LACTIC ACID, ED  CG4 I-STAT (LACTIC ACID)    EKG   Radiology No results found.  Procedures Procedures (including critical care time)  Medications Ordered in ED Medications  potassium chloride 10 mEq in 100 mL IVPB (10 mEq Intravenous Not Given 07/03/17 2100)  ondansetron (ZOFRAN-ODT) disintegrating tablet 4 mg (4 mg Oral Given 07/03/17 1306)  ibuprofen (ADVIL,MOTRIN) tablet 400 mg (400 mg Oral Given 07/03/17 1306)  sodium chloride 0.9 % bolus 1,000 mL (0 mLs Intravenous Stopped 07/03/17 1912)  metoCLOPramide (REGLAN) injection 10 mg (10 mg Intravenous Given 07/03/17 1811)  diphenhydrAMINE (BENADRYL) injection 25 mg (25 mg Intravenous Given 07/03/17 1811)  sodium chloride 0.9 % bolus 2,000 mL (0 mLs Intravenous Stopped 07/03/17  2308)  metoCLOPramide (REGLAN) injection 10 mg (10 mg Intravenous Given 07/03/17 2225)  potassium chloride 20 MEQ/15ML (10%) solution 20 mEq (20 mEq Oral Given 07/03/17 2333)  potassium chloride 20 MEQ/15ML (10%) solution 40 mEq (40 mEq Oral Given 07/04/17 2234)  potassium chloride 20 MEQ/15ML (10%) solution 40 mEq (40 mEq Oral Given 07/05/17 0139)     Initial Impression / Assessment and Plan / ED Course  I have reviewed the triage vital signs and the nursing notes.  Pertinent labs & imaging results that were available during my care of the  patient were reviewed by me and considered in my medical decision making (see chart for details).    CT was head is obtained and normal. Labs showed DKA. Given a fluid bolus. Given pain medication and antiemetics. Initiated and glucose stabilizer. Care discussed with hospitalist. He will be admitted.  Final Clinical Impressions(s) / ED Diagnoses   Final diagnoses:  Diabetic ketoacidosis without coma associated with type 1 diabetes mellitus ([place])     CRITICAL CARE Performed by: Tanna Furry JOSEPH   Total critical care time: 30 minutes  Critical care time was exclusive of separately billable procedures and treating other patients.  Critical care was necessary to treat or prevent imminent or life-threatening deterioration.  Critical care was time spent personally by me on the following activities: development of treatment plan with patient and/or surrogate as well as nursing, discussions with consultants, evaluation of patient's response to treatment, examination of patient, obtaining history from patient or surrogate, ordering and performing treatments and interventions, ordering and review of laboratory studies, ordering and review of radiographic studies, pulse oximetry and re-evaluation of patient's condition.  New Prescriptions Discharge Medication List as of 07/06/2017  9:54 AM    START taking these medications   Details  Continuous Blood Gluc Receiver (FREESTYLE LIBRE READER) DEVI 1 Device by Does not apply route 4 (four) times daily., Starting Fri 07/06/2017, Print    Continuous Blood Gluc Sensor ([place]) MISC 1 sensor every 10 days, Print         Tanna Furry, MD 07/15/17 2038

## 2017-07-19 ENCOUNTER — Ambulatory Visit: Payer: Self-pay | Admitting: Family Medicine

## 2017-07-23 ENCOUNTER — Ambulatory Visit (INDEPENDENT_AMBULATORY_CARE_PROVIDER_SITE_OTHER): Payer: Managed Care, Other (non HMO) | Admitting: Family Medicine

## 2017-07-23 ENCOUNTER — Encounter: Payer: Self-pay | Admitting: Family Medicine

## 2017-07-23 VITALS — BP 104/56 | HR 86 | Resp 16 | Ht 75.0 in | Wt 189.2 lb

## 2017-07-23 DIAGNOSIS — Z23 Encounter for immunization: Secondary | ICD-10-CM | POA: Diagnosis not present

## 2017-07-23 DIAGNOSIS — E1165 Type 2 diabetes mellitus with hyperglycemia: Secondary | ICD-10-CM

## 2017-07-23 DIAGNOSIS — Z794 Long term (current) use of insulin: Secondary | ICD-10-CM

## 2017-07-23 NOTE — Progress Notes (Signed)
Subjective:  By signing my name below, I, Moises Blood, attest that this documentation has been prepared under the direction and in the presence of Merri Ray, MD. Electronically Signed: Moises Blood, [place]. 07/23/2017 , 4:20 PM .  Patient was seen in Room 12 .   Patient ID: Edward Walls, male    DOB: 09-16-1981, 36 y.o.   MRN: [id number] Chief Complaint  Patient presents with  . Diabetes    diabetes check    HPI Edward Walls is a 36 y.o. male Here for follow up on diabetes. He was diagnosed in January. I haven't seen patient since 2016 for acute illness at that time. He was admitted from Sept 18th through 21st with DKA- history of poorly controlled diabetes. His glucose was 382 and bicarb at 11. He had AKI with creatinine of 1.49, improved with hydration. Thought to be due to non compliance with insulin therapy. He had been able to come off of insulin previously with exercise, but then decreased exercise with increase in blood sugars. He was prescribed with continuous CBG monitor, discharged on Levemir 20 units QHS, Humalog 5 units TID with meals, and lisinopril 2.9m QD.   Patient reports his sugars still running high, but down to about 200s after eating. He didn't buy the continuous blood glucose monitor due to cost. He denies any further nausea, vomiting or abdominal pain.   Vision: He denies having an eye doctor; hasn't seen one prior.  Flu shot: Will update today.  Pneumovax: Will update today.  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   Patient Active Problem List   Diagnosis Date Noted  . Hypokalemia 07/05/2017  . Abnormal weight loss 07/04/2017  . AKI (acute kidney injury) ([facility] 07/03/2017  . Headache 07/03/2017  . Nausea & vomiting 07/03/2017  . Abdominal pain 07/03/2017  . Hyperglycemia 10/20/2016  .  DKA (diabetic ketoacidoses) ([facility] 10/20/2016  . Diabetes mellitus type 2 with ketoacidosis, uncontrolled ([facility] 10/20/2016  . Metabolic acidosis 052/84/1324  Past Medical History:  Diagnosis Date  . Allergy   . Diabetes mellitus without complication ([facility]    Past Surgical History:  Procedure Laterality Date  . DENTAL SURGERY     Allergies  Allergen Reactions  . Peanut-Containing Drug Products Other (See Comments)    Throat burning    Prior to Admission medications   Medication Sig Start Date End Date Taking? Authorizing Provider  blood glucose meter kit and supplies KIT Dispense based on patient and insurance preference. Use up to four times daily as directed. (FOR ICD-9 250.00, 250.01). 07/06/17   GCaren Griffins MD  Continuous Blood Gluc Receiver (FREESTYLE LIBRE READER) DEVI 1 Device by Does not apply route 4 (four) times daily. 07/06/17   GCaren Griffins MD  Continuous Blood Gluc Sensor (FREESTYLE LIBRE SENSOR SYSTEM) MISC 1 sensor every 10 days 07/06/17   GCaren Griffins MD  ibuprofen (ADVIL,MOTRIN) 200 MG tablet Take 400 mg by mouth every 6 (six) hours as needed for headache, mild pain or moderate pain.    [provider]  Insulin Detemir (LEVEMIR FLEXPEN) 100 UNIT/ML Pen Inject 20 Units into the skin daily at 10 pm. 07/06/17   GCaren Griffins MD  insulin lispro (HUMALOG KWIKPEN) 100 UNIT/ML KiwkPen Inject 0.05 mLs (5 Units total) into the skin 3 (three)  times daily. 07/06/17   Caren Griffins, MD  Insulin Pen Needle 31G X 5 MM MISC 1 Device by Does not apply route QID. 07/06/17   Gherghe, Vella Redhead, MD  lisinopril (PRINIVIL,ZESTRIL) 2.5 MG tablet Take 1 tablet (2.5 mg total) by mouth daily. 10/23/16   Donne Hazel, MD  polyethylene glycol powder (MIRALAX) powder TAKE 6 CAPFULS OF MIRALAX IN A 32 OUNCE GATORADE AND DRINK THE WHOLE BEVERAGE FOLLOWED BY 3 CAPFULS TWICE A DAY FOR THE NEXT WEEK AND FOLLOW UP WITH YOUR PRIMARY CARE PHYSICIAN. Patient not taking: Reported  on 10/24/2016 05/05/16   Leo Grosser, MD   Social History   Social History  . Marital status: Single    Spouse name: N/A  . Number of children: N/A  . Years of education: N/A   Occupational History  . Not on file.   Social History Main Topics  . Smoking status: Never Smoker  . Smokeless tobacco: Never Used  . Alcohol use No  . Drug use: No  . Sexual activity: Not on file   Other Topics Concern  . Not on file   Social History Narrative  . No narrative on file   Review of Systems  Constitutional: Negative for fatigue, fever and unexpected weight change.  Eyes: Negative for visual disturbance.  Respiratory: Negative for cough, chest tightness and shortness of breath.   Cardiovascular: Negative for chest pain, palpitations and leg swelling.  Gastrointestinal: Negative for abdominal pain, blood in stool, nausea and vomiting.  Neurological: Negative for dizziness, light-headedness and headaches.       Objective:   Physical Exam  Constitutional: He is oriented to person, place, and time. He appears well-developed and well-nourished. No distress.  HENT:  Head: Normocephalic and atraumatic.  Eyes: Pupils are equal, round, and reactive to light. EOM are normal.  Neck: Neck supple.  Cardiovascular: Normal rate.   Pulmonary/Chest: Effort normal. No respiratory distress.  Musculoskeletal: Normal range of motion.  Neurological: He is alert and oriented to person, place, and time.  Skin: Skin is warm and dry.  Psychiatric: He has a normal mood and affect. His behavior is normal.  Nursing note and vitals reviewed.   Vitals:   07/23/17 1557  BP: (!) 104/56  Pulse: 86  Resp: 16  SpO2: 98%  Weight: 189 lb 3.2 oz (85.8 kg)  Height: 6' 3" (1.905 m)   Diabetic Foot Exam - Simple   Simple Foot Form Diabetic Foot exam was performed with the following findings:  Yes 07/23/2017  3:59 PM  Visual Inspection No deformities, no ulcerations, no other skin breakdown bilaterally:   Yes Sensation Testing Intact to touch and monofilament testing bilaterally:  Yes Pulse Check Posterior Tibialis and Dorsalis pulse intact bilaterally:  Yes Comments        Assessment & Plan:   Edward Walls is a 36 y.o. male Type 2 diabetes mellitus with hyperglycemia, with long-term current use of insulin ([place]) - Plan: Ambulatory referral to Ophthalmology, CANCELED: Ambulatory referral to Ophthalmology  - Improving now that he is back on insulin, long-acting as well as mealtime. Tablet-continue Levemir, but increase by 2 units every 2-3 days until fastings remain below 200. Continue 5 units of Humalog with meals for now. Potential for hypoglycemia discussed as well as warning signs and treatment.  -Pneumovax and flu vaccine given, recheck in 2 weeks with home readings.  -Refer to ophthalmology for retinopathy screening.  Flu vaccine need - Plan: Flu Vaccine QUAD 36+ mos IM  Need for prophylactic vaccination against Streptococcus pneumoniae (pneumococcus) - Plan: Pneumococcal polysaccharide vaccine 23-valent greater than or equal to 2yo subcutaneous/IM   No orders of the defined types were placed in this encounter.  Patient Instructions    If fasting blood sugars are remaining over 200, increase the Levemir by 2 Units every 2-3 days until fastings read below 200 (then stay at that level).  Continue 5 units of Humalog with meals for now. Recheck in 2 weeks with home readings. Check readings fating and  2 hours after meals once or twice per day and return with those readings in next 2 weeks. Watch for low blood sugar symptoms. See information on hypoglycemia below.  I'll refer you to an eye care specialist to screen eyes.   Flu and pneumonia vaccines given today. Check into date of last tetanus vaccine and let me know next visit.   We can check a urine test next visit and can discuss further at that time regarding type I versus type 2 diabetes and other possible testing. We can  also decide based on your levels whether endocrinology referral may be needed.  Thanks for coming in today.   Diabetes Mellitus and Standards of Medical Care Managing diabetes (diabetes mellitus) can be complicated. Your diabetes treatment may be managed by a team of health care providers, including:  A diet and nutrition specialist (registered dietitian).  A nurse.  A certified diabetes educator (CDE).  A diabetes specialist (endocrinologist).  An eye doctor.  A primary care provider.  A dentist.  Your health care providers follow a schedule in order to help you get the best quality of care. The following schedule is a general guideline for your diabetes management plan. Your health care providers may also give you more specific instructions. HbA1c ( hemoglobin A1c) test This test provides information about blood sugar (glucose) control over the previous 2-3 months. It is used to check whether your diabetes management plan needs to be adjusted.  If you are meeting your treatment goals, this test is done at least 2 times a year.  If you are not meeting treatment goals or if your treatment goals have changed, this test is done 4 times a year.  Blood pressure test  This test is done at every routine medical visit. For most people, the goal is less than 130/80. Ask your health care provider what your goal blood pressure should be. Dental and eye exams  Visit your dentist two times a year.  If you have type 1 diabetes, get an eye exam 3-5 years after you are diagnosed, and then once a year after your first exam. ? If you were diagnosed with type 1 diabetes as a child, get an eye exam when you are age 61 or older and have had diabetes for 3-5 years. After the first exam, you should get an eye exam once a year.  If you have type 2 diabetes, have an eye exam as soon as you are diagnosed, and then once a year after your first exam. Foot care exam  Visual foot exams are done at  every routine medical visit. The exams check for cuts, bruises, redness, blisters, sores, or other problems with the feet.  A complete foot exam is done by your health care provider once a year. This exam includes an inspection of the structure and skin of your feet, and a check of the pulses and sensation in your feet. ? Type 1 diabetes: Get your first exam 3-5  years after diagnosis. ? Type 2 diabetes: Get your first exam as soon as you are diagnosed.  Check your feet every day for cuts, bruises, redness, blisters, or sores. If you have any of these or other problems that are not healing, contact your health care provider. Kidney function test ( urine microalbumin)  This test is done once a year. ? Type 1 diabetes: Get your first test 5 years after diagnosis. ? Type 2 diabetes: Get your first test as soon as you are diagnosed.  If you have chronic kidney disease (CKD), get a serum creatinine and estimated glomerular filtration rate (eGFR) test once a year. Lipid profile (cholesterol, HDL, LDL, triglycerides)  This test should be done when you are diagnosed with diabetes, and every 5 years after the first test. If you are on medicines to lower your cholesterol, you may need to get this test done every year. ? The goal for LDL is less than 100 mg/dL (5.5 mmol/L). If you are at high risk, the goal is less than 70 mg/dL (3.9 mmol/L). ? The goal for HDL is 40 mg/dL (2.2 mmol/L) for men and 50 mg/dL(2.8 mmol/L) for women. An HDL cholesterol of 60 mg/dL (3.3 mmol/L) or higher gives some protection against heart disease. ? The goal for triglycerides is less than 150 mg/dL (8.3 mmol/L). Immunizations  The yearly flu (influenza) vaccine is recommended for everyone 6 months or older who has diabetes.  The pneumonia (pneumococcal) vaccine is recommended for everyone 2 years or older who has diabetes. If you are 97 or older, you may get the pneumonia vaccine as a series of two separate shots.  The  hepatitis B vaccine is recommended for adults shortly after they have been diagnosed with diabetes.  The Tdap (tetanus, diphtheria, and pertussis) vaccine should be given: ? According to normal childhood vaccination schedules, for children. ? Every 10 years, for adults who have diabetes.  The shingles vaccine is recommended for people who have had chicken pox and are 50 years or older. Mental and emotional health  Screening for symptoms of eating disorders, anxiety, and depression is recommended at the time of diagnosis and afterward as needed. If your screening shows that you have symptoms (you have a positive screening result), you may need further evaluation and be referred to a mental health care provider. Diabetes self-management education  Education about how to manage your diabetes is recommended at diagnosis and ongoing as needed. Treatment plan  Your treatment plan will be reviewed at every medical visit. Summary  Managing diabetes (diabetes mellitus) can be complicated. Your diabetes treatment may be managed by a team of health care providers.  Your health care providers follow a schedule in order to help you get the best quality of care.  Standards of care including having regular physical exams, blood tests, blood pressure monitoring, immunizations, screening tests, and education about how to manage your diabetes.  Your health care providers may also give you more specific instructions based on your individual health. This information is not intended to replace advice given to you by your health care provider. Make sure you discuss any questions you have with your health care provider. Document Released: 07/30/2009 Document Revised: 06/30/2016 Document Reviewed: 06/30/2016 Elsevier Interactive Patient Education  2018 [employer].    Hypoglycemia Hypoglycemia occurs when the level of sugar (glucose) in the blood is too low. Glucose is a type of sugar that provides the  body's main source of energy. Certain hormones (insulin and glucagon) control  the level of glucose in the blood. Insulin lowers blood glucose, and glucagon increases blood glucose. Hypoglycemia can result from having too much insulin in the bloodstream, or from not eating enough food that contains glucose. Hypoglycemia can happen in people who do or do not have diabetes. It can develop quickly, and it can be a medical emergency. What are the causes? Hypoglycemia occurs most often in people who have diabetes. If you have diabetes, hypoglycemia may be caused by:  Diabetes medicine.  Not eating enough, or not eating often enough.  Increased physical activity.  Drinking alcohol, especially when you have not eaten recently.  If you do not have diabetes, hypoglycemia may be caused by:  A tumor in the pancreas. The pancreas is the organ that makes insulin.  Not eating enough, or not eating for long periods at a time (fasting).  Severe infection or illness that affects the liver, heart, or kidneys.  Certain medicines.  You may also have reactive hypoglycemia. This condition causes hypoglycemia within 4 hours of eating a meal. This may occur after having stomach surgery. Sometimes, the cause of reactive hypoglycemia is not known. What increases the risk? Hypoglycemia is more likely to develop in:  People who have diabetes and take medicines to lower blood glucose.  People who abuse alcohol.  People who have a severe illness.  What are the signs or symptoms? Hypoglycemia may not cause any symptoms. If you have symptoms, they may include:  Hunger.  Anxiety.  Sweating and feeling clammy.  Confusion.  Dizziness or feeling light-headed.  Sleepiness.  Nausea.  Increased heart rate.  Headache.  Blurry vision.  Seizure.  Nightmares.  Tingling or numbness around the mouth, lips, or tongue.  A change in speech.  Decreased ability to concentrate.  A change in  coordination.  Restless sleep.  Tremors or shakes.  Fainting.  Irritability.  How is this diagnosed? Hypoglycemia is diagnosed with a blood test to measure your blood glucose level. This blood test is done while you are having symptoms. Your health care provider may also do a physical exam and review your medical history. If you do not have diabetes, other tests may be done to find the cause of your hypoglycemia. How is this treated? This condition can often be treated by immediately eating or drinking something that contains glucose, such as:  3-4 sugar tablets (glucose pills).  Glucose gel, 15-gram tube.  Fruit juice, 4 oz (120 mL).  Regular soda (not diet soda), 4 oz (120 mL).  Low-fat milk, 4 oz (120 mL).  Several pieces of hard candy.  Sugar or honey, 1 Tbsp.  Treating Hypoglycemia If You Have Diabetes  If you are alert and able to swallow safely, follow the 15:15 rule:  Take 15 grams of a rapid-acting carbohydrate. Rapid-acting options include: ? 1 tube of glucose gel. ? 3 glucose pills. ? 6-8 pieces of hard candy. ? 4 oz (120 mL) of fruit juice. ? 4 oz (120 ml) of regular (not diet) soda.  Check your blood glucose 15 minutes after you take the carbohydrate.  If the repeat blood glucose level is still at or below 70 mg/dL (3.9 mmol/L), take 15 grams of a carbohydrate again.  If your blood glucose level does not increase above 70 mg/dL (3.9 mmol/L) after 3 tries, seek emergency medical care.  After your blood glucose level returns to normal, eat a meal or a snack within 1 hour.  Treating Severe Hypoglycemia Severe hypoglycemia is when your blood  glucose level is at or below 54 mg/dL (3 mmol/L). Severe hypoglycemia is an emergency. Do not wait to see if the symptoms will go away. Get medical help right away. Call your local emergency services (911 in the [place] Do not drive yourself to the hospital. If you have severe hypoglycemia and you cannot eat or drink,  you may need an injection of glucagon. A family member or close friend should learn how to check your blood glucose and how to give you a glucagon injection. Ask your health care provider if you need to have an emergency glucagon injection kit available. Severe hypoglycemia may need to be treated in a hospital. The treatment may include getting glucose through an IV tube. You may also need treatment for the cause of your hypoglycemia. Follow these instructions at home: General instructions  Avoid any diets that cause you to not eat enough food. Talk with your health care provider before you start any new diet.  Take over-the-counter and prescription medicines only as told by your health care provider.  Limit alcohol intake to no more than 1 drink per day for nonpregnant women and 2 drinks per day for men. One drink equals 12 oz of beer, 5 oz of wine, or 1 oz of hard liquor.  Keep all follow-up visits as told by your health care provider. This is important. If You Have Diabetes:   Make sure you know the symptoms of hypoglycemia.  Always have a rapid-acting carbohydrate snack with you to treat low blood sugar.  Follow your diabetes management plan, as told by your health care provider. Make sure you: ? Take your medicines as directed. ? Follow your exercise plan. ? Follow your meal plan. Eat on time, and do not skip meals. ? Check your blood glucose as often as directed. Make sure to check your blood glucose before and after exercise. If you exercise longer or in a different way than usual, check your blood glucose more often. ? Follow your sick day plan whenever you cannot eat or drink normally. Make this plan in advance with your health care provider.  Share your diabetes management plan with people in your workplace, school, and household.  Check your urine for ketones when you are ill and as told by your health care provider.  Carry a medical alert card or wear medical alert  jewelry. If You Have Reactive Hypoglycemia or Low Blood Sugar From Other Causes:  Monitor your blood glucose as told by your health care provider.  Follow instructions from your health care provider about eating or drinking restrictions. Contact a health care provider if:  You have problems keeping your blood glucose in your target range.  You have frequent episodes of hypoglycemia. Get help right away if:  You continue to have hypoglycemia symptoms after eating or drinking something containing glucose.  Your blood glucose is at or below 54 mg/dL (3 mmol/L).  You have a seizure.  You faint. These symptoms may represent a serious problem that is an emergency. Do not wait to see if the symptoms will go away. Get medical help right away. Call your local emergency services (911 in the [place] Do not drive yourself to the hospital. This information is not intended to replace advice given to you by your health care provider. Make sure you discuss any questions you have with your health care provider. Document Released: 10/02/2005 Document Revised: 03/15/2016 Document Reviewed: 11/05/2015 Elsevier Interactive Patient Education  2018 [employer].  IF you received  an x-ray today, you will receive an invoice from [facility]. Please contact [facility] at [phone] with questions or concerns regarding your invoice.   IF you received labwork today, you will receive an invoice from [facility]. Please contact [facility] at [phone] with questions or concerns regarding your invoice.   Our billing staff will not be able to assist you with questions regarding bills from these companies.  You will be contacted with the lab results as soon as they are available. The fastest way to get your results is to activate your My Chart account. Instructions are located on the last page of this paperwork. If you have not heard from [place] regarding the results in 2 weeks, please contact this  office.       I personally performed the services described in this documentation, which was scribed in my presence. The recorded information has been reviewed and considered for accuracy and completeness, addended by me as needed, and agree with information above.  Signed,   Merri Ray, MD [facility].  07/26/17 9:34 AM

## 2017-07-23 NOTE — Patient Instructions (Addendum)
If fasting blood sugars are remaining over 200, increase the Levemir by 2 Units every 2-3 days until fastings read below 200 (then stay at that level).  Continue 5 units of Humalog with meals for now. Recheck in 2 weeks with home readings. Check readings fating and  2 hours after meals once or twice per day and return with those readings in next 2 weeks. Watch for low blood sugar symptoms. See information on hypoglycemia below.  I'll refer you to an eye care specialist to screen eyes.   Flu and pneumonia vaccines given today. Check into date of last tetanus vaccine and let me know next visit.   We can check a urine test next visit and can discuss further at that time regarding type I versus type 2 diabetes and other possible testing. We can also decide based on your levels whether endocrinology referral may be needed.  Thanks for coming in today.   Diabetes Mellitus and Standards of Medical Care Managing diabetes (diabetes mellitus) can be complicated. Your diabetes treatment may be managed by a team of health care providers, including:  A diet and nutrition specialist (registered dietitian).  A nurse.  A certified diabetes educator (CDE).  A diabetes specialist (endocrinologist).  An eye doctor.  A primary care provider.  A dentist.  Your health care providers follow a schedule in order to help you get the best quality of care. The following schedule is a general guideline for your diabetes management plan. Your health care providers may also give you more specific instructions. HbA1c ( hemoglobin A1c) test This test provides information about blood sugar (glucose) control over the previous 2-3 months. It is used to check whether your diabetes management plan needs to be adjusted.  If you are meeting your treatment goals, this test is done at least 2 times a year.  If you are not meeting treatment goals or if your treatment goals have changed, this test is done 4 times a  year.  Blood pressure test  This test is done at every routine medical visit. For most people, the goal is less than 130/80. Ask your health care provider what your goal blood pressure should be. Dental and eye exams  Visit your dentist two times a year.  If you have type 1 diabetes, get an eye exam 3-5 years after you are diagnosed, and then once a year after your first exam. ? If you were diagnosed with type 1 diabetes as a child, get an eye exam when you are age 60 or older and have had diabetes for 3-5 years. After the first exam, you should get an eye exam once a year.  If you have type 2 diabetes, have an eye exam as soon as you are diagnosed, and then once a year after your first exam. Foot care exam  Visual foot exams are done at every routine medical visit. The exams check for cuts, bruises, redness, blisters, sores, or other problems with the feet.  A complete foot exam is done by your health care provider once a year. This exam includes an inspection of the structure and skin of your feet, and a check of the pulses and sensation in your feet. ? Type 1 diabetes: Get your first exam 3-5 years after diagnosis. ? Type 2 diabetes: Get your first exam as soon as you are diagnosed.  Check your feet every day for cuts, bruises, redness, blisters, or sores. If you have any of these or other problems that are  not healing, contact your health care provider. Kidney function test ( urine microalbumin)  This test is done once a year. ? Type 1 diabetes: Get your first test 5 years after diagnosis. ? Type 2 diabetes: Get your first test as soon as you are diagnosed.  If you have chronic kidney disease (CKD), get a serum creatinine and estimated glomerular filtration rate (eGFR) test once a year. Lipid profile (cholesterol, HDL, LDL, triglycerides)  This test should be done when you are diagnosed with diabetes, and every 5 years after the first test. If you are on medicines to lower your  cholesterol, you may need to get this test done every year. ? The goal for LDL is less than 100 mg/dL (5.5 mmol/L). If you are at high risk, the goal is less than 70 mg/dL (3.9 mmol/L). ? The goal for HDL is 40 mg/dL (2.2 mmol/L) for men and 50 mg/dL(2.8 mmol/L) for women. An HDL cholesterol of 60 mg/dL (3.3 mmol/L) or higher gives some protection against heart disease. ? The goal for triglycerides is less than 150 mg/dL (8.3 mmol/L). Immunizations  The yearly flu (influenza) vaccine is recommended for everyone 6 months or older who has diabetes.  The pneumonia (pneumococcal) vaccine is recommended for everyone 2 years or older who has diabetes. If you are 68 or older, you may get the pneumonia vaccine as a series of two separate shots.  The hepatitis B vaccine is recommended for adults shortly after they have been diagnosed with diabetes.  The Tdap (tetanus, diphtheria, and pertussis) vaccine should be given: ? According to normal childhood vaccination schedules, for children. ? Every 10 years, for adults who have diabetes.  The shingles vaccine is recommended for people who have had chicken pox and are 50 years or older. Mental and emotional health  Screening for symptoms of eating disorders, anxiety, and depression is recommended at the time of diagnosis and afterward as needed. If your screening shows that you have symptoms (you have a positive screening result), you may need further evaluation and be referred to a mental health care provider. Diabetes self-management education  Education about how to manage your diabetes is recommended at diagnosis and ongoing as needed. Treatment plan  Your treatment plan will be reviewed at every medical visit. Summary  Managing diabetes (diabetes mellitus) can be complicated. Your diabetes treatment may be managed by a team of health care providers.  Your health care providers follow a schedule in order to help you get the best quality of  care.  Standards of care including having regular physical exams, blood tests, blood pressure monitoring, immunizations, screening tests, and education about how to manage your diabetes.  Your health care providers may also give you more specific instructions based on your individual health. This information is not intended to replace advice given to you by your health care provider. Make sure you discuss any questions you have with your health care provider. Document Released: 07/30/2009 Document Revised: 06/30/2016 Document Reviewed: 06/30/2016 Elsevier Interactive Patient Education  2018 [employer].    Hypoglycemia Hypoglycemia occurs when the level of sugar (glucose) in the blood is too low. Glucose is a type of sugar that provides the body's main source of energy. Certain hormones (insulin and glucagon) control the level of glucose in the blood. Insulin lowers blood glucose, and glucagon increases blood glucose. Hypoglycemia can result from having too much insulin in the bloodstream, or from not eating enough food that contains glucose. Hypoglycemia can happen in people  who do or do not have diabetes. It can develop quickly, and it can be a medical emergency. What are the causes? Hypoglycemia occurs most often in people who have diabetes. If you have diabetes, hypoglycemia may be caused by:  Diabetes medicine.  Not eating enough, or not eating often enough.  Increased physical activity.  Drinking alcohol, especially when you have not eaten recently.  If you do not have diabetes, hypoglycemia may be caused by:  A tumor in the pancreas. The pancreas is the organ that makes insulin.  Not eating enough, or not eating for long periods at a time (fasting).  Severe infection or illness that affects the liver, heart, or kidneys.  Certain medicines.  You may also have reactive hypoglycemia. This condition causes hypoglycemia within 4 hours of eating a meal. This may occur after  having stomach surgery. Sometimes, the cause of reactive hypoglycemia is not known. What increases the risk? Hypoglycemia is more likely to develop in:  People who have diabetes and take medicines to lower blood glucose.  People who abuse alcohol.  People who have a severe illness.  What are the signs or symptoms? Hypoglycemia may not cause any symptoms. If you have symptoms, they may include:  Hunger.  Anxiety.  Sweating and feeling clammy.  Confusion.  Dizziness or feeling light-headed.  Sleepiness.  Nausea.  Increased heart rate.  Headache.  Blurry vision.  Seizure.  Nightmares.  Tingling or numbness around the mouth, lips, or tongue.  A change in speech.  Decreased ability to concentrate.  A change in coordination.  Restless sleep.  Tremors or shakes.  Fainting.  Irritability.  How is this diagnosed? Hypoglycemia is diagnosed with a blood test to measure your blood glucose level. This blood test is done while you are having symptoms. Your health care provider may also do a physical exam and review your medical history. If you do not have diabetes, other tests may be done to find the cause of your hypoglycemia. How is this treated? This condition can often be treated by immediately eating or drinking something that contains glucose, such as:  3-4 sugar tablets (glucose pills).  Glucose gel, 15-gram tube.  Fruit juice, 4 oz (120 mL).  Regular soda (not diet soda), 4 oz (120 mL).  Low-fat milk, 4 oz (120 mL).  Several pieces of hard candy.  Sugar or honey, 1 Tbsp.  Treating Hypoglycemia If You Have Diabetes  If you are alert and able to swallow safely, follow the 15:15 rule:  Take 15 grams of a rapid-acting carbohydrate. Rapid-acting options include: ? 1 tube of glucose gel. ? 3 glucose pills. ? 6-8 pieces of hard candy. ? 4 oz (120 mL) of fruit juice. ? 4 oz (120 ml) of regular (not diet) soda.  Check your blood glucose 15 minutes  after you take the carbohydrate.  If the repeat blood glucose level is still at or below 70 mg/dL (3.9 mmol/L), take 15 grams of a carbohydrate again.  If your blood glucose level does not increase above 70 mg/dL (3.9 mmol/L) after 3 tries, seek emergency medical care.  After your blood glucose level returns to normal, eat a meal or a snack within 1 hour.  Treating Severe Hypoglycemia Severe hypoglycemia is when your blood glucose level is at or below 54 mg/dL (3 mmol/L). Severe hypoglycemia is an emergency. Do not wait to see if the symptoms will go away. Get medical help right away. Call your local emergency services (911 in the [place]  Do not drive yourself to the hospital. If you have severe hypoglycemia and you cannot eat or drink, you may need an injection of glucagon. A family member or close friend should learn how to check your blood glucose and how to give you a glucagon injection. Ask your health care provider if you need to have an emergency glucagon injection kit available. Severe hypoglycemia may need to be treated in a hospital. The treatment may include getting glucose through an IV tube. You may also need treatment for the cause of your hypoglycemia. Follow these instructions at home: General instructions  Avoid any diets that cause you to not eat enough food. Talk with your health care provider before you start any new diet.  Take over-the-counter and prescription medicines only as told by your health care provider.  Limit alcohol intake to no more than 1 drink per day for nonpregnant women and 2 drinks per day for men. One drink equals 12 oz of beer, 5 oz of wine, or 1 oz of hard liquor.  Keep all follow-up visits as told by your health care provider. This is important. If You Have Diabetes:   Make sure you know the symptoms of hypoglycemia.  Always have a rapid-acting carbohydrate snack with you to treat low blood sugar.  Follow your diabetes management plan, as told  by your health care provider. Make sure you: ? Take your medicines as directed. ? Follow your exercise plan. ? Follow your meal plan. Eat on time, and do not skip meals. ? Check your blood glucose as often as directed. Make sure to check your blood glucose before and after exercise. If you exercise longer or in a different way than usual, check your blood glucose more often. ? Follow your sick day plan whenever you cannot eat or drink normally. Make this plan in advance with your health care provider.  Share your diabetes management plan with people in your workplace, school, and household.  Check your urine for ketones when you are ill and as told by your health care provider.  Carry a medical alert card or wear medical alert jewelry. If You Have Reactive Hypoglycemia or Low Blood Sugar From Other Causes:  Monitor your blood glucose as told by your health care provider.  Follow instructions from your health care provider about eating or drinking restrictions. Contact a health care provider if:  You have problems keeping your blood glucose in your target range.  You have frequent episodes of hypoglycemia. Get help right away if:  You continue to have hypoglycemia symptoms after eating or drinking something containing glucose.  Your blood glucose is at or below 54 mg/dL (3 mmol/L).  You have a seizure.  You faint. These symptoms may represent a serious problem that is an emergency. Do not wait to see if the symptoms will go away. Get medical help right away. Call your local emergency services (911 in the [place] Do not drive yourself to the hospital. This information is not intended to replace advice given to you by your health care provider. Make sure you discuss any questions you have with your health care provider. Document Released: 10/02/2005 Document Revised: 03/15/2016 Document Reviewed: 11/05/2015 Elsevier Interactive Patient Education  2018 [employer].  IF you received  an x-ray today, you will receive an invoice from [facility]. Please contact [facility] at [phone] with questions or concerns regarding your invoice.   IF you received labwork today, you will receive an invoice from [place]. Please contact  [facility] at [phone] with questions or concerns regarding your invoice.   Our billing staff will not be able to assist you with questions regarding bills from these companies.  You will be contacted with the lab results as soon as they are available. The fastest way to get your results is to activate your My Chart account. Instructions are located on the last page of this paperwork. If you have not heard from [place] regarding the results in 2 weeks, please contact this office.

## 2017-08-20 ENCOUNTER — Ambulatory Visit: Payer: Managed Care, Other (non HMO) | Admitting: Family Medicine

## 2017-08-30 ENCOUNTER — Encounter: Payer: Self-pay | Admitting: Family Medicine

## 2017-08-30 ENCOUNTER — Other Ambulatory Visit: Payer: Self-pay

## 2017-08-30 ENCOUNTER — Ambulatory Visit: Payer: Managed Care, Other (non HMO) | Admitting: Family Medicine

## 2017-08-30 VITALS — BP 118/78 | HR 86 | Temp 98.7°F | Resp 16 | Ht 75.0 in | Wt 194.6 lb

## 2017-08-30 DIAGNOSIS — E1165 Type 2 diabetes mellitus with hyperglycemia: Secondary | ICD-10-CM

## 2017-08-30 DIAGNOSIS — Z23 Encounter for immunization: Secondary | ICD-10-CM | POA: Diagnosis not present

## 2017-08-30 DIAGNOSIS — Z794 Long term (current) use of insulin: Secondary | ICD-10-CM | POA: Diagnosis not present

## 2017-08-30 MED ORDER — INSULIN DETEMIR 100 UNIT/ML FLEXPEN: 22.0000 [IU] | PEN_INJECTOR | Freq: Every day | SUBCUTANEOUS | 2 refills | Status: DC

## 2017-08-30 NOTE — Progress Notes (Signed)
Subjective:  By signing my name below, I, Essence Howell, attest that this documentation has been prepared under the direction and in the presence of [name], MD Electronically Signed: Ladene Artist, ED Scribe 08/30/2017 at 4:58 PM.   Patient ID: Edward Walls, male    DOB: July 15, 1981, 36 y.o.   MRN: [id number]  Chief Complaint  Patient presents with  . Diabetes    6 month follow   HPI Edward Walls is a 36 y.o. male who presents to Primary Care at [facility] for follow-up. Last seen 10/8.   DM Diagnosed in Jan. admitted 9/18-21 with DKA along with AKI that improved with hydration. Restarted on insulin with Levemir 20 units qhs and Humalog 5 units with meals. Blood sugars were still running high at last visit in the 200s but no nausea, vomiting, abdominal pain. Recommended increasing Levemir by 2 units every 2-3 days until fasting below 200. Referred to optho for retinopathyscreening. Planned on Pneumovax and flu vaccines but was unable to receive. Plan for urine microalbumin today.   Pt reports fasting morning glucose of 120s. Reports his blood glucose averages 130-163 with taking 22 units of Humalog every 3 days (20 units, 20 units and then 22 units). Denies cp, sob, light-headedness, dizziness, blood in stools, melena, symptomatic lows.  Lab Results  Component Value Date   HGBA1C 14.5 (H) 07/04/2017   Patient Active Problem List   Diagnosis Date Noted  . Hypokalemia 07/05/2017  . Abnormal weight loss 07/04/2017  . AKI (acute kidney injury) ([place]) 07/03/2017  . Headache 07/03/2017  . Nausea & vomiting 07/03/2017  . Abdominal pain 07/03/2017  . Hyperglycemia 10/20/2016  . DKA (diabetic ketoacidoses) ([place]) 10/20/2016  . Diabetes mellitus type 2 with ketoacidosis, uncontrolled ([place]) 10/20/2016  . Metabolic acidosis 78/67/6720   Past Medical History:  Diagnosis Date  . Allergy   . Diabetes mellitus without complication [facility])    Past Surgical History:  Procedure  Laterality Date  . DENTAL SURGERY     Allergies  Allergen Reactions  . Peanut-Containing Drug Products Other (See Comments)    Throat burning    Prior to Admission medications   Medication Sig Start Date End Date Taking? Authorizing Provider  blood glucose meter kit and supplies KIT Dispense based on patient and insurance preference. Use up to four times daily as directed. (FOR ICD-9 250.00, 250.01). 07/06/17  Yes Gherghe, Vella Redhead, MD  Continuous Blood Gluc Receiver (FREESTYLE LIBRE READER) DEVI 1 Device by Does not apply route 4 (four) times daily. 07/06/17  Yes Gherghe, Vella Redhead, MD  Continuous Blood Gluc Sensor (FREESTYLE LIBRE SENSOR SYSTEM) MISC 1 sensor every 10 days 07/06/17  Yes Gherghe, Vella Redhead, MD  ibuprofen (ADVIL,MOTRIN) 200 MG tablet Take 400 mg by mouth every 6 (six) hours as needed for headache, mild pain or moderate pain.   Yes [provider]  Insulin Detemir (LEVEMIR FLEXPEN) 100 UNIT/ML Pen Inject 20 Units into the skin daily at 10 pm. 07/06/17  Yes Gherghe, Vella Redhead, MD  insulin lispro (HUMALOG KWIKPEN) 100 UNIT/ML KiwkPen Inject 0.05 mLs (5 Units total) into the skin 3 (three) times daily. 07/06/17  Yes Caren Griffins, MD  Insulin Pen Needle 31G X 5 MM MISC 1 Device by Does not apply route QID. 07/06/17  Yes Gherghe, Vella Redhead, MD  lisinopril (PRINIVIL,ZESTRIL) 2.5 MG tablet Take 1 tablet (2.5 mg total) by mouth daily. 10/23/16  Yes Donne Hazel, MD  polyethylene glycol powder [facility]) powder TAKE  6 CAPFULS OF MIRALAX IN A 32 OUNCE GATORADE AND DRINK THE WHOLE BEVERAGE FOLLOWED BY 3 CAPFULS TWICE A DAY FOR THE NEXT WEEK AND FOLLOW UP WITH YOUR PRIMARY CARE PHYSICIAN. 05/05/16  Yes Leo Grosser, MD   Social History   Socioeconomic History  . Marital status: Single    Spouse name: Not on file  . Number of children: Not on file  . Years of education: Not on file  . Highest education level: Not on file  Social Needs  . Financial resource strain: Not on file  .  Food insecurity - worry: Not on file  . Food insecurity - inability: Not on file  . Transportation needs - medical: Not on file  . Transportation needs - non-medical: Not on file  Occupational History  . Not on file  Tobacco Use  . Smoking status: Never Smoker  . Smokeless tobacco: Never Used  Substance and Sexual Activity  . Alcohol use: No    Alcohol/week: 0.0 oz  . Drug use: No  . Sexual activity: Not on file  Other Topics Concern  . Not on file  Social History Narrative  . Not on file   Review of Systems  Constitutional: Negative for diaphoresis, fatigue and unexpected weight change.  Eyes: Negative for visual disturbance.  Respiratory: Negative for cough, chest tightness and shortness of breath.   Cardiovascular: Negative for chest pain, palpitations and leg swelling.  Gastrointestinal: Negative for abdominal pain and blood in stool.  Neurological: Negative for dizziness, light-headedness and headaches.      Objective:   Physical Exam  Constitutional: He is oriented to person, place, and time. He appears well-developed and well-nourished.  HENT:  Head: Normocephalic and atraumatic.  Eyes: EOM are normal. Pupils are equal, round, and reactive to light.  Neck: No JVD present. Carotid bruit is not present.  Cardiovascular: Normal rate, regular rhythm and normal heart sounds.  No murmur heard. Pulmonary/Chest: Effort normal and breath sounds normal. He has no rales.  Musculoskeletal: He exhibits no edema.  Neurological: He is alert and oriented to person, place, and time.  Skin: Skin is warm and dry.  Psychiatric: He has a normal mood and affect.  Vitals reviewed.  Vitals:   08/30/17 1636  BP: 118/78  Pulse: 86  Resp: 16  Temp: 98.7 F (37.1 C)  TempSrc: Oral  SpO2: 100%  Weight: 194 lb 9.6 oz (88.3 kg)  Height: '6\' 3"'  (1.905 m)      Assessment & Plan:    Edward Walls is a 36 y.o. male Type 2 diabetes mellitus with hyperglycemia, with long-term current  use of insulin ([place]) - Plan: Microalbumin, urine, Insulin Detemir (LEVEMIR FLEXPEN) 100 UNIT/ML Pen  - Clarified instructions regarding Levemir with 22 units each day. Has had some improvement in numbers.  -Continue same dose of Humalog with meals.  -Recheck in approximately 6 weeks for A1c and can assess control at that time further based on home readings.  Need for prophylactic vaccination and inoculation against influenza - Plan: CANCELED: Flu Vaccine QUAD 36+ mos IM  Need for Tdap vaccination - Plan: Tdap vaccine greater than or equal to 7yo IM, CANCELED: Tdap vaccine greater than or equal to 7yo IM   Meds ordered this encounter  Medications  . Insulin Detemir (LEVEMIR FLEXPEN) 100 UNIT/ML Pen    Sig: Inject 22 Units daily at 10 pm into the skin.    Dispense:  15 mL    Refill:  2   Patient  Instructions    Schedule ophthalmology appoinment to screen retina for diabetes changes.  levemir 22 units per day. Continue humalog 5 units with meals.  Follow up in next 6 weeks for repeat A1c.    IF you received an x-ray today, you will receive an invoice from [facility]. Please contact [facility]iology at [phone] with questions or concerns regarding your invoice.   IF you received labwork today, you will receive an invoice from [place]. Please contact [facility] at [phone] with questions or concerns regarding your invoice.   Our billing staff will not be able to assist you with questions regarding bills from these companies.  You will be contacted with the lab results as soon as they are available. The fastest way to get your results is to activate your My Chart account. Instructions are located on the last page of this paperwork. If you have not heard from [place] regarding the results in 2 weeks, please contact this office.      I personally performed the services described in this documentation, which was scribed in my presence. The recorded information has been  reviewed and considered for accuracy and completeness, addended by me as needed, and agree with information above.  Signed,   Merri Ray, MD Primary Care at [place].  09/01/17 12:45 PM

## 2017-08-30 NOTE — Patient Instructions (Addendum)
  Schedule ophthalmology appoinment to screen retina for diabetes changes.  levemir 22 units per day. Continue humalog 5 units with meals.  Follow up in next 6 weeks for repeat A1c.    IF you received an x-ray today, you will receive an [facility]. Please contact [facility] at [phone] with questions or concerns regarding your invoice.   IF you received labwork today, you will receive an invoice from [facility]. Please contact [facility] at [phone] with questions or concerns regarding your invoice.   Our billing staff will not be able to assist you with questions regarding bills from these companies.  You will be contacted with the lab results as soon as they are available. The fastest way to get your results is to activate your My Chart account. Instructions are located on the last page of this paperwork. If you have not heard from us regarding the results in 2 weeks, please contact this office.

## 2017-08-31 LAB — MICROALBUMIN, URINE: Microalbumin, Urine: 4.1 ug/mL

## 2017-10-03 ENCOUNTER — Telehealth: Payer: Self-pay

## 2017-10-03 NOTE — Telephone Encounter (Signed)
Tried to do a PA on this patient's Levemir, however, it request a trial and fail of formulary drug.  I called the ins co to see what formulary is recommended.  They referred me to the nurses line at [phone] but there was a VM that was full.  I tried calling several times, with the same outcome.  I will try again tomorrow.

## 2017-10-04 NOTE — Telephone Encounter (Signed)
Tried again to call the nurse's line, however, there was no answer again today.

## 2017-10-05 NOTE — Telephone Encounter (Signed)
Attempted calling again, however, I received the same outcome.

## 2017-10-10 NOTE — Telephone Encounter (Signed)
Called nurse line today and was told that this line is only for medication that was prescribed by their doctors.  She referred me back to insurance company.

## 2017-10-10 NOTE — Telephone Encounter (Signed)
Called back to insurance, the rep did an over-ride that did approve patient's Levemir.  They can only approve a 45 for 30 day supply.  Anything else will have to covered by a coupon.  Patient aware.

## 2018-03-14 ENCOUNTER — Emergency Department (HOSPITAL_COMMUNITY): Payer: Managed Care, Other (non HMO)

## 2018-03-14 ENCOUNTER — Inpatient Hospital Stay (HOSPITAL_COMMUNITY)
Admission: EM | Admit: 2018-03-14 | Discharge: 2018-03-15 | DRG: 638 | Disposition: A | Discharge status: Home / Self Care | Payer: Managed Care, Other (non HMO) | Attending: Internal Medicine | Admitting: Internal Medicine

## 2018-03-14 ENCOUNTER — Encounter (HOSPITAL_COMMUNITY): Payer: Self-pay

## 2018-03-14 ENCOUNTER — Other Ambulatory Visit: Payer: Self-pay

## 2018-03-14 ENCOUNTER — Inpatient Hospital Stay (HOSPITAL_COMMUNITY): Payer: Managed Care, Other (non HMO)

## 2018-03-14 DIAGNOSIS — Z833 Family history of diabetes mellitus: Secondary | ICD-10-CM | POA: Diagnosis not present

## 2018-03-14 DIAGNOSIS — Z9119 Patient's noncompliance with other medical treatment and regimen: Secondary | ICD-10-CM

## 2018-03-14 DIAGNOSIS — R519 Headache, unspecified: Secondary | ICD-10-CM | POA: Diagnosis present

## 2018-03-14 DIAGNOSIS — R51 Headache: Secondary | ICD-10-CM | POA: Diagnosis not present

## 2018-03-14 DIAGNOSIS — E1065 Type 1 diabetes mellitus with hyperglycemia: Secondary | ICD-10-CM | POA: Diagnosis not present

## 2018-03-14 DIAGNOSIS — E860 Dehydration: Secondary | ICD-10-CM | POA: Diagnosis present

## 2018-03-14 DIAGNOSIS — F1010 Alcohol abuse, uncomplicated: Secondary | ICD-10-CM | POA: Diagnosis present

## 2018-03-14 DIAGNOSIS — E1310 Other specified diabetes mellitus with ketoacidosis without coma: Secondary | ICD-10-CM

## 2018-03-14 DIAGNOSIS — E876 Hypokalemia: Secondary | ICD-10-CM | POA: Diagnosis present

## 2018-03-14 DIAGNOSIS — R0600 Dyspnea, unspecified: Secondary | ICD-10-CM

## 2018-03-14 DIAGNOSIS — N179 Acute kidney failure, unspecified: Secondary | ICD-10-CM | POA: Diagnosis present

## 2018-03-14 DIAGNOSIS — E1010 Type 1 diabetes mellitus with ketoacidosis without coma: Secondary | ICD-10-CM

## 2018-03-14 DIAGNOSIS — E1110 Type 2 diabetes mellitus with ketoacidosis without coma: Secondary | ICD-10-CM | POA: Diagnosis present

## 2018-03-14 DIAGNOSIS — Z794 Long term (current) use of insulin: Secondary | ICD-10-CM | POA: Diagnosis not present

## 2018-03-14 DIAGNOSIS — E1165 Type 2 diabetes mellitus with hyperglycemia: Secondary | ICD-10-CM

## 2018-03-14 LAB — BASIC METABOLIC PANEL
ANION GAP: 15 (ref 5–15)
ANION GAP: 11 (ref 5–15)
ANION GAP: 8 (ref 5–15)
ANION GAP: 4 — AB (ref 5–15)
BUN: 11 mg/dL (ref 6–20)
BUN: 10 mg/dL (ref 6–20)
BUN: 10 mg/dL (ref 6–20)
BUN: 10 mg/dL (ref 6–20)
CALCIUM: 7.4 mg/dL — AB (ref 8.9–10.3)
CO2: 8 mmol/L — AB (ref 22–32)
CO2: 14 mmol/L — AB (ref 22–32)
CO2: 18 mmol/L — ABNORMAL LOW (ref 22–32)
CO2: 20 mmol/L — ABNORMAL LOW (ref 22–32)
Calcium: 7.9 mg/dL — ABNORMAL LOW (ref 8.9–10.3)
Calcium: 8.1 mg/dL — ABNORMAL LOW (ref 8.9–10.3)
Calcium: 8 mg/dL — ABNORMAL LOW (ref 8.9–10.3)
Chloride: 117 mmol/L — ABNORMAL HIGH (ref 101–111)
Chloride: 114 mmol/L — ABNORMAL HIGH (ref 101–111)
Chloride: 113 mmol/L — ABNORMAL HIGH (ref 101–111)
Chloride: 115 mmol/L — ABNORMAL HIGH (ref 101–111)
Creatinine, Ser: 1.2 mg/dL (ref 0.61–1.24)
Creatinine, Ser: 1.11 mg/dL (ref 0.61–1.24)
Creatinine, Ser: 0.93 mg/dL (ref 0.61–1.24)
Creatinine, Ser: 0.98 mg/dL (ref 0.61–1.24)
Glucose, Bld: 212 mg/dL — ABNORMAL HIGH (ref 65–99)
Glucose, Bld: 213 mg/dL — ABNORMAL HIGH (ref 65–99)
Glucose, Bld: 158 mg/dL — ABNORMAL HIGH (ref 65–99)
Glucose, Bld: 148 mg/dL — ABNORMAL HIGH (ref 65–99)
Potassium: 4.1 mmol/L (ref 3.5–5.1)
Potassium: 3.6 mmol/L (ref 3.5–5.1)
Potassium: 3.2 mmol/L — ABNORMAL LOW (ref 3.5–5.1)
Potassium: 3.6 mmol/L (ref 3.5–5.1)
SODIUM: 139 mmol/L (ref 135–145)
Sodium: 140 mmol/L (ref 135–145)
Sodium: 139 mmol/L (ref 135–145)
Sodium: 139 mmol/L (ref 135–145)

## 2018-03-14 LAB — GLUCOSE, CAPILLARY
GLUCOSE-CAPILLARY: 218 mg/dL — AB (ref 65–99)
GLUCOSE-CAPILLARY: 168 mg/dL — AB (ref 65–99)
GLUCOSE-CAPILLARY: 123 mg/dL — AB (ref 65–99)
GLUCOSE-CAPILLARY: 126 mg/dL — AB (ref 65–99)
GLUCOSE-CAPILLARY: 130 mg/dL — AB (ref 65–99)
Glucose-Capillary: 120 mg/dL — ABNORMAL HIGH (ref 65–99)
Glucose-Capillary: 157 mg/dL — ABNORMAL HIGH (ref 65–99)
Glucose-Capillary: 164 mg/dL — ABNORMAL HIGH (ref 65–99)
Glucose-Capillary: 165 mg/dL — ABNORMAL HIGH (ref 65–99)
Glucose-Capillary: 197 mg/dL — ABNORMAL HIGH (ref 65–99)
Glucose-Capillary: 198 mg/dL — ABNORMAL HIGH (ref 65–99)
Glucose-Capillary: 219 mg/dL — ABNORMAL HIGH (ref 65–99)
Glucose-Capillary: 224 mg/dL — ABNORMAL HIGH (ref 65–99)
Glucose-Capillary: 227 mg/dL — ABNORMAL HIGH (ref 65–99)

## 2018-03-14 LAB — COMPREHENSIVE METABOLIC PANEL
ALT: 25 U/L (ref 17–63)
ANION GAP: 27 — AB (ref 5–15)
AST: 26 U/L (ref 15–41)
Albumin: 5.2 g/dL — ABNORMAL HIGH (ref 3.5–5.0)
Alkaline Phosphatase: 97 U/L (ref 38–126)
BUN: 15 mg/dL (ref 6–20)
CO2: 7 mmol/L — ABNORMAL LOW (ref 22–32)
Calcium: 8.9 mg/dL (ref 8.9–10.3)
Chloride: 102 mmol/L (ref 101–111)
Creatinine, Ser: 1.38 mg/dL — ABNORMAL HIGH (ref 0.61–1.24)
GFR calc Af Amer: >60 mL/min (ref >60)
Glucose, Bld: 364 mg/dL — ABNORMAL HIGH (ref 65–99)
POTASSIUM: 4.7 mmol/L (ref 3.5–5.1)
Sodium: 136 mmol/L (ref 135–145)
Total Bilirubin: 1.4 mg/dL — ABNORMAL HIGH (ref 0.3–1.2)
Total Protein: 8.9 g/dL — ABNORMAL HIGH (ref 6.5–8.1)

## 2018-03-14 LAB — RAPID URINE DRUG SCREEN, HOSP PERFORMED
Amphetamines: NOT DETECTED
BARBITURATES: NOT DETECTED
Benzodiazepines: NOT DETECTED
COCAINE: NOT DETECTED
Opiates: NOT DETECTED
TETRAHYDROCANNABINOL: NOT DETECTED

## 2018-03-14 LAB — CBC WITH DIFFERENTIAL/PLATELET
BASOS ABS: 0 10*3/uL (ref 0.0–0.1)
Basophils Relative: 0 %
Eosinophils Absolute: 0 10*3/uL (ref 0.0–0.7)
Eosinophils Relative: 0 %
HCT: 54 % — ABNORMAL HIGH (ref 39.0–52.0)
Hemoglobin: 17.1 g/dL — ABNORMAL HIGH (ref 13.0–17.0)
Lymphocytes Relative: 12 %
Lymphs Abs: 1.3 10*3/uL (ref 0.7–4.0)
MCH: 27.9 pg (ref 26.0–34.0)
MCHC: 31.7 g/dL (ref 30.0–36.0)
MCV: 88.2 fL (ref 78.0–100.0)
MONO ABS: 0.6 10*3/uL (ref 0.1–1.0)
Monocytes Relative: 5 %
Neutro Abs: 9.2 10*3/uL — ABNORMAL HIGH (ref 1.7–7.7)
Neutrophils Relative %: 83 %
PLATELETS: 246 10*3/uL (ref 150–400)
RBC: 6.12 MIL/uL — ABNORMAL HIGH (ref 4.22–5.81)
RDW: 13.3 % (ref 11.5–15.5)
WBC: 11 10*3/uL — ABNORMAL HIGH (ref 4.0–10.5)

## 2018-03-14 LAB — URINALYSIS, ROUTINE W REFLEX MICROSCOPIC
BILIRUBIN URINE: NEGATIVE
Bacteria, UA: NONE SEEN
Glucose, UA: >=500 mg/dL — AB
KETONES UR: 80 mg/dL — AB
LEUKOCYTES UA: NEGATIVE
NITRITE: NEGATIVE
PROTEIN: 100 mg/dL — AB
Specific Gravity, Urine: 1.022 (ref 1.005–1.030)
pH: 5 (ref 5.0–8.0)

## 2018-03-14 LAB — LIPASE, BLOOD: LIPASE: 27 U/L (ref 11–51)

## 2018-03-14 LAB — CBG MONITORING, ED
GLUCOSE-CAPILLARY: 356 mg/dL — AB (ref 65–99)
Glucose-Capillary: 337 mg/dL — ABNORMAL HIGH (ref 65–99)

## 2018-03-14 LAB — HEMOGLOBIN A1C
Hgb A1c MFr Bld: 15.9 % — ABNORMAL HIGH (ref 4.8–5.6)
MEAN PLASMA GLUCOSE: 409.63 mg/dL

## 2018-03-14 LAB — BETA-HYDROXYBUTYRIC ACID

## 2018-03-14 LAB — ETHANOL

## 2018-03-14 LAB — MRSA PCR SCREENING: MRSA BY PCR: NEGATIVE

## 2018-03-14 MED ORDER — FOLIC ACID 1 MG PO TABS
1.0000 mg | ORAL_TABLET | Freq: Every day | ORAL | Status: DC
  Administered 2018-03-14 – 2018-03-15 (×2): 1 mg via ORAL
  Filled 2018-03-14 (×2): qty 1

## 2018-03-14 MED ORDER — POTASSIUM CHLORIDE 10 MEQ/100ML IV SOLN
10.0000 meq | INTRAVENOUS | Status: DC
  Administered 2018-03-14: 10 meq via INTRAVENOUS
  Filled 2018-03-14: qty 100

## 2018-03-14 MED ORDER — SODIUM CHLORIDE 0.9 % IV SOLN
INTRAVENOUS | Status: DC
  Administered 2018-03-14: 1.8 [IU]/h via INTRAVENOUS
  Filled 2018-03-14 (×2): qty 1

## 2018-03-14 MED ORDER — LORAZEPAM 1 MG PO TABS: 1.0000 mg | ORAL_TABLET | Freq: Four times a day (QID) | ORAL | Status: DC | PRN

## 2018-03-14 MED ORDER — ONDANSETRON HCL 4 MG/2ML IJ SOLN
4.0000 mg | Freq: Once | INTRAMUSCULAR | Status: AC
  Administered 2018-03-14: 4 mg via INTRAVENOUS
  Filled 2018-03-14: qty 2

## 2018-03-14 MED ORDER — SODIUM CHLORIDE 0.9 % IV SOLN: INTRAVENOUS | Status: AC

## 2018-03-14 MED ORDER — SODIUM CHLORIDE 0.9 % IV BOLUS
1000.0000 mL | Freq: Once | INTRAVENOUS | Status: AC
  Administered 2018-03-14: 1000 mL via INTRAVENOUS

## 2018-03-14 MED ORDER — LORAZEPAM 2 MG/ML IJ SOLN: 1.0000 mg | Freq: Four times a day (QID) | INTRAMUSCULAR | Status: DC | PRN

## 2018-03-14 MED ORDER — MORPHINE SULFATE (PF) 2 MG/ML IV SOLN
2.0000 mg | INTRAVENOUS | Status: DC | PRN
  Administered 2018-03-14: 2 mg via INTRAVENOUS
  Filled 2018-03-14: qty 1

## 2018-03-14 MED ORDER — DEXTROSE-NACL 5-0.45 % IV SOLN: INTRAVENOUS | Status: DC

## 2018-03-14 MED ORDER — THIAMINE HCL 100 MG/ML IJ SOLN: 100.0000 mg | Freq: Every day | INTRAMUSCULAR | Status: DC

## 2018-03-14 MED ORDER — VITAMIN B-1 100 MG PO TABS
100.0000 mg | ORAL_TABLET | Freq: Every day | ORAL | Status: DC
  Administered 2018-03-14 – 2018-03-15 (×2): 100 mg via ORAL
  Filled 2018-03-14 (×2): qty 1

## 2018-03-14 MED ORDER — ADULT MULTIVITAMIN W/MINERALS CH
1.0000 | ORAL_TABLET | Freq: Every day | ORAL | Status: DC
  Administered 2018-03-14 – 2018-03-15 (×2): 1 via ORAL
  Filled 2018-03-14 (×2): qty 1

## 2018-03-14 MED ORDER — SODIUM CHLORIDE 0.9 % IV SOLN
INTRAVENOUS | Status: DC
  Administered 2018-03-14: 2.8 [IU]/h via INTRAVENOUS
  Filled 2018-03-14: qty 1

## 2018-03-14 MED ORDER — ONDANSETRON HCL 4 MG/2ML IJ SOLN: 4.0000 mg | Freq: Four times a day (QID) | INTRAMUSCULAR | Status: DC | PRN

## 2018-03-14 MED ORDER — DEXTROSE-NACL 5-0.45 % IV SOLN
INTRAVENOUS | Status: DC
  Administered 2018-03-14 (×2): via INTRAVENOUS

## 2018-03-14 MED ORDER — SODIUM CHLORIDE 0.9 % IV SOLN: INTRAVENOUS | Status: DC

## 2018-03-14 MED ORDER — ENOXAPARIN SODIUM 40 MG/0.4ML ~~LOC~~ SOLN
40.0000 mg | SUBCUTANEOUS | Status: DC
  Administered 2018-03-14 – 2018-03-15 (×2): 40 mg via SUBCUTANEOUS
  Filled 2018-03-14 (×2): qty 0.4

## 2018-03-14 NOTE — ED Provider Notes (Signed)
[facility] DEPT Provider Note   CSN: [id number] Arrival date & time: 03/14/18  0053     History   Chief Complaint Chief Complaint  Patient presents with  . Headache  . Nausea    HPI Edward Walls is a 37 y.o. male.  Patient reports that he has been sick for the last 2 days.  He reports that his sugars have been running high and he has had generalized weakness.  He has been experiencing a headache with nausea and vomiting.  He has not had any diarrhea or abdominal pain.  He has not noticed any fever.  There is no chest pain, cough, shortness of breath.     Past Medical History:  Diagnosis Date  . Allergy   . Diabetes mellitus without complication [facility])     Patient Active Problem List   Diagnosis Date Noted  . Hypokalemia 07/05/2017  . Abnormal weight loss 07/04/2017  . AKI (acute kidney injury) ([place]) 07/03/2017  . Headache 07/03/2017  . Nausea & vomiting 07/03/2017  . Abdominal pain 07/03/2017  . Hyperglycemia 10/20/2016  . DKA (diabetic ketoacidoses) ([place]) 10/20/2016  . Diabetes mellitus type 2 with ketoacidosis, uncontrolled ([place]) 10/20/2016  . Metabolic acidosis 74/94/4967    Past Surgical History:  Procedure Laterality Date  . DENTAL SURGERY          Home Medications    Prior to Admission medications   Medication Sig Start Date End Date Taking? Authorizing Provider  blood glucose meter kit and supplies KIT Dispense based on patient and insurance preference. Use up to four times daily as directed. (FOR ICD-9 250.00, 250.01). 07/06/17  Yes Gherghe, Vella Redhead, MD  Continuous Blood Gluc Receiver (FREESTYLE LIBRE READER) DEVI 1 Device by Does not apply route 4 (four) times daily. 07/06/17  Yes Gherghe, Vella Redhead, MD  Continuous Blood Gluc Sensor (FREESTYLE LIBRE SENSOR SYSTEM) MISC 1 sensor every 10 days 07/06/17  Yes Gherghe, Vella Redhead, MD  ibuprofen (ADVIL,MOTRIN) 200 MG tablet Take 400 mg by mouth every 6 (six) hours as needed  for headache, mild pain or moderate pain.   Yes [provider]  Insulin Detemir (LEVEMIR FLEXPEN) 100 UNIT/ML Pen Inject 22 Units daily at 10 pm into the skin. 08/30/17  Yes [name], MD  insulin lispro (HUMALOG KWIKPEN) 100 UNIT/ML KiwkPen Inject 0.05 mLs (5 Units total) into the skin 3 (three) times daily. 07/06/17  Yes Caren Griffins, MD  Insulin Pen Needle 31G X 5 MM MISC 1 Device by Does not apply route QID. 07/06/17  Yes Gherghe, Vella Redhead, MD    Family History Family History  Problem Relation Age of Onset  . Diabetes Mellitus II Mother   . Diabetes Mellitus II Father   . Diabetes Mellitus II Brother     Social History Social History   Tobacco Use  . Smoking status: Never Smoker  . Smokeless tobacco: Never Used  Substance Use Topics  . Alcohol use: No    Alcohol/week: 0.0 oz  . Drug use: No     Allergies   Peanut-containing drug products   Review of Systems Review of Systems  Constitutional: Positive for fatigue.  Gastrointestinal: Positive for nausea and vomiting.  Neurological: Positive for headaches.  All other systems reviewed and are negative.    Physical Exam Updated Vital Signs BP 133/75 (BP Location: Left Arm)   Pulse 98   Temp 98.2 F (36.8 C) (Oral)   Resp 19   Ht 6'  3" (1.905 m)   Wt 86.2 kg (190 lb)   SpO2 100%   BMI 23.75 kg/m   Physical Exam  Constitutional: He is oriented to person, place, and time. He appears well-developed and well-nourished. No distress.  HENT:  Head: Normocephalic and atraumatic.  Right Ear: Hearing normal.  Left Ear: Hearing normal.  Nose: Nose normal.  Mouth/Throat: Oropharynx is clear and moist and mucous membranes are normal.  Eyes: Pupils are equal, round, and reactive to light. Conjunctivae and EOM are normal.  Neck: Normal range of motion. Neck supple.  Cardiovascular: Regular rhythm, S1 normal and S2 normal. Exam reveals no gallop and no friction rub.  No murmur  heard. Pulmonary/Chest: Effort normal and breath sounds normal. No respiratory distress. He exhibits no tenderness.  Abdominal: Soft. Normal appearance and bowel sounds are normal. There is no hepatosplenomegaly. There is no tenderness. There is no rebound, no guarding, no tenderness at McBurney's point and negative Murphy's sign. No hernia.  Musculoskeletal: Normal range of motion.  Neurological: He is alert and oriented to person, place, and time. He has normal strength. No cranial nerve deficit or sensory deficit. Coordination normal. GCS eye subscore is 4. GCS verbal subscore is 5. GCS motor subscore is 6.  Skin: Skin is warm, dry and intact. No rash noted. No cyanosis.  Psychiatric: He has a normal mood and affect. His speech is normal and behavior is normal. Thought content normal.  Nursing note and vitals reviewed.    ED Treatments / Results  Labs (all labs ordered are listed, but only abnormal results are displayed) Labs Reviewed  CBC WITH DIFFERENTIAL/PLATELET - Abnormal; Notable for the following components:      Result Value   WBC 11.0 (*)    RBC 6.12 (*)    Hemoglobin 17.1 (*)    HCT 54.0 (*)    Neutro Abs 9.2 (*)    All other components within normal limits  COMPREHENSIVE METABOLIC PANEL - Abnormal; Notable for the following components:   CO2 7 (*)    Glucose, Bld 364 (*)    Creatinine, Ser 1.38 (*)    Total Protein 8.9 (*)    Albumin 5.2 (*)    Total Bilirubin 1.4 (*)    Anion gap 27 (*)    All other components within normal limits  URINALYSIS, ROUTINE W REFLEX MICROSCOPIC - Abnormal; Notable for the following components:   Glucose, UA >=500 (*)    Hgb urine dipstick SMALL (*)    Ketones, ur 80 (*)    Protein, ur 100 (*)    All other components within normal limits  BETA-HYDROXYBUTYRIC ACID - Abnormal; Notable for the following components:   Beta-Hydroxybutyric Acid >8.00 (*)    All other components within normal limits  LIPASE, BLOOD    EKG EKG  Interpretation  Date/Time:  Thursday Mar 14 2018 01:50:50 EDT Ventricular Rate:  115 PR Interval:    QRS Duration: 84 QT Interval:  326 QTC Calculation: 451 R Axis:   91 Text Interpretation:  Sinus tachycardia Biatrial enlargement Borderline right axis deviation Probable left ventricular hypertrophy Anterior Q waves, possibly due to LVH Confirmed by Orpah Greek [id number]) on 03/14/2018 2:11:04 AM   Radiology No results found.  Procedures Procedures (including critical care time)  Medications Ordered in ED Medications  sodium chloride 0.9 % bolus 1,000 mL (1,000 mLs Intravenous New Bag/Given 03/14/18 0330)    Followed by  sodium chloride 0.9 % bolus 1,000 mL (has no administration in time range)  insulin regular (NOVOLIN R,HUMULIN R) 100 Units in sodium chloride 0.9 % 100 mL (1 Units/mL) infusion (has no administration in time range)  potassium chloride 10 mEq in 100 mL IVPB (has no administration in time range)  dextrose 5 %-0.45 % sodium chloride infusion (has no administration in time range)  ondansetron (ZOFRAN) injection 4 mg (4 mg Intravenous Given 03/14/18 0334)     Initial Impression / Assessment and Plan / ED Course  I have reviewed the triage vital signs and the nursing notes.  Pertinent labs & imaging results that were available during my care of the patient were reviewed by me and considered in my medical decision making (see chart for details).     Patient presents to the emergency department for evaluation of headache, nausea, vomiting with generalized weakness.  Patient is an insulin-dependent diabetic.  Blood sugar has been elevated.  Patient found to have a significant anion gap acidosis with elevated ketones consistent with DKA.  Abdominal exam is benign and nontender.  Reviewing his records reveals previous hospitalizations with identical symptomatology, previous DKA has been secondary to insulin noncompliance.  As his abdominal exam is benign, he does not  require imaging other than the unremarkable acute abdominal series performed in the ER.  In addition to the hyperglycemia and anion gap acidosis, blood work reveals mildly elevated creatinine.  Patient administered IV fluid hydration and started on IV insulin to treat his DKA.  Will require hospitalization for further management.  CRITICAL CARE Performed by: Orpah Greek   Total critical care time: 30 minutes  Critical care time was exclusive of separately billable procedures and treating other patients.  Critical care was necessary to treat or prevent imminent or life-threatening deterioration.  Critical care was time spent personally by me on the following activities: development of treatment plan with patient and/or surrogate as well as nursing, discussions with consultants, evaluation of patient's response to treatment, examination of patient, obtaining history from patient or surrogate, ordering and performing treatments and interventions, ordering and review of laboratory studies, ordering and review of radiographic studies, pulse oximetry and re-evaluation of patient's condition.   Final Clinical Impressions(s) / ED Diagnoses   Final diagnoses:  Diabetic ketoacidosis without coma associated with other specified diabetes mellitus [facility])    ED Discharge Orders    None       Orpah Greek, MD 03/14/18 [phone]

## 2018-03-14 NOTE — Progress Notes (Signed)
Inpatient Diabetes Program Recommendations  AACE/ADA: New Consensus Statement on Inpatient Glycemic Control (2015)  Target Ranges:  Prepandial:   less than 140 mg/dL      Peak postprandial:   less than 180 mg/dL (1-2 hours)      Critically ill patients:  140 - 180 mg/dL   Lab Results  Component Value Date   GLUCAP 123 (H) 03/14/2018   HGBA1C 15.9 (H) 03/14/2018    Review of Glycemic Control  Diabetes history: DM1 Outpatient Diabetes medications: Levemir 22 units QHS, Humalog 5 units tidwc Current orders for Inpatient glycemic control: IV insulin  HgbA1C - 15.9% - uncontrolled! CO2 - 14   AG - 11. Not quite ready for transition to SQ insulin - await BMET at 1800.  Inpatient Diabetes Program Recommendations:     When criteria met for transitioning to SQ insulin, give Levemir 20 units 2 hours prior to discontinuation of drip. Novolog 0-9 units Q4H x 12H, then tidwc and hs Novolog 4 units tidwc for meal coverage insulin  Spoke with pt regarding his glycemic control at home and HgbA1C of 15.9%. Pt states he went to pharmacy to refill Levemir and pharmacist called MD for refill. It was never refilled and he went several days with no basal insulin. Discussed with pt that it was his responsibility, not the Pharmacist's, to call his MD about needing his insulin. Pt did not f/u with MD since 08/30/2017, according to EMR. Discussed importance of controlling blood sugars to prevent long-term complications. Pt has headache and didn't want to talk too much. Will need appt with PCP prior to discharge.   Will continue to follow.  Thank you. Lorenda Peck, RD, LDN, CDE Inpatient Diabetes Coordinator [phone]

## 2018-03-14 NOTE — H&P (Addendum)
History and Physical    Edward Walls VQQ:[id number] DOB: 02-12-1981 DOA: 03/14/2018  PCP: [name], MD Patient coming from: Home  Chief Complaint: Nausea/vomiting/dry mouth  HPI: Edward Walls is a 37 y.o. male with medical history significant of insulin dependent diabetes. Patient has a 4 day history of general malaise, dry mouth, nausea and vomiting that progressively worsened. He increased his water intake without much improvement. He last took insulin (Humalog) on 5/28. He last took Levemir 2 months ago because he feels like he does not need to take it. He feels this may have been precipitated by heavy binge drinking on 5/25.  ED Course: Vitals: Afebrile, slight tachycardia in low 100s, normal respirations, normotensive, on room air. Labs: creatinine of 1.38, anion gap of 27, CO2 of 7, total bilirubin of 1.4 Imaging: no significant findings on abdominal x-ray Medications/Course: Insulin drip, potassium, 2L NS  Review of Systems: Review of Systems  Constitutional: Positive for malaise/fatigue. Negative for chills and fever.  Respiratory: Positive for shortness of breath. Negative for cough and wheezing.   Cardiovascular: Positive for palpitations. Negative for chest pain and leg swelling.  Gastrointestinal: Positive for nausea and vomiting. Negative for abdominal pain, constipation and diarrhea.  Neurological: Positive for headaches.  Endo/Heme/Allergies: Positive for polydipsia.  All other systems reviewed and are negative.   Past Medical History:  Diagnosis Date  . Allergy   . Diabetes mellitus without complication [facility])     Past Surgical History:  Procedure Laterality Date  . DENTAL SURGERY       reports that he has never smoked. He has never used smokeless tobacco. He reports that he does not drink alcohol or use drugs.  Allergies  Allergen Reactions  . Peanut-Containing Drug Products Other (See Comments)    Throat burning     Family History    Problem Relation Age of Onset  . Diabetes Mellitus II Mother   . Diabetes Mellitus II Father   . Diabetes Mellitus II Brother     Prior to Admission medications   Medication Sig Start Date End Date Taking? Authorizing Provider  blood glucose meter kit and supplies KIT Dispense based on patient and insurance preference. Use up to four times daily as directed. (FOR ICD-9 250.00, 250.01). 07/06/17  Yes Gherghe, Vella Redhead, MD  Continuous Blood Gluc Receiver (FREESTYLE LIBRE READER) DEVI 1 Device by Does not apply route 4 (four) times daily. 07/06/17  Yes Gherghe, Vella Redhead, MD  Continuous Blood Gluc Sensor (FREESTYLE LIBRE SENSOR SYSTEM) MISC 1 sensor every 10 days 07/06/17  Yes Gherghe, Vella Redhead, MD  ibuprofen (ADVIL,MOTRIN) 200 MG tablet Take 400 mg by mouth every 6 (six) hours as needed for headache, mild pain or moderate pain.   Yes [provider]  Insulin Detemir (LEVEMIR FLEXPEN) 100 UNIT/ML Pen Inject 22 Units daily at 10 pm into the skin. 08/30/17  Yes [name], MD  insulin lispro (HUMALOG KWIKPEN) 100 UNIT/ML KiwkPen Inject 0.05 mLs (5 Units total) into the skin 3 (three) times daily. 07/06/17  Yes Caren Griffins, MD  Insulin Pen Needle 31G X 5 MM MISC 1 Device by Does not apply route QID. 07/06/17  Yes Caren Griffins, MD    Physical Exam: Vitals:   03/14/18 0430 03/14/18 0500 03/14/18 0600 03/14/18 0700  BP: 132/74 128/77 139/72 131/74  Pulse: 97 93 (!) 101 (!) 109  Resp:    19  Temp:      TempSrc:  SpO2: 100% 100% 100% 100%  Weight:      Height:         Constitutional: NAD, calm, comfortable Eyes: PERRL, lids and conjunctivae normal ENMT: Mucous membranes are dry. Posterior pharynx clear of any exudate or lesions.  Neck: normal, supple, no masses, no thyromegaly Respiratory: clear to auscultation bilaterally, no wheezing, no crackles. Normal respiratory effort. No accessory muscle use.  Cardiovascular: Fast rate and regular rhythm, no murmurs / rubs  / gallops. No extremity edema. 2+ pedal pulses. No carotid bruits.  Abdomen: no tenderness, no masses palpated. No hepatosplenomegaly. Bowel sounds positive.  Musculoskeletal: no clubbing / cyanosis. No joint deformity upper and lower extremities. Good ROM, no contractures. Normal muscle tone.  Skin: no rashes, lesions, ulcers. No induration Neurologic: CN 2-12 grossly intact. Sensation intact, DTR normal. Strength 5/5 in all 4.  Psychiatric: Normal judgment and insight. Alert and oriented x 3. Normal mood.   Labs on Admission: I have personally reviewed following labs and imaging studies  CBC: Recent Labs  Lab 03/14/18 0246  WBC 11.0*  NEUTROABS 9.2*  HGB 17.1*  HCT 54.0*  MCV 88.2  PLT 409   Basic Metabolic Panel: Recent Labs  Lab 03/14/18 0246  NA 136  K 4.7  CL 102  CO2 7*  GLUCOSE 364*  BUN 15  CREATININE 1.38*  CALCIUM 8.9   GFR: Estimated Creatinine Clearance: 89.3 mL/min (A) (by C-G formula based on SCr of 1.38 mg/dL (H)). Liver Function Tests: Recent Labs  Lab 03/14/18 0246  AST 26  ALT 25  ALKPHOS 97  BILITOT 1.4*  PROT 8.9*  ALBUMIN 5.2*   Recent Labs  Lab 03/14/18 0246  LIPASE 27   No results for input(s): AMMONIA in the last 168 hours. Coagulation Profile: No results for input(s): INR, PROTIME in the last 168 hours. Cardiac Enzymes: No results for input(s): CKTOTAL, CKMB, CKMBINDEX, TROPONINI in the last 168 hours. BNP (last 3 results) No results for input(s): PROBNP in the last 8760 hours. HbA1C: No results for input(s): HGBA1C in the last 72 hours. CBG: Recent Labs  Lab 03/14/18 0612  GLUCAP 337*   Lipid Profile: No results for input(s): CHOL, HDL, LDLCALC, TRIG, CHOLHDL, LDLDIRECT in the last 72 hours. Thyroid Function Tests: No results for input(s): TSH, T4TOTAL, FREET4, T3FREE, THYROIDAB in the last 72 hours. Anemia Panel: No results for input(s): VITAMINB12, FOLATE, FERRITIN, TIBC, IRON, RETICCTPCT in the last 72 hours. Urine  analysis:    Component Value Date/Time   COLORURINE YELLOW 03/14/2018 [place] 03/14/2018 0213   LABSPEC 1.022 03/14/2018 0213   PHURINE 5.0 03/14/2018 0213   GLUCOSEU >=500 (A) 03/14/2018 0213   HGBUR SMALL (A) 03/14/2018 0213   BILIRUBINUR NEGATIVE 03/14/2018 0213   KETONESUR 80 (A) 03/14/2018 0213   PROTEINUR 100 (A) 03/14/2018 0213   NITRITE NEGATIVE 03/14/2018 0213   LEUKOCYTESUR NEGATIVE 03/14/2018 0213   Radiological Exams on Admission: Dg Abd Acute W/chest  Result Date: 03/14/2018 CLINICAL DATA:  37 y/o M; nausea, vomiting, body aches, headache, fever for 24 hours. EXAM: DG ABDOMEN ACUTE W/ 1V CHEST COMPARISON:  07/03/2017 chest radiograph. FINDINGS: There is no evidence of dilated bowel loops or free intraperitoneal air. No radiopaque calculi or other significant radiographic abnormality is seen. Heart size and mediastinal contours are within normal limits. Both lungs are clear. Mild dextrocurvature of lower thoracic spine. Oblong foreign body projects over left upper quadrant on the abdominal radiograph, probably external to the patient. IMPRESSION: Normal bowel  gas pattern. No acute cardiopulmonary disease. Oblong foreign body projects over left upper quadrant on the abdominal radiograph, probably external to the patient. Electronically Signed   By: Kristine Garbe M.D.   On: 03/14/2018 06:11    EKG: Independently reviewed. Tachycardia, sinus rhythm, enlarged/wide p-waves. Unchanged from previous EKG.  Assessment/Plan Active Problems:   DKA (diabetic ketoacidoses) (HCC)   Headache   Alcohol abuse   DKA Secondary to noncompliance.  Patient significantly acidotic with significant elevated anion gap on admission.  Mental status is good.  Patient's barriers to management of his diabetes include non-adherence. Already given potassium supplementation. -Insulin drip/IV fluids -Given additional 2 L normal saline bolus -Hemoglobin A1c -Diabetes  coordinator -Transition to subcutaneous insulin once gap is closed x2 and acidosis is improved -N.p.o.  Diabetes mellitus, insulin dependent Uncontrolled with hyperglycemia secondary to non-adherence. Last hemoglobin A1C of 14.5% in 06/2017. Unsure if this is truly type 2 versus late onset type 1 vs other. Patient would likely benefit from endocrine outpatient follow-up.   Alcohol abuse Patient generally drinks 2 to 8 glasses of liquor per week in addition to at least 1 can of beer per week.  Last drink was 5 days ago and patient reports binge drinking.  No evidence of alcohol withdrawal. -CIWA  Headache Secondary to dehydration. -morphine prn while n.p.o.   DVT prophylaxis: Lovenox Code Status: Full code Family Communication: Brother at bedside Disposition Plan: Discharge likely in 2 days after resolution of DKA and adequate management of blood sugar Consults called: None Admission status: Inpatient, stepdown   Cordelia Poche, MD [facility] Pager [phone]  If 7PM-7AM, please contact night-coverage [email or web address] Password [email or web address]  03/14/2018, 7:28 AM

## 2018-03-14 NOTE — ED Notes (Signed)
ED TO INPATIENT HANDOFF REPORT  Name/Age/Gender Edward Walls 37 y.o. male  Code Status    Code Status Orders  (From admission, onward)        Start     Ordered   03/14/18 0801  Full code  Continuous     03/14/18 0800    Code Status History    Date Active Date Inactive Code Status Order ID Comments User Context   07/03/2017 1959 07/06/2017 1351 Full Code [id number]  Ivor Costa, MD ED   10/20/2016 1712 10/22/2016 2001 Full Code [id number]  Donne Hazel, MD ED      Home/SNF/Other Home  Chief Complaint shortness of breath; emesis  Level of Care/Admitting Diagnosis ED Disposition    ED Disposition Condition Real Hospital Area: [place] [[id number]]  Level of Care: Stepdown [14]  Admit to SDU based on following criteria: Severe physiological/psychological symptoms:  Any diagnosis requiring assessment & intervention at least every 4 hours on an ongoing basis to obtain desired patient outcomes including stability and rehabilitation  Diagnosis: DKA (diabetic ketoacidoses) [facility]) [[id number]]  Admitting Physician: Mariel Aloe [phone]  Attending Physician: Mariel Aloe [phone]  Estimated length of stay: past midnight tomorrow  Certification:: I certify this patient will need inpatient services for at least 2 midnights  PT Class (Do Not Modify): Inpatient [101]  PT Acc Code (Do Not Modify): Private [1]       Medical History Past Medical History:  Diagnosis Date  . Allergy   . Diabetes mellitus without complication (HCC)     Allergies Allergies  Allergen Reactions  . Peanut-Containing Drug Products Other (See Comments)    Throat burning     IV Location/Drains/Wounds Patient Lines/Drains/Airways Status   Active Line/Drains/Airways    Name:   Placement date:   Placement time:   Site:   Days:   Peripheral IV 03/14/18 Right Forearm   03/14/18    0325    Forearm   less than 1   Peripheral IV 03/14/18 Left Forearm   03/14/18    0626     Forearm   less than 1          Labs/Imaging Results for orders placed or performed during the hospital encounter of 03/14/18 (from the past 48 hour(s))  Urinalysis, Routine w reflex microscopic     Status: Abnormal   Collection Time: 03/14/18  2:13 AM  Result Value Ref Range   Color, Urine YELLOW YELLOW   APPearance CLEAR CLEAR   Specific Gravity, Urine 1.022 1.005 - 1.030   pH 5.0 5.0 - 8.0   Glucose, UA >=500 (A) NEGATIVE mg/dL   Hgb urine dipstick SMALL (A) NEGATIVE   Bilirubin Urine NEGATIVE NEGATIVE   Ketones, ur 80 (A) NEGATIVE mg/dL   Protein, ur 100 (A) NEGATIVE mg/dL   Nitrite NEGATIVE NEGATIVE   Leukocytes, UA NEGATIVE NEGATIVE   RBC / HPF 0-5 0 - 5 RBC/hpf   WBC, UA 0-5 0 - 5 WBC/hpf   Bacteria, UA NONE SEEN NONE SEEN    Comment: Performed at [facility], [place] [address] [place], [place] [address]  CBC with Differential/Platelet     Status: Abnormal   Collection Time: 03/14/18  2:46 AM  Result Value Ref Range   WBC 11.0 (H) 4.0 - 10.5 K/uL   RBC 6.12 (H) 4.22 - 5.81 MIL/uL   Hemoglobin 17.1 (H) 13.0 - 17.0 g/dL   HCT 54.0 (H) 39.0 - 52.0 %  MCV 88.2 78.0 - 100.0 fL   MCH 27.9 26.0 - 34.0 pg   MCHC 31.7 30.0 - 36.0 g/dL   RDW 13.3 11.5 - 15.5 %   Platelets 246 150 - 400 K/uL   Neutrophils Relative % 83 %   Neutro Abs 9.2 (H) 1.7 - 7.7 K/uL   Lymphocytes Relative 12 %   Lymphs Abs 1.3 0.7 - 4.0 K/uL   Monocytes Relative 5 %   Monocytes Absolute 0.6 0.1 - 1.0 K/uL   Eosinophils Relative 0 %   Eosinophils Absolute 0.0 0.0 - 0.7 K/uL   Basophils Relative 0 %   Basophils Absolute 0.0 0.0 - 0.1 K/uL    Comment: Performed at [facility], [place] [address], [place], [place] [address]  Comprehensive metabolic panel     Status: Abnormal   Collection Time: 03/14/18  2:46 AM  Result Value Ref Range   Sodium 136 135 - 145 mmol/L   Potassium 4.7 3.5 - 5.1 mmol/L   Chloride 102 101 - 111 mmol/L   CO2 7 (L) 22 - 32 mmol/L    Glucose, Bld 364 (H) 65 - 99 mg/dL   BUN 15 6 - 20 mg/dL   Creatinine, Ser 1.38 (H) 0.61 - 1.24 mg/dL   Calcium 8.9 8.9 - 10.3 mg/dL   Total Protein 8.9 (H) 6.5 - 8.1 g/dL   Albumin 5.2 (H) 3.5 - 5.0 g/dL   AST 26 15 - 41 U/L   ALT 25 17 - 63 U/L   Alkaline Phosphatase 97 38 - 126 U/L   Total Bilirubin 1.4 (H) 0.3 - 1.2 mg/dL   GFR calc non Af Amer >60 >60 mL/min   GFR calc Af Amer >60 >60 mL/min    Comment: (NOTE) The eGFR has been calculated using the CKD EPI equation. This calculation has not been validated in all clinical situations. eGFR's persistently <60 mL/min signify possible Chronic Kidney Disease.    Anion gap 27 (H) 5 - 15    Comment: Performed at [facility], [place] [address], [place], [place] [address]  Lipase, blood     Status: None   Collection Time: 03/14/18  2:46 AM  Result Value Ref Range   Lipase 27 11 - 51 U/L    Comment: Performed at [facility], [place] [address], [place], [place] [address]  Beta-hydroxybutyric acid     Status: Abnormal   Collection Time: 03/14/18  2:46 AM  Result Value Ref Range   Beta-Hydroxybutyric Acid >8.00 (H) 0.05 - 0.27 mmol/L    Comment: RESULTS CONFIRMED BY MANUAL DILUTION Performed at [facility] [address] [place], [place] [address]   CBG monitoring, ED     Status: Abnormal   Collection Time: 03/14/18  6:12 AM  Result Value Ref Range   Glucose-Capillary 337 (H) 65 - 99 mg/dL  CBG monitoring, ED     Status: Abnormal   Collection Time: 03/14/18  8:13 AM  Result Value Ref Range   Glucose-Capillary 356 (H) 65 - 99 mg/dL   Dg Abd Acute W/chest  Result Date: 03/14/2018 CLINICAL DATA:  37 y/o M; nausea, vomiting, body aches, headache, fever for 24 hours. EXAM: DG ABDOMEN ACUTE W/ 1V CHEST COMPARISON:  07/03/2017 chest radiograph. FINDINGS: There is no evidence of dilated bowel loops or free intraperitoneal air. No radiopaque calculi or other significant radiographic  abnormality is seen. Heart size and mediastinal contours are within normal limits. Both lungs are clear. Mild  dextrocurvature of lower thoracic spine. Oblong foreign body projects over left upper quadrant on the abdominal radiograph, probably external to the patient. IMPRESSION: Normal bowel gas pattern. No acute cardiopulmonary disease. Oblong foreign body projects over left upper quadrant on the abdominal radiograph, probably external to the patient. Electronically Signed   By: Kristine Garbe M.D.   On: 03/14/2018 06:11    Pending Labs Unresulted Labs (From admission, onward)   Start     Ordered   03/14/18 0734  Urine rapid drug screen (hosp performed)  STAT,   R     03/14/18 0733   03/14/18 0734  Ethanol  Once,   R     03/14/18 0733   Signed and Held  Basic metabolic panel  STAT Now then every 4 hours ,   STAT     Signed and Held   Signed and Held  Creatinine, serum  (enoxaparin (LOVENOX)    CrCl >/= 30 ml/min)  Weekly,   R    Comments:  while on enoxaparin therapy    Signed and Held   Signed and Held  Hemoglobin A1c  Once,   R     Signed and Held   Signed and Held  CBC  Tomorrow morning,   R     Signed and Held   Signed and Held  Basic metabolic panel  Tomorrow morning,   R     Signed and Held      Vitals/Pain Today's Vitals   03/14/18 0800 03/14/18 0820 03/14/18 0830 03/14/18 0842  BP: 138/75  130/73   Pulse: (!) 104  99   Resp:      Temp:    98.5 F (36.9 C)  TempSrc:    Oral  SpO2: 100%  100%   Weight:      Height:      PainSc:  0-No pain      Isolation Precautions No active isolations  Medications Medications  sodium chloride 0.9 % bolus 1,000 mL (0 mLs Intravenous Stopped 03/14/18 0622)    Followed by  sodium chloride 0.9 % bolus 1,000 mL (1,000 mLs Intravenous New Bag/Given 03/14/18 0815)  insulin regular (NOVOLIN R,HUMULIN R) 100 Units in sodium chloride 0.9 % 100 mL (1 Units/mL) infusion (5.6 Units/hr Intravenous Rate/Dose Verify 03/14/18 0825)   potassium chloride 10 mEq in 100 mL IVPB (0 mEq Intravenous Stopped 03/14/18 0729)  dextrose 5 %-0.45 % sodium chloride infusion (has no administration in time range)  morphine 2 MG/ML injection 2 mg (has no administration in time range)  sodium chloride 0.9 % bolus 1,000 mL (1,000 mLs Intravenous New Bag/Given 03/14/18 0820)    Followed by  sodium chloride 0.9 % bolus 1,000 mL (has no administration in time range)  ondansetron (ZOFRAN) injection 4 mg (4 mg Intravenous Given 03/14/18 0334)    Mobility walks

## 2018-03-14 NOTE — ED Notes (Signed)
ADMISSION Provider at bedside. 

## 2018-03-14 NOTE — ED Triage Notes (Signed)
Pt complains of a headache and nausea for two days and has vomited once

## 2018-03-14 NOTE — ED Notes (Signed)
INSULIN INFUSION, BMET , ETHANOL PENDING. DUPLICATE X-RAY. CIWA PROTOCOL, 2-3 LITER BOLUS INFUSING, NPO CURRENTLY

## 2018-03-14 NOTE — ED Notes (Signed)
Bed: WLPT2 Expected date:  Expected time:  Means of arrival:  Comments: 

## 2018-03-15 DIAGNOSIS — F1010 Alcohol abuse, uncomplicated: Secondary | ICD-10-CM

## 2018-03-15 DIAGNOSIS — N179 Acute kidney failure, unspecified: Secondary | ICD-10-CM

## 2018-03-15 DIAGNOSIS — E1065 Type 1 diabetes mellitus with hyperglycemia: Secondary | ICD-10-CM

## 2018-03-15 DIAGNOSIS — E1310 Other specified diabetes mellitus with ketoacidosis without coma: Secondary | ICD-10-CM

## 2018-03-15 LAB — CBC
HEMATOCRIT: 40.1 % (ref 39.0–52.0)
HEMOGLOBIN: 13.3 g/dL (ref 13.0–17.0)
MCH: 28.2 pg (ref 26.0–34.0)
MCHC: 33.2 g/dL (ref 30.0–36.0)
MCV: 85 fL (ref 78.0–100.0)
Platelets: 205 10*3/uL (ref 150–400)
RBC: 4.72 MIL/uL (ref 4.22–5.81)
RDW: 13.2 % (ref 11.5–15.5)
WBC: 5.2 10*3/uL (ref 4.0–10.5)

## 2018-03-15 LAB — BASIC METABOLIC PANEL
ANION GAP: 6 (ref 5–15)
BUN: 10 mg/dL (ref 6–20)
CO2: 21 mmol/L — AB (ref 22–32)
Calcium: 8.2 mg/dL — ABNORMAL LOW (ref 8.9–10.3)
Chloride: 110 mmol/L (ref 101–111)
Creatinine, Ser: 0.91 mg/dL (ref 0.61–1.24)
GFR calc non Af Amer: >60 mL/min (ref >60)
GLUCOSE: 174 mg/dL — AB (ref 65–99)
Potassium: 3.3 mmol/L — ABNORMAL LOW (ref 3.5–5.1)
Sodium: 137 mmol/L (ref 135–145)

## 2018-03-15 LAB — GLUCOSE, CAPILLARY
GLUCOSE-CAPILLARY: 154 mg/dL — AB (ref 65–99)
GLUCOSE-CAPILLARY: 178 mg/dL — AB (ref 65–99)
Glucose-Capillary: 186 mg/dL — ABNORMAL HIGH (ref 65–99)
Glucose-Capillary: 134 mg/dL — ABNORMAL HIGH (ref 65–99)
Glucose-Capillary: 295 mg/dL — ABNORMAL HIGH (ref 65–99)

## 2018-03-15 MED ORDER — ACETAMINOPHEN 325 MG PO TABS
650.0000 mg | ORAL_TABLET | Freq: Four times a day (QID) | ORAL | Status: DC | PRN
  Administered 2018-03-15: 650 mg via ORAL
  Filled 2018-03-15: qty 2

## 2018-03-15 MED ORDER — INSULIN ASPART 100 UNIT/ML ~~LOC~~ SOLN: 0.0000 [IU] | SUBCUTANEOUS | Status: DC

## 2018-03-15 MED ORDER — INSULIN ASPART 100 UNIT/ML ~~LOC~~ SOLN
0.0000 [IU] | SUBCUTANEOUS | Status: AC
  Administered 2018-03-15: 1 [IU] via SUBCUTANEOUS
  Administered 2018-03-15: 5 [IU] via SUBCUTANEOUS
  Administered 2018-03-15: 2 [IU] via SUBCUTANEOUS

## 2018-03-15 MED ORDER — INSULIN LISPRO 100 UNIT/ML (KWIKPEN): 5.0000 [IU] | PEN_INJECTOR | Freq: Three times a day (TID) | SUBCUTANEOUS | 0 refills | Status: DC

## 2018-03-15 MED ORDER — INSULIN ASPART 100 UNIT/ML ~~LOC~~ SOLN: 0.0000 [IU] | Freq: Three times a day (TID) | SUBCUTANEOUS | Status: DC

## 2018-03-15 MED ORDER — INSULIN ASPART 100 UNIT/ML ~~LOC~~ SOLN
4.0000 [IU] | Freq: Three times a day (TID) | SUBCUTANEOUS | Status: DC
  Administered 2018-03-15 (×2): 4 [IU] via SUBCUTANEOUS

## 2018-03-15 MED ORDER — POTASSIUM CHLORIDE CRYS ER 20 MEQ PO TBCR
40.0000 meq | EXTENDED_RELEASE_TABLET | Freq: Two times a day (BID) | ORAL | Status: AC
  Administered 2018-03-15: 40 meq via ORAL
  Filled 2018-03-15: qty 2

## 2018-03-15 MED ORDER — INSULIN DETEMIR 100 UNIT/ML ~~LOC~~ SOLN
20.0000 [IU] | Freq: Every day | SUBCUTANEOUS | Status: DC
  Administered 2018-03-15: 20 [IU] via SUBCUTANEOUS
  Filled 2018-03-15 (×2): qty 0.2

## 2018-03-15 MED ORDER — INSULIN DETEMIR 100 UNIT/ML FLEXPEN: 22.0000 [IU] | PEN_INJECTOR | Freq: Every day | SUBCUTANEOUS | 0 refills | Status: DC

## 2018-03-15 NOTE — Discharge Summary (Signed)
Discharge Summary  Edward Walls JSH:[id number] DOB: 06/19/1981  PCP: [name], MD  Admit date: 03/14/2018 Discharge date: 03/15/2018  Time spent: 40 mins  Recommendations for Outpatient Follow-up:  1. PCP   Discharge Diagnoses:  Active Hospital Problems   Diagnosis Date Noted  . Alcohol abuse 03/14/2018  . Diabetes mellitus, insulin dependent (IDDM), uncontrolled ([facility]) 03/14/2018  . Headache 07/03/2017  . DKA (diabetic ketoacidoses) ([place]) 10/20/2016    Resolved Hospital Problems  No resolved problems to display.    Discharge Condition: Stable  Diet recommendation: Mod carb  Vitals:   03/15/18 1200 03/15/18 1203  BP:  (!) 109/59  Pulse:    Resp:  11  Temp: 97.9 F (36.6 C)   SpO2:  100%    History of present illness:  Edward Walls is a 37 y.o. male with medical history significant of insulin dependent diabetes. Patient has a 4 day history of general malaise, dry mouth, nausea and vomiting that progressively worsened. He increased his water intake without much improvement. He last took insulin (Humalog) on 5/28. He last took Levemir 2 months ago because he feels like he does not need to take it. He feels this may have been precipitated by heavy binge drinking on 5/25. In the ED, pt noted to have elevated CBGs, anion gap of 27, CO2 of 7, Cr of 1.38. Pt was started on insulin drip, IVF and admitted for further management.  Today, pt reported feeling much better, denies any further N/V/abdominal pain, fever/chills. Tolerated a regular diet with no complaints. Pt stable for discharge. Pt advised to be compliant with his insulin regimen and avoid alcohol.   Hospital Course:  Active Problems:   DKA (diabetic ketoacidoses) (HCC)   Headache   Alcohol abuse   Diabetes mellitus, insulin dependent (IDDM), uncontrolled (HCC)  DKA, Hx of DM, insulin dependent Improved, anion gap closed, acidosis improved, CBGs better controlled, asymptomatic A1c: 15.9    Secondary to noncompliance S/P Insulin drip/IV fluids Continue on home regimen, advised on compliance with insulin Follow up with PCP with possible referral to an endocrinologist if still uncontrolled despite compliance  AKI/metabolic acidosis Improved Likely due to above, s/p IVF and correction of hyperglycemia  Hypokalemia Likely due to above Replaced  Alcohol abuse Patient generally drinks 2 to 8 glasses of liquor per week in addition to at least 1 can of beer per week.  Last drink was 5 days ago and patient reports binge drinking.  No evidence of alcohol withdrawal Advised to quit     Procedures:  None  Consultations:  None  Discharge Exam: BP (!) 109/59   Pulse 99   Temp 97.9 F (36.6 C) (Oral)   Resp 11   Ht '6\' 3"'  (1.905 m)   Wt 86.2 kg (190 lb)   SpO2 100%   BMI 23.75 kg/m   General: NAD Cardiovascular: S1, S2 present Respiratory: CTAB   Discharge Instructions You were cared for by a hospitalist during your hospital stay. If you have any questions about your discharge medications or the care you received while you were in the hospital after you are discharged, you can call the unit and asked to speak with the hospitalist on call if the hospitalist that took care of you is not available. Once you are discharged, your primary care physician will handle any further medical issues. Please note that NO REFILLS for any discharge medications will be authorized once you are discharged, as it is imperative that you return to  your primary care physician (or establish a relationship with a primary care physician if you do not have one) for your aftercare needs so that they can reassess your need for medications and monitor your lab values.  Discharge Instructions    Diet - low sodium heart healthy   Complete by:  As directed    Increase activity slowly   Complete by:  As directed      Allergies as of 03/15/2018      Reactions   Peanut-containing Drug Products  Other (See Comments)   Throat burning       Medication List    TAKE these medications   blood glucose meter kit and supplies Kit Dispense based on patient and insurance preference. Use up to four times daily as directed. (FOR ICD-9 250.00, 250.01).   FREESTYLE LIBRE READER Devi 1 Device by Does not apply route 4 (four) times daily.   FREESTYLE LIBRE SENSOR SYSTEM Misc 1 sensor every 10 days   ibuprofen 200 MG tablet Commonly known as:  ADVIL,MOTRIN Take 400 mg by mouth every 6 (six) hours as needed for headache, mild pain or moderate pain.   Insulin Detemir 100 UNIT/ML Pen Commonly known as:  LEVEMIR FLEXPEN Inject 22 Units into the skin daily at 10 pm.   insulin lispro 100 UNIT/ML KiwkPen Commonly known as:  HUMALOG KWIKPEN Inject 0.05 mLs (5 Units total) into the skin 3 (three) times daily.   Insulin Pen Needle 31G X 5 MM Misc 1 Device by Does not apply route QID.      Allergies  Allergen Reactions  . Peanut-Containing Drug Products Other (See Comments)    Throat burning    Follow-up Information    [name], MD. Schedule an appointment as soon as possible for a visit in 1 week(s).   Specialties:  Family Medicine, Sports Medicine Contact information: [address] [place] [address] [phone]            The results of significant diagnostics from this hospitalization (including imaging, microbiology, ancillary and laboratory) are listed below for reference.    Significant Diagnostic Studies: Dg Abd Acute W/chest  Result Date: 03/14/2018 CLINICAL DATA:  37 y/o M; nausea, vomiting, body aches, headache, fever for 24 hours. EXAM: DG ABDOMEN ACUTE W/ 1V CHEST COMPARISON:  07/03/2017 chest radiograph. FINDINGS: There is no evidence of dilated bowel loops or free intraperitoneal air. No radiopaque calculi or other significant radiographic abnormality is seen. Heart size and mediastinal contours are within normal limits. Both lungs are clear. Mild  dextrocurvature of lower thoracic spine. Oblong foreign body projects over left upper quadrant on the abdominal radiograph, probably external to the patient. IMPRESSION: Normal bowel gas pattern. No acute cardiopulmonary disease. Oblong foreign body projects over left upper quadrant on the abdominal radiograph, probably external to the patient. Electronically Signed   By: Kristine Garbe M.D.   On: 03/14/2018 06:11    Microbiology: Recent Results (from the past 240 hour(s))  MRSA PCR Screening     Status: None   Collection Time: 03/14/18 10:02 AM  Result Value Ref Range Status   MRSA by PCR NEGATIVE NEGATIVE Final    Comment:        The GeneXpert MRSA Assay (FDA approved for NASAL specimens only), is one component of a comprehensive MRSA colonization surveillance program. It is not intended to diagnose MRSA infection nor to guide or monitor treatment for MRSA infections. Performed at [facility], [place] [address] [place],  [place] [address]      Labs: Basic Metabolic Panel: Recent Labs  Lab 03/14/18 1041 03/14/18 1403 03/14/18 1744 03/14/18 2144 03/15/18 0143  NA 140 139 139 139 137  K 4.1 3.6 3.2* 3.6 3.3*  CL 117* 114* 113* 115* 110  CO2 8* 14* 18* 20* 21*  GLUCOSE 212* 213* 158* 148* 174*  BUN '11 10 10 10 10  ' CREATININE 1.20 1.11 0.93 0.98 0.91  CALCIUM 7.4* 7.9* 8.1* 8.0* 8.2*   Liver Function Tests: Recent Labs  Lab 03/14/18 0246  AST 26  ALT 25  ALKPHOS 97  BILITOT 1.4*  PROT 8.9*  ALBUMIN 5.2*   Recent Labs  Lab 03/14/18 0246  LIPASE 27   No results for input(s): AMMONIA in the last 168 hours. CBC: Recent Labs  Lab 03/14/18 0246 03/15/18 0143  WBC 11.0* 5.2  NEUTROABS 9.2*  --   HGB 17.1* 13.3  HCT 54.0* 40.1  MCV 88.2 85.0  PLT 246 205   Cardiac Enzymes: No results for input(s): CKTOTAL, CKMB, CKMBINDEX, TROPONINI in the last 168 hours. BNP: BNP (last 3 results) No results for input(s): BNP in the last 8760  hours.  ProBNP (last 3 results) No results for input(s): PROBNP in the last 8760 hours.  CBG: Recent Labs  Lab 03/15/18 0057 03/15/18 0255 03/15/18 0330 03/15/18 0820 03/15/18 1221  GLUCAP 154* 178* 186* 134* 295*       Signed:  Alma Friendly, MD [facility] 03/15/2018, 3:07 PM

## 2018-10-14 ENCOUNTER — Inpatient Hospital Stay (HOSPITAL_COMMUNITY)
Admission: EM | Admit: 2018-10-14 | Discharge: 2018-10-17 | DRG: 638 | Disposition: A | Discharge status: Home / Self Care | Payer: Managed Care, Other (non HMO) | Attending: Internal Medicine | Admitting: Internal Medicine

## 2018-10-14 ENCOUNTER — Telehealth: Payer: Self-pay | Admitting: Acute Care

## 2018-10-14 ENCOUNTER — Other Ambulatory Visit: Payer: Self-pay

## 2018-10-14 ENCOUNTER — Inpatient Hospital Stay (HOSPITAL_COMMUNITY): Payer: Managed Care, Other (non HMO)

## 2018-10-14 ENCOUNTER — Encounter (HOSPITAL_COMMUNITY): Payer: Self-pay | Admitting: *Deleted

## 2018-10-14 ENCOUNTER — Emergency Department (HOSPITAL_COMMUNITY): Payer: Managed Care, Other (non HMO)

## 2018-10-14 DIAGNOSIS — E1165 Type 2 diabetes mellitus with hyperglycemia: Secondary | ICD-10-CM | POA: Diagnosis not present

## 2018-10-14 DIAGNOSIS — E878 Other disorders of electrolyte and fluid balance, not elsewhere classified: Secondary | ICD-10-CM | POA: Diagnosis present

## 2018-10-14 DIAGNOSIS — D72829 Elevated white blood cell count, unspecified: Secondary | ICD-10-CM | POA: Diagnosis present

## 2018-10-14 DIAGNOSIS — E861 Hypovolemia: Secondary | ICD-10-CM | POA: Diagnosis present

## 2018-10-14 DIAGNOSIS — Z833 Family history of diabetes mellitus: Secondary | ICD-10-CM | POA: Diagnosis not present

## 2018-10-14 DIAGNOSIS — N179 Acute kidney failure, unspecified: Secondary | ICD-10-CM | POA: Diagnosis present

## 2018-10-14 DIAGNOSIS — Z794 Long term (current) use of insulin: Secondary | ICD-10-CM

## 2018-10-14 DIAGNOSIS — Z9119 Patient's noncompliance with other medical treatment and regimen: Secondary | ICD-10-CM

## 2018-10-14 DIAGNOSIS — E872 Acidosis: Secondary | ICD-10-CM

## 2018-10-14 DIAGNOSIS — F17200 Nicotine dependence, unspecified, uncomplicated: Secondary | ICD-10-CM | POA: Diagnosis present

## 2018-10-14 DIAGNOSIS — E1110 Type 2 diabetes mellitus with ketoacidosis without coma: Secondary | ICD-10-CM

## 2018-10-14 DIAGNOSIS — E860 Dehydration: Secondary | ICD-10-CM | POA: Diagnosis present

## 2018-10-14 DIAGNOSIS — E1010 Type 1 diabetes mellitus with ketoacidosis without coma: Secondary | ICD-10-CM | POA: Diagnosis present

## 2018-10-14 DIAGNOSIS — IMO0001 Reserved for inherently not codable concepts without codable children: Secondary | ICD-10-CM

## 2018-10-14 LAB — BETA-HYDROXYBUTYRIC ACID: Beta-Hydroxybutyric Acid: >8 mmol/L — ABNORMAL HIGH (ref 0.05–0.27)

## 2018-10-14 LAB — BASIC METABOLIC PANEL
Anion gap: 18 — ABNORMAL HIGH (ref 5–15)
BUN: 18 mg/dL (ref 6–20)
BUN: 17 mg/dL (ref 6–20)
CO2: <7 mmol/L — ABNORMAL LOW (ref 22–32)
CO2: 10 mmol/L — ABNORMAL LOW (ref 22–32)
Calcium: 8.8 mg/dL — ABNORMAL LOW (ref 8.9–10.3)
Calcium: 8.7 mg/dL — ABNORMAL LOW (ref 8.9–10.3)
Chloride: 114 mmol/L — ABNORMAL HIGH (ref 98–111)
Chloride: 114 mmol/L — ABNORMAL HIGH (ref 98–111)
Creatinine, Ser: 1.42 mg/dL — ABNORMAL HIGH (ref 0.61–1.24)
Creatinine, Ser: 1.24 mg/dL (ref 0.61–1.24)
GFR calc non Af Amer: >60 mL/min (ref >60)
Glucose, Bld: 346 mg/dL — ABNORMAL HIGH (ref 70–99)
Glucose, Bld: 228 mg/dL — ABNORMAL HIGH (ref 70–99)
Potassium: 4.4 mmol/L (ref 3.5–5.1)
Potassium: 4 mmol/L (ref 3.5–5.1)
SODIUM: 141 mmol/L (ref 135–145)
Sodium: 142 mmol/L (ref 135–145)

## 2018-10-14 LAB — I-STAT CHEM 8, ED
BUN: 23 mg/dL — AB (ref 6–20)
CALCIUM ION: 1.19 mmol/L (ref 1.15–1.40)
Chloride: 109 mmol/L (ref 98–111)
Creatinine, Ser: 1.1 mg/dL (ref 0.61–1.24)
Glucose, Bld: 601 mg/dL (ref 70–99)
HEMATOCRIT: 56 % — AB (ref 39.0–52.0)
HEMOGLOBIN: 19 g/dL — AB (ref 13.0–17.0)
Potassium: 4.7 mmol/L (ref 3.5–5.1)
SODIUM: 136 mmol/L (ref 135–145)
TCO2: 7 mmol/L — AB (ref 22–32)

## 2018-10-14 LAB — RAPID URINE DRUG SCREEN, HOSP PERFORMED
Amphetamines: NOT DETECTED
Barbiturates: NOT DETECTED
Benzodiazepines: NOT DETECTED
Cocaine: NOT DETECTED
Opiates: NOT DETECTED
Tetrahydrocannabinol: NOT DETECTED

## 2018-10-14 LAB — COMPREHENSIVE METABOLIC PANEL
ALK PHOS: 79 U/L (ref 38–126)
ALT: 30 U/L (ref 0–44)
AST: 34 U/L (ref 15–41)
Albumin: 5.1 g/dL — ABNORMAL HIGH (ref 3.5–5.0)
BILIRUBIN TOTAL: 2 mg/dL — AB (ref 0.3–1.2)
BUN: 20 mg/dL (ref 6–20)
CALCIUM: 9 mg/dL (ref 8.9–10.3)
CREATININE: 1.74 mg/dL — AB (ref 0.61–1.24)
Chloride: 102 mmol/L (ref 98–111)
GFR, EST AFRICAN AMERICAN: 58 mL/min — AB (ref >60)
GFR, EST NON AFRICAN AMERICAN: 50 mL/min — AB (ref >60)
Glucose, Bld: 588 mg/dL (ref 70–99)
Potassium: 4.6 mmol/L (ref 3.5–5.1)
Sodium: 136 mmol/L (ref 135–145)
TOTAL PROTEIN: 8.3 g/dL — AB (ref 6.5–8.1)

## 2018-10-14 LAB — GLUCOSE, CAPILLARY
Glucose-Capillary: 426 mg/dL — ABNORMAL HIGH (ref 70–99)
Glucose-Capillary: 284 mg/dL — ABNORMAL HIGH (ref 70–99)
Glucose-Capillary: 358 mg/dL — ABNORMAL HIGH (ref 70–99)
Glucose-Capillary: 247 mg/dL — ABNORMAL HIGH (ref 70–99)
Glucose-Capillary: 250 mg/dL — ABNORMAL HIGH (ref 70–99)
Glucose-Capillary: 275 mg/dL — ABNORMAL HIGH (ref 70–99)
Glucose-Capillary: 180 mg/dL — ABNORMAL HIGH (ref 70–99)
Glucose-Capillary: 226 mg/dL — ABNORMAL HIGH (ref 70–99)
Glucose-Capillary: 172 mg/dL — ABNORMAL HIGH (ref 70–99)
Glucose-Capillary: 153 mg/dL — ABNORMAL HIGH (ref 70–99)

## 2018-10-14 LAB — URINALYSIS, ROUTINE W REFLEX MICROSCOPIC
BILIRUBIN URINE: NEGATIVE
Glucose, UA: >=500 mg/dL — AB
KETONES UR: 80 mg/dL — AB
LEUKOCYTES UA: NEGATIVE
Nitrite: NEGATIVE
PH: 5 (ref 5.0–8.0)
Protein, ur: 100 mg/dL — AB
SPECIFIC GRAVITY, URINE: 1.022 (ref 1.005–1.030)

## 2018-10-14 LAB — CBC
HCT: 54.5 % — ABNORMAL HIGH (ref 39.0–52.0)
HCT: 53.5 % — ABNORMAL HIGH (ref 39.0–52.0)
HEMOGLOBIN: 16.4 g/dL (ref 13.0–17.0)
Hemoglobin: 16 g/dL (ref 13.0–17.0)
MCH: 27.8 pg (ref 26.0–34.0)
MCH: 26.7 pg (ref 26.0–34.0)
MCHC: 30.1 g/dL (ref 30.0–36.0)
MCHC: 29.9 g/dL — ABNORMAL LOW (ref 30.0–36.0)
MCV: 92.4 fL (ref 80.0–100.0)
MCV: 89.3 fL (ref 80.0–100.0)
NRBC: 0 % (ref 0.0–0.2)
PLATELETS: 255 10*3/uL (ref 150–400)
Platelets: 239 10*3/uL (ref 150–400)
RBC: 5.9 MIL/uL — AB (ref 4.22–5.81)
RBC: 5.99 MIL/uL — ABNORMAL HIGH (ref 4.22–5.81)
RDW: 13.7 % (ref 11.5–15.5)
RDW: 13.3 % (ref 11.5–15.5)
WBC: 15.5 10*3/uL — ABNORMAL HIGH (ref 4.0–10.5)
WBC: 16.4 10*3/uL — ABNORMAL HIGH (ref 4.0–10.5)
nRBC: 0 % (ref 0.0–0.2)

## 2018-10-14 LAB — MRSA PCR SCREENING: MRSA BY PCR: NEGATIVE

## 2018-10-14 LAB — I-STAT CG4 LACTIC ACID, ED
Lactic Acid, Venous: 5.41 mmol/L (ref 0.5–1.9)
Lactic Acid, Venous: 3.07 mmol/L (ref 0.5–1.9)

## 2018-10-14 LAB — CBG MONITORING, ED
GLUCOSE-CAPILLARY: 505 mg/dL — AB (ref 70–99)
Glucose-Capillary: 493 mg/dL — ABNORMAL HIGH (ref 70–99)
Glucose-Capillary: >600 mg/dL (ref 70–99)
Glucose-Capillary: 569 mg/dL (ref 70–99)
Glucose-Capillary: 486 mg/dL — ABNORMAL HIGH (ref 70–99)

## 2018-10-14 LAB — PHOSPHORUS: Phosphorus: 2.9 mg/dL (ref 2.5–4.6)

## 2018-10-14 LAB — MAGNESIUM: Magnesium: 2.2 mg/dL (ref 1.7–2.4)

## 2018-10-14 MED ORDER — SODIUM CHLORIDE 0.9 % IV BOLUS
1000.0000 mL | Freq: Once | INTRAVENOUS | Status: AC
  Administered 2018-10-14: 1000 mL via INTRAVENOUS

## 2018-10-14 MED ORDER — POTASSIUM CHLORIDE 10 MEQ/100ML IV SOLN
10.0000 meq | INTRAVENOUS | Status: AC
  Administered 2018-10-14: 10 meq via INTRAVENOUS

## 2018-10-14 MED ORDER — LIVING WELL WITH DIABETES BOOK
Freq: Once | Status: AC
  Administered 2018-10-14: 18:00:00
  Filled 2018-10-14: qty 1

## 2018-10-14 MED ORDER — CHLORHEXIDINE GLUCONATE 0.12 % MT SOLN
15.0000 mL | Freq: Two times a day (BID) | OROMUCOSAL | Status: DC
  Administered 2018-10-14 – 2018-10-17 (×5): 15 mL via OROMUCOSAL
  Filled 2018-10-14 (×4): qty 15

## 2018-10-14 MED ORDER — DEXTROSE-NACL 5-0.45 % IV SOLN
INTRAVENOUS | Status: DC
  Administered 2018-10-14 – 2018-10-15 (×2): via INTRAVENOUS

## 2018-10-14 MED ORDER — HEPARIN SODIUM (PORCINE) 5000 UNIT/ML IJ SOLN
5000.0000 [IU] | Freq: Three times a day (TID) | INTRAMUSCULAR | Status: DC
  Administered 2018-10-14 – 2018-10-17 (×8): 5000 [IU] via SUBCUTANEOUS
  Filled 2018-10-14 (×8): qty 1

## 2018-10-14 MED ORDER — SODIUM CHLORIDE 0.9 % IV SOLN: INTRAVENOUS | Status: DC

## 2018-10-14 MED ORDER — METOCLOPRAMIDE HCL 5 MG/ML IJ SOLN
10.0000 mg | Freq: Once | INTRAMUSCULAR | Status: AC
  Administered 2018-10-14: 10 mg via INTRAVENOUS
  Filled 2018-10-14: qty 2

## 2018-10-14 MED ORDER — SODIUM CHLORIDE 0.9 % IV SOLN: INTRAVENOUS | Status: AC

## 2018-10-14 MED ORDER — INSULIN REGULAR(HUMAN) IN NACL 100-0.9 UT/100ML-% IV SOLN
INTRAVENOUS | Status: DC
  Administered 2018-10-14: 9.5 [IU]/h via INTRAVENOUS
  Administered 2018-10-14: 11 [IU]/h via INTRAVENOUS
  Filled 2018-10-14: qty 100

## 2018-10-14 MED ORDER — LACTATED RINGERS IV SOLN
INTRAVENOUS | Status: DC
  Administered 2018-10-14: 10:00:00 via INTRAVENOUS

## 2018-10-14 MED ORDER — ORAL CARE MOUTH RINSE
15.0000 mL | Freq: Two times a day (BID) | OROMUCOSAL | Status: DC
  Administered 2018-10-15: 15 mL via OROMUCOSAL

## 2018-10-14 MED ORDER — POTASSIUM CHLORIDE 10 MEQ/100ML IV SOLN
10.0000 meq | INTRAVENOUS | Status: AC
  Administered 2018-10-14: 10 meq via INTRAVENOUS
  Filled 2018-10-14 (×2): qty 100

## 2018-10-14 MED ORDER — DEXTROSE-NACL 5-0.45 % IV SOLN: INTRAVENOUS | Status: DC

## 2018-10-14 MED ORDER — INSULIN REGULAR(HUMAN) IN NACL 100-0.9 UT/100ML-% IV SOLN
INTRAVENOUS | Status: DC
  Administered 2018-10-14: 5.4 [IU]/h via INTRAVENOUS
  Filled 2018-10-14: qty 100

## 2018-10-14 NOTE — Telephone Encounter (Signed)
Received below staff message from Anders SimmondsPete Babcock, Edward Walls:  Simonne MartinetBabcock, Edward Walls, Edward Walls  [facility]        Hey guys can you refer this pt to Edward Walls w/  endocrine please.   Time frame 2-4 weeks if able.    Thanks   Edward Walls    Order has been placed for pt to be referred to endocrinology, Edward. [name]. Nothing further needed.

## 2018-10-14 NOTE — ED Notes (Signed)
Patient transported to CT 

## 2018-10-14 NOTE — Progress Notes (Signed)
Inpatient Diabetes Program Recommendations  AACE/ADA: New Consensus Statement on Inpatient Glycemic Control (2015)  Target Ranges:  Prepandial:   less than 140 mg/dL      Peak postprandial:   less than 180 mg/dL (1-2 hours)      Critically ill patients:  140 - 180 mg/dL   Lab Results  Component Value Date   GLUCAP 247 (H) 10/14/2018   HGBA1C 15.9 (H) 03/14/2018    Review of Glycemic Control  Diabetes history: DM1 Outpatient Diabetes medications:  Current orders for Inpatient glycemic control: IV insulin per DKA orders  Last HgbA1C - 15.9% on 03/14/2018 Beta-Hydroxybutyric Acid - > 8.00  Inpatient Diabetes Program Recommendations:     Continue with IV insulin until AG closed and acidosis cleared. Will need basal/bolus therapy for Type 1 DM.  Spoke with pt briefly when admitted to ICU. Pt states his insurance co has continually increased copay of Levemir, now at $180. Has continued to take Humalog 5 units tidwc. Pt states he isn't sure whether he is a Type 1 or Type 2. Has not followed up with PCP since 08/30/2017. Was admitted for DKA 6 months ago slightly over 24H.  Needs much education. Will talk with pt (and hopefully family) regarding his Type 1 DM and importance of taking both basal and bolus insulin and f/u with endo/PCP.  Will follow closely.  Thank you. Ailene Ardshonda Nam Vossler, RD, LDN, CDE Inpatient Diabetes Coordinator [phone]

## 2018-10-14 NOTE — ED Provider Notes (Signed)
[facility] DEPT Provider Note   CSN: [id number] Arrival date & time: 10/14/18  6834     History   Chief Complaint Chief Complaint  Patient presents with  . Hyperglycemia    HPI Edward Walls is a 37 y.o. male with a history of alcohol use disorder, diabetes mellitus type 1 who presents to the emergency department with a chief complaint of generalized weakness.  He reports constant, worsening generalized weakness for the last week with nonbloody, nonbilious emesis. His mother reports he has been confused for the last 2 days.  Reports that he had a severe headache that began yesterday.  He thinks the year is 2012, but correctly states the month is December, knows he is at [place] [facility], and is able to state his first and last name.  Level 5 caveat secondary to altered mental status.  The history is provided by the patient. No language interpreter was used.    Past Medical History:  Diagnosis Date  . Allergy   . Diabetes mellitus without complication [facility])     Patient Active Problem List   Diagnosis Date Noted  . Alcohol abuse 03/14/2018  . Diabetes mellitus, insulin dependent (IDDM), uncontrolled ([place]) 03/14/2018  . Hypokalemia 07/05/2017  . Abnormal weight loss 07/04/2017  . AKI (acute kidney injury) ([place]) 07/03/2017  . Headache 07/03/2017  . Nausea & vomiting 07/03/2017  . Abdominal pain 07/03/2017  . Hyperglycemia 10/20/2016  . DKA (diabetic ketoacidoses) ([place]) 10/20/2016  . Diabetes mellitus type 2 with ketoacidosis, uncontrolled ([place]) 10/20/2016  . Metabolic acidosis 19/62/2297    Past Surgical History:  Procedure Laterality Date  . DENTAL SURGERY          Home Medications    Prior to Admission medications   Medication Sig Start Date End Date Taking? Authorizing Provider  Insulin Detemir (LEVEMIR FLEXPEN) 100 UNIT/ML Pen Inject 22 Units into the skin daily at 10 pm. 03/15/18  Yes Alma Friendly, MD  insulin  lispro (HUMALOG KWIKPEN) 100 UNIT/ML KiwkPen Inject 0.05 mLs (5 Units total) into the skin 3 (three) times daily. 03/15/18  Yes Alma Friendly, MD  blood glucose meter kit and supplies KIT Dispense based on patient and insurance preference. Use up to four times daily as directed. (FOR ICD-9 250.00, 250.01). 07/06/17   Caren Griffins, MD  Continuous Blood Gluc Receiver (FREESTYLE LIBRE READER) DEVI 1 Device by Does not apply route 4 (four) times daily. 07/06/17   Caren Griffins, MD  Continuous Blood Gluc Sensor (FREESTYLE LIBRE SENSOR SYSTEM) MISC 1 sensor every 10 days 07/06/17   Caren Griffins, MD  Insulin Pen Needle 31G X 5 MM MISC 1 Device by Does not apply route QID. 07/06/17   Caren Griffins, MD    Family History Family History  Problem Relation Age of Onset  . Diabetes Mellitus II Mother   . Diabetes Mellitus II Father   . Diabetes Mellitus II Brother     Social History Social History   Tobacco Use  . Smoking status: Current Every Day Smoker  . Smokeless tobacco: Never Used  Substance Use Topics  . Alcohol use: Yes    Alcohol/week: 9.0 standard drinks    Types: 8 Shots of liquor, 1 Cans of beer per week  . Drug use: Not Currently     Allergies   Peanut-containing drug products   Review of Systems Review of Systems  Unable to perform ROS: Mental status change  Gastrointestinal: Positive for vomiting.  Neurological: Positive for weakness and headaches.  Psychiatric/Behavioral: Positive for confusion.     Physical Exam Updated Vital Signs BP (!) 142/85   Pulse 99   Temp 98.4 F (36.9 C) (Oral)   Resp 13   Ht '6\' 3"'  (1.905 m)   Wt 79.4 kg   SpO2 100%   BMI 21.87 kg/m   Physical Exam Vitals signs and nursing note reviewed.  Constitutional:      General: He is in acute distress.     Appearance: He is well-developed. He is ill-appearing and toxic-appearing.  HENT:     Head: Normocephalic.  Eyes:     Conjunctiva/sclera: Conjunctivae normal.    Neck:     Musculoskeletal: Neck supple.  Cardiovascular:     Rate and Rhythm: Regular rhythm. Tachycardia present.     Heart sounds: No murmur. No friction rub. No gallop.   Pulmonary:     Effort: Pulmonary effort is normal.     Comments: Kussmaul breathing. Lungs are clear to auscultation bilaterally. Abdominal:     General: There is no distension.     Palpations: Abdomen is soft. There is no mass.     Tenderness: There is no abdominal tenderness. There is no guarding or rebound.     Hernia: No hernia is present.  Skin:    General: Skin is warm and dry.  Neurological:     Mental Status: He is alert.     Comments: States the year is 2012.  He correctly states the month is December, is aware that he is at [facility], and is able to state his first and last name.  He can correctly identify his mother.  Follows simple commands.  Can intermittently answer questions appropriately, GCS 13.  Moves all 4 extremities.  Psychiatric:        Behavior: Behavior normal.      ED Treatments / Results  Labs (all labs ordered are listed, but only abnormal results are displayed) Labs Reviewed  CBC - Abnormal; Notable for the following components:      Result Value   WBC 15.5 (*)    RBC 5.90 (*)    HCT 54.5 (*)    All other components within normal limits  URINALYSIS, ROUTINE W REFLEX MICROSCOPIC - Abnormal; Notable for the following components:   Color, Urine STRAW (*)    Glucose, UA >=500 (*)    Hgb urine dipstick SMALL (*)    Ketones, ur 80 (*)    Protein, ur 100 (*)    Bacteria, UA RARE (*)    All other components within normal limits  COMPREHENSIVE METABOLIC PANEL - Abnormal; Notable for the following components:   CO2 <7 (*)    Glucose, Bld 588 (*)    Creatinine, Ser 1.74 (*)    Total Protein 8.3 (*)    Albumin 5.1 (*)    Total Bilirubin 2.0 (*)    GFR calc non Af Amer 50 (*)    GFR calc Af Amer 58 (*)    All other components within normal limits  BLOOD GAS, VENOUS -  Abnormal; Notable for the following components:   pH, Ven 6.875 (*)    pCO2, Ven 24.2 (*)    Bicarbonate 4.2 (*)    All other components within normal limits  BASIC METABOLIC PANEL - Abnormal; Notable for the following components:   Chloride 114 (*)    CO2 <7 (*)    Glucose, Bld 346 (*)    Creatinine, Ser 1.42 (*)  Calcium 8.8 (*)    All other components within normal limits  CBC - Abnormal; Notable for the following components:   WBC 16.4 (*)    RBC 5.99 (*)    HCT 53.5 (*)    MCHC 29.9 (*)    All other components within normal limits  BETA-HYDROXYBUTYRIC ACID - Abnormal; Notable for the following components:   Beta-Hydroxybutyric Acid >8.00 (*)    All other components within normal limits  GLUCOSE, CAPILLARY - Abnormal; Notable for the following components:   Glucose-Capillary 426 (*)    All other components within normal limits  GLUCOSE, CAPILLARY - Abnormal; Notable for the following components:   Glucose-Capillary 284 (*)    All other components within normal limits  GLUCOSE, CAPILLARY - Abnormal; Notable for the following components:   Glucose-Capillary 358 (*)    All other components within normal limits  CBG MONITORING, ED - Abnormal; Notable for the following components:   Glucose-Capillary 493 (*)    All other components within normal limits  CBG MONITORING, ED - Abnormal; Notable for the following components:   Glucose-Capillary >600 (*)    All other components within normal limits  I-STAT CHEM 8, ED - Abnormal; Notable for the following components:   BUN 23 (*)    Glucose, Bld 601 (*)    TCO2 7 (*)    Hemoglobin 19.0 (*)    HCT 56.0 (*)    All other components within normal limits  I-STAT CG4 LACTIC ACID, ED - Abnormal; Notable for the following components:   Lactic Acid, Venous 5.41 (*)    All other components within normal limits  I-STAT CG4 LACTIC ACID, ED - Abnormal; Notable for the following components:   Lactic Acid, Venous 3.07 (*)    All other  components within normal limits  CBG MONITORING, ED - Abnormal; Notable for the following components:   Glucose-Capillary 569 (*)    All other components within normal limits  CBG MONITORING, ED - Abnormal; Notable for the following components:   Glucose-Capillary 486 (*)    All other components within normal limits  CBG MONITORING, ED - Abnormal; Notable for the following components:   Glucose-Capillary 505 (*)    All other components within normal limits  MRSA PCR SCREENING  PHOSPHORUS  MAGNESIUM  RAPID URINE DRUG SCREEN, HOSP PERFORMED  HIV ANTIBODY (ROUTINE TESTING W REFLEX)  BASIC METABOLIC PANEL  HEMOGLOBIN A1C    EKG None  Radiology Ct Head Wo Contrast  Result Date: 10/14/2018 CLINICAL DATA:  Confusion with severe headache yesterday. Diabetic ketoacidosis. EXAM: CT HEAD WITHOUT CONTRAST TECHNIQUE: Contiguous axial images were obtained from the base of the skull through the vertex without intravenous contrast. COMPARISON:  07/03/2017 FINDINGS: Brain: There is no evidence of acute infarct, intracranial hemorrhage, mass, midline shift, or extra-axial fluid collection. The ventricles and sulci are normal. Vascular: No hyperdense vessel. Skull: No fracture or focal osseous lesion. Sinuses/Orbits: Opacification of a single anterior left ethmoid air cell. Clear mastoid air cells. Dysconjugate gaze. Other: None. IMPRESSION: Unremarkable CT appearance of the brain. Electronically Signed   By: Logan Bores M.D.   On: 10/14/2018 11:03   Dg Chest Port 1 View  Result Date: 10/14/2018 CLINICAL DATA:  Diabetic ketoacidosis EXAM: PORTABLE CHEST 1 VIEW COMPARISON:  03/14/2018 FINDINGS: The heart size and mediastinal contours are within normal limits. Both lungs are clear. The visualized skeletal structures are unremarkable. IMPRESSION: No active disease. Electronically Signed   By: Kathreen Devoid   On: 10/14/2018  15:45    Procedures .Critical Care Performed by: Joanne Gavel,  PA-C Authorized by: Joanne Gavel, PA-C   Critical care provider statement:    Critical care time (minutes):  60   Critical care time was exclusive of:  Separately billable procedures and treating other patients and teaching time   Critical care was necessary to treat or prevent imminent or life-threatening deterioration of the following conditions:  Metabolic crisis   Critical care was time spent personally by me on the following activities:  Ordering and performing treatments and interventions, ordering and review of laboratory studies, ordering and review of radiographic studies, pulse oximetry, re-evaluation of patient's condition, examination of patient, evaluation of patient's response to treatment, development of treatment plan with patient or surrogate and obtaining history from patient or surrogate   (including critical care time)  Medications Ordered in ED Medications  dextrose 5 %-0.45 % sodium chloride infusion ( Intravenous Not Given 10/14/18 1402)  lactated ringers infusion ( Intravenous Rate/Dose Change 10/14/18 1403)  potassium chloride 10 mEq in 100 mL IVPB (0 mEq Intravenous Hold 10/14/18 1403)  potassium chloride 10 mEq in 100 mL IVPB (0 mEq Intravenous Hold 10/14/18 1403)  0.9 %  sodium chloride infusion ( Intravenous Not Given 10/14/18 1405)  insulin regular, human (MYXREDLIN) 100 units/ 100 mL infusion ( Intravenous Rate/Dose Verify 10/14/18 1641)  heparin injection 5,000 Units (5,000 Units Subcutaneous Given 10/14/18 1432)  living well with diabetes book MISC (has no administration in time range)  sodium chloride 0.9 % bolus 1,000 mL (0 mLs Intravenous Stopped 10/14/18 1059)  metoCLOPramide (REGLAN) injection 10 mg (10 mg Intravenous Given 10/14/18 1002)     Initial Impression / Assessment and Plan / ED Course  I have reviewed the triage vital signs and the nursing notes.  Pertinent labs & imaging results that were available during my care of the patient were  reviewed by me and considered in my medical decision making (see chart for details).  37 year old male with history of alcohol use disorder and diabetes mellitus type 1 presenting with noncompliance with his home insulin for at least 2 months.  He has had worsening generalized weakness and vomiting for the last week with confusion and a severe headache for the last 2 days.  GCS 13.  States the year is 2012, but is otherwise oriented to month, place, and self.  He has Kussmaul respirations and appears toxic.  Patient was seen and independently evaluated by Dr. Dayna Barker, attending physician.  He is afebrile, tachycardic, tachypneic, and moderately hypertensive on arrival.  Clinical Course as of Oct 14 1645  Mon Oct 14, 2018  0848 Initial examination.  Patient is confused with Kussmaul respirations.  Nurses in the room actively [place] a line.  Requested to be notified if she was unable to gain IV access in the next 5 to 10 minutes.  Discussed the patient with Dr. Dayna Barker, attending physician.   [MM]  0930 Notified by many labs that lactate is 5.41.  Glucose is 501 with a potassium of 4.7.  IV potassium has been ordered per DKA order set.   [MM]  1133 Patient recheck.  Still has Kussmaul respirations.  However, patient is now alert and oriented x4.  States that he has not taken any of his home insulin in at least 2 months due to cost.   [MM]    Clinical Course User Index [MM] ,  A, PA-C   Glucose 588.  Unable to calculate anion gap.  Bicarb  is less than 7.  Creatinine 1.74.  Potassium is 4.6.  EKG with sinus tachycardia.  VBG with pH of 6.875.  DKA order set has been placed with insulin, fluids, and potassium chloride.  Heart rate has continued to improve and blood pressure is downtrending.  Patient continues to be afebrile.  Head CT is unremarkable.  Consulted critical care and spoke with Dr. Valeta Harms.  The critical care team will accept the patient for admission. The patient appears  reasonably stabilized for admission considering the current resources, flow, and capabilities available in the ED at this time, and I doubt any other [facility] requiring further screening and/or treatment in the ED prior to admission.   Final Clinical Impressions(s) / ED Diagnoses   Final diagnoses:  Diabetic ketoacidosis without coma associated with type 1 diabetes mellitus ([place])  AKI (acute kidney injury) [facility])    ED Discharge Orders    None       Joanne Gavel, PA-C 10/14/18 1646    Mesner, Corene Cornea, MD 10/15/18 [phone]

## 2018-10-14 NOTE — ED Triage Notes (Signed)
Pt complains of emesis and weakness x 1 week, pt had CBG of 220 yesterday. Pt has hx of type 1 DM, states he has been taking medications as prescribed. Pt states he did not eat yesterday.

## 2018-10-14 NOTE — ED Notes (Signed)
ED TO INPATIENT HANDOFF REPORT  Name/Age/Gender Edward Walls 37 y.o. male  Code Status    Code Status Orders  (From admission, onward)         Start     Ordered   10/14/18 1247  Full code  Continuous     10/14/18 1251        Code Status History    Date Active Date Inactive Code Status Order ID Comments User Context   03/14/2018 0953 03/15/2018 2210 Full Code [id number]  Narda BondsNettey, Ralph A, MD Inpatient   03/14/2018 0800 03/14/2018 0953 Full Code [id number]  Narda BondsNettey, Ralph A, MD ED   07/03/2017 1959 07/06/2017 1351 Full Code [id number]  Lorretta HarpNiu, Xilin, MD ED   10/20/2016 1712 10/22/2016 2001 Full Code [id number]  Jerald Kiefhiu, Stephen K, MD ED      Home/SNF/Other Home  Chief Complaint diabetic pat with   Level of Care/Admitting Diagnosis ED Disposition    ED Disposition Condition Comment   Admit  [facility] [[id number]]  Level of Care: ICU [6]  Diagnosis: DKA (diabetic ketoacidoses) [facility](HCC) [[id number]][[id number]]  Admitting Physician: Josephine IgoICARD, BRADLEY L [[id number]]  Attending Physician: Josephine IgoICARD, BRADLEY L [[id number]]  Estimated length of stay: past midnight tomorrow  Certification:: I certify this patient will need inpatient services for at least 2 midnights  PT Class (Do Not Modify): Inpatient [101]  PT Acc Code (Do Not Modify): Private [1]       Medical History Past Medical History:  Diagnosis Date  . Allergy   . Diabetes mellitus without complication (HCC)     Allergies Allergies  Allergen Reactions  . Peanut-Containing Drug Products Other (See Comments)    Throat burning     IV Location/Drains/Wounds Patient Lines/Drains/Airways Status   Active Line/Drains/Airways    Name:   Placement date:   Placement time:   Site:   Days:   Peripheral IV 10/14/18 Left Forearm   10/14/18    0925    Forearm   less than 1   Peripheral IV 10/14/18 Right Antecubital   10/14/18    0942    Antecubital   less than 1          Labs/Imaging Results for orders placed or performed  during the hospital encounter of 10/14/18 (from the past 48 hour(s))  CBG monitoring, ED     Status: Abnormal   Collection Time: 10/14/18  8:34 AM  Result Value Ref Range   Glucose-Capillary 493 (H) 70 - 99 mg/dL   Comment 1 Notify RN    Comment 2 Document in Chart   CBC     Status: Abnormal   Collection Time: 10/14/18  9:14 AM  Result Value Ref Range   WBC 15.5 (H) 4.0 - 10.5 K/uL   RBC 5.90 (H) 4.22 - 5.81 MIL/uL   Hemoglobin 16.4 13.0 - 17.0 g/dL   HCT 64.354.5 (H) 32.939.0 - 51.852.0 %   MCV 92.4 80.0 - 100.0 fL   MCH 27.8 26.0 - 34.0 pg   MCHC 30.1 30.0 - 36.0 g/dL   RDW 84.113.7 66.011.5 - 63.015.5 %   Platelets 255 150 - 400 K/uL   nRBC 0.0 0.0 - 0.2 %    Comment: Performed at [facility], [address] [place], [place] [address]  Comprehensive metabolic panel     Status: Abnormal   Collection Time: 10/14/18  9:14 AM  Result Value Ref Range   Sodium 136 135 - 145 mmol/L    Comment: REPEATED  TO VERIFY   Potassium 4.6 3.5 - 5.1 mmol/L   Chloride 102 98 - 111 mmol/L    Comment: REPEATED TO VERIFY   CO2 <7 (L) 22 - 32 mmol/L    Comment: REPEATED TO VERIFY   Glucose, Bld 588 (HH) 70 - 99 mg/dL    Comment: CRITICAL RESULT CALLED TO, READ BACK BY AND VERIFIED WITH: ROSSER,M. RN AT 1023 10/14/18 MULLINS,T    BUN 20 6 - 20 mg/dL   Creatinine, Ser 1.61 (H) 0.61 - 1.24 mg/dL   Calcium 9.0 8.9 - 09.6 mg/dL   Total Protein 8.3 (H) 6.5 - 8.1 g/dL   Albumin 5.1 (H) 3.5 - 5.0 g/dL   AST 34 15 - 41 U/L   ALT 30 0 - 44 U/L   Alkaline Phosphatase 79 38 - 126 U/L   Total Bilirubin 2.0 (H) 0.3 - 1.2 mg/dL   GFR calc non Af Amer 50 (L) >60 mL/min   GFR calc Af Amer 58 (L) >60 mL/min   Anion gap NOT CALCULATED 5 - 15    Comment: REPEATED TO VERIFY Performed at [facility], [address], [place], [place] [address]   I-Stat Chem 8, ED     Status: Abnormal   Collection Time: 10/14/18  9:26 AM  Result Value Ref Range   Sodium 136 135 - 145 mmol/L   Potassium 4.7  3.5 - 5.1 mmol/L   Chloride 109 98 - 111 mmol/L   BUN 23 (H) 6 - 20 mg/dL   Creatinine, Ser 9.81 0.61 - 1.24 mg/dL   Glucose, Bld 191 (HH) 70 - 99 mg/dL   Calcium, Ion 4.78 2.95 - 1.40 mmol/L   TCO2 7 (L) 22 - 32 mmol/L   Hemoglobin 19.0 (H) 13.0 - 17.0 g/dL   HCT 62.1 (H) 30.8 - 65.7 %   Comment NOTIFIED PHYSICIAN   I-Stat CG4 Lactic Acid, ED     Status: Abnormal   Collection Time: 10/14/18  9:26 AM  Result Value Ref Range   Lactic Acid, Venous 5.41 (HH) 0.5 - 1.9 mmol/L   Comment NOTIFIED PHYSICIAN   Blood gas, venous     Status: Abnormal (Preliminary result)   Collection Time: 10/14/18  9:45 AM  Result Value Ref Range   FIO2 21.00    Delivery systems ROOM AIR    pH, Ven 6.875 (LL) 7.250 - 7.430    Comment: RBV MIA MCDONALD, PA AT 0955 BY KENNAN DEPUE,RRT,RCP ON 10/14/2018   pCO2, Ven 24.2 (L) 44.0 - 60.0 mmHg   pO2, Ven 43.2 32.0 - 45.0 mmHg   Bicarbonate 4.2 (L) 20.0 - 28.0 mmol/L   Acid-Base Excess PENDING 0.0 - 2.0 mmol/L   O2 Saturation 69.7 %   Patient temperature 98.6    Collection site VEIN    Drawn by RN    Sample type VEIN     Comment: Performed at [facility], [address], [place], [place] [address]  CBG monitoring, ED     Status: Abnormal   Collection Time: 10/14/18  9:58 AM  Result Value Ref Range   Glucose-Capillary >600 (HH) 70 - 99 mg/dL  Urinalysis, Routine w reflex microscopic     Status: Abnormal   Collection Time: 10/14/18 10:16 AM  Result Value Ref Range   Color, Urine STRAW (A) YELLOW   APPearance CLEAR CLEAR   Specific Gravity, Urine 1.022 1.005 - 1.030   pH 5.0 5.0 - 8.0   Glucose, UA >=500 (A) NEGATIVE  mg/dL   Hgb urine dipstick SMALL (A) NEGATIVE   Bilirubin Urine NEGATIVE NEGATIVE   Ketones, ur 80 (A) NEGATIVE mg/dL   Protein, ur 161 (A) NEGATIVE mg/dL   Nitrite NEGATIVE NEGATIVE   Leukocytes, UA NEGATIVE NEGATIVE   RBC / HPF 0-5 0 - 5 RBC/hpf   WBC, UA 0-5 0 - 5 WBC/hpf   Bacteria, UA RARE (A) NONE SEEN    Squamous Epithelial / LPF 0-5 0 - 5   Mucus PRESENT    Hyaline Casts, UA PRESENT     Comment: Performed at [facility], [address] [place], [place] [address]  CBG monitoring, ED     Status: Abnormal   Collection Time: 10/14/18 11:02 AM  Result Value Ref Range   Glucose-Capillary 569 (HH) 70 - 99 mg/dL   Comment 1 Notify RN   CBG monitoring, ED     Status: Abnormal   Collection Time: 10/14/18 12:00 PM  Result Value Ref Range   Glucose-Capillary 486 (H) 70 - 99 mg/dL  CBG monitoring, ED     Status: Abnormal   Collection Time: 10/14/18  1:03 PM  Result Value Ref Range   Glucose-Capillary 505 (HH) 70 - 99 mg/dL   Comment 1 Notify RN   I-Stat CG4 Lactic Acid, ED     Status: Abnormal   Collection Time: 10/14/18  1:18 PM  Result Value Ref Range   Lactic Acid, Venous 3.07 (HH) 0.5 - 1.9 mmol/L   Comment NOTIFIED PHYSICIAN    Ct Head Wo Contrast  Result Date: 10/14/2018 CLINICAL DATA:  Confusion with severe headache yesterday. Diabetic ketoacidosis. EXAM: CT HEAD WITHOUT CONTRAST TECHNIQUE: Contiguous axial images were obtained from the base of the skull through the vertex without intravenous contrast. COMPARISON:  07/03/2017 FINDINGS: Brain: There is no evidence of acute infarct, intracranial hemorrhage, mass, midline shift, or extra-axial fluid collection. The ventricles and sulci are normal. Vascular: No hyperdense vessel. Skull: No fracture or focal osseous lesion. Sinuses/Orbits: Opacification of a single anterior left ethmoid air cell. Clear mastoid air cells. Dysconjugate gaze. Other: None. IMPRESSION: Unremarkable CT appearance of the brain. Electronically Signed   By: Sebastian Ache M.D.   On: 10/14/2018 11:03    Pending Labs Unresulted Labs (From admission, onward)    Start     Ordered   10/14/18 1252  Hemoglobin A1c  Once,   R     10/14/18 1251   10/14/18 1249  Phosphorus  Once,   R     10/14/18 1251   10/14/18 1249  Beta-hydroxybutyric acid  Once,   R      10/14/18 1251   10/14/18 1249  Magnesium  Once,   R     10/14/18 1251   10/14/18 1249  Urine rapid drug screen (hosp performed)not at [facility]  Once,   R     10/14/18 1251   10/14/18 1247  HIV antibody (Routine Testing)  Once,   R     10/14/18 1251   10/14/18 1247  Basic metabolic panel  STAT Now then every 4 hours ,   STAT     10/14/18 1251   10/14/18 1247  CBC  (heparin)  Once,   R    Comments:  Baseline for heparin therapy IF NOT ALREADY DRAWN.  Notify MD if PLT < 100 K.    10/14/18 1251          Vitals/Pain Today's Vitals   10/14/18 1228 10/14/18 1232 10/14/18 1236 10/14/18 1240  BP:    (!) 145/84  Pulse: (!) 112 (!) 108 (!) 106 (!) 110  Resp: 13 (!) 22 (!) 25 20  Temp:      TempSrc:      SpO2: 100% 100% 100% 100%  Weight:      Height:      PainSc:        Isolation Precautions No active isolations  Medications Medications  dextrose 5 %-0.45 % sodium chloride infusion (has no administration in time range)  lactated ringers infusion ( Intravenous New Bag/Given 10/14/18 1013)  potassium chloride 10 mEq in 100 mL IVPB (0 mEq Intravenous Stopped 10/14/18 1130)  potassium chloride 10 mEq in 100 mL IVPB (0 mEq Intravenous Stopped 10/14/18 1253)  0.9 %  sodium chloride infusion (has no administration in time range)  insulin regular, human (MYXREDLIN) 100 units/ 100 mL infusion (has no administration in time range)  heparin injection 5,000 Units (has no administration in time range)  sodium chloride 0.9 % bolus 1,000 mL (0 mLs Intravenous Stopped 10/14/18 1059)  metoCLOPramide (REGLAN) injection 10 mg (10 mg Intravenous Given 10/14/18 1002)    Mobility walks with person assist

## 2018-10-14 NOTE — H&P (Signed)
NAME:  Edward Walls, MRN:  [id number], DOB:  [birth year], LOS: 0 ADMISSION DATE:  10/14/2018, CONSULTATION DATE:  12/30 REFERRING MD:  Dolly Rias , CHIEF COMPLAINT:  DKA   Brief History   37 year old type I diabetic presents to ER 12/30 w/ several day h/o weakness and 1-2d h/o non-bilious non-bloody emesis. On arrival blood glucose was 601, lactic acid 5.41, with Anion gap metabolic acidosis.  Critical care was asked to admit -> History of noncompliance due to cost barriers History of present illness   37 year old male with history of type 1 diabetes.  Typically maintain Levemir and Humalog.  He often does not take his long-acting due to cost barriers.  He also does not measure his blood glucose frequently, and had been giving himself a break from frequency of monitoring since Thanksgiving.  He presents to the emergency room with chief complaint of worsening weakness, this had progressed over 1 to 2-week timeframe in 1 day history of nausea and vomiting.  On presentation he was found to be hyperglycemic, acidotic, and obtunded.  He was administered IV crystalloid, and then initiated on insulin infusion.  Clinically he improved in the emergency room, as his venous pH was 6.8 the pulmonary team was asked to admit.  Past Medical History  Type 1 diabetes  Significant Hospital Events   12/30 admitted with DKA  Consults:  Diabetic coordinator consulted 12/30  Procedures:    Significant Diagnostic Tests:  Urine drug screen 12/30  Micro Data:   Antimicrobials:    Interim history/subjective:  Feeling better  Objective   Blood pressure (Abnormal) 145/84, pulse (Abnormal) 110, temperature 97.6 F (36.4 C), temperature source Oral, resp. rate 20, height '6\' 3"'  (1.905 m), weight 79.4 kg, SpO2 100 %.        Intake/Output Summary (Last 24 hours) at 10/14/2018 1301 Last data filed at 10/14/2018 1253 Gross per 24 hour  Intake 1200 ml  Output no documentation  Net 1200 ml   Filed Weights    10/14/18 0942  Weight: 79.4 kg    Examination: General: Well-developed 37 year old male patient lying in bed he is in no acute distress HENT: 37 year old male mucous membranes are dry lips are cracked neck veins flat Lungs: Clear to auscultation Cardiovascular: Regular rate and rhythm Abdomen: Soft nontender Extremities: Warm and dry Neuro:  alert oriented no focal deficits GU: Voids  Resolved Hospital Problem list     Assessment & Plan:   Anion gap metabolic acidosis, with mix of what is likely diabetic ketoacidosis and lactic acidosis in the setting of dehydration and organ hypoperfusion.  This is not sepsis. -He has received 2 L of crystalloid in the emergency room with improvement of clinical exam Plan N.p.o. for now  continuing aggressive IV hydration, maintenance IV fluids to change to lactated Ringer's Repeat lactic acid Checking beta hydroxy butyric acid Insulin infusion with every hour Accu-Cheks Serial chemistry  Poorly controlled diabetes type 1 admitted now with working diagnosis of DKA Plan Checking hemoglobin A1c IV insulin for now Diabetic coordinator consult Have contacted our office to make referral to live our endocrinology  AKI in the setting of dehydration Plan Continue IV hydration Renal dose medications  Hemoconcentration in the setting of dehydration Plan Serial hemoglobins Trend CBC    Best practice:  Diet: N.p.o. Pain/Anxiety/Delirium protocol (if indicated): Not applicable VAP protocol (if indicated): We will not applicable DVT prophylaxis: Subcu heparin GI prophylaxis: Not applicable Glucose control: IV insulin Mobility: Bedrest Code Status: Full code Family  Communication: Admit to ICU Disposition: Critically ill due to profound metabolic acidosis, and diabetic ketoacidosis.  Started on insulin drip, aggressively hydrating, will need close observation in the intensive care.  Labs   CBC: Recent Labs  Lab 10/14/18 0914  10/14/18 0926  WBC 15.5*  --   HGB 16.4 19.0*  HCT 54.5* 56.0*  MCV 92.4  --   PLT 255  --     Basic Metabolic Panel: Recent Labs  Lab 10/14/18 0914 10/14/18 0926  NA 136 136  K 4.6 4.7  CL 102 109  CO2 <7*  --   GLUCOSE 588* 601*  BUN 20 23*  CREATININE 1.74* 1.10  CALCIUM 9.0  --    GFR: Estimated Creatinine Clearance: 105.3 mL/min (by C-G formula based on SCr of 1.1 mg/dL). Recent Labs  Lab 10/14/18 0914 10/14/18 0926  WBC 15.5*  --   LATICACIDVEN  --  5.41*    Liver Function Tests: Recent Labs  Lab 10/14/18 0914  AST 34  ALT 30  ALKPHOS 79  BILITOT 2.0*  PROT 8.3*  ALBUMIN 5.1*   No results for input(s): LIPASE, AMYLASE in the last 168 hours. No results for input(s): AMMONIA in the last 168 hours.  ABG    Component Value Date/Time   HCO3 4.2 (L) 10/14/2018 0945   TCO2 7 (L) 10/14/2018 0926   ACIDBASEDEF 10.8 (H) 10/20/2016 1500   O2SAT 69.7 10/14/2018 0945     Coagulation Profile: No results for input(s): INR, PROTIME in the last 168 hours.  Cardiac Enzymes: No results for input(s): CKTOTAL, CKMB, CKMBINDEX, TROPONINI in the last 168 hours.  HbA1C: Hgb A1c MFr Bld  Date/Time Value Ref Range Status  03/14/2018 10:41 AM 15.9 (H) 4.8 - 5.6 % Final    Comment:    (NOTE) Pre diabetes:          5.7%-6.4% Diabetes:              >6.4% Glycemic control for   <7.0% adults with diabetes   07/04/2017 01:22 AM 14.5 (H) 4.8 - 5.6 % Final    Comment:    (NOTE) Pre diabetes:          5.7%-6.4% Diabetes:              >6.4% Glycemic control for   <7.0% adults with diabetes     CBG: Recent Labs  Lab 10/14/18 0834 10/14/18 0958 10/14/18 1102 10/14/18 1200  GLUCAP 493* >600* 569* 486*    Review of Systems:   Review of Systems  Constitutional: Positive for malaise/fatigue. Negative for chills, fever and weight loss.  HENT: Negative for congestion, ear discharge, ear pain, hearing loss, nosebleeds, sinus pain and tinnitus.   Eyes:  Negative.   Respiratory: Negative.   Cardiovascular: Negative for chest pain and palpitations.  Gastrointestinal: Negative for nausea and vomiting.  Genitourinary: Negative.   Musculoskeletal: Negative.   Skin: Negative.   Neurological: Negative.   Endo/Heme/Allergies: Negative.     Past Medical History  He,  has a past medical history of Allergy and Diabetes mellitus without complication ([place]).   Surgical History    Past Surgical History:  Procedure Laterality Date  . DENTAL SURGERY       Social History   reports that he has been smoking. He has never used smokeless tobacco. He reports current alcohol use of about 9.0 standard drinks of alcohol per week. He reports previous drug use.   Family History   His family history includes Diabetes  Mellitus II in his brother, father, and mother.   Allergies Allergies  Allergen Reactions  . Peanut-Containing Drug Products Other (See Comments)    Throat burning      Home Medications  Prior to Admission medications   Medication Sig Start Date End Date Taking? Authorizing Provider  Insulin Detemir (LEVEMIR FLEXPEN) 100 UNIT/ML Pen Inject 22 Units into the skin daily at 10 pm. 03/15/18  Yes Alma Friendly, MD  insulin lispro (HUMALOG KWIKPEN) 100 UNIT/ML KiwkPen Inject 0.05 mLs (5 Units total) into the skin 3 (three) times daily. 03/15/18  Yes Alma Friendly, MD  blood glucose meter kit and supplies KIT Dispense based on patient and insurance preference. Use up to four times daily as directed. (FOR ICD-9 250.00, 250.01). 07/06/17   Caren Griffins, MD  Continuous Blood Gluc Receiver (FREESTYLE LIBRE READER) DEVI 1 Device by Does not apply route 4 (four) times daily. 07/06/17   Caren Griffins, MD  Continuous Blood Gluc Sensor (FREESTYLE LIBRE SENSOR SYSTEM) MISC 1 sensor every 10 days 07/06/17   Caren Griffins, MD  Insulin Pen Needle 31G X 5 MM MISC 1 Device by Does not apply route QID. 07/06/17   Caren Griffins, MD      Critical care time: 35 minutes       Erick Colace ACNP-BC [place] Pager # [phone] OR # [phone] if no answer

## 2018-10-14 NOTE — ED Provider Notes (Signed)
Medical screening examination/treatment/procedure(s) were conducted as a shared visit with non-physician practitioner(s) and myself.  I personally evaluated the patient during the encounter.  Known diabetic who apparently is not been take his insulin has been feeling bad for couple days.  On my examination patient has insulin drip started.  His mental status is diminished but will awaken answer questions.  He still having to small breathing and is mildly tachycardic but appears that the tachycardia has been improving.  His labs show that his potassium was 4.7 so potassium will be added into his fluids from here on.  His pH came back at 6.8.  Once again he is protecting his airway and seems to be turning around and getting better rather than getting worse however I would suggest discussing with critical care for admission.  He had a severe headache yesterday and with his level of altered mental status and confusion now with a persistent headache would suggest CT scan to evaluate for any evidence of cerebral edema.   None     Reshad Saab, Barbara CowerJason, MD 10/15/18 [phone]

## 2018-10-14 NOTE — ED Notes (Signed)
Date and time results received: 10/14/18 10:26 AM   Test: glucose Critical Value: 588  Name of Provider Notified: Mesner MD  Orders Received? Or Actions Taken?: acknowledges result

## 2018-10-15 LAB — GLUCOSE, CAPILLARY
GLUCOSE-CAPILLARY: 135 mg/dL — AB (ref 70–99)
GLUCOSE-CAPILLARY: 411 mg/dL — AB (ref 70–99)
Glucose-Capillary: 114 mg/dL — ABNORMAL HIGH (ref 70–99)
Glucose-Capillary: 131 mg/dL — ABNORMAL HIGH (ref 70–99)
Glucose-Capillary: 135 mg/dL — ABNORMAL HIGH (ref 70–99)
Glucose-Capillary: 163 mg/dL — ABNORMAL HIGH (ref 70–99)
Glucose-Capillary: 150 mg/dL — ABNORMAL HIGH (ref 70–99)
Glucose-Capillary: 180 mg/dL — ABNORMAL HIGH (ref 70–99)
Glucose-Capillary: 158 mg/dL — ABNORMAL HIGH (ref 70–99)
Glucose-Capillary: 171 mg/dL — ABNORMAL HIGH (ref 70–99)
Glucose-Capillary: 155 mg/dL — ABNORMAL HIGH (ref 70–99)
Glucose-Capillary: 187 mg/dL — ABNORMAL HIGH (ref 70–99)
Glucose-Capillary: 186 mg/dL — ABNORMAL HIGH (ref 70–99)
Glucose-Capillary: 243 mg/dL — ABNORMAL HIGH (ref 70–99)

## 2018-10-15 LAB — BASIC METABOLIC PANEL
Anion gap: 11 (ref 5–15)
BUN: 20 mg/dL (ref 6–20)
CO2: 17 mmol/L — ABNORMAL LOW (ref 22–32)
Calcium: 8.7 mg/dL — ABNORMAL LOW (ref 8.9–10.3)
Chloride: 113 mmol/L — ABNORMAL HIGH (ref 98–111)
Creatinine, Ser: 1.01 mg/dL (ref 0.61–1.24)
GFR calc Af Amer: >60 mL/min (ref >60)
GFR calc non Af Amer: >60 mL/min (ref >60)
GLUCOSE: 185 mg/dL — AB (ref 70–99)
Potassium: 3.6 mmol/L (ref 3.5–5.1)
Sodium: 141 mmol/L (ref 135–145)

## 2018-10-15 LAB — HEMOGLOBIN A1C

## 2018-10-15 LAB — HIV ANTIBODY (ROUTINE TESTING W REFLEX): HIV SCREEN 4TH GENERATION: NONREACTIVE

## 2018-10-15 LAB — GLUCOSE, RANDOM: Glucose, Bld: 446 mg/dL — ABNORMAL HIGH (ref 70–99)

## 2018-10-15 MED ORDER — ONDANSETRON HCL 4 MG/2ML IJ SOLN
INTRAMUSCULAR | Status: AC
  Administered 2018-10-15: 4 mg
  Filled 2018-10-15: qty 2

## 2018-10-15 MED ORDER — ACETAMINOPHEN 325 MG PO TABS
650.0000 mg | ORAL_TABLET | Freq: Four times a day (QID) | ORAL | Status: DC | PRN
  Administered 2018-10-15 – 2018-10-16 (×3): 650 mg via ORAL
  Filled 2018-10-15 (×3): qty 2

## 2018-10-15 MED ORDER — INSULIN ASPART 100 UNIT/ML ~~LOC~~ SOLN
0.0000 [IU] | Freq: Three times a day (TID) | SUBCUTANEOUS | Status: DC
  Administered 2018-10-15: 9 [IU] via SUBCUTANEOUS

## 2018-10-15 MED ORDER — INSULIN ASPART 100 UNIT/ML ~~LOC~~ SOLN
0.0000 [IU] | Freq: Three times a day (TID) | SUBCUTANEOUS | Status: DC
  Administered 2018-10-15: 4 [IU] via SUBCUTANEOUS
  Administered 2018-10-16: 11 [IU] via SUBCUTANEOUS
  Administered 2018-10-16 (×2): 15 [IU] via SUBCUTANEOUS
  Administered 2018-10-17: 11 [IU] via SUBCUTANEOUS
  Administered 2018-10-17: 7 [IU] via SUBCUTANEOUS

## 2018-10-15 MED ORDER — INSULIN ASPART 100 UNIT/ML ~~LOC~~ SOLN
22.0000 [IU] | Freq: Once | SUBCUTANEOUS | Status: AC
  Administered 2018-10-15: 22 [IU] via SUBCUTANEOUS

## 2018-10-15 MED ORDER — INSULIN ASPART 100 UNIT/ML ~~LOC~~ SOLN
7.0000 [IU] | Freq: Three times a day (TID) | SUBCUTANEOUS | Status: DC
  Administered 2018-10-16 – 2018-10-17 (×5): 7 [IU] via SUBCUTANEOUS

## 2018-10-15 MED ORDER — SODIUM CHLORIDE 0.9 % IV SOLN
INTRAVENOUS | Status: DC
  Administered 2018-10-15 – 2018-10-16 (×2): via INTRAVENOUS

## 2018-10-15 MED ORDER — INSULIN ASPART 100 UNIT/ML ~~LOC~~ SOLN
6.0000 [IU] | Freq: Three times a day (TID) | SUBCUTANEOUS | Status: DC
  Administered 2018-10-15 (×2): 6 [IU] via SUBCUTANEOUS

## 2018-10-15 MED ORDER — INSULIN DETEMIR 100 UNIT/ML ~~LOC~~ SOLN
21.0000 [IU] | Freq: Every day | SUBCUTANEOUS | Status: DC
  Administered 2018-10-16: 21 [IU] via SUBCUTANEOUS
  Filled 2018-10-15: qty 0.21

## 2018-10-15 MED ORDER — ENSURE MAX PROTEIN PO LIQD
11.0000 [oz_av] | Freq: Two times a day (BID) | ORAL | Status: DC
  Administered 2018-10-15 – 2018-10-17 (×4): 11 [oz_av] via ORAL
  Filled 2018-10-15 (×6): qty 330

## 2018-10-15 MED ORDER — ONDANSETRON HCL 4 MG/2ML IJ SOLN: 4.0000 mg | Freq: Four times a day (QID) | INTRAMUSCULAR | Status: DC | PRN

## 2018-10-15 MED ORDER — INSULIN DETEMIR 100 UNIT/ML ~~LOC~~ SOLN
12.0000 [IU] | Freq: Every day | SUBCUTANEOUS | Status: DC
  Administered 2018-10-15: 12 [IU] via SUBCUTANEOUS
  Filled 2018-10-15: qty 0.12

## 2018-10-15 MED ORDER — INSULIN ASPART 100 UNIT/ML ~~LOC~~ SOLN
0.0000 [IU] | Freq: Every day | SUBCUTANEOUS | Status: DC
  Administered 2018-10-15 – 2018-10-16 (×2): 2 [IU] via SUBCUTANEOUS

## 2018-10-15 NOTE — Care Management Note (Signed)
Case Management Note  Patient Details  Name: Amalia HaileyJoshua A Martha MRN: [id number] Date of Birth: 05/14/1981  Subjective/Objective:                  Discharge planning  Action/Plan: Follow up provider patient has [employer] insurance instructions given on dc instruction for patient to call customer service on the back of his card to obtain in network pcp and endocrinologist.  Expected Discharge Date:                  Expected Discharge Plan:     In-House Referral:     Discharge planning Services     Post Acute Care Choice:    Choice offered to:     DME Arranged:    DME Agency:     HH Arranged:    HH Agency:     Status of Service:     If discussed at [employer] Meetings, dates discussed:    Additional Comments:  Golda AcreDavis, Rhonda Lynn, RN 10/15/2018, 10:22 AM

## 2018-10-15 NOTE — Progress Notes (Signed)
Patient ID: Edward Walls, male   DOB: [birth year], 37 y.o.   MRN: [id number]  PROGRESS NOTE    Edward Walls  WUJ:[id number] DOB: 1981/02/26 DOA: 10/14/2018 PCP: Shade Flood, MD   Brief Narrative:  37 year old male with history of diabetes mellitus type 1 on insulin at home but has not been able to take his Levemir recently because of cost issues presented on 10/14/2018 with nausea and vomiting.  He was found to be hyperglycemic, acidotic and obtunded and was found to be in DKA.  He was started on insulin drip and was admitted by critical care team.  He has been transferred under the care of [facility] from 10/15/2018 onwards.   Assessment & Plan:   Active Problems:   DKA (diabetic ketoacidoses) (HCC)   DKA in a patient with diabetes mellitus type 1 -Patient was not able to take his long-acting Levemir recently because of cost issues -Treated with insulin drip and IV fluids.  Anion gap has closed and the most recent BMP.  Currently not vomiting.  Will switch to long-acting subcutaneous insulin and subsequently discontinue insulin drip.  Continue Accu-Cheks with coverage.  Diabetes coordinator following.  Will get care management involved for help with insulin as an outpatient. -Start carb modified diet if tolerated  Acute kidney injury -Most likely secondary to above.  Improving.  Leukocytosis -Mostly reactive.  Repeat a.m. labs   DVT prophylaxis: Heparin subcutaneous Code Status: Full Family Communication: None at bedside Disposition Plan: Home by tomorrow if symptoms improve and tolerates diet  Consultants: PCCM  Procedures: None  Antimicrobials: None   Subjective: Patient seen and examined at bedside.  He denies any current nausea or vomiting.  He wants to try some diet.  No overnight fever or vomiting.  Denies any current abdominal pain.  Objective: Vitals:   10/15/18 0700 10/15/18 0759 10/15/18 0800 10/15/18 0900  BP: 126/73  129/74 134/81  Pulse: 72 91 83 77    Resp: 12 13 12 16   Temp:   98.9 F (37.2 C)   TempSrc:   Oral   SpO2: 91% 97% 97% 98%  Weight:      Height:        Intake/Output Summary (Last 24 hours) at 10/15/2018 0949 Last data filed at 10/15/2018 0943 Gross per 24 hour  Intake 2655.41 ml  Output 1350 ml  Net 1305.41 ml   Filed Weights   10/14/18 0942  Weight: 79.4 kg    Examination:  General exam: Appears calm and comfortable.  No distress Respiratory system: Bilateral decreased breath sounds at bases Cardiovascular system: S1 & S2 heard, Rate controlled Gastrointestinal system: Abdomen is nondistended, soft and nontender. Normal bowel sounds heard. Extremities: No cyanosis, clubbing, edema     Data Reviewed: I have personally reviewed following labs and imaging studies  CBC: Recent Labs  Lab 10/14/18 0914 10/14/18 0926 10/14/18 1437  WBC 15.5*  --  16.4*  HGB 16.4 19.0* 16.0  HCT 54.5* 56.0* 53.5*  MCV 92.4  --  89.3  PLT 255  --  239   Basic Metabolic Panel: Recent Labs  Lab 10/14/18 0914 10/14/18 0926 10/14/18 1437 10/14/18 1916 10/15/18 0507  NA 136 136 141 142 141  K 4.6 4.7 4.4 4.0 3.6  CL 102 109 114* 114* 113*  CO2 <7*  --  <7* 10* 17*  GLUCOSE 588* 601* 346* 228* 185*  BUN 20 23* 18 17 20   CREATININE 1.74* 1.10 1.42* 1.24 1.01  CALCIUM 9.0  --  8.8* 8.7* 8.7*  MG  --   --  2.2  --   --   PHOS  --   --  2.9  --   --    GFR: Estimated Creatinine Clearance: 114.6 mL/min (by C-G formula based on SCr of 1.01 mg/dL). Liver Function Tests: Recent Labs  Lab 10/14/18 0914  AST 34  ALT 30  ALKPHOS 79  BILITOT 2.0*  PROT 8.3*  ALBUMIN 5.1*   No results for input(s): LIPASE, AMYLASE in the last 168 hours. No results for input(s): AMMONIA in the last 168 hours. Coagulation Profile: No results for input(s): INR, PROTIME in the last 168 hours. Cardiac Enzymes: No results for input(s): CKTOTAL, CKMB, CKMBINDEX, TROPONINI in the last 168 hours. BNP (last 3 results) No results for  input(s): PROBNP in the last 8760 hours. HbA1C: No results for input(s): HGBA1C in the last 72 hours. CBG: Recent Labs  Lab 10/15/18 0451 10/15/18 0553 10/15/18 0649 10/15/18 0741 10/15/18 0850  GLUCAP 150* 180* 171* 158* 155*   Lipid Profile: No results for input(s): CHOL, HDL, LDLCALC, TRIG, CHOLHDL, LDLDIRECT in the last 72 hours. Thyroid Function Tests: No results for input(s): TSH, T4TOTAL, FREET4, T3FREE, THYROIDAB in the last 72 hours. Anemia Panel: No results for input(s): VITAMINB12, FOLATE, FERRITIN, TIBC, IRON, RETICCTPCT in the last 72 hours. Sepsis Labs: Recent Labs  Lab 10/14/18 0926 10/14/18 1318  LATICACIDVEN 5.41* 3.07*    Recent Results (from the past 240 hour(s))  MRSA PCR Screening     Status: None   Collection Time: 10/14/18  2:20 PM  Result Value Ref Range Status   MRSA by PCR NEGATIVE NEGATIVE Final    Comment:        The GeneXpert MRSA Assay (FDA approved for NASAL specimens only), is one component of a comprehensive MRSA colonization surveillance program. It is not intended to diagnose MRSA infection nor to guide or monitor treatment for MRSA infections. Performed at [facility], [address] [place], [place] [address]          Radiology Studies: Ct Head Wo Contrast  Result Date: 10/14/2018 CLINICAL DATA:  Confusion with severe headache yesterday. Diabetic ketoacidosis. EXAM: CT HEAD WITHOUT CONTRAST TECHNIQUE: Contiguous axial images were obtained from the base of the skull through the vertex without intravenous contrast. COMPARISON:  07/03/2017 FINDINGS: Brain: There is no evidence of acute infarct, intracranial hemorrhage, mass, midline shift, or extra-axial fluid collection. The ventricles and sulci are normal. Vascular: No hyperdense vessel. Skull: No fracture or focal osseous lesion. Sinuses/Orbits: Opacification of a single anterior left ethmoid air cell. Clear mastoid air cells. Dysconjugate gaze. Other:  None. IMPRESSION: Unremarkable CT appearance of the brain. Electronically Signed   By: Sebastian AcheAllen  Grady M.D.   On: 10/14/2018 11:03   Dg Chest Port 1 View  Result Date: 10/14/2018 CLINICAL DATA:  Diabetic ketoacidosis EXAM: PORTABLE CHEST 1 VIEW COMPARISON:  03/14/2018 FINDINGS: The heart size and mediastinal contours are within normal limits. Both lungs are clear. The visualized skeletal structures are unremarkable. IMPRESSION: No active disease. Electronically Signed   By: Elige KoHetal  Patel   On: 10/14/2018 15:45        Scheduled Meds: . chlorhexidine  15 mL Mouth Rinse BID  . heparin  5,000 Units Subcutaneous Q8H  . insulin aspart  0-5 Units Subcutaneous QHS  . insulin aspart  0-9 Units Subcutaneous TID WC  . insulin detemir  12 Units Subcutaneous Daily  . mouth rinse  15 mL Mouth Rinse  [id number]   Continuous Infusions: . dextrose 5 % and 0.45% NaCl 75 mL/hr at 10/15/18 0654  . lactated ringers Stopped (10/14/18 1707)     LOS: 1 day        Glade LloydKshitiz Yehuda Printup, MD [facility] Pager [phone]  If 7PM-7AM, please contact night-coverage [email or web address] Password [email or web address] 10/15/2018, 9:49 AM

## 2018-10-15 NOTE — Progress Notes (Signed)
Initial Nutrition Assessment  DOCUMENTATION CODES:   (Will assess for malnutrition at follow-up)  INTERVENTION:  - Will order Ensure Max BID, each supplement provides 350 kcal and 20 grams of protein.  - Continue to encourage PO intakes. - Will attempt NFPE at follow-up. - Will monitor for need for education.    NUTRITION DIAGNOSIS:   Increased nutrient needs related to acute illness as evidenced by estimated needs.  GOAL:   Patient will meet greater than or equal to 90% of their needs  MONITOR:   PO intake, Supplement acceptance, Weight trends, Labs, I & O's  REASON FOR ASSESSMENT:   Malnutrition Screening Tool  ASSESSMENT:   37 year old male with history of type 1 DM on insulin at home but has not been able to take his Levemir recently because of cost issues. He presented on 12/30 with N/V.  He was found to be hyperglycemic, acidotic, and obtunded; was found to be in DKA. He was started on insulin drip and was admitted by critical care team.  BMI indicates normal weight. No intakes documented since admission. Patient sleeping at time of visit with no family/visitors present. He did arouse to name call but remained very drowsy and responses were mumbled or whispered. He reported eating breakfast which consisted of scrambled eggs and sausage and that he was not experiencing any abdominal pain/pressure or nausea after eating. Unable to obtain any further information from patient at this time.   Per chart review, current weight is 175 lb and weight on 5/30 was 190 lb. This indicates 15 lb weight loss (8% body weight) in the past 7 months. This is not significant for time frame, but unsure if weight loss has occurred more acutely.    Medications reviewed; sliding scale Novolog, 12 units Levemir/day. Labs reviewed; CBGs: 114-180 mg/dL since [id number], Ca: 8.7 mg/dL. IVF; D5-1/2 NS @ 75 mL/hr (306 kcal).     NUTRITION - FOCUSED PHYSICAL EXAM:  Will attempt at follow-up.  Diet  Order:   Diet Order            Diet Carb Modified Fluid consistency: Thin; Room service appropriate? Yes  Diet effective now              EDUCATION NEEDS:   Not appropriate for education at this time  Skin:  Skin Assessment: Reviewed RN Assessment  Last BM:  12/29 (day PTA)  Height:   Ht Readings from Last 1 Encounters:  10/14/18 6\' 3"  (1.905 m)    Weight:   Wt Readings from Last 1 Encounters:  10/14/18 79.4 kg    Ideal Body Weight:  89.09 kg  BMI:  Body mass index is 21.87 kg/m.  Estimated Nutritional Needs:   Kcal:  2225-2540 kcal  Protein:  120-130 grams  Fluid:  >/= 2.2 L/day     Trenton GammonJessica Dmetrius Ambs, MS, RD, LDN, [facility] Inpatient Clinical Dietitian Pager # [phone] After hours/weekend pager # [phone]

## 2018-10-15 NOTE — Discharge Instructions (Addendum)
Call [employer] customer service on the back of your insurance card and obtain a primary care physician and an in network endocrinologist.      Edward GottronJoshua Walls was admitted to the Hospital on 10/14/2018 and Discharged on Discharge Date 10/17/2018 and should be excused from work/school   for 3  days starting 10/14/2018 , may return to work/school without any restrictions.  Call Lambert KetoAbraham Feliz MD, [facility] [phone] with questions.  Marinda ElkAbraham Feliz Ortiz M.D on 10/17/2018,at 9:33 AM  [facility] Office  [phone]

## 2018-10-15 NOTE — Progress Notes (Signed)
PCCM:  DKA management continued. Has met with Diabetic coordinator. AG gap closed. No longer needs ICU level care Critical care will be available as needed. Please call with any questions.   Garner Nash, DO [facility] 10/15/2018 8:33 AM  Personal pager: [phone] If unanswered, please page CCM On-call: [phone]

## 2018-10-15 NOTE — Progress Notes (Signed)
Inpatient Diabetes Program Recommendations  AACE/ADA: New Consensus Statement on Inpatient Glycemic Control (2015)  Target Ranges:  Prepandial:   less than 140 mg/dL      Peak postprandial:   less than 180 mg/dL (1-2 hours)      Critically ill patients:  140 - 180 mg/dL   Lab Results  Component Value Date   GLUCAP 411 (H) 10/15/2018   HGBA1C >15.5 (H) 10/14/2018    Review of Glycemic Control  Long discussion with pt regarding diabetes evolving into Type 1. Discussed HgbA1C of > 15.5%.  Goal should be 7%.Discussed importance of controlling blood sugars to prevent complications. Explained how diet, exercise, and stress management affect blood sugar control. Discussed importance of checking blood sugars and recording in logbook, and carry to Endo appt.   CBG 186 mg/dL. (Received 37 units Novolog in past 4.5 hours)  Orders: Levemir 12 units QD, Novolog 0-20 units tidwc and hs + 6 units tidwc  Blood sugar spiked to 411, 446 at lunch. Has received 37 units of Novolog thus far today.  Inpatient Diabetes Program Recommendations:     Levemir / Tresiba 21 units Q24H * Novolog 0-9 units tidwc and hs (Type 1 and sensitive to insulin For meal coverage - 7 units tidwc if pt eats > 50% meal. Do not give if blood sugar < 80 mg/dL.  Pt and family request referral to Endo and PCP. Will order OP Diabetes Education consult.  Gave pt Evaristo Buryresiba $0 copay card for 30 day supply. This will get him through until he sees Endocrinologist. Pt states he can manage co-pay with the Novolog prescription.  Pt has meter, strips and lancets at home.  Will need prescriptions for insulin pens, and insulin pen needles [phone]  Answered all questions of pt and family. Discussed above with RN.   Thank you. Ailene Ardshonda Delbra Zellars, RD, LDN, CDE Inpatient Diabetes Coordinator [phone]

## 2018-10-15 NOTE — Progress Notes (Signed)
Pt. arrived to floor from ICU via wheelchair. Pt. Alert and oriented x 4, no respiratory distress noted.

## 2018-10-16 LAB — GLUCOSE, CAPILLARY
Glucose-Capillary: 348 mg/dL — ABNORMAL HIGH (ref 70–99)
Glucose-Capillary: 293 mg/dL — ABNORMAL HIGH (ref 70–99)
Glucose-Capillary: 339 mg/dL — ABNORMAL HIGH (ref 70–99)
Glucose-Capillary: 238 mg/dL — ABNORMAL HIGH (ref 70–99)

## 2018-10-16 LAB — MAGNESIUM: Magnesium: 2 mg/dL (ref 1.7–2.4)

## 2018-10-16 LAB — CBC WITH DIFFERENTIAL/PLATELET
Abs Immature Granulocytes: 0.01 10*3/uL (ref 0.00–0.07)
BASOS ABS: 0 10*3/uL (ref 0.0–0.1)
Basophils Relative: 0 %
Eosinophils Absolute: 0 10*3/uL (ref 0.0–0.5)
Eosinophils Relative: 0 %
HCT: 41.3 % (ref 39.0–52.0)
Hemoglobin: 13.1 g/dL (ref 13.0–17.0)
Immature Granulocytes: 0 %
Lymphocytes Relative: 42 %
Lymphs Abs: 2.3 10*3/uL (ref 0.7–4.0)
MCH: 27.6 pg (ref 26.0–34.0)
MCHC: 31.7 g/dL (ref 30.0–36.0)
MCV: 86.9 fL (ref 80.0–100.0)
Monocytes Absolute: 0.6 10*3/uL (ref 0.1–1.0)
Monocytes Relative: 11 %
Neutro Abs: 2.6 10*3/uL (ref 1.7–7.7)
Neutrophils Relative %: 47 %
PLATELETS: 159 10*3/uL (ref 150–400)
RBC: 4.75 MIL/uL (ref 4.22–5.81)
RDW: 13.9 % (ref 11.5–15.5)
WBC: 5.4 10*3/uL (ref 4.0–10.5)
nRBC: 0 % (ref 0.0–0.2)

## 2018-10-16 LAB — BASIC METABOLIC PANEL
Anion gap: 12 (ref 5–15)
BUN: 24 mg/dL — ABNORMAL HIGH (ref 6–20)
CO2: 16 mmol/L — ABNORMAL LOW (ref 22–32)
Calcium: 8.4 mg/dL — ABNORMAL LOW (ref 8.9–10.3)
Chloride: 113 mmol/L — ABNORMAL HIGH (ref 98–111)
Creatinine, Ser: 0.99 mg/dL (ref 0.61–1.24)
GFR calc Af Amer: >60 mL/min (ref >60)
Glucose, Bld: 346 mg/dL — ABNORMAL HIGH (ref 70–99)
Potassium: 3.8 mmol/L (ref 3.5–5.1)
Sodium: 141 mmol/L (ref 135–145)

## 2018-10-16 MED ORDER — INSULIN DETEMIR 100 UNIT/ML ~~LOC~~ SOLN
15.0000 [IU] | Freq: Two times a day (BID) | SUBCUTANEOUS | Status: DC
  Administered 2018-10-16: 15 [IU] via SUBCUTANEOUS
  Filled 2018-10-16 (×2): qty 0.15

## 2018-10-16 NOTE — Progress Notes (Signed)
[facility] PROGRESS NOTE    Progress Note  Edward Walls  WJX:[id number] DOB: 03/13/1981 DOA: 10/14/2018 PCP: Shade FloodGreene, Jeffrey R, MD     Brief Narrative:   Edward Walls is an 38 y.o. male past medical history of diabetes mellitus type 1 was found to be hyperglycemic acidotic and obtunded and was found in DKA  Assessment/Plan:   Active Problems:   DKA (diabetic ketoacidoses) (HCC) Started on IV insulin on admission transition to long-acting insulin plus sliding scale. Basic metabolic panel this morning is pending. BG still ranging around 300 titrate insulin for better control.  Acute kidney injury: Likely prerenal improved with IV fluid hydration.  Leukocytosis:  likely reactive.  Now with non-anion gap metabolic acidosis hyperchloremic: Likely due to sodium chloride. Discontinue this and allow free water per mouth  DVT prophylaxis: lovenxo Family Communication:none Disposition Plan/Barrier to D/C: home in am once normal anion gap acidosis resolved Code Status:     Code Status Orders  (From admission, onward)         Start     Ordered   10/14/18 1247  Full code  Continuous     10/14/18 1251        Code Status History    Date Active Date Inactive Code Status Order ID Comments User Context   03/14/2018 0953 03/15/2018 2210 Full Code [id number]  Narda BondsNettey, Ralph A, MD Inpatient   03/14/2018 0800 03/14/2018 0953 Full Code [id number]  Narda BondsNettey, Ralph A, MD ED   07/03/2017 1959 07/06/2017 1351 Full Code [id number]  Lorretta HarpNiu, Xilin, MD ED   10/20/2016 1712 10/22/2016 2001 Full Code [id number]  Jerald Kiefhiu, Stephen K, MD ED        IV Access:    Peripheral IV   Procedures and diagnostic studies:   Dg Chest Port 1 View  Result Date: 10/14/2018 CLINICAL DATA:  Diabetic ketoacidosis EXAM: PORTABLE CHEST 1 VIEW COMPARISON:  03/14/2018 FINDINGS: The heart size and mediastinal contours are within normal limits. Both lungs are clear. The visualized skeletal structures are  unremarkable. IMPRESSION: No active disease. Electronically Signed   By: Elige KoHetal  Patel   On: 10/14/2018 15:45     Medical Consultants:    None.  Anti-Infectives:   None  Subjective:    Elige RadonJoshua A Yamamoto no complaint slightly nauseated but still tolerating his diet.  Objective:    Vitals:   10/15/18 1700 10/15/18 1833 10/15/18 2112 10/16/18 0505  BP: 112/70 129/85 122/72 113/75  Pulse: 89 90 81 75  Resp: 17 17 16 18   Temp:  98.8 F (37.1 C) 98.9 F (37.2 C) 98.4 F (36.9 C)  TempSrc:  Oral Oral Oral  SpO2: 100% 100% 98% 100%  Weight:      Height:        Intake/Output Summary (Last 24 hours) at 10/16/2018 1141 Last data filed at 10/16/2018 0940 Gross per 24 hour  Intake 3200 ml  Output 1200 ml  Net 2000 ml   Filed Weights   10/14/18 0942  Weight: 79.4 kg    Exam: General exam: In no acute distress. Respiratory system: Good air movement and clear to auscultation. Cardiovascular system: S1 & S2 heard, RRR. Gastrointestinal system: Abdomen is nondistended, soft and nontender.  Central nervous system: Alert and oriented. No focal neurological deficits. Extremities: No pedal edema. Skin: No rashes, lesions or ulcers Psychiatry: Judgement and insight appear normal. Mood & affect appropriate.    Data Reviewed:    Labs: Basic Metabolic Panel: Recent Labs  Lab 10/14/18 0914  10/14/18 0926 10/14/18 1437 10/14/18 1916 10/15/18 0507 10/15/18 1207 10/16/18 0425  NA 136 136 141 142 141  --  141  K 4.6 4.7 4.4 4.0 3.6  --  3.8  CL 102 109 114* 114* 113*  --  113*  CO2 <7*  --  <7* 10* 17*  --  16*  GLUCOSE 588* 601* 346* 228* 185* 446* 346*  BUN 20 23* 18 17 20   --  24*  CREATININE 1.74* 1.10 1.42* 1.24 1.01  --  0.99  CALCIUM 9.0  --  8.8* 8.7* 8.7*  --  8.4*  MG  --   --  2.2  --   --   --  2.0  PHOS  --   --  2.9  --   --   --   --    GFR Estimated Creatinine Clearance: 117 mL/min (by C-G formula based on SCr of 0.99 mg/dL). Liver Function  Tests: Recent Labs  Lab 10/14/18 0914  AST 34  ALT 30  ALKPHOS 79  BILITOT 2.0*  PROT 8.3*  ALBUMIN 5.1*   No results for input(s): LIPASE, AMYLASE in the last 168 hours. No results for input(s): AMMONIA in the last 168 hours. Coagulation profile No results for input(s): INR, PROTIME in the last 168 hours.  CBC: Recent Labs  Lab 10/14/18 0914 10/14/18 0926 10/14/18 1437 10/16/18 0425  WBC 15.5*  --  16.4* 5.4  NEUTROABS  --   --   --  2.6  HGB 16.4 19.0* 16.0 13.1  HCT 54.5* 56.0* 53.5* 41.3  MCV 92.4  --  89.3 86.9  PLT 255  --  239 159   Cardiac Enzymes: No results for input(s): CKTOTAL, CKMB, CKMBINDEX, TROPONINI in the last 168 hours. BNP (last 3 results) No results for input(s): PROBNP in the last 8760 hours. CBG: Recent Labs  Lab 10/15/18 1150 10/15/18 1624 10/15/18 2111 10/16/18 0743 10/16/18 1132  GLUCAP 411* 186* 243* 348* 293*   D-Dimer: No results for input(s): DDIMER in the last 72 hours. Hgb A1c: Recent Labs    10/14/18 1437  HGBA1C >15.5*   Lipid Profile: No results for input(s): CHOL, HDL, LDLCALC, TRIG, CHOLHDL, LDLDIRECT in the last 72 hours. Thyroid function studies: No results for input(s): TSH, T4TOTAL, T3FREE, THYROIDAB in the last 72 hours.  Invalid input(s): FREET3 Anemia work up: No results for input(s): VITAMINB12, FOLATE, FERRITIN, TIBC, IRON, RETICCTPCT in the last 72 hours. Sepsis Labs: Recent Labs  Lab 10/14/18 0914 10/14/18 0926 10/14/18 1318 10/14/18 1437 10/16/18 0425  WBC 15.5*  --   --  16.4* 5.4  LATICACIDVEN  --  5.41* 3.07*  --   --    Microbiology Recent Results (from the past 240 hour(s))  MRSA PCR Screening     Status: None   Collection Time: 10/14/18  2:20 PM  Result Value Ref Range Status   MRSA by PCR NEGATIVE NEGATIVE Final    Comment:        The GeneXpert MRSA Assay (FDA approved for NASAL specimens only), is one component of a comprehensive MRSA colonization surveillance program. It is  not intended to diagnose MRSA infection nor to guide or monitor treatment for MRSA infections. Performed at [facility], [address], [place], [place] [address]      Medications:   . chlorhexidine  15 mL Mouth Rinse BID  . heparin  5,000 Units Subcutaneous Q8H  . insulin aspart  0-20 Units Subcutaneous TID WC  .  insulin aspart  0-5 Units Subcutaneous QHS  . insulin aspart  7 Units Subcutaneous TID WC  . insulin detemir  21 Units Subcutaneous Daily  . mouth rinse  15 mL Mouth Rinse q12n4p  . ENSURE MAX PROTEIN  11 oz Oral BID   Continuous Infusions: . sodium chloride 100 mL/hr at 10/16/18 0819     LOS: 2 days   Marinda ElkAbraham Feliz Ortiz  [facility]   *Please refer to [email or web address], password [email or web address] to get updated schedule on who will round on this patient, as hospitalists switch teams weekly. If 7PM-7AM, please contact night-coverage at [email or web address], password [email or web address] for any overnight needs.  10/16/2018, 11:41 AM

## 2018-10-17 DIAGNOSIS — E1165 Type 2 diabetes mellitus with hyperglycemia: Secondary | ICD-10-CM

## 2018-10-17 DIAGNOSIS — Z794 Long term (current) use of insulin: Secondary | ICD-10-CM

## 2018-10-17 LAB — BASIC METABOLIC PANEL
Anion gap: 8 (ref 5–15)
BUN: 25 mg/dL — ABNORMAL HIGH (ref 6–20)
CO2: 26 mmol/L (ref 22–32)
CREATININE: 0.74 mg/dL (ref 0.61–1.24)
Calcium: 8.3 mg/dL — ABNORMAL LOW (ref 8.9–10.3)
Chloride: 106 mmol/L (ref 98–111)
GFR calc Af Amer: >60 mL/min (ref >60)
GFR calc non Af Amer: >60 mL/min (ref >60)
Glucose, Bld: 258 mg/dL — ABNORMAL HIGH (ref 70–99)
Potassium: 2.8 mmol/L — ABNORMAL LOW (ref 3.5–5.1)
Sodium: 140 mmol/L (ref 135–145)

## 2018-10-17 LAB — GLUCOSE, CAPILLARY
Glucose-Capillary: 205 mg/dL — ABNORMAL HIGH (ref 70–99)
Glucose-Capillary: 268 mg/dL — ABNORMAL HIGH (ref 70–99)

## 2018-10-17 MED ORDER — POTASSIUM CHLORIDE CRYS ER 20 MEQ PO TBCR
40.0000 meq | EXTENDED_RELEASE_TABLET | Freq: Two times a day (BID) | ORAL | Status: AC
  Administered 2018-10-17 (×2): 40 meq via ORAL
  Filled 2018-10-17 (×2): qty 2

## 2018-10-17 MED ORDER — INSULIN DETEMIR 100 UNIT/ML ~~LOC~~ SOLN
20.0000 [IU] | Freq: Two times a day (BID) | SUBCUTANEOUS | Status: DC
  Administered 2018-10-17: 20 [IU] via SUBCUTANEOUS
  Filled 2018-10-17 (×2): qty 0.2

## 2018-10-17 MED ORDER — BASAGLAR KWIKPEN 100 UNIT/ML ~~LOC~~ SOPN: 30.0000 [IU] | PEN_INJECTOR | Freq: Every day | SUBCUTANEOUS | 0 refills | Status: DC

## 2018-10-17 MED ORDER — INSULIN ASPART 100 UNIT/ML CARTRIDGE (PENFILL): 10.0000 [IU] | Freq: Three times a day (TID) | SUBCUTANEOUS | 11 refills | Status: DC

## 2018-10-17 MED ORDER — INSULIN DETEMIR 100 UNIT/ML FLEXPEN: 30.0000 [IU] | PEN_INJECTOR | Freq: Every day | SUBCUTANEOUS | 0 refills | Status: DC

## 2018-10-17 NOTE — Progress Notes (Signed)
Inpatient Diabetes Program Recommendations  AACE/ADA: New Consensus Statement on Inpatient Glycemic Control (2015)  Target Ranges:  Prepandial:   less than 140 mg/dL      Peak postprandial:   less than 180 mg/dL (1-2 hours)      Critically ill patients:  140 - 180 mg/dL   Lab Results  Component Value Date   GLUCAP 205 (H) 10/17/2018   HGBA1C >15.5 (H) 10/14/2018    Review of Glycemic Control  Pt states insurance has very high co-pay for Levemir. This is reason he was not taking. Have given him Basaglar card with $0 copay. Could you order Basaglar instead of Levemir since pt cannot afford? Has had no problems in getting his Novolog and I'm unsure if [employer] prefers Humalog vs Novolog. He states he's been able to afford the Novolog. Consider switching from Humalog pen to Novolog flexpen. Also, consider increasing Novolog from 5 units tidwc to 10 units tidwc. Has been on Novolog 7 units tidwc and post-prandials still high.  Discussed with RN and paged MD.  Thank you.  Thank you. Ailene Ards, RD, LDN, CDE Inpatient Diabetes Coordinator [phone]

## 2018-10-17 NOTE — Discharge Summary (Addendum)
Physician Discharge Summary  Edward Walls UKG:[id number] DOB: [birth year] DOA: 10/14/2018  PCP: Edward Agreste, MD  Admit date: 10/14/2018 Discharge date: 10/17/2018  Admitted From: home Disposition:  Home  Recommendations for Outpatient Follow-up:  1. Follow up with PCP in 1-2 weeks 2. Please obtain BMP/CBC in one week   Home Health:No Equipment/Devices:None  Discharge Condition:stable CODE STATUS:Full Diet recommendation: Heart Healthy  Brief/Interim Summary: 38 y.o. male past medical history of diabetes mellitus type 1 was found to be hyperglycemic acidotic and obtunded and was found in DKA   Discharge Diagnoses:  Active Problems:   DKA (diabetic ketoacidoses) (Edward Walls)   AKI (acute kidney injury) (Edward Walls) likely due to noncompliance. He was started on IV insulin his anion gap closed his bicarb improved to greater than 20. His long-acting insulin was increased to 30 units daily plus sliding scale.  Acute kidney injury: Likely prerenal azotemia resolved with IV fluid hydration.   Discharge Instructions  Discharge Instructions    Ambulatory referral to Nutrition and Diabetic Education   Complete by:  As directed    Diet - low sodium heart healthy   Complete by:  As directed    Increase activity slowly   Complete by:  As directed      Allergies as of 10/17/2018      Reactions   Peanut-containing Drug Products Other (See Comments)   Throat burning       Medication List    STOP taking these medications   insulin lispro 100 UNIT/ML KiwkPen Commonly known as:  HUMALOG KWIKPEN     TAKE these medications   BASAGLAR KWIKPEN 100 UNIT/ML Sopn Inject 0.3 mLs (30 Units total) into the skin daily.   blood glucose meter kit and supplies Kit Dispense based on patient and insurance preference. Use up to four times daily as directed. (FOR ICD-9 250.00, 250.01).   FREESTYLE LIBRE READER Devi 1 Device by Does not apply route 4 (four) times daily.   FREESTYLE LIBRE  SENSOR SYSTEM Misc 1 sensor every 10 days   insulin aspart cartridge Commonly known as:  NOVOLOG PENFILL Inject 10 Units into the skin 3 (three) times daily with meals.   Insulin Detemir 100 UNIT/ML Pen Commonly known as:  LEVEMIR FLEXPEN Inject 30 Units into the skin daily at 10 pm. What changed:  how much to take   Insulin Pen Needle 31G X 5 MM Misc 1 Device by Does not apply route QID.       Allergies  Allergen Reactions  . Peanut-Containing Drug Products Other (See Comments)    Throat burning     Consultations:  None   Procedures/Studies: Ct Head Wo Contrast  Result Date: 10/14/2018 CLINICAL DATA:  Confusion with severe headache yesterday. Diabetic ketoacidosis. EXAM: CT HEAD WITHOUT CONTRAST TECHNIQUE: Contiguous axial images were obtained from the base of the skull through the vertex without intravenous contrast. COMPARISON:  07/03/2017 FINDINGS: Brain: There is no evidence of acute infarct, intracranial hemorrhage, mass, midline shift, or extra-axial fluid collection. The ventricles and sulci are normal. Vascular: No hyperdense vessel. Skull: No fracture or focal osseous lesion. Sinuses/Orbits: Opacification of a single anterior left ethmoid air cell. Clear mastoid air cells. Dysconjugate gaze. Other: None. IMPRESSION: Unremarkable CT appearance of the brain. Electronically Signed   By: Logan Bores M.D.   On: 10/14/2018 11:03   Dg Chest Port 1 View  Result Date: 10/14/2018 CLINICAL DATA:  Diabetic ketoacidosis EXAM: PORTABLE CHEST 1 VIEW COMPARISON:  03/14/2018 FINDINGS: The heart size and  mediastinal contours are within normal limits. Both lungs are clear. The visualized skeletal structures are unremarkable. IMPRESSION: No active disease. Electronically Signed   By: Kathreen Devoid   On: 10/14/2018 15:45   (Echo, Carotid, EGD, Colonoscopy, ERCP)    Subjective:  Complaints feels great. Discharge Exam: Vitals:   10/17/18 0535 10/17/18 1403  BP: 128/81 123/74   Pulse: 66 94  Resp: 16 18  Temp: 98.4 F (36.9 C) 98.9 F (37.2 C)  SpO2: 98% 98%   Vitals:   10/16/18 1344 10/16/18 2049 10/17/18 0535 10/17/18 1403  BP: 115/62 116/80 128/81 123/74  Pulse: 96 75 66 94  Resp: _0 Temp: 98.6 F (37 C) 99 F (37.2 C) 98.4 F (36.9 C) 98.9 F (37.2 C)  TempSrc: Oral Oral Oral Oral  SpO2: 98% 99% 98% 98%  Weight:      Height:        General: Pt is alert, awake, not in acute distress Cardiovascular: RRR, S1/S2 +, no rubs, no gallops Respiratory: CTA bilaterally, no wheezing, no rhonchi Abdominal: Soft, NT, ND, bowel sounds + Extremities: no edema, no cyanosis    The results of significant diagnostics from this hospitalization (including imaging, microbiology, ancillary and laboratory) are listed below for reference.     Microbiology: Recent Results (from the past 240 hour(s))  MRSA PCR Screening     Status: None   Collection Time: 10/14/18  2:20 PM  Result Value Ref Range Status   MRSA by PCR NEGATIVE NEGATIVE Final    Comment:        The GeneXpert MRSA Assay (FDA approved for NASAL specimens only), is one component of a comprehensive MRSA colonization surveillance program. It is not intended to diagnose MRSA infection nor to guide or monitor treatment for MRSA infections. Performed at [facility], [place] [address] [place], [place] [address]      Labs: BNP (last 3 results) No results for input(s): BNP in the last 8760 hours. Basic Metabolic Panel: Recent Labs  Lab 10/14/18 1437 10/14/18 1916 10/15/18 0507 10/15/18 1207 10/16/18 0425 10/17/18 0343  NA 141 142 141  --  141 140  K 4.4 4.0 3.6  --  3.8 2.8*  CL 114* 114* 113*  --  113* 106  CO2 <7* 10* 17*  --  16* 26  GLUCOSE 346* 228* 185* 446* 346* 258*  BUN _1 --  24* 25*  CREATININE 1.42* 1.24 1.01  --  0.99 0.74  CALCIUM 8.8* 8.7* 8.7*  --  8.4* 8.3*  MG 2.2  --   --   --  2.0  --   PHOS 2.9  --   --   --   --   --     Liver Function Tests: Recent Labs  Lab 10/14/18 0914  AST 34  ALT 30  ALKPHOS 79  BILITOT 2.0*  PROT 8.3*  ALBUMIN 5.1*   No results for input(s): LIPASE, AMYLASE in the last 168 hours. No results for input(s): AMMONIA in the last 168 hours. CBC: Recent Labs  Lab 10/14/18 0914 10/14/18 0926 10/14/18 1437 10/16/18 0425  WBC 15.5*  --  16.4* 5.4  NEUTROABS  --   --   --  2.6  HGB 16.4 19.0* 16.0 13.1  HCT 54.5* 56.0* 53.5* 41.3  MCV 92.4  --  89.3 86.9  PLT 255  --  239 159   Cardiac Enzymes: No results for input(s): CKTOTAL, CKMB, CKMBINDEX, TROPONINI  in the last 168 hours. BNP: Invalid input(s): POCBNP CBG: Recent Labs  Lab 10/16/18 1132 10/16/18 1730 10/16/18 2050 10/17/18 0733 10/17/18 1136  GLUCAP 293* 339* 238* 205* 268*   D-Dimer No results for input(s): DDIMER in the last 72 hours. Hgb A1c No results for input(s): HGBA1C in the last 72 hours. Lipid Profile No results for input(s): CHOL, HDL, LDLCALC, TRIG, CHOLHDL, LDLDIRECT in the last 72 hours. Thyroid function studies No results for input(s): TSH, T4TOTAL, T3FREE, THYROIDAB in the last 72 hours.  Invalid input(s): FREET3 Anemia work up No results for input(s): VITAMINB12, FOLATE, FERRITIN, TIBC, IRON, RETICCTPCT in the last 72 hours. Urinalysis    Component Value Date/Time   COLORURINE STRAW (A) 10/14/2018 1016   APPEARANCEUR CLEAR 10/14/2018 1016   LABSPEC 1.022 10/14/2018 1016   PHURINE 5.0 10/14/2018 1016   GLUCOSEU >=500 (A) 10/14/2018 1016   HGBUR SMALL (A) 10/14/2018 1016   BILIRUBINUR NEGATIVE 10/14/2018 1016   KETONESUR 80 (A) 10/14/2018 1016   PROTEINUR 100 (A) 10/14/2018 1016   NITRITE NEGATIVE 10/14/2018 1016   LEUKOCYTESUR NEGATIVE 10/14/2018 1016   Sepsis Labs Invalid input(s): PROCALCITONIN,  WBC,  LACTICIDVEN Microbiology Recent Results (from the past 240 hour(s))  MRSA PCR Screening     Status: None   Collection Time: 10/14/18  2:20 PM  Result Value Ref Range  Status   MRSA by PCR NEGATIVE NEGATIVE Final    Comment:        The GeneXpert MRSA Assay (FDA approved for NASAL specimens only), is one component of a comprehensive MRSA colonization surveillance program. It is not intended to diagnose MRSA infection nor to guide or monitor treatment for MRSA infections. Performed at [facility], [place] [address] [place], [place] [address]      Time coordinating discharge: 40 minutes  SIGNED:   Charlynne Cousins, MD  [facility] 10/17/2018, 2:38 PM Pager   If 7PM-7AM, please contact night-coverage [email or web address] Password [email or web address]

## 2018-10-18 ENCOUNTER — Ambulatory Visit: Payer: Self-pay

## 2018-10-18 NOTE — Telephone Encounter (Signed)
Agree with urgent care eval

## 2018-10-18 NOTE — Telephone Encounter (Signed)
Spoke with Triage RN.  Pt reports shock-type pain at injection site rated 9-10/10 intermittent since self-administered injection at 1100 with Kwikpen.  See triage note for details.  Recommended patient go to urgent care to rule out adverse reaction and PCP will be advised.

## 2018-10-18 NOTE — Telephone Encounter (Signed)
Returned call to patient who states that he has just been released form the hospital yesterday.  He states that he was started on a new insulin,Basaglar quick pen. Today he injected his first dose.  After about an hour he started to experience a sharp  electric shock pain to the injection site of his leg.  He states that he tried to massage the area but the pain continued. He rates the pain at 9-10 but stated it would come and go.  He presently has no pain.  He denies rash, swelling difficulty breathing. A clinical call was placed to the office. I spoke to [place].  Per protocol pt will go to urgent care tonight for evaluation of this symptom.  Pt verbalized understanding of all instructions.    Reason for Disposition . [1] SEVERE pain (e.g., excruciating, unable to do any normal activities) AND [2] not improved after 2 hours of pain medicine  Answer Assessment - Initial Assessment Questions 1. ONSET: "When did the pain start?"      After injection 2. LOCATION: "Where is the pain located?"      Leg injection site 3. PAIN: "How bad is the pain?"    (Scale 1-10; or mild, moderate, severe)   -  MILD (1-3): doesn't interfere with normal activities    -  MODERATE (4-7): interferes with normal activities (e.g., work or school) or awakens from sleep, limping    -  SEVERE (8-10): excruciating pain, unable to do any normal activities, unable to walk     Pinching sensation 9-10 lasted like a electric shock 4. WORK OR EXERCISE: "Has there been any recent work or exercise that involved this part of the body?"      No 5. CAUSE: "What do you think is causing the leg pain?"     lantus pen 6. OTHER SYMPTOMS: "Do you have any other symptoms?" (e.g., chest pain, back pain, breathing difficulty, swelling, rash, fever, numbness, weakness)     no 7. PREGNANCY: "Is there any chance you are pregnant?" "When was your last menstrual period?"     N/A  Protocols used: LEG PAIN-A-AH

## 2018-10-21 ENCOUNTER — Ambulatory Visit: Payer: Managed Care, Other (non HMO) | Admitting: Family Medicine

## 2018-10-21 ENCOUNTER — Encounter: Payer: Self-pay | Admitting: Family Medicine

## 2018-10-21 VITALS — BP 134/78 | HR 88 | Temp 97.8°F | Ht 75.0 in | Wt 169.0 lb

## 2018-10-21 DIAGNOSIS — E139 Other specified diabetes mellitus without complications: Secondary | ICD-10-CM | POA: Diagnosis not present

## 2018-10-21 LAB — BASIC METABOLIC PANEL
BUN: 16 mg/dL (ref 6–23)
CO2: 31 mEq/L (ref 19–32)
Calcium: 8.8 mg/dL (ref 8.4–10.5)
Chloride: 96 mEq/L (ref 96–112)
Creatinine, Ser: 0.89 mg/dL (ref 0.40–1.50)
GFR: 124.5 mL/min (ref >60.00)
Glucose, Bld: 479 mg/dL — ABNORMAL HIGH (ref 70–99)
Potassium: 4.2 mEq/L (ref 3.5–5.1)
Sodium: 134 mEq/L — ABNORMAL LOW (ref 135–145)

## 2018-10-21 MED ORDER — FREESTYLE LIBRE READER DEVI: 1.0000 | Freq: Four times a day (QID) | 0 refills | Status: DC

## 2018-10-21 MED ORDER — FREESTYLE LIBRE SENSOR SYSTEM MISC: 2 refills | Status: DC

## 2018-10-21 MED ORDER — INSULIN DEGLUDEC 200 UNIT/ML ~~LOC~~ SOPN: 30.0000 [IU] | PEN_INJECTOR | Freq: Every day | SUBCUTANEOUS | 2 refills | Status: DC

## 2018-10-21 NOTE — Patient Instructions (Signed)
Diabetes Basics    Diabetes (diabetes mellitus) is a long-term (chronic) disease. It occurs when the body does not properly use sugar (glucose) that is released from food after you eat.  Diabetes may be caused by one or both of these problems:  · Your pancreas does not make enough of a hormone called insulin.  · Your body does not react in a normal way to insulin that it makes.  Insulin lets sugars (glucose) go into cells in your body. This gives you energy. If you have diabetes, sugars cannot get into cells. This causes high blood sugar (hyperglycemia).  Follow these instructions at home:  How is diabetes treated?  You may need to take insulin or other diabetes medicines daily to keep your blood sugar in balance. Take your diabetes medicines every day as told by your doctor. List your diabetes medicines here:  Diabetes medicines  · Name of medicine: ______________________________  ? Amount (dose): _______________ Time (a.m./p.m.): _______________ Notes: ___________________________________  · Name of medicine: ______________________________  ? Amount (dose): _______________ Time (a.m./p.m.): _______________ Notes: ___________________________________  · Name of medicine: ______________________________  ? Amount (dose): _______________ Time (a.m./p.m.): _______________ Notes: ___________________________________  If you use insulin, you will learn how to give yourself insulin by injection. You may need to adjust the amount based on the food that you eat. List the types of insulin you use here:  Insulin  · Insulin type: ______________________________  ? Amount (dose): _______________ Time (a.m./p.m.): _______________ Notes: ___________________________________  · Insulin type: ______________________________  ? Amount (dose): _______________ Time (a.m./p.m.): _______________ Notes: ___________________________________  · Insulin type: ______________________________  ? Amount (dose): _______________ Time (a.m./p.m.):  _______________ Notes: ___________________________________  · Insulin type: ______________________________  ? Amount (dose): _______________ Time (a.m./p.m.): _______________ Notes: ___________________________________  · Insulin type: ______________________________  ? Amount (dose): _______________ Time (a.m./p.m.): _______________ Notes: ___________________________________  How do I manage my blood sugar?    Check your blood sugar levels using a blood glucose monitor as directed by your doctor.  Your doctor will set treatment goals for you. Generally, you should have these blood sugar levels:  · Before meals (preprandial): 80-130 mg/dL (4.4-7.2 mmol/L).  · After meals (postprandial): below 180 mg/dL (10 mmol/L).  · A1c level: less than 7%.  Write down the times that you will check your blood sugar levels:  Blood sugar checks  · Time: _______________ Notes: ___________________________________  · Time: _______________ Notes: ___________________________________  · Time: _______________ Notes: ___________________________________  · Time: _______________ Notes: ___________________________________  · Time: _______________ Notes: ___________________________________  · Time: _______________ Notes: ___________________________________    What do I need to know about low blood sugar?  Low blood sugar is called hypoglycemia. This is when blood sugar is at or below 70 mg/dL (3.9 mmol/L). Symptoms may include:  · Feeling:  ? Hungry.  ? Worried or nervous (anxious).  ? Sweaty and clammy.  ? Confused.  ? Dizzy.  ? Sleepy.  ? Sick to your stomach (nauseous).  · Having:  ? A fast heartbeat.  ? A headache.  ? A change in your vision.  ? Tingling or no feeling (numbness) around the mouth, lips, or tongue.  ? Jerky movements that you cannot control (seizure).  · Having trouble with:  ? Moving (coordination).  ? Sleeping.  ? Passing out (fainting).  ? Getting upset easily (irritability).  Treating low blood sugar  To treat low blood  sugar, eat or drink something sugary right away. If you can think clearly and swallow safely, follow the 15:15   rule:  · Take 15 grams of a fast-acting carb (carbohydrate). Talk with your doctor about how much you should take.  · Some fast-acting carbs are:  ? Sugar tablets (glucose pills). Take 3-4 glucose pills.  ? 6-8 pieces of hard candy.  ? 4-6 oz (120-150 mL) of fruit juice.  ? 4-6 oz (120-150 mL) of regular (not diet) soda.  ? 1 Tbsp (15 mL) honey or sugar.  · Check your blood sugar 15 minutes after you take the carb.  · If your blood sugar is still at or below 70 mg/dL (3.9 mmol/L), take 15 grams of a carb again.  · If your blood sugar does not go above 70 mg/dL (3.9 mmol/L) after 3 tries, get help right away.  · After your blood sugar goes back to normal, eat a meal or a snack within 1 hour.  Treating very low blood sugar  If your blood sugar is at or below 54 mg/dL (3 mmol/L), you have very low blood sugar (severe hypoglycemia). This is an emergency. Do not wait to see if the symptoms will go away. Get medical help right away. Call your local emergency services (911 in the [place] Do not drive yourself to the hospital.  Questions to ask your health care provider  · Do I need to meet with a diabetes educator?  · What equipment will I need to care for myself at home?  · What diabetes medicines do I need? When should I take them?  · How often do I need to check my blood sugar?  · What number can I call if I have questions?  · When is my next doctor's visit?  · Where can I find a support group for people with diabetes?  Where to find more information  · American Diabetes Association: www.diabetes.org  · American Association of Diabetes Educators: www.diabeteseducator.org/patient-resources  Contact a doctor if:  · Your blood sugar is at or above 240 mg/dL (13.3 mmol/L) for 2 days in a row.  · You have been sick or have had a fever for 2 days or more, and you are not getting better.  · You have any of these  problems for more than 6 hours:  ? You cannot eat or drink.  ? You feel sick to your stomach (nauseous).  ? You throw up (vomit).  ? You have watery poop (diarrhea).  Get help right away if:  · Your blood sugar is lower than 54 mg/dL (3 mmol/L).  · You get confused.  · You have trouble:  ? Thinking clearly.  ? Breathing.  Summary  · Diabetes (diabetes mellitus) is a long-term (chronic) disease. It occurs when the body does not properly use sugar (glucose) that is released from food after digestion.  · Take insulin and diabetes medicines as told.  · Check your blood sugar every day, as often as told.  · Keep all follow-up visits as told by your doctor. This is important.  This information is not intended to replace advice given to you by your health care provider. Make sure you discuss any questions you have with your health care provider.  Document Released: 01/04/2018 Document Revised: 03/25/2018 Document Reviewed: 01/04/2018  Elsevier Interactive Patient Education © 2019 [employer]

## 2018-10-21 NOTE — Progress Notes (Signed)
[name] - 38 y.o. male MRN [id number]  Date of birth: 07/02/1981  Subjective Chief Complaint  Patient presents with  . Establish Care    Was hosptialized 10/14/18 for DKA-placed on Basaglar-has been tolerating daily    HPI Edward Walls is 38 y.o. male here today for hospital follow up.  He was hospitalized recently for DKA.  Reports that he was diagnosed with Type 2 diabetes about 2 years ago.  He has had multiple admissions for DKA.  He reports that he had not been compliant with insulin regimen recently due to cost.  Has insulin at home now (basaglar and novolog) and has been using as directed.  Currently using basaglar 30 units and novolog 6 units TID with meals.  He has a coupon for tresiba that was given to him at the hospital and includes free trial of freestyle [place] system.  He would like to switch to this.  He also has an appointment with endocrinology in the next couple of weeks.  He denies nausea or vomiting, polyuria or polydipsia at this time.   ROS:  A comprehensive ROS was completed and negative except as noted per HPI  Allergies  Allergen Reactions  . Peanut-Containing Drug Products Other (See Comments)    Throat burning     Past Medical History:  Diagnosis Date  . Allergy   . Diabetes mellitus without complication [facility])     Past Surgical History:  Procedure Laterality Date  . DENTAL SURGERY      Social History   Socioeconomic History  . Marital status: Single    Spouse name: Not on file  . Number of children: Not on file  . Years of education: Not on file  . Highest education level: Not on file  Occupational History  . Not on file  Social Needs  . Financial resource strain: Not on file  . Food insecurity:    Worry: Not on file    Inability: Not on file  . Transportation needs:    Medical: Not on file    Non-medical: Not on file  Tobacco Use  . Smoking status: Former Smoker    Types: Cigarettes  . Smokeless tobacco: Never Used    Substance and Sexual Activity  . Alcohol use: Yes    Alcohol/week: 9.0 standard drinks    Types: 1 Cans of beer, 8 Shots of liquor per week  . Drug use: Not Currently  . Sexual activity: Not on file  Lifestyle  . Physical activity:    Days per week: Not on file    Minutes per session: Not on file  . Stress: Not on file  Relationships  . Social connections:    Talks on phone: Not on file    Gets together: Not on file    Attends religious service: Not on file    Active member of club or organization: Not on file    Attends meetings of clubs or organizations: Not on file    Relationship status: Not on file  Other Topics Concern  . Not on file  Social History Narrative  . Not on file    Family History  Problem Relation Age of Onset  . Diabetes Mellitus II Mother   . Diabetes Mellitus II Father   . Diabetes Mellitus II Brother     Health Maintenance  Topic Date Due  . OPHTHALMOLOGY EXAM  12/19/1992  . FOOT EXAM  07/23/2018  . URINE MICROALBUMIN  08/30/2018  . INFLUENZA VACCINE  01/14/2019 (Originally 05/16/2018)  . HEMOGLOBIN A1C  04/15/2019  . TETANUS/TDAP  08/31/2027  . PNEUMOCOCCAL POLYSACCHARIDE VACCINE AGE 57-64 HIGH RISK  Completed  . HIV Screening  Completed    ----------------------------------------------------------------------------------------------------------------------------------------------------------------------------------------------------------------- Physical Exam BP 134/78   Pulse 88   Temp 97.8 F (36.6 C) (Oral)   Ht 6\' 3"  (1.905 m)   Wt 169 lb (76.7 kg)   SpO2 97%   BMI 21.12 kg/m   Physical Exam Constitutional:      Appearance: Normal appearance. He is not ill-appearing.  HENT:     Head: Normocephalic and atraumatic.     Mouth/Throat:     Mouth: Mucous membranes are moist.  Eyes:     General: No scleral icterus. Cardiovascular:     Rate and Rhythm: Normal rate and regular rhythm.  Pulmonary:     Effort: Pulmonary effort is  normal.     Breath sounds: Normal breath sounds.  Skin:    General: Skin is warm and dry.  Neurological:     General: No focal deficit present.     Mental Status: He is alert.  Psychiatric:        Mood and Affect: Mood normal.     ------------------------------------------------------------------------------------------------------------------------------------------------------------------------------------------------------------------- Assessment and Plan  Diabetes mellitus type 1.5, managed as type 1 (HCC) -History of non-compliance with multiple episodes of DKA -Changed basaglar to tresiba, continue at 30 units daily -Continue novolog at current dosing -Update BMP today -Declines flu vaccine -He'll keep f/u with endocrinology.

## 2018-10-21 NOTE — Assessment & Plan Note (Signed)
-  History of non-compliance with multiple episodes of DKA -Changed basaglar to tresiba, continue at 30 units daily -Continue novolog at current dosing -Update BMP today -Declines flu vaccine -He'll keep f/u with endocrinology.

## 2018-10-25 ENCOUNTER — Encounter: Payer: Self-pay | Admitting: *Deleted

## 2018-10-31 ENCOUNTER — Ambulatory Visit (INDEPENDENT_AMBULATORY_CARE_PROVIDER_SITE_OTHER): Payer: Managed Care, Other (non HMO) | Admitting: Internal Medicine

## 2018-10-31 ENCOUNTER — Encounter: Payer: Self-pay | Admitting: Internal Medicine

## 2018-10-31 VITALS — BP 120/70 | Ht 75.0 in | Wt 174.0 lb

## 2018-10-31 DIAGNOSIS — IMO0001 Reserved for inherently not codable concepts without codable children: Secondary | ICD-10-CM

## 2018-10-31 DIAGNOSIS — Z794 Long term (current) use of insulin: Secondary | ICD-10-CM | POA: Diagnosis not present

## 2018-10-31 DIAGNOSIS — E1165 Type 2 diabetes mellitus with hyperglycemia: Secondary | ICD-10-CM | POA: Diagnosis not present

## 2018-10-31 DIAGNOSIS — E119 Type 2 diabetes mellitus without complications: Secondary | ICD-10-CM | POA: Diagnosis not present

## 2018-10-31 LAB — GLUCOSE, POCT (MANUAL RESULT ENTRY): POC Glucose: 200 mg/dl — AB (ref 70–99)

## 2018-10-31 MED ORDER — INSULIN LISPRO 200 UNIT/ML ~~LOC~~ SOPN: 9.0000 [IU] | PEN_INJECTOR | Freq: Three times a day (TID) | SUBCUTANEOUS | 5 refills | Status: DC

## 2018-10-31 MED ORDER — INSULIN DEGLUDEC 200 UNIT/ML ~~LOC~~ SOPN: 40.0000 [IU] | PEN_INJECTOR | Freq: Every day | SUBCUTANEOUS | 2 refills | Status: AC

## 2018-10-31 MED ORDER — GLUCAGON 3 MG/DOSE NA POWD: 3.0000 mg | Freq: Once | NASAL | 99 refills | Status: DC | PRN

## 2018-10-31 MED ORDER — FREESTYLE LIBRE 14 DAY SENSOR MISC: 1.0000 | 3 refills | Status: DC

## 2018-10-31 MED ORDER — GLUCAGON (RDNA) 1 MG IJ KIT: 1.0000 mg | PACK | Freq: Once | INTRAMUSCULAR | 99 refills | Status: DC | PRN

## 2018-10-31 NOTE — Patient Instructions (Addendum)
Please continue: - Tresiba U200 40 units in am  Please increase: - Humalog 9-12 units 3x a day, before meals  Please call and schedule an eye appt with Dr. Randon Goldsmith: [facility]:  Dr. Jeni Salles MD ?  Address: [address] [place], [place], [place] [address]  [phone]  Please return in 1.5 months.  PATIENT INSTRUCTIONS FOR TYPE 2 DIABETES:  **Please join MyChart!** - see attached instructions about how to join if you have not done so already.  DIET AND EXERCISE Diet and exercise is an important part of diabetic treatment.  We recommended aerobic exercise in the form of brisk walking (working between 40-60% of maximal aerobic capacity, similar to brisk walking) for 150 minutes per week (such as 30 minutes five days per week) along with 3 times per week performing 'resistance' training (using various gauge rubber tubes with handles) 5-10 exercises involving the major muscle groups (upper body, lower body and core) performing 10-15 repetitions (or near fatigue) each exercise. Start at half the above goal but build slowly to reach the above goals. If limited by weight, joint pain, or disability, we recommend daily walking in a swimming pool with water up to waist to reduce pressure from joints while allow for adequate exercise.    BLOOD GLUCOSES Monitoring your blood glucoses is important for continued management of your diabetes. Please check your blood glucoses 2-4 times a day: fasting, before meals and at bedtime (you can rotate these measurements - e.g. one day check before the 3 meals, the next day check before 2 of the meals and before bedtime, etc.).   HYPOGLYCEMIA (low blood sugar) Hypoglycemia is usually a reaction to not eating, exercising, or taking too much insulin/ other diabetes drugs.  Symptoms include tremors, sweating, hunger, confusion, headache, etc. Treat IMMEDIATELY with 15 grams of Carbs: . 4 glucose tablets .  cup regular juice/soda . 2  tablespoons raisins . 4 teaspoons sugar . 1 tablespoon honey Recheck blood glucose in 15 mins and repeat above if still symptomatic/blood glucose <100.  RECOMMENDATIONS TO REDUCE YOUR RISK OF DIABETIC COMPLICATIONS: * Take your prescribed MEDICATION(S) * Follow a DIABETIC diet: Complex carbs, fiber rich foods, (monounsaturated and polyunsaturated) fats * AVOID saturated/trans fats, high fat foods, >2,300 mg salt per day. * EXERCISE at least 5 times a week for 30 minutes or preferably daily.  * DO NOT SMOKE OR DRINK more than 1 drink a day. * Check your FEET every day. Do not wear tightfitting shoes. Contact us if you develop an ulcer * See your EYE doctor once a year or more if needed * Get a FLU shot once a year * Get a PNEUMONIA vaccine once before and once after age 69 years  GOALS:  * Your Hemoglobin A1c of <7%  * fasting sugars need to be <130 * after meals sugars need to be <180 (2h after you start eating) * Your Systolic BP should be 140 or lower  * Your Diastolic BP should be 80 or lower  * Your HDL (Good Cholesterol) should be 40 or higher  * Your LDL (Bad Cholesterol) should be 100 or lower. * Your Triglycerides should be 150 or lower  * Your Urine microalbumin (kidney function) should be <30 * Your Body Mass Index should be 25 or lower    Please consider the following ways to cut down carbs and fat and increase fiber and micronutrients in your diet: - substitute whole grain for white bread or pasta -  substitute brown rice for white rice - substitute 90-calorie flat bread pieces for slices of bread when possible - substitute sweet potatoes or yams for white potatoes - substitute humus for margarine - substitute tofu for cheese when possible - substitute almond or rice milk for regular milk (would not drink soy milk daily due to concern for soy estrogen influence on breast cancer risk) - substitute dark chocolate for other sweets when possible - substitute water - can  add lemon or orange slices for taste - for diet sodas (artificial sweeteners will trick your body that you can eat sweets without getting calories and will lead you to overeating and weight gain in the long run) - do not skip breakfast or other meals (this will slow down the metabolism and will result in more weight gain over time)  - can try smoothies made from fruit and almond/rice milk in am instead of regular breakfast - can also try old-fashioned (not instant) oatmeal made with almond/rice milk in am - order the dressing on the side when eating salad at a restaurant (pour less than half of the dressing on the salad) - eat as little meat as possible - can try juicing, but should not forget that juicing will get rid of the fiber, so would alternate with eating raw veg./fruits or drinking smoothies - use as little oil as possible, even when using olive oil - can dress a salad with a mix of balsamic vinegar and lemon juice, for e.g. - use agave nectar, stevia sugar, or regular sugar rather than artificial sweateners - steam or broil/roast veggies  - snack on veggies/fruit/nuts (unsalted, preferably) when possible, rather than processed foods - reduce or eliminate aspartame in diet (it is in diet sodas, chewing gum, etc) Read the labels!  Try to read Dr. Katherina Right book: "Program for Reversing Diabetes" for other ideas for healthy eating.

## 2018-10-31 NOTE — Addendum Note (Signed)
Addended by: Darliss Ridgel I on: 10/31/2018 12:54 PM   Modules accepted: Orders

## 2018-10-31 NOTE — Progress Notes (Signed)
Patient ID: Edward HaileyJoshua A Callaham, male   DOB: 02/01/1981, 38 y.o.   MRN: [id number]   HPI: Edward Walls is a 38 y.o.-year-old male, referred by Dr. David StallFeliz-Ortiz, for management of DM, dx in 2017, insulin-dependent, uncontrolled, with complications (recurrent DKA episodes).  Previous DKA episodes happened because he could not afford his insulin.  Currently on Tresiba and Humalog, which are affordable.  Reviewed latest HbA1c level: Lab Results  Component Value Date   HGBA1C >15.5 (H) 10/14/2018   HGBA1C 15.9 (H) 03/14/2018   HGBA1C 14.5 (H) 07/04/2017   HGBA1C 12.0 (H) 10/20/2016  07/05/2017: GAD Ab <5  Pt is on a regimen of: - Tresiba U200 30 >> 40 units in am - Humalog 6 units 3x a day, before meals  Pt checks his sugars >4x a day and they are:  Freestyle Libre CGM parameters: - average: 244 +/-  95.2 - coefficient of variation: 39% (19-25%) - time in range:  - low (<70): 0% - normal (70-180): 30% - high (>180): 70%  - am (before b'fast - bedtime): 180-260 - 2h after b'fast: 330-400 - before lunch: n/c - 2h after lunch: n/c - before dinner (waking up): 180-260 - 2h after dinner: 120-260 - bedtime: n/c - nighttime: n/c Lowest sugar was 60 (Libre CGM); he has hypoglycemia awareness at 80s.  Highest sugar was 454.  Glucometer: Freestyle Libre  Pt's meals are: - Breakfast: oatmeal + banana - Lunch: meatloaf, chilli, salad - Dinner: meat + veggies - Snacks: 8 pm  - crackers, at work >> works nights Stopped desserts after last hospitalization.  He is exercising in the gym, lifting weights every day.  - no CKD, last BUN/creatinine:  Lab Results  Component Value Date   BUN 16 10/21/2018   BUN 25 (H) 10/17/2018   CREATININE 0.89 10/21/2018   CREATININE 0.74 10/17/2018   - last set of lipids: No results found for: CHOL, HDL, LDLCALC, LDLDIRECT, TRIG, CHOLHDL  - No prev. eye exams.  - no numbness and tingling in his feet.  Pt has FH of DM in M, F, all 3 of his  brothers - all have DM2.  ROS: Constitutional: no weight gain, + weight loss, + fatigue, no subjective hyperthermia, no subjective hypothermia, + nocturia, + excessive urination Eyes: no blurry vision, no xerophthalmia ENT: no sore throat, no nodules palpated in neck, no dysphagia, no odynophagia, no hoarseness, no tinnitus, no hypoacusis Cardiovascular: no CP, + SOB, no palpitations, no leg swelling Respiratory: no cough, + SOB, no wheezing Gastrointestinal: + N, +V, no D, no C, no acid reflux Musculoskeletal: no muscle, no joint aches Skin: no rash, no hair loss Neurological: no tremors, no numbness or tingling/no dizziness/no HAs Psychiatric: no depression, no anxiety  Past Medical History:  Diagnosis Date  . Allergy   . Diabetes mellitus without complication [facility](HCC)    Past Surgical History:  Procedure Laterality Date  . DENTAL SURGERY     Social History   Socioeconomic History  . Marital status: Single    Spouse name: Not on file  . Number of children:  To: 13 and 17 10/2008  . Years of education: Not on file  . Highest education level: Not on file  Occupational History  .  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 Financial resource strain: Not on file  . Food insecurity:    Worry: Not on file    Inability: Not on file  . Transportation needs:    Medical: Not  on file    Non-medical: Not on file  Tobacco Use  . Smoking status: Former Smoker    Types: Cigarettes  . Smokeless tobacco: Never Used  Substance and Sexual Activity  . Alcohol use:  no    Alcohol/week:    Types:  Marland Kitchen Drug use: Not Currently  . Sexual activity: Not on file  Lifestyle  . Physical activity:    Days per week: Not on file    Minutes per session: Not on file  . Stress: Not on file  Relationships  . Social connections:    Talks on phone: Not on file    Gets together: Not on file    Attends religious service: Not on file    Active member of club or organization: Not on file     Attends meetings of clubs or organizations: Not on file    Relationship status: Not on file  . Intimate partner violence:    Fear of current or ex partner: Not on file    Emotionally abused: Not on file    Physically abused: Not on file    Forced sexual activity: Not on file  Other Topics Concern  . Not on file  Social History Narrative  . Not on file   Current Outpatient Medications on File Prior to Visit  Medication Sig Dispense Refill  . Continuous Blood Gluc Receiver (FREESTYLE LIBRE READER) DEVI 1 Device by Does not apply route 4 (four) times daily. 1 Device 0  . Continuous Blood Gluc Sensor (FREESTYLE LIBRE SENSOR SYSTEM) MISC 1 sensor every 14 days 3 each 2  . insulin aspart (NOVOLOG PENFILL) cartridge Inject 10 Units into the skin 3 (three) times daily with meals. 15 mL 11  . Insulin Detemir (LEVEMIR FLEXPEN) 100 UNIT/ML Pen Inject 30 Units into the skin daily at 10 pm. 15 mL 0  . Insulin Pen Needle 31G X 5 MM MISC 1 Device by Does not apply route QID. 100 each 2   No current facility-administered medications on file prior to visit.    Allergies  Allergen Reactions  . Peanut-Containing Drug Products Other (See Comments)    Throat burning    Family History  Problem Relation Age of Onset  . Diabetes Mellitus II Mother   . Diabetes Mellitus II Father   . Diabetes Mellitus II Brother    PE: BP 120/70   Ht 6\' 3"  (1.905 m)   Wt 174 lb (78.9 kg)   SpO2 98%   BMI 21.75 kg/m  Wt Readings from Last 3 Encounters:  10/31/18 174 lb (78.9 kg)  10/21/18 169 lb (76.7 kg)  10/14/18 175 lb (79.4 kg)        Constitutional: normal weight, in NAD Eyes: PERRLA, EOMI, no exophthalmos ENT: moist mucous membranes, no thyromegaly, no cervical lymphadenopathy Cardiovascular: RRR, No MRG Respiratory: CTA B Gastrointestinal: abdomen soft, NT, ND, BS+ Musculoskeletal: no deformities, strength intact in all 4 Skin: moist, warm, no rashes Neurological: no tremor with outstretched  hands, DTR normal in all 4  ASSESSMENT: 1. DMinsulin-dependent, uncontrolled, without long-term complications, but with DKA episodes  PLAN:  1. Patient with long-standing, uncontrolled diabetes, on  Basal-bolus insulin regimen, which became insufficient.  He is on a high dose of long-acting insulin Evaristo Bury) and low dose of rapid acting insulin with meals.  He has a very high blood sugar after breakfast, after which sugars decrease and stayed fairly stable until the next breakfast.  He works nights and tells me that he comes  home and goes to bed and then afterwards wakes up to eat breakfast.  He is bolusing 6 units with this meal.  We discussed that this is not enough and will increase the Humalog dose to 9 units and then even 12 units if still needed.  We discussed that a good physiologic regimen will have proximal to the same amount of long-acting and short-acting insulin per day. -We also discussed about the fact that we need to check him for type 1 diabetes.  He had GAD antibodies that were negative in the past, but will need to also check his ICA and zinc transporter antibodies.  I also would like to check his insulin production.  However, his CBG was 200 now so we cannot check his C-peptide.  We will do so at next visit. -We also discussed that in the near future, after we confirm type 1 diabetes, will probably need to switch to Humalog dosing based on insulin to carb ratios.  He had carb counting training. - I suggested to:  Patient Instructions  Please continue: - Tresiba U200 40 units in am  Please increase: - Humalog 9-12 units 3x a day, before meals  Please call and schedule an eye appt with Dr. Randon Goldsmith: [facility]:  Dr. Jeni Salles MD ?  Address: [address] [place], [place], [place] [address]  [phone]  Please return in 1.5 months.  - Continue checking sugars at different times of the day - check at least 4 x a day, rotating checks - discussed about  CBG targets for treatment: 80-130 mg/dL before meals and <329 mg/dL after meals; target JJO8C <7%. - given foot care handout and explained the principles  - given instructions for hypoglycemia management "15-15 rule"  - sent glucagon prescription to his pharmacy requesting how to use it and also introduce it - advised for yearly eye exams >> given Dr. Randon Goldsmith information to establish care - Return to clinic in 3 mo with sugar log   Carlus Pavlov, MD PhD [facility] Endocrinology

## 2018-12-12 ENCOUNTER — Other Ambulatory Visit: Payer: Self-pay

## 2018-12-12 ENCOUNTER — Encounter: Payer: Self-pay | Admitting: Internal Medicine

## 2018-12-12 ENCOUNTER — Ambulatory Visit (INDEPENDENT_AMBULATORY_CARE_PROVIDER_SITE_OTHER): Payer: Managed Care, Other (non HMO) | Admitting: Internal Medicine

## 2018-12-12 VITALS — BP 120/70 | HR 78 | Ht 75.0 in | Wt 187.0 lb

## 2018-12-12 DIAGNOSIS — E1165 Type 2 diabetes mellitus with hyperglycemia: Secondary | ICD-10-CM

## 2018-12-12 DIAGNOSIS — Z794 Long term (current) use of insulin: Secondary | ICD-10-CM | POA: Diagnosis not present

## 2018-12-12 DIAGNOSIS — IMO0001 Reserved for inherently not codable concepts without codable children: Secondary | ICD-10-CM

## 2018-12-12 LAB — TSH: TSH: 0.59 u[IU]/mL (ref 0.35–4.50)

## 2018-12-12 LAB — LIPID PANEL
Cholesterol: 76 mg/dL (ref 0–200)
HDL: 34.1 mg/dL — AB (ref >39.00)
LDL Cholesterol: 36 mg/dL (ref 0–99)
NONHDL: 42
TRIGLYCERIDES: 32 mg/dL (ref 0.0–149.0)
Total CHOL/HDL Ratio: 2
VLDL: 6.4 mg/dL (ref 0.0–40.0)

## 2018-12-12 LAB — GLUCOSE, POCT (MANUAL RESULT ENTRY): POC Glucose: 152 mg/dl — AB (ref 70–99)

## 2018-12-12 LAB — MICROALBUMIN / CREATININE URINE RATIO
Creatinine,U: 97.7 mg/dL
Microalb Creat Ratio: 0.7 mg/g (ref 0.0–30.0)
Microalb, Ur: <0.7 mg/dL (ref 0.0–1.9)

## 2018-12-12 MED ORDER — INSULIN LISPRO 200 UNIT/ML ~~LOC~~ SOPN: 12.0000 [IU] | PEN_INJECTOR | Freq: Three times a day (TID) | SUBCUTANEOUS | 5 refills | Status: DC

## 2018-12-12 NOTE — Progress Notes (Signed)
Patient ID: Edward Walls, male   DOB: July 18, 1981, 38 y.o.   MRN: [id number]   HPI: Edward Walls is a 38 y.o.-year-old male, returning for follow-up for DM, dx in 2017, insulin-dependent, uncontrolled, with complications (recurrent DKA episodes).  Last visit 1.5 months ago.  Sugars improved after increasing his Humalog doses at last visit.  Reviewed latest HbA1c level: Lab Results  Component Value Date   HGBA1C >15.5 (H) 10/14/2018   HGBA1C 15.9 (H) 03/14/2018   HGBA1C 14.5 (H) 07/04/2017   HGBA1C 12.0 (H) 10/20/2016  07/05/2017: GAD Ab <5  Pt is on a regimen of: - Tresiba U200 30 >> 40 units at 11-12 pm - Humalog 6 >> 9-12 units 3x a day, before meals He had problems affording insulins in the past, but [place] and Humalog are covered.  He checks his sugars more than 4 times a day with his libre CGM  Freestyle libre CGM parameters: - average: 244 +/-  95.2 >> 160 - Active CGM time: 84% of the time - Glucose variability 31.8% (target less than or equal to 36%) - time in range:  - very low (<54): 1% - low (64-69): 3% - normal range (70-180): 67% - high sugars (181-250): 24% - very high sugars (>250): 5%   Lowest sugar was 60 (Libre CGM) >> 53; he has hypoglycemia awareness in the 80s. Highest sugar was 454 >> 300s x1 - milk shake. He had 1 previous DKA episode at the time when he could not afford his insulin.  Glucometer: Freestyle Libre  Pt's meals are: - Breakfast: oatmeal + banana - Lunch: meatloaf, chilli, salad - Dinner: meat + veggies - Snacks: 8 pm  - crackers, at work >> works nights Stopped desserts after last hospitalization.  He is exercising at the gym.  He lifts weights every day.  No CKD, last BUN/creatinine:  Lab Results  Component Value Date   BUN 16 10/21/2018   BUN 25 (H) 10/17/2018   CREATININE 0.89 10/21/2018   CREATININE 0.74 10/17/2018   No lipids available for review: No results found for: CHOL, HDL, LDLCALC, LDLDIRECT, TRIG,  CHOLHDL  - no previous eye exams!  - no  numbness and tingling in his feet.  Pt has FH of DM in M, F, all 3 of his brothers - all have DM2.  ROS: Constitutional: no weight gain/no weight loss, no fatigue, no subjective hyperthermia, no subjective hypothermia Eyes: no blurry vision, no xerophthalmia ENT: no sore throat, no nodules palpated in neck, no dysphagia, no odynophagia, no hoarseness Cardiovascular: no CP/no SOB/no palpitations/no leg swelling Respiratory: no cough/no SOB/no wheezing Gastrointestinal: no N/no V/no D/no C/no acid reflux Musculoskeletal: no muscle aches/no joint aches Skin: no rashes, no hair loss Neurological: no tremors/no numbness/no tingling/no dizziness  I reviewed pt's medications, allergies, PMH, social hx, family hx, and changes were documented in the history of present illness. Otherwise, unchanged from my initial visit note.  Past Medical History:  Diagnosis Date  . Allergy   . Diabetes mellitus without complication [facility])    Past Surgical History:  Procedure Laterality Date  . DENTAL SURGERY     Social History   Socioeconomic History  . Marital status: Single    Spouse name: Not on file  . Number of children:  To: 13 and 17 10/2008  . Years of education: Not on file  . Highest education level: Not on file  Occupational History  .  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  Financial resource strain: Not on file  . Food insecurity:    Worry: Not on file    Inability: Not on file  . Transportation needs:    Medical: Not on file    Non-medical: Not on file  Tobacco Use  . Smoking status: Former Smoker    Types: Cigarettes  . Smokeless tobacco: Never Used  Substance and Sexual Activity  . Alcohol use:  no    Alcohol/week:    Types:  Marland Kitchen. Drug use: Not Currently  . Sexual activity: Not on file  Lifestyle  . Physical activity:    Days per week: Not on file    Minutes per session: Not on file  . Stress: Not on file    Relationships  . Social connections:    Talks on phone: Not on file    Gets together: Not on file    Attends religious service: Not on file    Active member of club or organization: Not on file    Attends meetings of clubs or organizations: Not on file    Relationship status: Not on file  . Intimate partner violence:    Fear of current or ex partner: Not on file    Emotionally abused: Not on file    Physically abused: Not on file    Forced sexual activity: Not on file  Other Topics Concern  . Not on file  Social History Narrative  . Not on file   Current Outpatient Medications on File Prior to Visit  Medication Sig Dispense Refill  . Continuous Blood Gluc Receiver (FREESTYLE LIBRE READER) DEVI 1 Device by Does not apply route 4 (four) times daily. 1 Device 0  . Continuous Blood Gluc Sensor (FREESTYLE LIBRE 14 DAY SENSOR) MISC 1 each by Does not apply route every 14 (fourteen) days. Change every 2 weeks 6 each 3  . Glucagon (BAQSIMI ONE PACK) 3 MG/DOSE POWD Place 3 mg into the nose once as needed for up to 1 dose. 1 each prn  . glucagon 1 MG injection Inject 1 mg into the muscle once as needed for up to 1 dose. 1 each prn  . Insulin Degludec (TRESIBA FLEXTOUCH) 200 UNIT/ML SOPN Inject 40 Units into the skin daily. 6 mL 2  . Insulin Lispro (HUMALOG KWIKPEN) 200 UNIT/ML SOPN Inject 9-12 Units into the skin 3 (three) times daily before meals. 30 mL 5  . Insulin Pen Needle 31G X 5 MM MISC 1 Device by Does not apply route QID. 100 each 2   No current facility-administered medications on file prior to visit.    Allergies  Allergen Reactions  . Peanut-Containing Drug Products Other (See Comments)    Throat burning    Family History  Problem Relation Age of Onset  . Diabetes Mellitus II Mother   . Diabetes Mellitus II Father   . Diabetes Mellitus II Brother    PE: BP 120/70   Pulse 78   Ht 6\' 3"  (1.905 m)   Wt 187 lb (84.8 kg)   SpO2 98%   BMI 23.37 kg/m  Wt Readings from  Last 3 Encounters:  12/12/18 187 lb (84.8 kg)  10/31/18 174 lb (78.9 kg)  10/21/18 169 lb (76.7 kg)        Constitutional: normal weight, in NAD Eyes: PERRLA, EOMI, no exophthalmos ENT: moist mucous membranes, no thyromegaly, no cervical lymphadenopathy Cardiovascular: RRR, No MRG Respiratory: CTA B Gastrointestinal: abdomen soft, NT, ND, BS+ Musculoskeletal: no deformities, strength intact in all 4  Skin: moist, warm, no rashes Neurological: no tremor with outstretched hands, DTR normal in all 4  ASSESSMENT: 1. DM, insulin-dependent, uncontrolled, without long-term complications, but with DKA episodes  PLAN:  1. Patient with longstanding, uncontrolled, insulin-dependent diabetes, with history of DKA in the setting of not affording his insulins, but unclear if type I or type II.  He is on the basal-bolus insulin regimen, which was adjusted at last visit.  In the past, he was using a high dose of long-acting insulin and too little short-acting insulin.  We raised the doses of his Humalog at last visit.  He works nights, which complicates his management.  At last visit, he had high blood sugars after breakfast, after which sugars were decreasing during the day and stayed fairly stable until the next breakfast.  He usually goes to bed after he comes home but then wakes up to eat breakfast.  He was only bolusing 6 units with this meal and we increased this dose. -We will need to check him for type 1 diabetes.  His GAD antibodies were negative, but will need to also check his ICA and zinc transporter antibodies and also his insulin production by checking a C-peptide and fasting glucose.  However, these should be checked when his sugars are lower than 200.  If he has type 1 diabetes, we will need to start an insulin carb ratio.  He had carb counting training in the past. -At this visit, sugars appear improved from last visit.  We reviewed together his freestyle libre CGM traces and he still appears to  have significant hyperglycemia after breakfast.  We discussed about increasing the insulin with this meal.  Later in the day, the changes are not as drastic, but he occasionally may have a lower blood sugar in the evening.  We discussed that these can be 20 points lower than his real blood sugars so he needs to check with his glucometer if he has low readings from his CBGs.  For now, we will continue the current dose of Tresiba.  If the low blood sugars continue, we may need to decrease this. - I suggested to:  Patient Instructions  Please continue: - Tresiba 40 units daily  Change: - Humalog  12-14 units before meals  Please stop at the lab.  KEEP UP THE GOOD WORK!  Please return in 2-3 months with your sugar log.   - continue checking sugars at different times of the day - check 4x a day, rotating checks - advised for yearly eye exams >> he is not UTD - We will check annual labs today + his insulin production (his glucose in the office was 152): Orders Placed This Encounter  Procedures  . TSH  . COMPLETE METABOLIC PANEL WITH GFR  . Microalbumin / creatinine urine ratio  . Lipid panel  . Glucose, fasting  . C-peptide  . Anti-islet cell antibody  . ZNT8 Antibodies  . POCT glucose (manual entry)  - Return to clinic in 3 mo with sugar log    - time spent with the patient: 15 min, of which >50% was spent in reviewing his CGM downloads (we will scan these), discussing his hypo- and hyper-glycemic episodes, reviewing previous labs and insulin doses and developing a plan to avoid hypo- and hyper-glycemia.   Component     Latest Ref Rng & Units 12/12/2018          Glucose     65 - 99 mg/dL 951 (H)  BUN  7 - 25 mg/dL 15  Creatinine     1.61 - 1.35 mg/dL 0.96  GFR, Est Non African American     > OR = 60 mL/min/1.23m2 102  GFR, Est African American     > OR = 60 mL/min/1.18m2 118  BUN/Creatinine Ratio     6 - 22 (calc) NOT APPLICABLE  Sodium     135 - 146 mmol/L 140    Potassium     3.5 - 5.3 mmol/L 4.1  Chloride     98 - 110 mmol/L 105  CO2     20 - 32 mmol/L 29  Calcium     8.6 - 10.3 mg/dL 9.2  Total Protein     6.1 - 8.1 g/dL 6.1  Albumin MSPROF     3.6 - 5.1 g/dL 4.0  Globulin     1.9 - 3.7 g/dL (calc) 2.1  AG Ratio     1.0 - 2.5 (calc) 1.9  Total Bilirubin     0.2 - 1.2 mg/dL 0.8  Alkaline phosphatase (APISO)     36 - 130 U/L 44  AST     10 - 40 U/L 15  ALT     9 - 46 U/L 13  Cholesterol     0 - 200 mg/dL 76  Triglycerides     0.0 - 149.0 mg/dL 04.5  HDL Cholesterol     >39.00 mg/dL 40.98 (L)  VLDL     0.0 - 40.0 mg/dL 6.4  LDL (calc)     0 - 99 mg/dL 36  Total CHOL/HDL Ratio      2  NonHDL      42.00  Microalb, Ur     0.0 - 1.9 mg/dL <1.1  Creatinine,U     mg/dL 91.4  MICROALB/CREAT RATIO     0.0 - 30.0 mg/g 0.7  POC Glucose     70 - 99 mg/dl 782 (A)  TSH     9.56 - 4.50 uIU/mL 0.59  Glucose, Plasma     65 - 99 mg/dL 213 (H)  C-Peptide     0.80 - 3.85 ng/mL 0.52 (L)  Islet Cell Ab     Neg:<1:1 Negative  ZNT8 Antibodies     U/mL <15   Insulin deficiency with normal anti-pancreatic antibodies.  Labs confirm type 1 diabetes.  We will need to continue with the current regimen of basal-bolus insulin. TSH normal. ACR normal.  Kidney function also normal. Lipids at goal with the exception of a slightly low HDL.  Carlus Pavlov, MD PhD [facility] Endocrinology

## 2018-12-12 NOTE — Patient Instructions (Signed)
Please continue: - Tresiba 40 units daily  Change: - Humalog  12-14 units before meals  Please stop at the lab.  KEEP UP THE GOOD WORK!  Please return in 2-3 months with your sugar log.

## 2018-12-13 LAB — COMPLETE METABOLIC PANEL WITH GFR
AG RATIO: 1.9 (calc) (ref 1.0–2.5)
ALT: 13 U/L (ref 9–46)
AST: 15 U/L (ref 10–40)
Albumin: 4 g/dL (ref 3.6–5.1)
Alkaline phosphatase (APISO): 44 U/L (ref 36–130)
BUN: 15 mg/dL (ref 7–25)
CO2: 29 mmol/L (ref 20–32)
Calcium: 9.2 mg/dL (ref 8.6–10.3)
Chloride: 105 mmol/L (ref 98–110)
Creat: 0.96 mg/dL (ref 0.60–1.35)
GFR, Est African American: 118 mL/min/{1.73_m2} (ref >=60)
GFR, Est Non African American: 102 mL/min/{1.73_m2} (ref >=60)
Globulin: 2.1 g/dL (calc) (ref 1.9–3.7)
Glucose, Bld: 140 mg/dL — ABNORMAL HIGH (ref 65–99)
POTASSIUM: 4.1 mmol/L (ref 3.5–5.3)
SODIUM: 140 mmol/L (ref 135–146)
Total Bilirubin: 0.8 mg/dL (ref 0.2–1.2)
Total Protein: 6.1 g/dL (ref 6.1–8.1)

## 2018-12-13 LAB — C-PEPTIDE: C-Peptide: 0.52 ng/mL — ABNORMAL LOW (ref 0.80–3.85)

## 2018-12-13 LAB — GLUCOSE, FASTING: Glucose, Plasma: 144 mg/dL — ABNORMAL HIGH (ref 65–99)

## 2018-12-16 LAB — ANTI-ISLET CELL ANTIBODY: Islet Cell Ab: NEGATIVE

## 2018-12-16 LAB — ZNT8 ANTIBODIES: ZNT8 Antibodies: <15 U/mL

## 2018-12-17 ENCOUNTER — Telehealth: Payer: Self-pay

## 2018-12-17 NOTE — Telephone Encounter (Signed)
-----   Message from Carlus Pavlov, MD sent at 12/16/2018  5:25 PM EST ----- [name], can you please call pt:  He appears to have insulin deficiency but has normal anti-pancreatic antibodies.  This actually confirms type 1 diabetes.  We will need to continue with the current regimen of basal-bolus insulin. TSH normal. Urinary proteins normal.  Kidney function also normal. Lipids at goal with the exception of a slightly low HDL.

## 2018-12-17 NOTE — Telephone Encounter (Signed)
Left message for patient to return our call at [phone].  

## 2018-12-18 NOTE — Telephone Encounter (Signed)
Left message for patient to return our call at [phone].  

## 2018-12-18 NOTE — Telephone Encounter (Signed)
Per [facility], "Caller states he is returning [place] call."

## 2018-12-18 NOTE — Telephone Encounter (Signed)
Notified patient of message from Dr. Gherghe, patient expressed understanding and agreement. No further questions.  

## 2019-01-08 ENCOUNTER — Telehealth: Payer: Self-pay | Admitting: Family Medicine

## 2019-01-08 ENCOUNTER — Other Ambulatory Visit: Payer: Self-pay | Admitting: Family Medicine

## 2019-01-08 DIAGNOSIS — E139 Other specified diabetes mellitus without complications: Secondary | ICD-10-CM

## 2019-01-08 NOTE — Telephone Encounter (Signed)
Copied from CRM [id number]. Topic: Quick Communication - Rx Refill/Question >> Jan 08, 2019  4:28 PM Tamela Oddi wrote: Medication: Continuous Blood Gluc Receiver (FREESTYLE LIBRE READER) DEVI  Patient called to request a refill for the above meter.  Patient stated his previous meter broke.  Preferred Pharmacy (with phone number or street name): [facility] [address], [place] - [address] [phone] (Phone) [phone] (Fax)

## 2019-01-09 MED ORDER — FREESTYLE LIBRE READER DEVI: 1.0000 | Freq: Four times a day (QID) | 0 refills | Status: DC

## 2019-01-09 NOTE — Telephone Encounter (Signed)
New one sent

## 2019-01-09 NOTE — Telephone Encounter (Signed)
Pt requested new meter due to his current one broke.

## 2019-01-29 LAB — BLOOD GAS, VENOUS
Bicarbonate: 4.2 mmol/L — ABNORMAL LOW (ref 20.0–28.0)
FIO2: 21
O2 Saturation: 69.7 %
PCO2 VEN: 24.2 mmHg — AB (ref 44.0–60.0)
Patient temperature: 98.6
pH, Ven: 6.875 — CL (ref 7.250–7.430)
pO2, Ven: 43.2 mmHg (ref 32.0–45.0)

## 2019-03-25 NOTE — Progress Notes (Signed)
Patient ID: Edward Walls, male   DOB: 01/07/1981, 38 y.o.   MRN: [id number]     HPI: Edward HaileyJoshua A Buccellato is a 38 y.o.-year-old male, returning for follow-up for DM, dx in 2017, insulin-dependent, uncontrolled, with complications (recurrent DKA episodes).  Last visit 3.5 months ago.  Sugars improved after increasing his Humalog doses before last visit  Reviewed latest HbA1c level: Lab Results  Component Value Date   HGBA1C >15.5 (H) 10/14/2018   HGBA1C 15.9 (H) 03/14/2018   HGBA1C 14.5 (H) 07/04/2017   HGBA1C 12.0 (H) 10/20/2016  07/05/2017: GAD Ab <5  Pt is on a regimen of: - Tresiba 40 units daily - Humalog 12-14 units before meals He had problems affording insulins in the past, but [place] and Humalog are covered.  He checks his sugars >4 times a day with his libre CGM  Freestyle libre CGM parameters: - average: 244 +/-  95.2 >> 160 - Active CGM time: 84% of the time - Glucose variability 31.8% (target less than or equal to 36%) - time in range:  - very low (<54): 1% - low (64-69): 3% - normal range (70-180): 67% - high sugars (181-250): 24% - very high sugars (>250): 5%   Lowest sugar was 60 (Libre CGM) >> 53 >> **; he has hypoglycemia awareness in the 80s. Highest sugar was 454 >> 300s x1 - milk shake >> **. He had 1 previous DKA episode in the past when he could not afford insulin.  Glucometer: Freestyle Libre  Pt's meals are: - Breakfast: oatmeal + banana - Lunch: meatloaf, chilli, salad - Dinner: meat + veggies - Snacks: 8 pm  - crackers, at work >> works nights Stopped desserts after last hospitalization.  He was exercising at the gym before the coronavirus pandemic, lifting weights every day.  No CKD, last BUN/creatinine:  Lab Results  Component Value Date   BUN 15 12/12/2018   BUN 16 10/21/2018   CREATININE 0.96 12/12/2018   CREATININE 0.89 10/21/2018  Not on ACE inhibitor or ARB.  Latest lipids: Lab Results  Component Value Date   CHOL 76  12/12/2018   HDL 34.10 (L) 12/12/2018   LDLCALC 36 12/12/2018   TRIG 32.0 12/12/2018   CHOLHDL 2 12/12/2018  He is not on a statin.  -He did not have a previous eye exam.  He did not schedule one since last visit.  - no numbness and tingling in his feet.  Pt has FH of DM in M, F, all 3 of his brothers - all have DM2.  ROS: Constitutional: no weight gain/no weight loss, no fatigue, no subjective hyperthermia, no subjective hypothermia Eyes: no blurry vision, no xerophthalmia ENT: no sore throat, no nodules palpated in neck, no dysphagia, no odynophagia, no hoarseness Cardiovascular: no CP/no SOB/no palpitations/no leg swelling Respiratory: no cough/no SOB/no wheezing Gastrointestinal: no N/no V/no D/no C/no acid reflux Musculoskeletal: no muscle aches/no joint aches Skin: no rashes, no hair loss Neurological: no tremors/no numbness/no tingling/no dizziness  I reviewed pt's medications, allergies, PMH, social hx, family hx, and changes were documented in the history of present illness. Otherwise, unchanged from my initial visit note.  Past Medical History:  Diagnosis Date  . Allergy   . Diabetes mellitus without complication [facility](HCC)    Past Surgical History:  Procedure Laterality Date  . DENTAL SURGERY     Social History   Socioeconomic History  . Marital status: Single    Spouse name: Not on file  . Number of children:  To: 13 and 17 10/2008  . Years of education: Not on file  . Highest education level: Not on file  Occupational History  .  Distribution receiver for [employer]  . Financial resource strain: Not on file  . Food insecurity:    Worry: Not on file    Inability: Not on file  . Transportation needs:    Medical: Not on file    Non-medical: Not on file  Tobacco Use  . Smoking status: Former Smoker    Types: Cigarettes  . Smokeless tobacco: Never Used  Substance and Sexual Activity  . Alcohol use:  no    Alcohol/week:    Types:  Marland Kitchen Drug  use: Not Currently  . Sexual activity: Not on file  Lifestyle  . Physical activity:    Days per week: Not on file    Minutes per session: Not on file  . Stress: Not on file  Relationships  . Social connections:    Talks on phone: Not on file    Gets together: Not on file    Attends religious service: Not on file    Active member of club or organization: Not on file    Attends meetings of clubs or organizations: Not on file    Relationship status: Not on file  . Intimate partner violence:    Fear of current or ex partner: Not on file    Emotionally abused: Not on file    Physically abused: Not on file    Forced sexual activity: Not on file  Other Topics Concern  . Not on file  Social History Narrative  . Not on file   Current Outpatient Medications on File Prior to Visit  Medication Sig Dispense Refill  . Continuous Blood Gluc Receiver (FREESTYLE LIBRE READER) DEVI 1 Device by Does not apply route 4 (four) times daily. 1 Device 0  . Continuous Blood Gluc Sensor (FREESTYLE LIBRE 14 DAY SENSOR) MISC 1 each by Does not apply route every 14 (fourteen) days. Change every 2 weeks 6 each 3  . Glucagon (BAQSIMI ONE PACK) 3 MG/DOSE POWD Place 3 mg into the nose once as needed for up to 1 dose. 1 each prn  . glucagon 1 MG injection Inject 1 mg into the muscle once as needed for up to 1 dose. 1 each prn  . Insulin Lispro (HUMALOG KWIKPEN) 200 UNIT/ML SOPN Inject 12-16 Units into the skin 3 (three) times daily before meals. 30 mL 5  . Insulin Pen Needle 31G X 5 MM MISC 1 Device by Does not apply route QID. 100 each 2   No current facility-administered medications on file prior to visit.    Allergies  Allergen Reactions  . Peanut-Containing Drug Products Other (See Comments)    Throat burning    Family History  Problem Relation Age of Onset  . Diabetes Mellitus II Mother   . Diabetes Mellitus II Father   . Diabetes Mellitus II Brother    PE: There were no vitals taken for this  visit. Wt Readings from Last 3 Encounters:  12/12/18 187 lb (84.8 kg)  10/31/18 174 lb (78.9 kg)  10/21/18 169 lb (76.7 kg)        Constitutional:  in NAD  The physical exam was not performed (telephone visit).  ASSESSMENT: 1. DM, insulin-dependent, uncontrolled, without long-term complications, but with DKA episodes  PLAN:  1. Patient with   longstanding, uncontrolled, insulin-dependent diabetes, with history of DKA in the setting  of not affording his insulins, but unclear if type I or type II.  He is on the basal-bolus insulin regimen, which was adjusted at last visit.  In the past, he was using a high dose of long-acting insulin and too little short-acting insulin.  We raised the doses of his Humalog at last visit.  He works nights, which complicates his management.  At last visit, he had high blood sugars after breakfast, after which sugars were decreasing during the day and stayed fairly stable until the next breakfast.  He usually goes to bed after he comes home but then wakes up to eat breakfast.  He was only bolusing 6 units with this meal and we increased this dose. -We will need to check him for type 1 diabetes.  His GAD antibodies were negative, but will need to also check his ICA and zinc transporter antibodies and also his insulin production by checking a C-peptide and fasting glucose.  However, these should be checked when his sugars are lower than 200.  If he has type 1 diabetes, we will need to start an insulin carb ratio.  He had carb counting training in the past. -At this visit, sugars appear improved from last visit.  We reviewed together his freestyle libre CGM traces and he still appears to have significant hyperglycemia after breakfast.  We discussed about increasing the insulin with this meal.  Later in the day, the changes are not as drastic, but he occasionally may have a lower blood sugar in the evening.  We discussed that these can be 20 points lower than his real blood  sugars so he needs to check with his glucometer if he has low readings from his CBGs.  For now, we will continue the current dose of Tresiba.  If the low blood sugars continue, we may need to decrease this.  - I suggested to:  Patient Instructions  Please continue: - Tresiba 40 units daily - Humalog 12-14 units before meals  Please return in 3-4 months with your sugar log.   - we will check his HbA1c when he returns to the clinic. - continue checking sugars at different times of the day - check 3-4x a day, rotating checks - advised for yearly eye exams >> he is UTD - Return to clinic in 3 mo with sugar log    Carlus Pavlovristina Dusten Ellinwood, MD PhD [facility]

## 2019-03-26 ENCOUNTER — Encounter: Payer: Self-pay | Admitting: Internal Medicine

## 2019-03-26 ENCOUNTER — Other Ambulatory Visit: Payer: Self-pay

## 2019-03-26 ENCOUNTER — Encounter: Payer: Managed Care, Other (non HMO) | Admitting: Internal Medicine

## 2019-12-29 ENCOUNTER — Other Ambulatory Visit: Payer: Self-pay

## 2019-12-30 ENCOUNTER — Encounter: Payer: Self-pay | Admitting: Nurse Practitioner

## 2019-12-30 ENCOUNTER — Ambulatory Visit (INDEPENDENT_AMBULATORY_CARE_PROVIDER_SITE_OTHER): Payer: Managed Care, Other (non HMO) | Admitting: Nurse Practitioner

## 2019-12-30 VITALS — BP 122/70 | HR 101 | Temp 96.7°F | Ht 75.0 in | Wt 178.8 lb

## 2019-12-30 DIAGNOSIS — L209 Atopic dermatitis, unspecified: Secondary | ICD-10-CM

## 2019-12-30 MED ORDER — TRIAMCINOLONE ACETONIDE 0.1 % EX OINT: 1.0000 "application " | TOPICAL_OINTMENT | Freq: Two times a day (BID) | CUTANEOUS | 0 refills | Status: DC

## 2019-12-30 MED ORDER — EUCERIN EX CREA: TOPICAL_CREAM | Freq: Two times a day (BID) | CUTANEOUS | 0 refills | Status: DC

## 2019-12-30 NOTE — Patient Instructions (Signed)
Use triamcinolone x1week, BID then stop Use Eucerin cream continuously.  Atopic Dermatitis Atopic dermatitis is a skin disorder that causes inflammation of the skin. This is the most common type of eczema. Eczema is a group of skin conditions that cause the skin to be itchy, red, and swollen. This condition is generally worse during the cooler winter months and often improves during the warm summer months. Symptoms can vary from person to person. Atopic dermatitis usually starts showing signs in infancy and can last through adulthood. This condition cannot be passed from one person to another (non-contagious), but it is more common in families. Atopic dermatitis may not always be present. When it is present, it is called a flare-up. What are the causes? The exact cause of this condition is not known. Flare-ups of the condition may be triggered by:  Contact with something that you are sensitive or allergic to.  Stress.  Certain foods.  Extremely hot or cold weather.  Harsh chemicals and soaps.  Dry air.  Chlorine. What increases the risk? This condition is more likely to develop in people who have a personal history or family history of eczema, allergies, asthma, or hay fever. What are the signs or symptoms? Symptoms of this condition include:  Dry, scaly skin.  Red, itchy rash.  Itchiness, which can be severe. This may occur before the skin rash. This can make sleeping difficult.  Skin thickening and cracking that can occur over time. How is this diagnosed? This condition is diagnosed based on your symptoms, a medical history, and a physical exam. How is this treated? There is no cure for this condition, but symptoms can usually be controlled. Treatment focuses on:  Controlling the itchiness and scratching. You may be given medicines, such as antihistamines or steroid creams.  Limiting exposure to things that you are sensitive or allergic to (allergens).  Recognizing  situations that cause stress and developing a plan to manage stress. If your atopic dermatitis does not get better with medicines, or if it is all over your body (widespread), a treatment using a specific type of light (phototherapy) may be used. Follow these instructions at home: Skin care   Keep your skin well-moisturized. Doing this seals in moisture and helps to prevent dryness. ? Use unscented lotions that have petroleum in them. ? Avoid lotions that contain alcohol or water. They can dry the skin.  Keep baths or showers short (less than 5 minutes) in warm water. Do not use hot water. ? Use mild, unscented cleansers for bathing. Avoid soap and bubble bath. ? Apply a moisturizer to your skin right after a bath or shower.  Do not apply anything to your skin without checking with your health care provider. General instructions  Dress in clothes made of cotton or cotton blends. Dress lightly because heat increases itchiness.  When washing your clothes, rinse your clothes twice so all of the soap is removed.  Avoid any triggers that can cause a flare-up.  Try to manage your stress.  Keep your fingernails cut short.  Avoid scratching. Scratching makes the rash and itchiness worse. It may also result in a skin infection (impetigo) due to a break in the skin caused by scratching.  Take or apply over-the-counter and prescription medicines only as told by your health care provider.  Keep all follow-up visits as told by your health care provider. This is important.  Do not be around people who have cold sores or fever blisters. If you get the infection,  it may cause your atopic dermatitis to worsen. Contact a health care provider if:  Your itchiness interferes with sleep.  Your rash gets worse or it is not better within one week of starting treatment.  You have a fever.  You have a rash flare-up after having contact with someone who has cold sores or fever blisters. Get help  right away if:  You develop pus or soft yellow scabs in the rash area. Summary  This condition causes a red rash and itchy, dry, scaly skin.  Treatment focuses on controlling the itchiness and scratching, limiting exposure to things that you are sensitive or allergic to (allergens), recognizing situations that cause stress, and developing a plan to manage stress.  Keep your skin well-moisturized.  Keep baths or showers shorter than 5 minutes and use warm water. Do not use hot water. This information is not intended to replace advice given to you by your health care provider. Make sure you discuss any questions you have with your health care provider. Document Revised: 01/21/2019 Document Reviewed: 11/03/2016 Elsevier Patient Education  Grandfather.

## 2019-12-30 NOTE — Progress Notes (Signed)
Subjective:  Patient ID: Edward Walls, male    DOB: [birth year]  Age: 39 y.o. MRN: [id number]  CC: Eczema (Pt stated he has really dry and scratchy skin mainly on sides, back, and shoulders//pt reported he had eczema as a child//pt tried OTC skin therapy but didn't help//Pt states started 2 or 3 weeks)  Rash This is a chronic problem. The current episode started more than 1 year ago. The problem has been waxing and waning since onset. The affected locations include the back and torso. The rash is characterized by dryness and itchiness. It is unknown if there was an exposure to a precipitant. Pertinent negatives include no fatigue, fever, rhinorrhea, shortness of breath or sore throat. Past treatments include moisturizer. The treatment provided mild relief. His past medical history is significant for eczema. There is no history of allergies, asthma or varicella.    Reviewed past Medical, Social and Family history today.  Outpatient Medications Prior to Visit  Medication Sig Dispense Refill  . Continuous Blood Gluc Receiver (FREESTYLE LIBRE READER) DEVI 1 Device by Does not apply route 4 (four) times daily. 1 Device 0  . Continuous Blood Gluc Sensor (FREESTYLE LIBRE 14 DAY SENSOR) MISC 1 each by Does not apply route every 14 (fourteen) days. Change every 2 weeks 6 each 3  . Glucagon (BAQSIMI ONE PACK) 3 MG/DOSE POWD Place 3 mg into the nose once as needed for up to 1 dose. 1 each prn  . glucagon 1 MG injection Inject 1 mg into the muscle once as needed for up to 1 dose. 1 each prn  . Insulin Lispro (HUMALOG KWIKPEN) 200 UNIT/ML SOPN Inject 12-16 Units into the skin 3 (three) times daily before meals. 30 mL 5  . Insulin Pen Needle 31G X 5 MM MISC 1 Device by Does not apply route QID. 100 each 2   No facility-administered medications prior to visit.    ROS See HPI  Objective:  BP 122/70   Pulse (!) 101   Temp (!) 96.7 F (35.9 C) (Tympanic)   Ht 6\' 3"  (1.905 m)   Wt 178 lb 12.8 oz  (81.1 kg)   SpO2 99%   BMI 22.35 kg/m   BP Readings from Last 3 Encounters:  12/30/19 122/70  12/12/18 120/70  10/31/18 120/70    Wt Readings from Last 3 Encounters:  12/30/19 178 lb 12.8 oz (81.1 kg)  12/12/18 187 lb (84.8 kg)  10/31/18 174 lb (78.9 kg)   Physical Exam Vitals reviewed.  Cardiovascular:     Rate and Rhythm: Normal rate.     Pulses: Normal pulses.  Pulmonary:     Effort: Pulmonary effort is normal.  Skin:    General: Skin is warm and dry.     Findings: Rash present. No erythema. Rash is macular.       Neurological:     Mental Status: He is alert and oriented to person, place, and time.    Lab Results  Component Value Date   WBC 5.4 10/16/2018   HGB 13.1 10/16/2018   HCT 41.3 10/16/2018   PLT 159 10/16/2018   GLUCOSE 140 (H) 12/12/2018   CHOL 76 12/12/2018   TRIG 32.0 12/12/2018   HDL 34.10 (L) 12/12/2018   LDLCALC 36 12/12/2018   ALT 13 12/12/2018   AST 15 12/12/2018   NA 140 12/12/2018   K 4.1 12/12/2018   CL 105 12/12/2018   CREATININE 0.96 12/12/2018   BUN 15 12/12/2018   CO2 29  12/12/2018   TSH 0.59 12/12/2018   HGBA1C >15.5 (H) 10/14/2018   MICROALBUR <0.7 12/12/2018   Assessment & Plan:  This visit occurred during the SARS-CoV-2 public health emergency.  Safety protocols were in place, including screening questions prior to the visit, additional usage of staff PPE, and extensive cleaning of exam room while observing appropriate contact time as indicated for disinfecting solutions.   Marv was seen today for eczema.  Diagnoses and all orders for this visit:  Atopic dermatitis, unspecified type -     triamcinolone ointment (KENALOG) 0.1 %; Apply 1 application topically 2 (two) times daily. -     Skin Protectants, Misc. (EUCERIN) cream; Apply topically in the morning and at bedtime.   I am having Maddoxx A. Pask start on triamcinolone ointment and eucerin. I am also having him maintain his Insulin Pen Needle, glucagon,  Glucagon, FreeStyle Libre 14 Day Sensor, insulin lispro, and FreeStyle [employer].  Meds ordered this encounter  Medications  . triamcinolone ointment (KENALOG) 0.1 %    Sig: Apply 1 application topically 2 (two) times daily.    Dispense:  453.6 g    Refill:  0    Order Specific Question:   Supervising Provider    Answer:   Overton Mam [[id number]]  . Skin Protectants, Misc. (EUCERIN) cream    Sig: Apply topically in the morning and at bedtime.    Dispense:  454 g    Refill:  0    Order Specific Question:   Supervising Provider    Answer:   Overton Mam [[id number]]    Problem List Items Addressed This Visit    None    Visit Diagnoses    Atopic dermatitis, unspecified type    -  Primary   Relevant Medications   triamcinolone ointment (KENALOG) 0.1 %   Skin Protectants, Misc. (EUCERIN) cream       Follow-up: No follow-ups on file.  Alysia Penna, NP

## 2020-01-01 ENCOUNTER — Other Ambulatory Visit: Payer: Self-pay | Admitting: Internal Medicine

## 2020-01-02 ENCOUNTER — Telehealth: Payer: Self-pay | Admitting: Internal Medicine

## 2020-01-02 NOTE — Telephone Encounter (Signed)
Per Dr. Elvera Lennox:   Edward Pavlov, MD to Me     3:22 PM Note   Pt was lost for f/u for me, I have not seen him in more than a year.  Further refills per PCP.

## 2020-01-02 NOTE — Telephone Encounter (Signed)
Last office visit 12/12/2018  Cancel/No-show? 0  Future office visit scheduled? no  Please advise on refill.

## 2020-01-02 NOTE — Telephone Encounter (Signed)
Patient's PHARM told patient they sent a request for the following Rx 01/01/20 but have had no response. Patient requests the following:  MEDICATION: Insulin Lispro (HUMALOG KWIKPEN) 200 UNIT/ML SOPN  PHARMACY:   Karin Golden at [facility] [address], [place] - [address] Phone:  [phone]  Fax:  [phone]      IS THIS A 90 DAY SUPPLY : Yes if possible  IS PATIENT OUT OF MEDICATION: Yes  IF NOT; HOW MUCH IS LEFT: 0  LAST APPOINTMENT DATE: @3 /18/2021  NEXT APPOINTMENT DATE:@Visit  date not found  DO WE HAVE YOUR PERMISSION TO LEAVE A DETAILED MESSAGE: Yes  OTHER COMMENTS:    **Let patient know to contact pharmacy at the end of the day to make sure medication is ready. **  ** Please notify patient to allow 48-72 hours to process**  **Encourage patient to contact the pharmacy for refills or they can request refills through [facility]**

## 2020-01-02 NOTE — Telephone Encounter (Signed)
Pt was lost for f/u for me, I have not seen him in more than a year.  Further refills per PCP.

## 2020-01-19 ENCOUNTER — Other Ambulatory Visit: Payer: Self-pay

## 2020-01-20 ENCOUNTER — Telehealth: Payer: Self-pay | Admitting: General Practice

## 2020-01-20 ENCOUNTER — Encounter: Payer: Managed Care, Other (non HMO) | Admitting: Family Medicine

## 2020-01-20 DIAGNOSIS — E139 Other specified diabetes mellitus without complications: Secondary | ICD-10-CM

## 2020-01-20 MED ORDER — HUMALOG KWIKPEN 200 UNIT/ML ~~LOC~~ SOPN: 12.0000 [IU] | PEN_INJECTOR | Freq: Three times a day (TID) | SUBCUTANEOUS | 5 refills | Status: DC

## 2020-01-20 NOTE — Telephone Encounter (Signed)
Patient states he has been out of Humalog for two weeks and the pharmacy needs authorization for refill.

## 2020-01-20 NOTE — Telephone Encounter (Signed)
Patient aware that Rx sent in  °

## 2020-01-27 ENCOUNTER — Other Ambulatory Visit: Payer: Self-pay

## 2020-01-28 ENCOUNTER — Ambulatory Visit (INDEPENDENT_AMBULATORY_CARE_PROVIDER_SITE_OTHER): Payer: Managed Care, Other (non HMO) | Admitting: Family Medicine

## 2020-01-28 ENCOUNTER — Encounter: Payer: Self-pay | Admitting: Family Medicine

## 2020-01-28 VITALS — BP 114/64 | HR 90 | Temp 97.3°F | Ht 75.0 in | Wt 179.8 lb

## 2020-01-28 DIAGNOSIS — E139 Other specified diabetes mellitus without complications: Secondary | ICD-10-CM

## 2020-01-28 DIAGNOSIS — B351 Tinea unguium: Secondary | ICD-10-CM | POA: Diagnosis not present

## 2020-01-28 LAB — CBC
HCT: 37.8 % — ABNORMAL LOW (ref 39.0–52.0)
Hemoglobin: 12.3 g/dL — ABNORMAL LOW (ref 13.0–17.0)
MCHC: 32.5 g/dL (ref 30.0–36.0)
MCV: 85.4 fl (ref 78.0–100.0)
Platelets: 218 10*3/uL (ref 150.0–400.0)
RBC: 4.43 Mil/uL (ref 4.22–5.81)
RDW: 12.9 % (ref 11.5–15.5)
WBC: 4.4 10*3/uL (ref 4.0–10.5)

## 2020-01-28 LAB — URINALYSIS, ROUTINE W REFLEX MICROSCOPIC
Bilirubin Urine: NEGATIVE
Hgb urine dipstick: NEGATIVE
Ketones, ur: 15 — AB
Leukocytes,Ua: NEGATIVE
Nitrite: NEGATIVE
RBC / HPF: NONE SEEN (ref 0–?)
Specific Gravity, Urine: 1.01 (ref 1.000–1.030)
Total Protein, Urine: NEGATIVE
Urine Glucose: >=1000 — AB
Urobilinogen, UA: 0.2 (ref 0.0–1.0)
pH: 6 (ref 5.0–8.0)

## 2020-01-28 LAB — LIPID PANEL
Cholesterol: 118 mg/dL (ref 0–200)
HDL: 30.6 mg/dL — ABNORMAL LOW (ref >39.00)
LDL Cholesterol: 75 mg/dL (ref 0–99)
NonHDL: 87.56
Total CHOL/HDL Ratio: 4
Triglycerides: 65 mg/dL (ref 0.0–149.0)
VLDL: 13 mg/dL (ref 0.0–40.0)

## 2020-01-28 LAB — COMPREHENSIVE METABOLIC PANEL
ALT: 16 U/L (ref 0–53)
AST: 17 U/L (ref 0–37)
Albumin: 4.2 g/dL (ref 3.5–5.2)
Alkaline Phosphatase: 58 U/L (ref 39–117)
BUN: 15 mg/dL (ref 6–23)
CO2: 27 mEq/L (ref 19–32)
Calcium: 9.3 mg/dL (ref 8.4–10.5)
Chloride: 97 mEq/L (ref 96–112)
Creatinine, Ser: 0.98 mg/dL (ref 0.40–1.50)
GFR: 104.08 mL/min (ref >60.00)
Glucose, Bld: 335 mg/dL — ABNORMAL HIGH (ref 70–99)
Potassium: 3.8 mEq/L (ref 3.5–5.1)
Sodium: 135 mEq/L (ref 135–145)
Total Bilirubin: 1.1 mg/dL (ref 0.2–1.2)
Total Protein: 6.2 g/dL (ref 6.0–8.3)

## 2020-01-28 LAB — HEMOGLOBIN A1C: Hgb A1c MFr Bld: 17.2 % — ABNORMAL HIGH (ref 4.6–6.5)

## 2020-01-28 LAB — MICROALBUMIN / CREATININE URINE RATIO
Creatinine,U: 52.9 mg/dL
Microalb Creat Ratio: 1.3 mg/g (ref 0.0–30.0)
Microalb, Ur: <0.7 mg/dL (ref 0.0–1.9)

## 2020-01-28 MED ORDER — TERBINAFINE HCL 250 MG PO TABS: 250.0000 mg | ORAL_TABLET | Freq: Every day | ORAL | 1 refills | Status: DC

## 2020-01-28 NOTE — Progress Notes (Signed)
Established Patient Office Visit  Subjective:  Patient ID: Edward Walls, male    DOB: [birth year]  Age: 39 y.o. MRN: [id number]  CC:  Chief Complaint  Patient presents with  . Transitions Of Care    TOC former patient of Dr. Zigmund Daniel, no concerns     HPI Edward Walls presents for follow-up of his diabetes.  He is doing well is far as he knows with the diabetes and his insulin pump.  He has not had follow-up with the endocrinologist or seen an ophthalmologist yet.  Continues to work in food distribution.  Past Medical History:  Diagnosis Date  . Allergy   . Diabetes mellitus without complication [facility])     Past Surgical History:  Procedure Laterality Date  . DENTAL SURGERY      Family History  Problem Relation Age of Onset  . Diabetes Mellitus II Mother   . Diabetes Mellitus II Father   . Diabetes Mellitus II Brother     Social History   Socioeconomic History  . Marital status: Single    Spouse name: Not on file  . Number of children: Not on file  . Years of education: Not on file  . Highest education level: Not on file  Occupational History  . Not on file  Tobacco Use  . Smoking status: Former Smoker    Types: Cigarettes  . Smokeless tobacco: Never Used  Substance and Sexual Activity  . Alcohol use: Yes    Alcohol/week: 9.0 standard drinks    Types: 1 Cans of beer, 8 Shots of liquor per week  . Drug use: Not Currently  . Sexual activity: Not on file  Other Topics Concern  . Not on file  Social History Narrative  . Not on file   Social Determinants of Health   Financial Resource Strain:   . Difficulty of Paying Living Expenses:   Food Insecurity:   . Worried About Charity fundraiser in the Last Year:   . Arboriculturist in the Last Year:   Transportation Needs:   . Film/video editor (Medical):   Marland Kitchen Lack of Transportation (Non-Medical):   Physical Activity:   . Days of Exercise per Week:   . Minutes of Exercise per Session:   Stress:    . Feeling of Stress :   Social Connections:   . Frequency of Communication with Friends and Family:   . Frequency of Social Gatherings with Friends and Family:   . Attends Religious Services:   . Active Member of Clubs or Organizations:   . Attends Archivist Meetings:   Marland Kitchen Marital Status:   Intimate Partner Violence:   . Fear of Current or Ex-Partner:   . Emotionally Abused:   Marland Kitchen Physically Abused:   . Sexually Abused:     Outpatient Medications Prior to Visit  Medication Sig Dispense Refill  . Continuous Blood Gluc Receiver (FREESTYLE LIBRE READER) DEVI 1 Device by Does not apply route 4 (four) times daily. 1 Device 0  . Continuous Blood Gluc Sensor (FREESTYLE LIBRE 14 DAY SENSOR) MISC 1 each by Does not apply route every 14 (fourteen) days. Change every 2 weeks 6 each 3  . insulin lispro (HUMALOG KWIKPEN) 200 UNIT/ML KwikPen Inject 12-16 Units into the skin 3 (three) times daily before meals. 30 mL 5  . Insulin Pen Needle 31G X 5 MM MISC 1 Device by Does not apply route QID. 100 each 2  . Skin Protectants, Misc. (  EUCERIN) cream Apply topically in the morning and at bedtime. 454 g 0  . triamcinolone ointment (KENALOG) 0.1 % Apply 1 application topically 2 (two) times daily. 453.6 g 0  . Glucagon (BAQSIMI ONE PACK) 3 MG/DOSE POWD Place 3 mg into the nose once as needed for up to 1 dose. (Patient not taking: Reported on 01/28/2020) 1 each prn  . glucagon 1 MG injection Inject 1 mg into the muscle once as needed for up to 1 dose. (Patient not taking: Reported on 01/28/2020) 1 each prn   No facility-administered medications prior to visit.    Allergies  Allergen Reactions  . Peanut-Containing Drug Products Other (See Comments)    Throat burning     ROS Review of Systems  Constitutional: Negative.   HENT: Negative.   Eyes: Negative for photophobia.  Respiratory: Negative.   Cardiovascular: Negative.   Gastrointestinal: Negative.   Endocrine: Negative for polyphagia and  polyuria.  Genitourinary: Negative.   Musculoskeletal: Negative for gait problem and joint swelling.  Neurological: Negative for weakness and numbness.  Hematological: Negative.   Psychiatric/Behavioral: Negative.       Objective:    Physical Exam  Constitutional: He is oriented to person, place, and time. He appears well-developed and well-nourished. No distress.  HENT:  Head: Normocephalic and atraumatic.  Right Ear: External ear normal.  Left Ear: External ear normal.  Eyes: Conjunctivae are normal. Right eye exhibits no discharge. Left eye exhibits no discharge. No scleral icterus.  Neck: No JVD present. No tracheal deviation present. No thyromegaly present.  Cardiovascular: Normal rate, regular rhythm and normal heart sounds.  Pulses:      Dorsalis pedis pulses are 2+ on the right side and 2+ on the left side.       Posterior tibial pulses are 2+ on the right side and 2+ on the left side.  Pulmonary/Chest: Effort normal and breath sounds normal. No stridor.  Musculoskeletal:        General: No edema.     Cervical back: Neck supple.  Lymphadenopathy:    He has no cervical adenopathy.  Neurological: He is alert and oriented to person, place, and time.  Skin: Skin is warm and dry. He is not diaphoretic.  Psychiatric: He has a normal mood and affect. His behavior is normal.     Diabetic Foot Exam - Simple   Simple Foot Form Diabetic Foot exam was performed with the following findings: Yes 01/28/2020 11:03 AM  Visual Inspection See comments: Yes Sensation Testing Intact to touch and monofilament testing bilaterally: Yes Pulse Check Posterior Tibialis and Dorsalis pulse intact bilaterally: Yes Comments Great toe nails are thick and oncotic.     BP 114/64   Pulse 90   Temp (!) 97.3 F (36.3 C) (Tympanic)   Ht 6\' 3"  (1.905 m)   Wt 179 lb 12.8 oz (81.6 kg)   SpO2 97%   BMI 22.47 kg/m  Wt Readings from Last 3 Encounters:  01/28/20 179 lb 12.8 oz (81.6 kg)  12/30/19  178 lb 12.8 oz (81.1 kg)  12/12/18 187 lb (84.8 kg)     Health Maintenance Due  Topic Date Due  . OPHTHALMOLOGY EXAM  Never done  . HEMOGLOBIN A1C  04/15/2019  . URINE MICROALBUMIN  12/13/2019    There are no preventive care reminders to display for this patient.  Lab Results  Component Value Date   TSH 0.59 12/12/2018   Lab Results  Component Value Date   WBC 5.4 10/16/2018  HGB 13.1 10/16/2018   HCT 41.3 10/16/2018   MCV 86.9 10/16/2018   PLT 159 10/16/2018   Lab Results  Component Value Date   NA 140 12/12/2018   K 4.1 12/12/2018   CO2 29 12/12/2018   GLUCOSE 140 (H) 12/12/2018   BUN 15 12/12/2018   CREATININE 0.96 12/12/2018   BILITOT 0.8 12/12/2018   ALKPHOS 79 10/14/2018   AST 15 12/12/2018   ALT 13 12/12/2018   PROT 6.1 12/12/2018   ALBUMIN 5.1 (H) 10/14/2018   CALCIUM 9.2 12/12/2018   ANIONGAP 8 10/17/2018   GFR 124.50 10/21/2018   Lab Results  Component Value Date   CHOL 76 12/12/2018   Lab Results  Component Value Date   HDL 34.10 (L) 12/12/2018   Lab Results  Component Value Date   LDLCALC 36 12/12/2018   Lab Results  Component Value Date   TRIG 32.0 12/12/2018   Lab Results  Component Value Date   CHOLHDL 2 12/12/2018   Lab Results  Component Value Date   HGBA1C >15.5 (H) 10/14/2018      Assessment & Plan:   Problem List Items Addressed This Visit      Endocrine   Diabetes mellitus type 1.5, 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 terbinafine (LAMISIL) 250 MG tablet    Sig: Take 1 tablet (250 mg total) by mouth  daily.    Dispense:  30 tablet    Refill:  1    Follow-up: Return in about 1 month (around 02/27/2020), or Return in one month for CMP only. not to see doctor..   We discussed the importance of foot checks as well as seeing ophthalmologist yearly.  He is interested in trying Lamisil.  He knows that he must discontinue alcohol while taking the Lamisil.  He will return in 1 month for follow-up CMP.  He understands that we will take 3 months of medicine and up to 6 months for his new nails to come in.  Urged to follow-up with endocrinology as well. Mliss Sax, MD

## 2020-01-28 NOTE — Patient Instructions (Signed)
Fungal Nail Infection A fungal nail infection is a common infection of the toenails or fingernails. This condition affects toenails more often than fingernails. It often affects the great, or big, toes. More than one nail may be infected. The condition can be passed from person to person (is contagious). What are the causes? This condition is caused by a fungus. Several types of fungi can cause the infection. These fungi are common in moist and warm areas. If your hands or feet come into contact with the fungus, it may get into a crack in your fingernail or toenail and cause the infection. What increases the risk? The following factors may make you more likely to develop this condition:  Being male.  Being of older age.  Living with someone who has the fungus.  Walking barefoot in areas where the fungus thrives, such as showers or locker rooms.  Wearing shoes and socks that cause your feet to sweat.  Having a nail injury or a recent nail surgery.  Having certain medical conditions, such as: ? Athlete's foot. ? Diabetes. ? Psoriasis. ? Poor circulation. ? A weak body defense system (immune system). What are the signs or symptoms? Symptoms of this condition include:  A pale spot on the nail.  Thickening of the nail.  A nail that becomes yellow or brown.  A brittle or ragged nail edge.  A crumbling nail.  A nail that has lifted away from the nail bed. How is this diagnosed? This condition is diagnosed with a physical exam. Your health care provider may take a scraping or clipping from your nail to test for the fungus. How is this treated? Treatment is not needed for mild infections. If you have significant nail changes, treatment may include:  Antifungal medicines taken by mouth (orally). You may need to take the medicine for several weeks or several months, and you may not see the results for a long time. These medicines can cause side effects. Ask your health care provider  what problems to watch for.  Antifungal nail polish or nail cream. These may be used along with oral antifungal medicines.  Laser treatment of the nail.  Surgery to remove the nail. This may be needed for the most severe infections. It can take a long time, usually up to a year, for the infection to go away. The infection may also come back. Follow these instructions at home: Medicines  Take or apply over-the-counter and prescription medicines only as told by your health care provider.  Ask your health care provider about using over-the-counter mentholated ointment on your nails. Nail care  Trim your nails often.  Wash and dry your hands and feet every day.  Keep your feet dry: ? Wear absorbent socks, and change your socks frequently. ? Wear shoes that allow air to circulate, such as sandals or canvas tennis shoes. Throw out old shoes.  Do not use artificial nails.  If you go to a nail salon, make sure you choose one that uses clean instruments.  Use antifungal foot powder on your feet and in your shoes. General instructions  Do not share personal items, such as towels or nail clippers.  Do not walk barefoot in shower rooms or locker rooms.  Wear rubber gloves if you are working with your hands in wet areas.  Keep all follow-up visits as told by your health care provider. This is important. Contact a health care provider if: Your infection is not getting better or it is getting worse   after several months. Summary  A fungal nail infection is a common infection of the toenails or fingernails.  Treatment is not needed for mild infections. If you have significant nail changes, treatment may include taking medicine orally and applying medicine to your nails.  It can take a long time, usually up to a year, for the infection to go away. The infection may also come back.  Take or apply over-the-counter and prescription medicines only as told by your health care  provider.  Follow instructions for taking care of your nails to help prevent infection from coming back or spreading. This information is not intended to replace advice given to you by your health care provider. Make sure you discuss any questions you have with your health care provider. Document Revised: 01/23/2019 Document Reviewed: 03/08/2018 Elsevier Patient Education  [place]. Terbinafine tablets What is this medicine? TERBINAFINE (TER bin a feen) is an antifungal medicine. It is used to treat certain kinds of fungal or yeast infections. This medicine may be used for other purposes; ask your health care provider or pharmacist if you have questions. COMMON BRAND NAME(S): Lamisil, Terbinex What should I tell my health care provider before I take this medicine? They need to know if you have any of these conditions:  drink alcoholic beverages  kidney disease  liver disease  an unusual or allergic reaction to terbinafine, other medicines, foods, dyes, or preservatives  pregnant or trying to get pregnant  breast-feeding How should I use this medicine? Take this medicine by mouth with a full glass of water. Follow the directions on the prescription label. You can take this medicine with food or on an empty stomach. Take your medicine at regular intervals. Do not take your medicine more often than directed. Do not skip doses or stop your medicine early even if you feel better. Do not stop taking except on your doctor's advice. Talk to your pediatrician regarding the use of this medicine in children. Special care may be needed. Overdosage: If you think you have taken too much of this medicine contact a poison control center or emergency room at once. NOTE: This medicine is only for you. Do not share this medicine with others. What if I miss a dose? If you miss a dose, take it as soon as you can. If it is almost time for your next dose, take only that dose. Do not take double or  extra doses. What may interact with this medicine? Do not take this medicine with any of the following medications:  thioridazine This medicine may also interact with the following medications:  beta-blockers  caffeine  cimetidine  cyclosporine  medicines for depression, anxiety, or psychotic disturbances  medicines for fungal infections like fluconazole and ketoconazole  medicines for irregular heartbeat like amiodarone, flecainide and propafenone  rifampin  warfarin This list may not describe all possible interactions. Give your health care provider a list of all the medicines, herbs, non-prescription drugs, or dietary supplements you use. Also tell them if you smoke, drink alcohol, or use illegal drugs. Some items may interact with your medicine. What should I watch for while using this medicine? Visit your doctor or health care provider regularly. Tell your doctor right away if you have nausea or vomiting, loss of appetite, stomach pain on your right upper side, yellow skin, dark urine, light stools, or are over tired. Some fungal infections need many weeks or months of treatment to cure. If you are taking this medicine for a  long time, you will need to have important blood work done. This medicine may cause serious skin reactions. They can happen weeks to months after starting the medicine. Contact your health care provider right away if you notice fevers or flu-like symptoms with a rash. The rash may be red or purple and then turn into blisters or peeling of the skin. Or, you might notice a red rash with swelling of the face, lips or lymph nodes in your neck or under your arms. What side effects may I notice from receiving this medicine? Side effects that you should report to your doctor or health care professional as soon as possible:  allergic reactions like skin rash or hives, swelling of the face, lips, or tongue  changes in vision  dark urine  fever or  infection  general ill feeling or flu-like symptoms  light-colored stools  loss of appetite, nausea  rash, fever, and swollen lymph nodes  redness, blistering, peeling or loosening of the skin, including inside the mouth  right upper belly pain  unusually weak or tired  yellowing of the eyes or skin Side effects that usually do not require medical attention (report to your doctor or health care professional if they continue or are bothersome):  changes in taste  diarrhea  hair loss  muscle or joint pain  stomach gas  stomach upset This list may not describe all possible side effects. Call your doctor for medical advice about side effects. You may report side effects to FDA at 1-800-FDA-1088. Where should I keep my medicine? Keep out of the reach of children. Store at room temperature below 25 degrees C (77 degrees F). Protect from light. Throw away any unused medicine after the expiration date. NOTE: This sheet is a summary. It may not cover all possible information. If you have questions about this medicine, talk to your doctor, pharmacist, or health care provider.  2020 Elsevier/Gold Standard (2019-01-10 15:37:07)

## 2020-03-01 ENCOUNTER — Other Ambulatory Visit: Payer: Managed Care, Other (non HMO)

## 2020-03-18 ENCOUNTER — Other Ambulatory Visit: Payer: Self-pay

## 2020-03-23 ENCOUNTER — Telehealth: Payer: Self-pay | Admitting: Internal Medicine

## 2020-03-23 ENCOUNTER — Other Ambulatory Visit: Payer: Self-pay

## 2020-03-23 ENCOUNTER — Ambulatory Visit: Payer: Managed Care, Other (non HMO) | Admitting: Internal Medicine

## 2020-03-23 ENCOUNTER — Encounter: Payer: Self-pay | Admitting: Internal Medicine

## 2020-03-23 VITALS — BP 120/70 | HR 77 | Ht 75.0 in | Wt 182.0 lb

## 2020-03-23 DIAGNOSIS — E785 Hyperlipidemia, unspecified: Secondary | ICD-10-CM | POA: Insufficient documentation

## 2020-03-23 DIAGNOSIS — E1010 Type 1 diabetes mellitus with ketoacidosis without coma: Secondary | ICD-10-CM | POA: Diagnosis not present

## 2020-03-23 MED ORDER — FREESTYLE LIBRE 2 SENSOR MISC: 1.0000 | 3 refills | Status: DC

## 2020-03-23 MED ORDER — FREESTYLE LIBRE 14 DAY SENSOR MISC: 2 refills | Status: DC

## 2020-03-23 MED ORDER — TRESIBA FLEXTOUCH 200 UNIT/ML ~~LOC~~ SOPN: 40.0000 [IU] | PEN_INJECTOR | Freq: Every day | SUBCUTANEOUS | 3 refills | Status: DC

## 2020-03-23 MED ORDER — INSULIN PEN NEEDLE 32G X 4 MM MISC: 3 refills | Status: DC

## 2020-03-23 MED ORDER — BAQSIMI ONE PACK 3 MG/DOSE NA POWD: 3.0000 mg | Freq: Once | NASAL | 99 refills | Status: DC | PRN

## 2020-03-23 MED ORDER — FREESTYLE LIBRE 2 READER DEVI: 1.0000 | Freq: Every day | 0 refills | Status: DC

## 2020-03-23 MED ORDER — LYUMJEV KWIKPEN 100 UNIT/ML ~~LOC~~ SOPN: PEN_INJECTOR | SUBCUTANEOUS | 11 refills | Status: DC

## 2020-03-23 NOTE — Patient Instructions (Addendum)
Please continue: - Tresiba 40 units daily (start at 26 units) (in abdomen)  Change: - Humalog >> Lyumjev  12-14 units before meals (in arms or legs)  Restart the new CGM.  Please call and schedule an eye appt with Dr. Randon Goldsmith: [facility]:  Dr. Jeni Salles MD ?  Address: [address] [place], [place], [place] [address]  [phone]  Please return in 1.5 months.

## 2020-03-23 NOTE — Telephone Encounter (Signed)
RX changed and sent. 

## 2020-03-23 NOTE — Progress Notes (Signed)
Patient ID: Edward Walls, male   DOB: 10/01/1981, 39 y.o.   MRN: [id number]   This visit occurred during the SARS-CoV-2 public health emergency.  Safety protocols were in place, including screening questions prior to the visit, additional usage of staff PPE, and extensive cleaning of exam room while observing appropriate contact time as indicated for disinfecting solutions.   HPI: Edward Walls is a 39 y.o.-year-old male, returning for follow-up for DM1, dx in 2017, insulin-dependent, uncontrolled, with complications (recurrent DKA episodes).  Last visit 1 year and 4 months ago.  Since last OV, he relaxed his diet, stopped going to the gym, missing insulin doses.  He was also not checking sugars consistently after he came off the CGM... An HbA1c from 01/2020 returned to go higher than before, at 17.2%.  Reviewed HbA1c levels: Lab Results  Component Value Date   HGBA1C 17.2 (H) 01/28/2020   HGBA1C >15.5 (H) 10/14/2018   HGBA1C 15.9 (H) 03/14/2018   HGBA1C 14.5 (H) 07/04/2017   HGBA1C 12.0 (H) 10/20/2016  07/05/2017: GAD Ab <5  Before last visit he was on: - Tresiba U200 30 >> 40 units at 11-12 pm - Humalog 6 >> 9-12 units 3x a day, before meals He had problems affording insulins in the past, but [place] and Humalog are covered.  We changed to: - Tresiba 40 units daily - stopped several mo ago!! - Humalog  12-14 units before meals - missing doses  At last visit investigation for type 1 diabetes was positive: Component     Latest Ref Rng & Units 12/12/2018  Glucose, Plasma     65 - 99 mg/dL 144 (H)  C-Peptide     0.80 - 3.85 ng/mL 0.52 (L)  Islet Cell Ab     Neg:<1:1 Negative  ZNT8 Antibodies     U/mL <15   He was checking his sugars more than 4 times a day with his libre CGM, now off 2/2 irritation 2/2 adhesive. He would like to get back on this.  He is not checking sugars now. Last check last week: - am: (end of his day): 280 - 2h after b'fast: n/c - lunch: n/c - 2h  after lunch: n/c - dinner: (waking up): n/c - 2h after dinner: n/c - middle of the night: 200s  Meter: Freestyle   Previous Freestyle libre CGM parameters: - average: 244 +/-  95.2 >> 160 - Active CGM time: 84% of the time - Glucose variability 31.8% (target less than or equal to 36%) - time in range:  - very low (<54): 1% - low (64-69): 3% - normal range (70-180): 67% - high sugars (181-250): 24% - very high sugars (>250): 5%  Previously:  Lowest sugar was 60 (Libre CGM) >> 53 >> ?; he has hypoglycemia awareness in the 80s. Highest sugar was 454 >> 300s x1 - milk shake >> 200s. He had 1 previous DKA episode when he could not afford his insulin in the past.  Glucometer: Freestyle Libre  Pt's meals are: - Breakfast: oatmeal + banana - Lunch: meatloaf, chilli, salad - Dinner: meat + veggies - Snacks: 8 pm  - crackers, at work >> works nights Stop desserts after his hospitalization.  No CKD, last BUN/creatinine:  Lab Results  Component Value Date   BUN 15 01/28/2020   BUN 15 12/12/2018   CREATININE 0.98 01/28/2020   CREATININE 0.96 12/12/2018   + Dyslipidemia (low HDL): Lab Results  Component Value Date   CHOL 118 01/28/2020  HDL 30.60 (L) 01/28/2020   LDLCALC 75 01/28/2020   TRIG 65.0 01/28/2020   CHOLHDL 4 01/28/2020   -No previous eye exams  -He denies numbness and tingling in his feet.  Pt has FH of DM in M, F, all 3 of his brothers - all have DM2.  ROS: Constitutional: no weight gain/no weight loss, no fatigue, no subjective hyperthermia, no subjective hypothermia Eyes: no blurry vision, no xerophthalmia ENT: no sore throat, no nodules palpated in neck, no dysphagia, no odynophagia, no hoarseness Cardiovascular: no CP/no SOB/no palpitations/no leg swelling Respiratory: no cough/no SOB/no wheezing Gastrointestinal: no N/no V/no D/no C/no acid reflux Musculoskeletal: no muscle aches/no joint aches Skin: no rashes, no hair loss Neurological: no  tremors/no numbness/no tingling/no dizziness  I reviewed pt's medications, allergies, PMH, social hx, family hx, and changes were documented in the history of present illness. Otherwise, unchanged from my initial visit note.  Past Medical History:  Diagnosis Date  . Allergy   . Diabetes mellitus without complication [facility])    Past Surgical History:  Procedure Laterality Date  . DENTAL SURGERY     Social History   Socioeconomic History  . Marital status: Single    Spouse name: Not on file  . Number of children:  To: 13 and 17 10/2008  . Years of education: Not on file  . Highest education level: Not on file  Occupational History  .  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 Financial resource strain: Not on file  . Food insecurity:    Worry: Not on file    Inability: Not on file  . Transportation needs:    Medical: Not on file    Non-medical: Not on file  Tobacco Use  . Smoking status: Former Smoker    Types: Cigarettes  . Smokeless tobacco: Never Used  Substance and Sexual Activity  . Alcohol use:  no    Alcohol/week:    Types:  Marland Kitchen Drug use: Not Currently  . Sexual activity: Not on file  Lifestyle  . Physical activity:    Days per week: Not on file    Minutes per session: Not on file  . Stress: Not on file  Relationships  . Social connections:    Talks on phone: Not on file    Gets together: Not on file    Attends religious service: Not on file    Active member of club or organization: Not on file    Attends meetings of clubs or organizations: Not on file    Relationship status: Not on file  . Intimate partner violence:    Fear of current or ex partner: Not on file    Emotionally abused: Not on file    Physically abused: Not on file    Forced sexual activity: Not on file  Other Topics Concern  . Not on file  Social History Narrative  . Not on file   Current Outpatient Medications on File Prior to Visit  Medication Sig Dispense Refill  .  Continuous Blood Gluc Receiver (FREESTYLE LIBRE READER) DEVI 1 Device by Does not apply route 4 (four) times daily. 1 Device 0  . Continuous Blood Gluc Sensor (FREESTYLE LIBRE 14 DAY SENSOR) MISC 1 each by Does not apply route every 14 (fourteen) days. Change every 2 weeks 6 each 3  . Glucagon (BAQSIMI ONE PACK) 3 MG/DOSE POWD Place 3 mg into the nose once as needed for up to 1 dose. (Patient not taking: Reported on  01/28/2020) 1 each prn  . glucagon 1 MG injection Inject 1 mg into the muscle once as needed for up to 1 dose. (Patient not taking: Reported on 01/28/2020) 1 each prn  . insulin lispro (HUMALOG KWIKPEN) 200 UNIT/ML KwikPen Inject 12-16 Units into the skin 3 (three) times daily before meals. 30 mL 5  . Insulin Pen Needle 31G X 5 MM MISC 1 Device by Does not apply route QID. 100 each 2  . Skin Protectants, Misc. (EUCERIN) cream Apply topically in the morning and at bedtime. 454 g 0  . terbinafine (LAMISIL) 250 MG tablet Take 1 tablet (250 mg total) by mouth daily. 30 tablet 1  . triamcinolone ointment (KENALOG) 0.1 % Apply 1 application topically 2 (two) times daily. 453.6 g 0   No current facility-administered medications on file prior to visit.   Allergies  Allergen Reactions  . Peanut-Containing Drug Products Other (See Comments)    Throat burning    Family History  Problem Relation Age of Onset  . Diabetes Mellitus II Mother   . Diabetes Mellitus II Father   . Diabetes Mellitus II Brother    PE: BP 120/70   Pulse 77   Ht 6\' 3"  (1.905 m)   Wt 182 lb (82.6 kg)   SpO2 98%   BMI 22.75 kg/m  Wt Readings from Last 3 Encounters:  03/23/20 182 lb (82.6 kg)  01/28/20 179 lb 12.8 oz (81.6 kg)  12/30/19 178 lb 12.8 oz (81.1 kg)        Constitutional: normal weight, in NAD Eyes: PERRLA, EOMI, no exophthalmos ENT: moist mucous membranes, no thyromegaly, no cervical lymphadenopathy Cardiovascular: RRR, No MRG Respiratory: CTA B Gastrointestinal: abdomen soft, NT, ND,  BS+ Musculoskeletal: no deformities, strength intact in all 4 Skin: moist, warm, no rashes Neurological: no tremor with outstretched hands, DTR normal in all 4  ASSESSMENT: 1. DM1, insulin-dependent, uncontrolled, without long-term complications, but with DKA episodes  2.  Dyslipidemia  PLAN:  1. Patient with longstanding, uncontrolled, insulin-dependent diabetes, diagnosed as type I at last visit, with history of DKA in the setting of not affording his insulin, returning after long absence of more than a year.  At last visit, she had insulin deficiency so we discussed that he will absolutely need to stay on his insulin doses.  At that time, we adjusted the dose of Tresiba and Humalog but he did not return for follow-up afterwards.  A recent HbA1c obtained 2 months ago was very high, higher than before, at 17.2% -At this visit, he tells me that he was not compliant with diet, exercise, and his insulin regimen since last visit.  In fact, she stopped 01/01/20 completely several months ago.  I explained that this is very dangerous and he absolutely needs to continue with both insulin types!  He is determined to get back on track with his diabetes control.  We will start back on the Tresiba initially at 26 units (refill prescription) and then increase to 40 units as needed.  He was previously injecting this in legs and I advised him to move the injection to a more central location.  Regarding his Humalog, he was injecting it right before the meal so to help him with compliance, will change to rapid onset Humalog (Lyumjev) which can be injected at the start of the meal.  He is currently taking 20 units of Humalog to compensate for the lack of basal insulin.  I advised him to only use 12 to 14  units of the new insulin before meals. -He is also interested in getting back on the CGM.  I advised him to use the freestyle libre 2 CGM which now has alarms.  Send prescription for the new sensor and receiver to his  pharmacy. -He also needs an eye doctor.  Given the address for Dr. Ranae Pila - I suggested to:  Patient Instructions  Please continue: - Tresiba 40 units daily (start at 26 units) (in abdomen)  Change: - Humalog >> Lyumjev  12-14 units before meals (in arms or legs)  Restart the new CGM.  Please call and schedule an eye appt with Dr. Randon Goldsmith: [facility]:  Dr. Jeni Salles MD ?  Address: [address] [place], [place], [place] [address]  [phone]  Please return in 1.5 months.   - advised to check sugars at different times of the day - 4x a day, rotating check times - advised for yearly eye exams >> he is not UTD - return to clinic in 3 months  2.  Dyslipidemia -At last visit, his LDL was at goal but HDL cholesterol was low -No need for statins for now, but will consider these in the future -We first need to work on controlling his diabetes better.  Also, he bought a new gym membership and will start going to the gym.  He already started to improve his diet.  Carlus Pavlov, MD PhD [facility]

## 2020-03-23 NOTE — Telephone Encounter (Signed)
Patient called re::The cost of the Freestyle [place] 2 Sensors is too expensive. Patient requests a new RX for the [employer] (regular) be sent to:   Karin Golden at [facility] [address] [place] - [address] Phone:  [phone]  Fax:  [phone]

## 2020-05-05 ENCOUNTER — Ambulatory Visit: Payer: Managed Care, Other (non HMO) | Admitting: Internal Medicine

## 2020-06-01 IMAGING — CT CT HEAD W/O CM
3 series · 16 of 47 positions shown, 19 images · non-contrast
Comparison: 07/03/2017

CLINICAL DATA: Confusion with severe headache yesterday. Diabetic
ketoacidosis.

EXAM:
CT HEAD WITHOUT CONTRAST
TECHNIQUE: Contiguous axial images were obtained from the base of the skull
through the vertex without intravenous contrast.

[Series 2: head wo · axial · 0.42mm/px · z∈[-150,-15]mm · 10 of 33 slices shown, 13 images]
[im 3/33  brain]
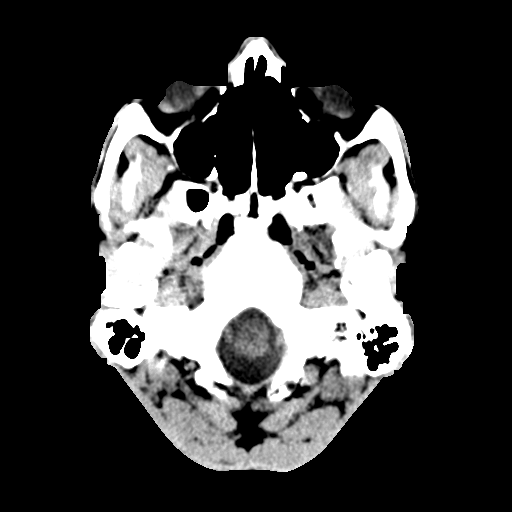
[im 3/33  bone]
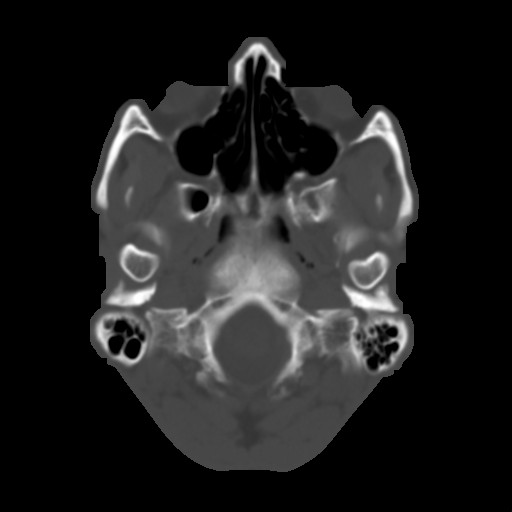
[im 6/33  brain]
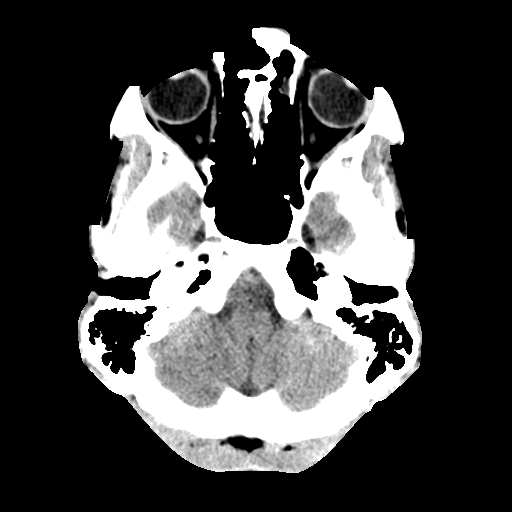
[im 9/33  brain]
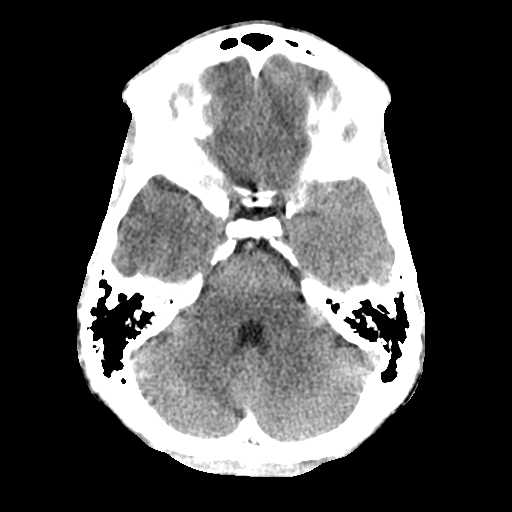
[im 12/33  brain]
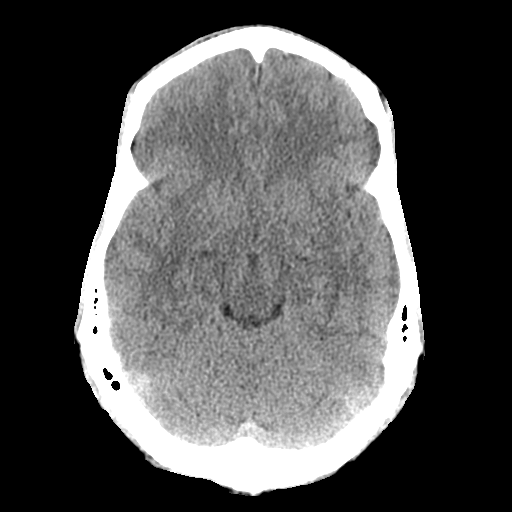
[im 15/33  brain]
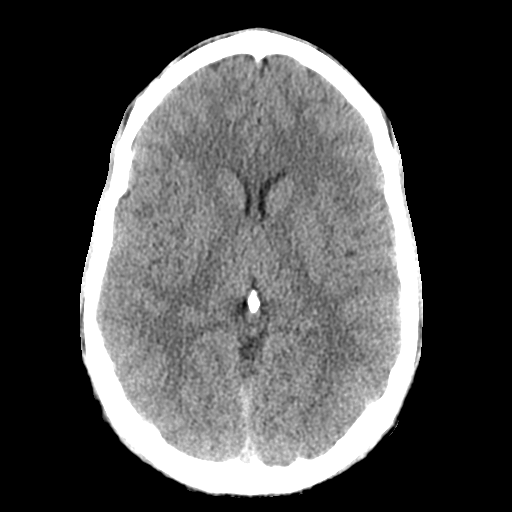
[im 15/33  bone]
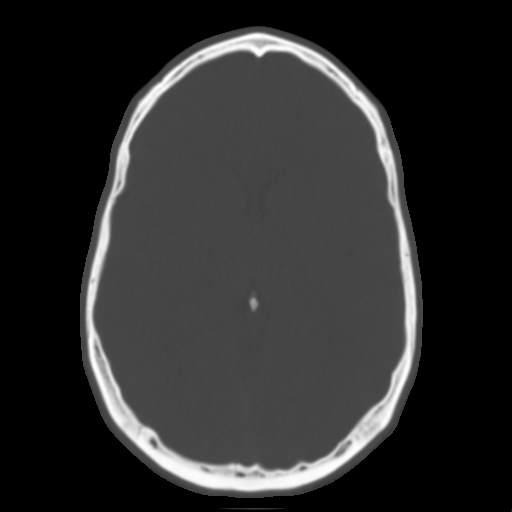
[im 18/33  brain]
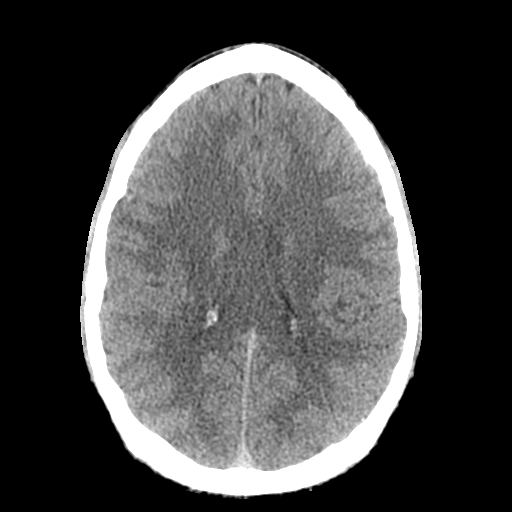
[im 21/33  brain]
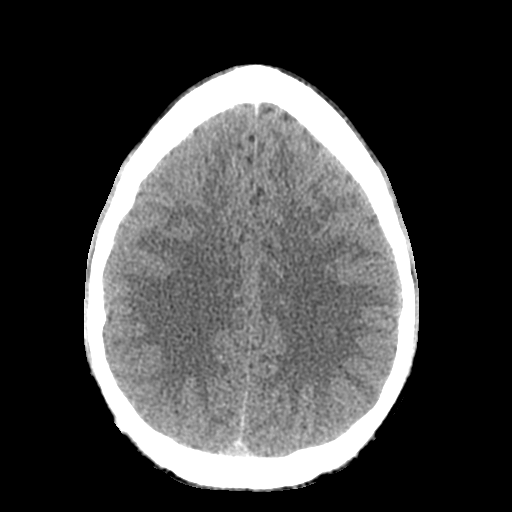
[im 25/33  brain]
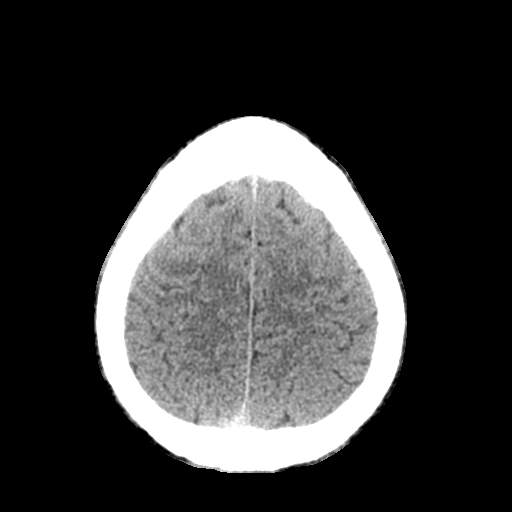
[im 27/33  brain]
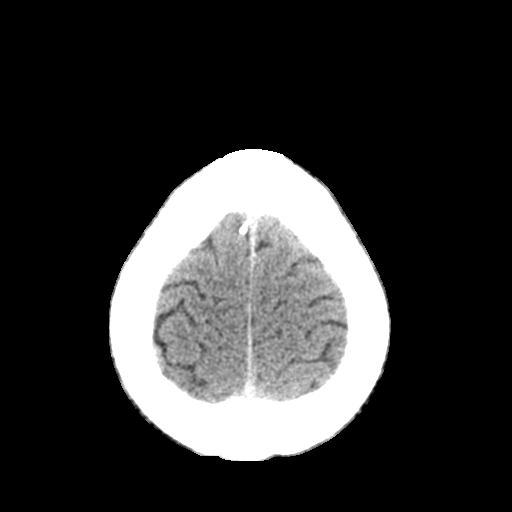
[im 27/33  bone]
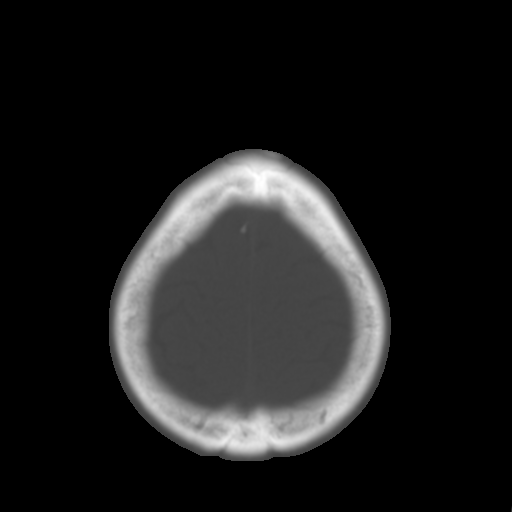
[im 30/33  brain]
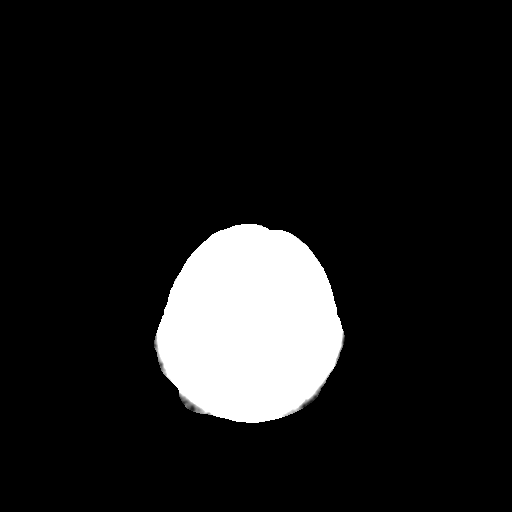

[Series 4: coronal soft tissue · coronal · 0.31mm/px · 3 of 68 slices shown]
[im 23/68  brain]
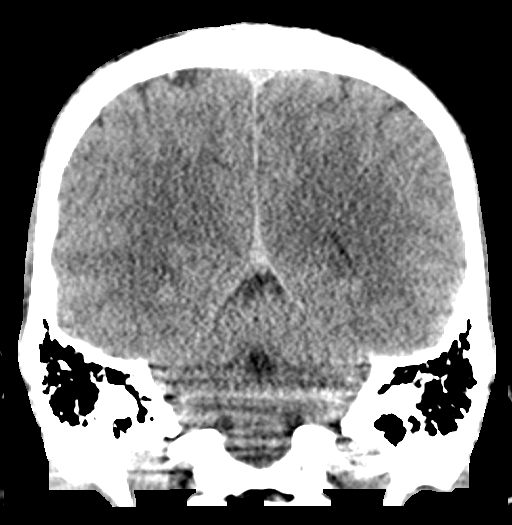
[im 30/68  brain]
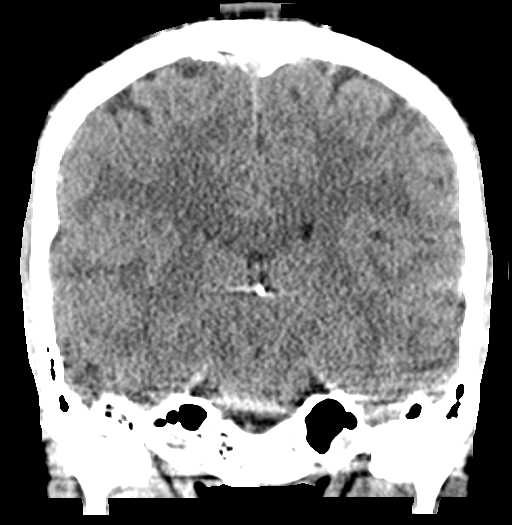
[im 38/68  brain]
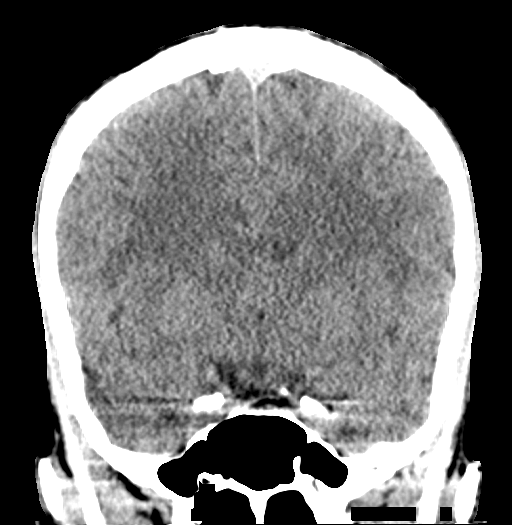

[Series 5: sagittal soft tissue · sagittal · 0.31mm/px · 3 of 53 slices shown]
[im 18/53  brain]
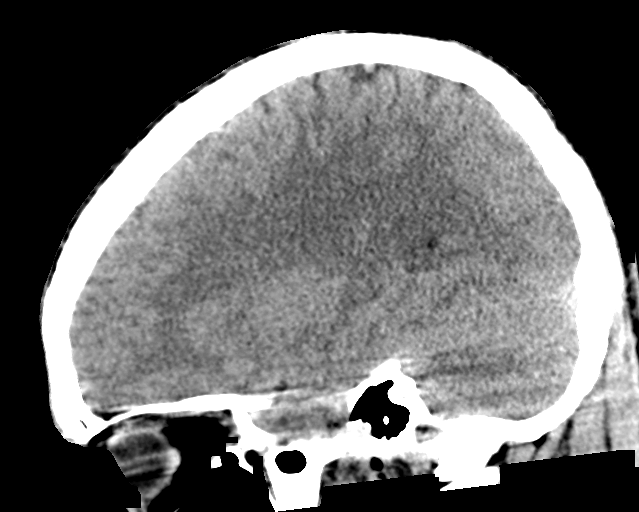
[im 27/53  brain]
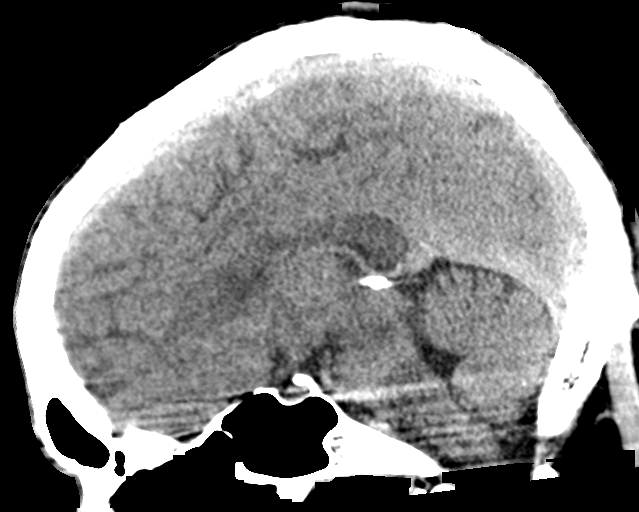
[im 35/53  brain]
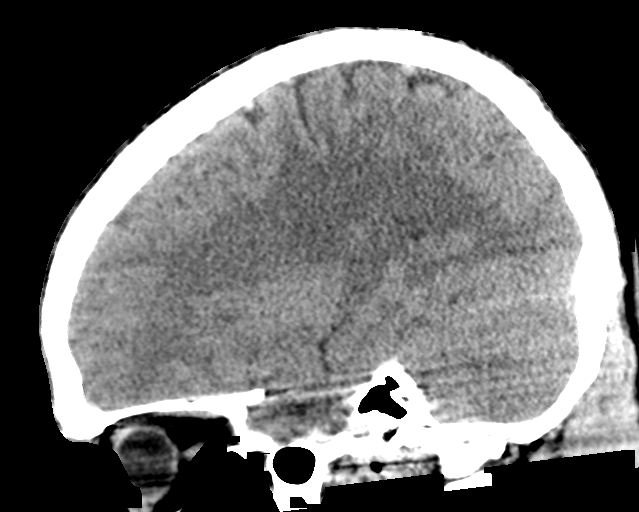

[16 of 47 positions shown; findings below may reference images not displayed]

FINDINGS: Brain: There is no evidence of acute infarct, intracranial
hemorrhage, mass, midline shift, or extra-axial fluid collection.
The ventricles and sulci are normal.

Vascular: No hyperdense vessel.

Skull: No fracture or focal osseous lesion.

Sinuses/Orbits: Opacification of a single anterior left ethmoid air
cell. Clear mastoid air cells. Dysconjugate gaze.

Other: None.
IMPRESSION: Unremarkable CT appearance of the brain.

## 2020-06-04 ENCOUNTER — Other Ambulatory Visit: Payer: Self-pay

## 2020-06-04 ENCOUNTER — Emergency Department (HOSPITAL_COMMUNITY): Payer: Managed Care, Other (non HMO)

## 2020-06-04 ENCOUNTER — Encounter (HOSPITAL_COMMUNITY): Payer: Self-pay | Admitting: Radiology

## 2020-06-04 ENCOUNTER — Inpatient Hospital Stay (HOSPITAL_COMMUNITY)
Admission: EM | Admit: 2020-06-04 | Discharge: 2020-06-07 | DRG: 637 | Disposition: A | Discharge status: Home / Self Care | Payer: Managed Care, Other (non HMO) | Attending: Internal Medicine | Admitting: Internal Medicine

## 2020-06-04 DIAGNOSIS — F1010 Alcohol abuse, uncomplicated: Secondary | ICD-10-CM | POA: Diagnosis present

## 2020-06-04 DIAGNOSIS — I517 Cardiomegaly: Secondary | ICD-10-CM | POA: Diagnosis present

## 2020-06-04 DIAGNOSIS — N179 Acute kidney failure, unspecified: Secondary | ICD-10-CM | POA: Diagnosis present

## 2020-06-04 DIAGNOSIS — D72825 Bandemia: Secondary | ICD-10-CM

## 2020-06-04 DIAGNOSIS — E875 Hyperkalemia: Secondary | ICD-10-CM | POA: Diagnosis present

## 2020-06-04 DIAGNOSIS — Z91010 Allergy to peanuts: Secondary | ICD-10-CM

## 2020-06-04 DIAGNOSIS — E139 Other specified diabetes mellitus without complications: Secondary | ICD-10-CM | POA: Diagnosis not present

## 2020-06-04 DIAGNOSIS — F10239 Alcohol dependence with withdrawal, unspecified: Secondary | ICD-10-CM | POA: Diagnosis present

## 2020-06-04 DIAGNOSIS — Z79899 Other long term (current) drug therapy: Secondary | ICD-10-CM

## 2020-06-04 DIAGNOSIS — Z794 Long term (current) use of insulin: Secondary | ICD-10-CM | POA: Diagnosis not present

## 2020-06-04 DIAGNOSIS — Z20822 Contact with and (suspected) exposure to covid-19: Secondary | ICD-10-CM | POA: Diagnosis present

## 2020-06-04 DIAGNOSIS — E876 Hypokalemia: Secondary | ICD-10-CM | POA: Diagnosis present

## 2020-06-04 DIAGNOSIS — G9341 Metabolic encephalopathy: Secondary | ICD-10-CM | POA: Diagnosis present

## 2020-06-04 DIAGNOSIS — Z9114 Patient's other noncompliance with medication regimen: Secondary | ICD-10-CM | POA: Diagnosis not present

## 2020-06-04 DIAGNOSIS — Z833 Family history of diabetes mellitus: Secondary | ICD-10-CM | POA: Diagnosis not present

## 2020-06-04 DIAGNOSIS — Z87891 Personal history of nicotine dependence: Secondary | ICD-10-CM

## 2020-06-04 DIAGNOSIS — E1010 Type 1 diabetes mellitus with ketoacidosis without coma: Principal | ICD-10-CM | POA: Diagnosis present

## 2020-06-04 DIAGNOSIS — R748 Abnormal levels of other serum enzymes: Secondary | ICD-10-CM | POA: Diagnosis present

## 2020-06-04 LAB — BLOOD GAS, VENOUS
Bicarbonate: 2.6 mmol/L — ABNORMAL LOW (ref 20.0–28.0)
FIO2: 21
O2 Saturation: 94.1 %
Patient temperature: 98.6
pCO2, Ven: <19 mmHg — CL (ref 44.0–60.0)
pH, Ven: 6.871 — CL (ref 7.250–7.430)
pO2, Ven: 99.3 mmHg — ABNORMAL HIGH (ref 32.0–45.0)

## 2020-06-04 LAB — BASIC METABOLIC PANEL
Anion gap: 16 — ABNORMAL HIGH (ref 5–15)
BUN: 15 mg/dL (ref 6–20)
BUN: 16 mg/dL (ref 6–20)
BUN: 17 mg/dL (ref 6–20)
CO2: <7 mmol/L — ABNORMAL LOW (ref 22–32)
CO2: <7 mmol/L — ABNORMAL LOW (ref 22–32)
CO2: 8 mmol/L — ABNORMAL LOW (ref 22–32)
Calcium: 8.4 mg/dL — ABNORMAL LOW (ref 8.9–10.3)
Calcium: 8.1 mg/dL — ABNORMAL LOW (ref 8.9–10.3)
Calcium: 8.2 mg/dL — ABNORMAL LOW (ref 8.9–10.3)
Chloride: 109 mmol/L (ref 98–111)
Chloride: 118 mmol/L — ABNORMAL HIGH (ref 98–111)
Chloride: 122 mmol/L — ABNORMAL HIGH (ref 98–111)
Creatinine, Ser: 1.68 mg/dL — ABNORMAL HIGH (ref 0.61–1.24)
Creatinine, Ser: 1.55 mg/dL — ABNORMAL HIGH (ref 0.61–1.24)
Creatinine, Ser: 1.23 mg/dL (ref 0.61–1.24)
GFR calc Af Amer: 59 mL/min — ABNORMAL LOW (ref >60)
GFR calc Af Amer: >60 mL/min (ref >60)
GFR calc Af Amer: >60 mL/min (ref >60)
GFR calc non Af Amer: 51 mL/min — ABNORMAL LOW (ref >60)
GFR calc non Af Amer: 56 mL/min — ABNORMAL LOW (ref >60)
GFR calc non Af Amer: >60 mL/min (ref >60)
Glucose, Bld: 498 mg/dL — ABNORMAL HIGH (ref 70–99)
Glucose, Bld: 395 mg/dL — ABNORMAL HIGH (ref 70–99)
Glucose, Bld: 290 mg/dL — ABNORMAL HIGH (ref 70–99)
Potassium: 5.3 mmol/L — ABNORMAL HIGH (ref 3.5–5.1)
Potassium: 4.5 mmol/L (ref 3.5–5.1)
Potassium: 3.6 mmol/L (ref 3.5–5.1)
Sodium: 140 mmol/L (ref 135–145)
Sodium: 144 mmol/L (ref 135–145)
Sodium: 146 mmol/L — ABNORMAL HIGH (ref 135–145)

## 2020-06-04 LAB — CBG MONITORING, ED
Glucose-Capillary: 484 mg/dL — ABNORMAL HIGH (ref 70–99)
Glucose-Capillary: 423 mg/dL — ABNORMAL HIGH (ref 70–99)
Glucose-Capillary: 405 mg/dL — ABNORMAL HIGH (ref 70–99)
Glucose-Capillary: 429 mg/dL — ABNORMAL HIGH (ref 70–99)
Glucose-Capillary: 363 mg/dL — ABNORMAL HIGH (ref 70–99)
Glucose-Capillary: 372 mg/dL — ABNORMAL HIGH (ref 70–99)
Glucose-Capillary: 343 mg/dL — ABNORMAL HIGH (ref 70–99)
Glucose-Capillary: 240 mg/dL — ABNORMAL HIGH (ref 70–99)
Glucose-Capillary: 261 mg/dL — ABNORMAL HIGH (ref 70–99)
Glucose-Capillary: 226 mg/dL — ABNORMAL HIGH (ref 70–99)
Glucose-Capillary: 205 mg/dL — ABNORMAL HIGH (ref 70–99)
Glucose-Capillary: 173 mg/dL — ABNORMAL HIGH (ref 70–99)

## 2020-06-04 LAB — CBC
HCT: 53 % — ABNORMAL HIGH (ref 39.0–52.0)
HCT: 52.5 % — ABNORMAL HIGH (ref 39.0–52.0)
Hemoglobin: 16.3 g/dL (ref 13.0–17.0)
Hemoglobin: 16.2 g/dL (ref 13.0–17.0)
MCH: 27.5 pg (ref 26.0–34.0)
MCH: 27.7 pg (ref 26.0–34.0)
MCHC: 30.8 g/dL (ref 30.0–36.0)
MCHC: 30.9 g/dL (ref 30.0–36.0)
MCV: 89.5 fL (ref 80.0–100.0)
MCV: 89.7 fL (ref 80.0–100.0)
Platelets: 253 10*3/uL (ref 150–400)
Platelets: 246 10*3/uL (ref 150–400)
RBC: 5.92 MIL/uL — ABNORMAL HIGH (ref 4.22–5.81)
RBC: 5.85 MIL/uL — ABNORMAL HIGH (ref 4.22–5.81)
RDW: 13.6 % (ref 11.5–15.5)
RDW: 13.4 % (ref 11.5–15.5)
WBC: 12.6 10*3/uL — ABNORMAL HIGH (ref 4.0–10.5)
WBC: 14 10*3/uL — ABNORMAL HIGH (ref 4.0–10.5)
nRBC: 0 % (ref 0.0–0.2)
nRBC: 0 % (ref 0.0–0.2)

## 2020-06-04 LAB — HEPATIC FUNCTION PANEL
ALT: 18 U/L (ref 0–44)
AST: 18 U/L (ref 15–41)
Albumin: 4.5 g/dL (ref 3.5–5.0)
Alkaline Phosphatase: 85 U/L (ref 38–126)
Bilirubin, Direct: <0.1 mg/dL (ref 0.0–0.2)
Total Bilirubin: 1.1 mg/dL (ref 0.3–1.2)
Total Protein: 7.9 g/dL (ref 6.5–8.1)

## 2020-06-04 LAB — BETA-HYDROXYBUTYRIC ACID
Beta-Hydroxybutyric Acid: >8 mmol/L — ABNORMAL HIGH (ref 0.05–0.27)
Beta-Hydroxybutyric Acid: 5.39 mmol/L — ABNORMAL HIGH (ref 0.05–0.27)

## 2020-06-04 LAB — LIPASE, BLOOD: Lipase: 132 U/L — ABNORMAL HIGH (ref 11–51)

## 2020-06-04 LAB — HIV ANTIBODY (ROUTINE TESTING W REFLEX): HIV Screen 4th Generation wRfx: NONREACTIVE

## 2020-06-04 LAB — PHOSPHORUS: Phosphorus: 3.8 mg/dL (ref 2.5–4.6)

## 2020-06-04 LAB — SARS CORONAVIRUS 2 BY RT PCR (HOSPITAL ORDER, PERFORMED IN ~~LOC~~ HOSPITAL LAB): SARS Coronavirus 2: NEGATIVE

## 2020-06-04 LAB — ETHANOL: Alcohol, Ethyl (B): <10 mg/dL (ref <10)

## 2020-06-04 LAB — MAGNESIUM: Magnesium: 2.6 mg/dL — ABNORMAL HIGH (ref 1.7–2.4)

## 2020-06-04 MED ORDER — SODIUM CHLORIDE 0.9 % IV BOLUS: 500.0000 mL | Freq: Once | INTRAVENOUS | Status: DC

## 2020-06-04 MED ORDER — ENOXAPARIN SODIUM 40 MG/0.4ML ~~LOC~~ SOLN
40.0000 mg | SUBCUTANEOUS | Status: DC
  Administered 2020-06-04 – 2020-06-06 (×3): 40 mg via SUBCUTANEOUS
  Filled 2020-06-04 (×5): qty 0.4

## 2020-06-04 MED ORDER — DEXTROSE IN LACTATED RINGERS 5 % IV SOLN
INTRAVENOUS | Status: DC
  Filled 2020-06-04 (×2): qty 1000

## 2020-06-04 MED ORDER — DEXTROSE 50 % IV SOLN: 0.0000 mL | INTRAVENOUS | Status: DC | PRN

## 2020-06-04 MED ORDER — DEXTROSE IN LACTATED RINGERS 5 % IV SOLN: INTRAVENOUS | Status: DC

## 2020-06-04 MED ORDER — LABETALOL HCL 5 MG/ML IV SOLN: 10.0000 mg | INTRAVENOUS | Status: DC | PRN

## 2020-06-04 MED ORDER — INSULIN REGULAR(HUMAN) IN NACL 100-0.9 UT/100ML-% IV SOLN
INTRAVENOUS | Status: DC
  Filled 2020-06-04 (×2): qty 100

## 2020-06-04 MED ORDER — THIAMINE HCL 100 MG PO TABS
100.0000 mg | ORAL_TABLET | Freq: Every day | ORAL | Status: DC
  Administered 2020-06-06 – 2020-06-07 (×2): 100 mg via ORAL
  Filled 2020-06-04 (×3): qty 1

## 2020-06-04 MED ORDER — ADULT MULTIVITAMIN W/MINERALS CH
1.0000 | ORAL_TABLET | Freq: Every day | ORAL | Status: DC
  Administered 2020-06-06 – 2020-06-07 (×2): 1 via ORAL
  Filled 2020-06-04 (×3): qty 1

## 2020-06-04 MED ORDER — LACTATED RINGERS IV SOLN: INTRAVENOUS | Status: DC

## 2020-06-04 MED ORDER — SODIUM CHLORIDE 0.9 % IV BOLUS
1000.0000 mL | Freq: Once | INTRAVENOUS | Status: AC
  Administered 2020-06-04: 1000 mL via INTRAVENOUS

## 2020-06-04 MED ORDER — THIAMINE HCL 100 MG/ML IJ SOLN
100.0000 mg | Freq: Every day | INTRAMUSCULAR | Status: DC
  Administered 2020-06-04 – 2020-06-05 (×2): 100 mg via INTRAVENOUS
  Filled 2020-06-04 (×2): qty 2

## 2020-06-04 MED ORDER — FOLIC ACID 1 MG PO TABS
1.0000 mg | ORAL_TABLET | Freq: Every day | ORAL | Status: DC
  Filled 2020-06-04: qty 1

## 2020-06-04 MED ORDER — LORAZEPAM 1 MG PO TABS: 1.0000 mg | ORAL_TABLET | ORAL | Status: DC | PRN

## 2020-06-04 MED ORDER — LORAZEPAM 2 MG/ML IJ SOLN
1.0000 mg | INTRAMUSCULAR | Status: DC | PRN
  Administered 2020-06-04: 2 mg via INTRAVENOUS
  Filled 2020-06-04: qty 1

## 2020-06-04 MED ORDER — INSULIN REGULAR(HUMAN) IN NACL 100-0.9 UT/100ML-% IV SOLN
INTRAVENOUS | Status: DC
  Administered 2020-06-04: 10 [IU]/h via INTRAVENOUS
  Filled 2020-06-04: qty 100

## 2020-06-04 MED ORDER — SODIUM BICARBONATE 8.4 % IV SOLN
50.0000 meq | Freq: Once | INTRAVENOUS | Status: AC
  Administered 2020-06-04: 50 meq via INTRAVENOUS
  Filled 2020-06-04: qty 50

## 2020-06-04 NOTE — ED Provider Notes (Signed)
Medical screening examination/treatment/procedure(s) were conducted as a shared visit with non-physician practitioner(s) and myself.  I personally evaluated the patient during the encounter. Briefly, the patient is a 39 y.o. male with history of diabetes insulin-dependent who presents to the ED with altered mental status, shortness of breath.  EMS found patient to have blood sugar in the 300s.  Blood sugar upon arrival here is 484.  He is confused but is able to answer questions.  Moves all extremities.  No signs of any external trauma.  Possibly some alcohol use as well.  Will give IV fluids and evaluate for DKA.  Will evaluate for infectious process as well and get a head CT.  Anticipate admission for likely DKA and need for insulin and fluids.  Please see PA note for further results, eval, dispo.   EKG Interpretation None           Virgina Norfolk, DO 06/04/20 [phone]

## 2020-06-04 NOTE — ED Notes (Signed)
No urine sample available at this time. Will continue to check back. 

## 2020-06-04 NOTE — ED Triage Notes (Addendum)
Patient bib gems, family called reporting patient was "breathing fast" and stated this usually happens when his blood sugar is high. CBG w/ EMS 330. CBG in triage - 484. resp 25. Patient denies pain. Family reports patient ate breakfast today and took insulin

## 2020-06-04 NOTE — ED Provider Notes (Signed)
[facility]-EMERGENCY DEPT Provider Note   CSN: [id number] Arrival date & time: 06/04/20  2831     History Chief Complaint  Patient presents with  . Hyperglycemia  . Shortness of Breath    Edward Walls is a 39 y.o. male with history of T1DM with history of poor compliance and recurrent DKA, documented alcohol use brought to ED by EMS from home for altered mental status due to possible diabetic crisis.  History initially obtained from EMS and patient. Reportedly family noticed fast breathing and more confusion.  Family states this is how he gets when his sugar levels are high.  Patient able to tell me his name with slurred speech. Appears somnolent.  Can follow commands with slow movements.  Can sit up on his own.  Inconsistent history provided by patient. First tells me he has been drinking alcohol but then denies it.  Then tells me he hasn't drank any water and is thirsty.  States he has been using his insulin. Denies any pain or headache. No chest pain. No shortness of breath. Level 5 caveat due to AMS, acuity of condition. Will call family to obtain collateral history.    HPI     Past Medical History:  Diagnosis Date  . Allergy   . Diabetes mellitus without complication [facility])     Patient Active Problem List   Diagnosis Date Noted  . DKA, type 1 (HCC) 06/04/2020  . Dyslipidemia 03/23/2020  . 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 Onychomycosis 01/28/2020  . Alcohol abuse 03/14/2018  . Type 1 diabetes mellitus with ketoacidosis, uncontrolled (HCC) 03/14/2018  . Abnormal weight loss 07/04/2017  . AKI (acute kidney injury) (HCC) 07/03/2017  . Headache 07/03/2017  . Nausea & vomiting 07/03/2017  . Abdominal pain 07/03/2017  . DKA (diabetic ketoacidoses) (HCC) 10/20/2016    Past Surgical History:  Procedure Laterality Date  . DENTAL SURGERY         Family History  Problem Relation Age of Onset  . Diabetes Mellitus  II Mother   . Diabetes Mellitus II Father   . Diabetes Mellitus II Brother     Social History   Tobacco Use  . Smoking status: Former Smoker    Types: Cigarettes  . Smokeless tobacco: Never Used  Substance Use Topics  . Alcohol use: Yes    Alcohol/week: 9.0 standard drinks    Types: 1 Cans of beer, 8 Shots of liquor per week  . Drug use: Not Currently    Home Medications Prior to Admission medications   Medication Sig Start Date End Date Taking? Authorizing Provider  insulin degludec (TRESIBA FLEXTOUCH) 200 UNIT/ML FlexTouch Pen Inject 40 Units into the skin daily. 03/23/20  Yes Carlus Pavlov, MD  Insulin Lispro-aabc, 1 U Dial, (LYUMJEV KWIKPEN) 100 UNIT/ML SOPN Inject 12-14 units 3x a day before meals Patient taking differently: Inject 12-14 Units into the skin 3 (three) times daily with meals.  03/23/20  Yes Carlus Pavlov, MD  Skin Protectants, Misc. (EUCERIN) cream Apply topically in the morning and at bedtime. Patient taking differently: Apply 1 application topically 2 (two) times daily as needed for dry skin.  12/30/19  Yes Nche, Bonna Gains, NP  triamcinolone ointment (KENALOG) 0.1 % Apply 1 application topically 2 (two) times daily. Patient taking differently: Apply 1 application topically 2 (two) times daily as needed (rash).  12/30/19  Yes Nche, Bonna Gains, NP  Continuous Blood Gluc Sensor (FREESTYLE LIBRE 14 DAY SENSOR)  MISC For continuous glucose monitoring 03/23/20   Carlus Pavlov, MD  Glucagon (BAQSIMI ONE PACK) 3 MG/DOSE POWD Place 3 mg into the nose once as needed for up to 1 dose. 03/23/20   Carlus Pavlov, MD  Insulin Pen Needle 32G X 4 MM MISC Use 4-5x a day 03/23/20   Carlus Pavlov, MD  terbinafine (LAMISIL) 250 MG tablet Take 1 tablet (250 mg total) by mouth daily. Patient not taking: Reported on 06/04/2020 01/28/20   Mliss Sax, MD    Allergies    Peanut-containing drug products  Review of Systems   Review of Systems  Unable to  perform ROS: Mental status change  All other systems reviewed and are negative.   Physical Exam Updated Vital Signs BP (!) 160/106   Pulse (!) 110   Temp 98.2 F (36.8 C) (Oral)   Resp (!) 24   Ht 6' (1.829 m)   Wt 82.6 kg   SpO2 100%   BMI 24.70 kg/m   Physical Exam Vitals and nursing note reviewed.  Constitutional:      General: He is not in acute distress.    Appearance: He is well-developed.     Comments: Appears tired, wakes up with verbal stimulus.    HENT:     Head: Normocephalic and atraumatic.     Comments: No palpable crepitus or tenderness on facial or scalp bones. No signs of facial/head trauma.     Right Ear: External ear normal.     Left Ear: External ear normal.     Nose: Nose normal.     Mouth/Throat:     Comments: Lips cracked, top of tongue dry. No tongue or intraoral injury or bleeding.  Eyes:     General: No scleral icterus.    Conjunctiva/sclera: Conjunctivae normal.  Cardiovascular:     Rate and Rhythm: Regular rhythm. Tachycardia present.     Heart sounds: Normal heart sounds. No murmur heard.      Comments: Palpable rhythm feels regular. No lower extremity edema. No murmur Pulmonary:     Effort: Tachypnea present.     Breath sounds: Normal breath sounds.     Comments: Patient on end tidal monitor.  Noted tachypnea. Speaking in full but slow sentences. Lungs clear throughout without crackles, wheezing.  Abdominal:     Palpations: Abdomen is soft.     Tenderness: There is no abdominal tenderness.  Musculoskeletal:        General: No deformity. Normal range of motion.     Cervical back: Normal range of motion and neck supple.  Skin:    General: Skin is warm and dry.     Capillary Refill: Capillary refill takes less than 2 seconds.  Neurological:     Mental Status: He is alert. He is disoriented.     Comments: Pupils symmetric 2-3 mm, reactive, round.  No conjunctival injection.  Speech is slow and slurred, no obvious aphasia but history is  inconsistent at times telling me he has been drinking but then denying this.  Can follow commands.  Symmetric strength with hand grip, leg raise bilaterally.  Sensation grossly normal. No obvious facial droop.   Psychiatric:        Behavior: Behavior normal.        Thought Content: Thought content normal.        Judgment: Judgment normal.     ED Results / Procedures / Treatments   Labs (all labs ordered are listed, but only abnormal results are displayed) Labs  Reviewed  BASIC METABOLIC PANEL - Abnormal; Notable for the following components:      Result Value   Potassium 5.3 (*)    CO2 <7 (*)    Glucose, Bld 498 (*)    Creatinine, Ser 1.68 (*)    Calcium 8.4 (*)    GFR calc non Af Amer 51 (*)    GFR calc Af Amer 59 (*)    All other components within normal limits  CBC - Abnormal; Notable for the following components:   WBC 12.6 (*)    RBC 5.92 (*)    HCT 53.0 (*)    All other components within normal limits  LIPASE, BLOOD - Abnormal; Notable for the following components:   Lipase 132 (*)    All other components within normal limits  BLOOD GAS, VENOUS - Abnormal; Notable for the following components:   pH, Ven 6.871 (*)    pCO2, Ven <19.0 (*)    pO2, Ven 99.3 (*)    Bicarbonate 2.6 (*)    All other components within normal limits  CBG MONITORING, ED - Abnormal; Notable for the following components:   Glucose-Capillary 484 (*)    All other components within normal limits  SARS CORONAVIRUS 2 BY RT PCR (HOSPITAL ORDER, PERFORMED IN [facility])  ETHANOL  HEPATIC FUNCTION PANEL  URINALYSIS, ROUTINE W REFLEX MICROSCOPIC  RAPID URINE DRUG SCREEN, HOSP PERFORMED  BETA-HYDROXYBUTYRIC ACID  BETA-HYDROXYBUTYRIC ACID  HIV ANTIBODY (ROUTINE TESTING W REFLEX)  BASIC METABOLIC PANEL  BASIC METABOLIC PANEL  BASIC METABOLIC PANEL  BASIC METABOLIC PANEL  HEMOGLOBIN A1C  CBG MONITORING, ED    EKG EKG Interpretation  Date/Time:  Friday June 04 2020 09:31:48  EDT Ventricular Rate:  117 PR Interval:    QRS Duration: 91 QT Interval:  372 QTC Calculation: 519 R Axis:   89 Text Interpretation: Sinus tachycardia Right atrial enlargement Left ventricular hypertrophy Anterior Q waves, possibly due to LVH Prolonged QT interval Confirmed by Virgina Norfolk [phone]) on 06/04/2020 10:05:49 AM   Radiology CT Head Wo Contrast  Result Date: 06/04/2020 CLINICAL DATA:  Altered mental status EXAM: CT HEAD WITHOUT CONTRAST TECHNIQUE: Contiguous axial images were obtained from the base of the skull through the vertex without intravenous contrast. COMPARISON:  10/14/2018 FINDINGS: Brain: No evidence of acute infarction, hemorrhage, hydrocephalus, extra-axial collection or mass lesion/mass effect. Vascular: No hyperdense vessel or unexpected calcification. Skull: Normal. Negative for fracture or focal lesion. Sinuses/Orbits: No acute finding. Other: None. IMPRESSION: No acute intracranial findings. Electronically Signed   By: Duanne Guess D.O.   On: 06/04/2020 10:20   DG Chest Portable 1 View  Result Date: 06/04/2020 CLINICAL DATA:  Shortness of breath and tachypnea EXAM: PORTABLE CHEST 1 VIEW COMPARISON:  October 14, 2018 FINDINGS: Lungs are clear. Heart size and pulmonary vascularity are normal. No adenopathy. There is upper thoracic levoscoliosis. IMPRESSION: Lungs clear.  Cardiac silhouette normal. Electronically Signed   By: Bretta Bang III M.D.   On: 06/04/2020 09:59    Procedures .Critical Care Performed by: Liberty Handy, PA-C Authorized by: Liberty Handy, PA-C   Critical care provider statement:    Critical care time (minutes):  45   Critical care was necessary to treat or prevent imminent or life-threatening deterioration of the following conditions:  Endocrine crisis   Critical care was time spent personally by me on the following activities:  Discussions with consultants, evaluation of patient's response to treatment, examination of  patient, ordering and performing treatments  and interventions, ordering and review of laboratory studies, ordering and review of radiographic studies, pulse oximetry, re-evaluation of patient's condition, obtaining history from patient or surrogate, review of old charts and development of treatment plan with patient or surrogate   I assumed direction of critical care for this patient from another provider in my specialty: no     (including critical care time)  Medications Ordered in ED Medications  enoxaparin (LOVENOX) injection 40 mg (has no administration in time range)  insulin regular, human (MYXREDLIN) 100 units/ 100 mL infusion (has no administration in time range)  lactated ringers infusion (has no administration in time range)  dextrose 5 % in lactated ringers infusion (has no administration in time range)  dextrose 50 % solution 0-50 mL (has no administration in time range)  sodium chloride 0.9 % bolus 1,000 mL (1,000 mLs Intravenous New Bag/Given 06/04/20 1002)  sodium chloride 0.9 % bolus 1,000 mL (1,000 mLs Intravenous New Bag/Given 06/04/20 1002)    ED Course  I have reviewed the triage vital signs and the nursing notes.  Pertinent labs & imaging results that were available during my care of the patient were reviewed by me and considered in my medical decision making (see chart for details).  Clinical Course as of Jun 04 1150  Fri Jun 04, 2020  [id number] Called patient's mother Kerry FortVickey.  She is outside ED.  Was called by his gf last night noticed Sharia ReeveJosh was acting odd.  Mother went to his house pt had wet rag over his head. C/o headache. Mom gave him some food. Yesterday mother concern pt was saying things that weren't making sense. Told patient to go to hospital, pt declined. Gf called again 7 am concern pt getting worse.  Mother states he was incoherent, moving slowly,"talking crazy".  Last year sugars 700-800 and patient admitted.  Mother says he has said he is taking his insulin but  unsure of compliance.  Drinks alcohol but has been saying he has slowed down.     [CG]  1019 Glucose(!): 498 [CG]  1019 Creatinine(!): 1.68 [CG]  1020 GFR, Est African American(!): 59 [CG]  1020 Potassium(!): 5.3 [CG]  1020 CO2(!): <7 [CG]  1020 Sodium: 140 [CG]  1020 Anion gap: PENDING [CG]  1020 WBC(!): 12.6 [CG]  1020 Hemoglobin: 16.3 [CG]  1020 Alcohol, Ethyl (B): <10 [CG]  1038 Endotool ordered at 1020.  Messaged RN Melody to start this ASAP   [CG]  1107 Creatinine(!): 1.68 [CG]    Clinical Course User Index [CG] Liberty HandyGibbons, Palyn Scrima J, PA-C   MDM Rules/Calculators/A&P                          39 yo M with T1DM presents for increased respirations, AMS.  Documented history of alcohol use.   Arrives altered with [place] respirations, tachypnic, tachycardia, dry MMM.  No obvious unilateral weakness.  No pinpoint pupils.    I obtained additional history from triage, nursing notes and review of medical chart.  Previous medical records available, nursing notes reviewed to obtain more history and assist with MDM.  Admitted to [facility] last year for DKA with pH 6.8. presented similarly that time.  Chief complain involves an extensive number of treatment options and is a complaint that carries with it a high risk of complications and morbidity and mortality.    The differential diagnosis includes diabetic crisis DKA, HHS, secondary electrolyte abnormalities, ETOH, ICH/SDH given report of headache yesterday. Highest on ddx  is diabetic process.  No pin point lesions, on end tidal. Doubt opioid overdose.   I have ordered labs including screening labs, ETOH, BHA, VBG.  I ordered imaging including CXR, CT head, EKG.  On end tidal, continuous cardiac and pulse ox monitoring. CBG 484 here in triage.   ER work up personally visualized and interpreted.   CBG 498, AG pending. Calculated albumin corrected AG 27.1.  VpH 6.871. Bicarb 2.6. Creatinine 1.68.  GFR 59.  K 5.3.  EKG without hyperkalemic  changes, prolonged QTc 519, LVH.  CXR without edema, opacities, infiltrates, PTX or air under diaphragm.  CT head non acute.   Lab findings consisted with high AG DKA and AKI.      Shared with EDP. Initiated large volume NS IVF 2 L upon patient arrival pending labs given clinical dehydration.    Ordered Endotool at 1019.    1108: Patient re-evaluated multiple times. Mentation unchanged, continues to be somnolent but able to follow simple commands.  Inconsistent history with slow slurred speech.  Insulin running now.  Discussed with EDP. Will consult medicine team for admission to SDU.   1150: Discussed with Dr Alanda Slim who has accepted patient.  Final Clinical Impression(s) / ED Diagnoses Final diagnoses:  Diabetic ketoacidosis without coma associated with type 1 diabetes mellitus (HCC)  AKI (acute kidney injury) [facility])    Rx / DC Orders ED Discharge Orders    None       Liberty Handy, PA-C 06/04/20 1151    Virgina Norfolk, DO 06/04/20 1326

## 2020-06-04 NOTE — ED Notes (Signed)
Placed male purewick on pt set at 50 mmHg due to incontinence.

## 2020-06-04 NOTE — ED Notes (Signed)
Lab contacted to run betah acid.

## 2020-06-04 NOTE — ED Notes (Signed)
No urine specimen available still. Will continue to monitor.

## 2020-06-04 NOTE — ED Notes (Signed)
Patient unable to urinate. Provider notified.

## 2020-06-04 NOTE — H&P (Addendum)
History and Physical    Edward Walls NLG:[id number] DOB: [birth year] DOA: 06/04/2020  PCP: Mliss Sax, MD Patient coming from: home. Lives with girlfriend  Chief Complaint: Altered mental status  HPI: Edward Walls is a 39 y.o. male with history of DM-1 and ETOH abuse brought to ED by EMS for "altered mental status and high blood sugar".  Patient was somewhat incoherent and was not able to provide history.  He is oriented to self and partial place only.  Per patient's mother, she had a call from patient's girlfriend who was concerned about some confusion and incoherence yesterday.  Patient's mother went to check on him.  He attributed his confusion and symptoms to some misunderstanding with his girlfriend and not eating.  They wanted him to come to the hospital but he refused.  They brought him some fast food and left.  Patient's mother had a text from him about 4 AM this morning stating that he wanted to go to emergency department.  She is out of text after his girlfriend called earlier this morning about 7 AM.  When patient's mother arrived he was more incoherent and talking out of his mind.  So they called EMS and brought him to ED.  Patient had similar presentation in the past when his blood glucose is out of control.  Patient's mother think he still drinks alcohol but doesn't know how much.  She does not think he smokes cigarettes or use recreational drugs.  In ED, BP 165/112.  HR 117.  RR 26.  100% on RA.  Afebrile.  CBG 484.  VBG 6.87/<19/99/2.6.  WBC 12.6. Cr 1.68.  K5.3. Bicarb <7. AG not able to calculate.  CXR and CT head without acute finding.  Received 2 L IVF boluses.  Started on insulin drip and IV fluid per protocol.  BHA and UDS pending.  ROS Not able to obtain review of systems due to patient's encephalopathy.  PMH Past Medical History:  Diagnosis Date  . Allergy   . Diabetes mellitus without complication (HCC)    PSH Past Surgical History:  Procedure  Laterality Date  . DENTAL SURGERY     Fam HX Family History  Problem Relation Age of Onset  . Diabetes Mellitus II Mother   . Diabetes Mellitus II Father   . Diabetes Mellitus II Brother     Social Hx  reports that he has quit smoking. His smoking use included cigarettes. He has never used smokeless tobacco. He reports current alcohol use of about 9.0 standard drinks of alcohol per week. He reports previous drug use.  Allergy Allergies  Allergen Reactions  . Peanut-Containing Drug Products Other (See Comments)    Throat burning    Home Meds Prior to Admission medications   Medication Sig Start Date End Date Taking? Authorizing Provider  insulin degludec (TRESIBA FLEXTOUCH) 200 UNIT/ML FlexTouch Pen Inject 40 Units into the skin daily. 03/23/20  Yes Carlus Pavlov, MD  Insulin Lispro-aabc, 1 U Dial, (LYUMJEV KWIKPEN) 100 UNIT/ML SOPN Inject 12-14 units 3x a day before meals Patient taking differently: Inject 12-14 Units into the skin 3 (three) times daily with meals.  03/23/20  Yes Carlus Pavlov, MD  Skin Protectants, Misc. (EUCERIN) cream Apply topically in the morning and at bedtime. Patient taking differently: Apply 1 application topically 2 (two) times daily as needed for dry skin.  12/30/19  Yes Nche, Bonna Gains, NP  triamcinolone ointment (KENALOG) 0.1 % Apply 1 application topically 2 (two) times daily. Patient  taking differently: Apply 1 application topically 2 (two) times daily as needed (rash).  12/30/19  Yes Nche, Bonna Gains, NP  Continuous Blood Gluc Sensor (FREESTYLE LIBRE 14 DAY SENSOR) MISC For continuous glucose monitoring 03/23/20   Carlus Pavlov, MD  Glucagon (BAQSIMI ONE PACK) 3 MG/DOSE POWD Place 3 mg into the nose once as needed for up to 1 dose. 03/23/20   Carlus Pavlov, MD  Insulin Pen Needle 32G X 4 MM MISC Use 4-5x a day 03/23/20   Carlus Pavlov, MD  terbinafine (LAMISIL) 250 MG tablet Take 1 tablet (250 mg total) by mouth daily. Patient not  taking: Reported on 06/04/2020 01/28/20   Mliss Sax, MD    Physical Exam: Vitals:   06/04/20 1023 06/04/20 1030 06/04/20 1045 06/04/20 1100  BP: (!) 150/91 (!) 150/91  (!) 160/106  Pulse: (!) 104 (!) 109 (!) 111 (!) 110  Resp: 20 20 20  (!) 24  Temp:      TempSrc:      SpO2: 95% 100% 100% 100%  Weight:      Height:        GENERAL: No acute distress.  Appears well.  HEENT: MMM.  Vision and hearing grossly intact.  NECK: Supple.  No apparent JVD.  RESP:  No IWOB. Good air movement bilaterally. CVS: HR 110.  Regular rhythm. Heart sounds normal.  ABD/GI/GU: Bowel sounds present. Soft. Non tender.  MSK/EXT:  Moves extremities. No apparent deformity or edema.  SKIN: no apparent skin lesion or wound NEURO: Somewhat groggy.  Oriented to self and partial place. CN grossly intact.  No facial asymmetry.  PERRL.  Patellar reflex symmetric. PSYCH: Calm. Normal affect.   Personally Reviewed Radiological Exams CT Head Wo Contrast  Result Date: 06/04/2020 CLINICAL DATA:  Altered mental status EXAM: CT HEAD WITHOUT CONTRAST TECHNIQUE: Contiguous axial images were obtained from the base of the skull through the vertex without intravenous contrast. COMPARISON:  10/14/2018 FINDINGS: Brain: No evidence of acute infarction, hemorrhage, hydrocephalus, extra-axial collection or mass lesion/mass effect. Vascular: No hyperdense vessel or unexpected calcification. Skull: Normal. Negative for fracture or focal lesion. Sinuses/Orbits: No acute finding. Other: None. IMPRESSION: No acute intracranial findings. Electronically Signed   By: 10/16/2018 D.O.   On: 06/04/2020 10:20   DG Chest Portable 1 View  Result Date: 06/04/2020 CLINICAL DATA:  Shortness of breath and tachypnea EXAM: PORTABLE CHEST 1 VIEW COMPARISON:  October 14, 2018 FINDINGS: Lungs are clear. Heart size and pulmonary vascularity are normal. No adenopathy. There is upper thoracic levoscoliosis. IMPRESSION: Lungs clear.  Cardiac  silhouette normal. Electronically Signed   By: October 16, 2018 III M.D.   On: 06/04/2020 09:59     Personally Reviewed Labs: CBC: Recent Labs  Lab 06/04/20 0921  WBC 12.6*  HGB 16.3  HCT 53.0*  MCV 89.5  PLT 253   Basic Metabolic Panel: Recent Labs  Lab 06/04/20 0921  NA 140  K 5.3*  CL 109  CO2 <7*  GLUCOSE 498*  BUN 15  CREATININE 1.68*  CALCIUM 8.4*   GFR: Estimated Creatinine Clearance: 66.1 mL/min (A) (by C-G formula based on SCr of 1.68 mg/dL (H)). Liver Function Tests: Recent Labs  Lab 06/04/20 0921  AST 18  ALT 18  ALKPHOS 85  BILITOT 1.1  PROT 7.9  ALBUMIN 4.5   Recent Labs  Lab 06/04/20 0921  LIPASE 132*   No results for input(s): AMMONIA in the last 168 hours. Coagulation Profile: No results for input(s): INR, PROTIME in  the last 168 hours. Cardiac Enzymes: No results for input(s): CKTOTAL, CKMB, CKMBINDEX, TROPONINI in the last 168 hours. BNP (last 3 results) No results for input(s): PROBNP in the last 8760 hours. HbA1C: No results for input(s): HGBA1C in the last 72 hours. CBG: Recent Labs  Lab 06/04/20 0908 06/04/20 1154  GLUCAP 484* 423*   Lipid Profile: No results for input(s): CHOL, HDL, LDLCALC, TRIG, CHOLHDL, LDLDIRECT in the last 72 hours. Thyroid Function Tests: No results for input(s): TSH, T4TOTAL, FREET4, T3FREE, THYROIDAB in the last 72 hours. Anemia Panel: No results for input(s): VITAMINB12, FOLATE, FERRITIN, TIBC, IRON, RETICCTPCT in the last 72 hours. Urine analysis:    Component Value Date/Time   COLORURINE YELLOW 01/28/2020 1110   APPEARANCEUR CLEAR 01/28/2020 1110   LABSPEC 1.010 01/28/2020 1110   PHURINE 6.0 01/28/2020 1110   GLUCOSEU >=1000 (A) 01/28/2020 1110   HGBUR NEGATIVE 01/28/2020 1110   BILIRUBINUR NEGATIVE 01/28/2020 1110   KETONESUR 15 (A) 01/28/2020 1110   PROTEINUR 100 (A) 10/14/2018 1016   UROBILINOGEN 0.2 01/28/2020 1110   NITRITE NEGATIVE 01/28/2020 1110   LEUKOCYTESUR NEGATIVE  01/28/2020 1110    Sepsis Labs:  None  Personally Reviewed EKG:  Twelve-lead EKG pending.  Assessment/Plan Severe DKA in patient with uncontrolled DM-1: A1c 17% in 01/2020.  CBG 484.  Bicarb less than 7.  Not able to calculate AG.  pH 6.87.  BHA greater than 8.0.  DKA likely from alcohol and may be noncompliance.  Received 2 L of normal saline bolus and started on insulin drip. -Admit to stepdown unit -DKA pathway with IV fluid and IV insulin -CBG monitoring every hour.  BMP every 4 hours -Check hemoglobin A1c -N.p.o.  Acute metabolic encephalopathy: Likely due to the above.  No apparent focal neuro deficit.  CTA without acute finding. -Treat severe DKA as above  Alcohol abuse: No withdrawal symptoms. -CIWA monitoring  Mildly elevated lipase: Abdominal exam is benign but limited in the setting of mental status change.  Likely due to alcohol.  -Recheck in the morning  Hyperkalemia: Likely shift due to acidosis -Monitor acidosis as above  AKI: Likely due to DKA. -IV fluid as above  Anion gap metabolic acidosis-likely due to DKA -Manage as above  Leukocytosis: Likely hemoconcentration and demargination -Continue monitoring  DVT prophylaxis: Subcu Lovenox  Code Status: Full code Family Communication: Updated patient's mother over the phone  Disposition Plan: SDU for severe DKA and alcohol withdrawal Consults called: None Admission status: Inpatient  Severity of Illness: The appropriate patient status for this patient is INPATIENT. Inpatient status is judged to be reasonable and necessary in order to provide the required intensity of service to ensure the patient's safety. The patient's presenting symptoms, physical exam findings, and initial radiographic and laboratory data in the context of their chronic comorbidities is felt to place them at high risk for further clinical deterioration. Furthermore, it is not anticipated that the patient will be medically stable for  discharge from the hospital within 2 midnights of admission. The following factors support the patient status of inpatient.    "           The patient's presenting symptoms include encephalopathy "           The worrisome physical exam findings include encephalopathy, tachycardia, tachypnea and elevated blood pressure "           The initial radiographic and laboratory data are worrisome because severe anion gap acidosis with AKI "  The chronic co-morbidities include type 1 diabetes and alcohol abuse     I certify that at the point of admission it is my clinical judgment that the patient will require inpatient hospital care spanning beyond 2 midnights from the point of admission due to high intensity of service, high risk for further deterioration and high frequency of surveillance required.   Almon Hercules MD [facility]  If 7PM-7AM, please contact night-coverage [email or web address]  06/04/2020, 12:02 PM

## 2020-06-04 NOTE — ED Notes (Signed)
ENDO tool recommends to continue 10.5 units per hour, will continue to monitor.

## 2020-06-04 NOTE — ED Notes (Signed)
Pt is not responsive enough at this time to safely take oral meds. Will continue to assess.

## 2020-06-04 NOTE — Progress Notes (Signed)
TOC CM reviewed chart. Pt does have insurance and PCP. Will provided outpatient resources for substance abuse. Isidoro Donning RN CCM, [facility] ED TOC CM [phone]

## 2020-06-04 NOTE — ED Notes (Signed)
Pt has removed male purewick and urinated on bed. Pt cleaned with RN assistance and dry, clean linen applied. Condom cath placed on pt.

## 2020-06-04 NOTE — Progress Notes (Signed)
Inpatient Diabetes Program Recommendations  AACE/ADA: New Consensus Statement on Inpatient Glycemic Control (2015)  Target Ranges:  Prepandial:   less than 140 mg/dL      Peak postprandial:   less than 180 mg/dL (1-2 hours)      Critically ill patients:  140 - 180 mg/dL   Lab Results  Component Value Date   GLUCAP 173 (H) 06/04/2020   HGBA1C 17.2 (H) 01/28/2020    Review of Glycemic Control  Diabetes history: DM1 Outpatient Diabetes medications: Tresiba 40 units QD, Lispro aabc 12-14 units tidwc Current orders for Inpatient glycemic control: IV insulin per EndoTool for DKA  HgbA1C - 17.2%  Inpatient Diabetes Program Recommendations:     Continue IV insulin per EndoTool until criteria met for transitioning to SQ.  Will speak with pt regarding HgbA1C of 17.2% when appropriate.  Thank you. Lorenda Peck, RD, LDN, CDE Inpatient Diabetes Coordinator [phone]

## 2020-06-05 DIAGNOSIS — F1010 Alcohol abuse, uncomplicated: Secondary | ICD-10-CM

## 2020-06-05 DIAGNOSIS — E1010 Type 1 diabetes mellitus with ketoacidosis without coma: Principal | ICD-10-CM

## 2020-06-05 DIAGNOSIS — G9341 Metabolic encephalopathy: Secondary | ICD-10-CM

## 2020-06-05 LAB — URINALYSIS, ROUTINE W REFLEX MICROSCOPIC
Bacteria, UA: NONE SEEN
Bilirubin Urine: NEGATIVE
Glucose, UA: >=500 mg/dL — AB
Ketones, ur: 80 mg/dL — AB
Leukocytes,Ua: NEGATIVE
Nitrite: NEGATIVE
Protein, ur: 100 mg/dL — AB
Specific Gravity, Urine: 1.02 (ref 1.005–1.030)
pH: 5 (ref 5.0–8.0)

## 2020-06-05 LAB — BLOOD GAS, ARTERIAL
Acid-base deficit: 8.4 mmol/L — ABNORMAL HIGH (ref 0.0–2.0)
Bicarbonate: 15.9 mmol/L — ABNORMAL LOW (ref 20.0–28.0)
FIO2: 21
O2 Saturation: 98.6 %
Patient temperature: 98.2
pCO2 arterial: 30.3 mmHg — ABNORMAL LOW (ref 32.0–48.0)
pH, Arterial: 7.337 — ABNORMAL LOW (ref 7.350–7.450)
pO2, Arterial: 92.5 mmHg (ref 83.0–108.0)

## 2020-06-05 LAB — CBG MONITORING, ED
Glucose-Capillary: 111 mg/dL — ABNORMAL HIGH (ref 70–99)
Glucose-Capillary: 120 mg/dL — ABNORMAL HIGH (ref 70–99)
Glucose-Capillary: 124 mg/dL — ABNORMAL HIGH (ref 70–99)
Glucose-Capillary: 145 mg/dL — ABNORMAL HIGH (ref 70–99)
Glucose-Capillary: 148 mg/dL — ABNORMAL HIGH (ref 70–99)
Glucose-Capillary: 156 mg/dL — ABNORMAL HIGH (ref 70–99)
Glucose-Capillary: 161 mg/dL — ABNORMAL HIGH (ref 70–99)
Glucose-Capillary: 168 mg/dL — ABNORMAL HIGH (ref 70–99)
Glucose-Capillary: 175 mg/dL — ABNORMAL HIGH (ref 70–99)
Glucose-Capillary: 176 mg/dL — ABNORMAL HIGH (ref 70–99)
Glucose-Capillary: 177 mg/dL — ABNORMAL HIGH (ref 70–99)
Glucose-Capillary: 194 mg/dL — ABNORMAL HIGH (ref 70–99)
Glucose-Capillary: 198 mg/dL — ABNORMAL HIGH (ref 70–99)
Glucose-Capillary: 198 mg/dL — ABNORMAL HIGH (ref 70–99)
Glucose-Capillary: 255 mg/dL — ABNORMAL HIGH (ref 70–99)
Glucose-Capillary: 78 mg/dL (ref 70–99)
Glucose-Capillary: 92 mg/dL (ref 70–99)
Glucose-Capillary: 99 mg/dL (ref 70–99)

## 2020-06-05 LAB — BASIC METABOLIC PANEL
Anion gap: 11 (ref 5–15)
Anion gap: 11 (ref 5–15)
Anion gap: 12 (ref 5–15)
Anion gap: 14 (ref 5–15)
BUN: 14 mg/dL (ref 6–20)
BUN: 18 mg/dL (ref 6–20)
BUN: 20 mg/dL (ref 6–20)
BUN: 21 mg/dL — ABNORMAL HIGH (ref 6–20)
CO2: 13 mmol/L — ABNORMAL LOW (ref 22–32)
CO2: 15 mmol/L — ABNORMAL LOW (ref 22–32)
CO2: 17 mmol/L — ABNORMAL LOW (ref 22–32)
CO2: 22 mmol/L (ref 22–32)
Calcium: 5.8 mg/dL — CL (ref 8.9–10.3)
Calcium: 8.7 mg/dL — ABNORMAL LOW (ref 8.9–10.3)
Calcium: 8.7 mg/dL — ABNORMAL LOW (ref 8.9–10.3)
Calcium: 8.8 mg/dL — ABNORMAL LOW (ref 8.9–10.3)
Chloride: 115 mmol/L — ABNORMAL HIGH (ref 98–111)
Chloride: 119 mmol/L — ABNORMAL HIGH (ref 98–111)
Chloride: 121 mmol/L — ABNORMAL HIGH (ref 98–111)
Chloride: 122 mmol/L — ABNORMAL HIGH (ref 98–111)
Creatinine, Ser: 0.6 mg/dL — ABNORMAL LOW (ref 0.61–1.24)
Creatinine, Ser: 0.96 mg/dL (ref 0.61–1.24)
Creatinine, Ser: 1.06 mg/dL (ref 0.61–1.24)
Creatinine, Ser: 1.13 mg/dL (ref 0.61–1.24)
GFR calc Af Amer: >60 mL/min (ref >60)
GFR calc Af Amer: >60 mL/min (ref >60)
GFR calc Af Amer: >60 mL/min (ref >60)
GFR calc Af Amer: >60 mL/min (ref >60)
GFR calc non Af Amer: >60 mL/min (ref >60)
GFR calc non Af Amer: >60 mL/min (ref >60)
GFR calc non Af Amer: >60 mL/min (ref >60)
GFR calc non Af Amer: >60 mL/min (ref >60)
Glucose, Bld: 109 mg/dL — ABNORMAL HIGH (ref 70–99)
Glucose, Bld: 136 mg/dL — ABNORMAL HIGH (ref 70–99)
Glucose, Bld: 206 mg/dL — ABNORMAL HIGH (ref 70–99)
Glucose, Bld: 246 mg/dL — ABNORMAL HIGH (ref 70–99)
Potassium: 2.6 mmol/L — CL (ref 3.5–5.1)
Potassium: 2.7 mmol/L — CL (ref 3.5–5.1)
Potassium: 3.3 mmol/L — ABNORMAL LOW (ref 3.5–5.1)
Potassium: 3.8 mmol/L (ref 3.5–5.1)
Sodium: 147 mmol/L — ABNORMAL HIGH (ref 135–145)
Sodium: 147 mmol/L — ABNORMAL HIGH (ref 135–145)
Sodium: 148 mmol/L — ABNORMAL HIGH (ref 135–145)
Sodium: 150 mmol/L — ABNORMAL HIGH (ref 135–145)

## 2020-06-05 LAB — PHOSPHORUS
Phosphorus: <1 mg/dL — CL (ref 2.5–4.6)
Phosphorus: 1.2 mg/dL — ABNORMAL LOW (ref 2.5–4.6)

## 2020-06-05 LAB — MAGNESIUM: Magnesium: 2 mg/dL (ref 1.7–2.4)

## 2020-06-05 LAB — BETA-HYDROXYBUTYRIC ACID: Beta-Hydroxybutyric Acid: 2.99 mmol/L — ABNORMAL HIGH (ref 0.05–0.27)

## 2020-06-05 MED ORDER — INSULIN ASPART 100 UNIT/ML ~~LOC~~ SOLN
0.0000 [IU] | SUBCUTANEOUS | Status: DC
  Administered 2020-06-05: 2 [IU] via SUBCUTANEOUS
  Administered 2020-06-05: 1 [IU] via SUBCUTANEOUS
  Administered 2020-06-06: 2 [IU] via SUBCUTANEOUS
  Filled 2020-06-05: qty 0.09

## 2020-06-05 MED ORDER — MAGNESIUM SULFATE 4 GM/100ML IV SOLN
4.0000 g | Freq: Once | INTRAVENOUS | Status: AC
  Administered 2020-06-05: 4 g via INTRAVENOUS
  Filled 2020-06-05: qty 100

## 2020-06-05 MED ORDER — SODIUM BICARBONATE-DEXTROSE 150-5 MEQ/L-% IV SOLN
150.0000 meq | INTRAVENOUS | Status: DC
  Administered 2020-06-05: 150 meq via INTRAVENOUS
  Filled 2020-06-05 (×2): qty 1000
  Filled 2020-06-05: qty 150

## 2020-06-05 MED ORDER — FOLIC ACID 5 MG/ML IJ SOLN
1.0000 mg | Freq: Every day | INTRAMUSCULAR | Status: DC
  Administered 2020-06-05: 1 mg via INTRAVENOUS
  Filled 2020-06-05: qty 0.2

## 2020-06-05 MED ORDER — POTASSIUM CHLORIDE CRYS ER 20 MEQ PO TBCR
40.0000 meq | EXTENDED_RELEASE_TABLET | Freq: Once | ORAL | Status: AC
  Administered 2020-06-05: 40 meq via ORAL
  Filled 2020-06-05: qty 2

## 2020-06-05 MED ORDER — SODIUM BICARBONATE-DEXTROSE 150-5 MEQ/L-% IV SOLN
150.0000 meq | INTRAVENOUS | Status: DC
  Filled 2020-06-05: qty 1000

## 2020-06-05 MED ORDER — POTASSIUM PHOSPHATES 15 MMOLE/5ML IV SOLN
30.0000 mmol | Freq: Once | INTRAVENOUS | Status: AC
  Administered 2020-06-05: 30 mmol via INTRAVENOUS
  Filled 2020-06-05: qty 10

## 2020-06-05 MED ORDER — INSULIN GLARGINE 100 UNIT/ML ~~LOC~~ SOLN
18.0000 [IU] | Freq: Every day | SUBCUTANEOUS | Status: DC
  Administered 2020-06-05 – 2020-06-06 (×2): 18 [IU] via SUBCUTANEOUS
  Filled 2020-06-05 (×2): qty 0.18

## 2020-06-05 MED ORDER — POTASSIUM CHLORIDE 10 MEQ/100ML IV SOLN
10.0000 meq | INTRAVENOUS | Status: AC
  Administered 2020-06-06 (×2): 10 meq via INTRAVENOUS
  Filled 2020-06-05: qty 100

## 2020-06-05 MED ORDER — FOLIC ACID 1 MG PO TABS
1.0000 mg | ORAL_TABLET | Freq: Every day | ORAL | Status: DC
  Administered 2020-06-06 – 2020-06-07 (×2): 1 mg via ORAL
  Filled 2020-06-05 (×2): qty 1

## 2020-06-05 MED ORDER — CALCIUM GLUCONATE-NACL 1-0.675 GM/50ML-% IV SOLN
1.0000 g | Freq: Once | INTRAVENOUS | Status: AC
  Administered 2020-06-06: 1000 mg via INTRAVENOUS
  Filled 2020-06-05: qty 50

## 2020-06-05 MED ORDER — POTASSIUM CHLORIDE 10 MEQ/100ML IV SOLN
10.0000 meq | INTRAVENOUS | Status: DC
  Administered 2020-06-05 – 2020-06-06 (×3): 10 meq via INTRAVENOUS
  Filled 2020-06-05 (×2): qty 100

## 2020-06-05 NOTE — ED Notes (Signed)
Date and time results received: 06/05/20 6:58 PM  (use smartphrase ".now" to insert current time)  Test: Potassium  Critical Value: 2.7  Name of Provider Notified: Girguis MD  Orders Received? Or Actions Taken?: None at this time

## 2020-06-05 NOTE — Assessment & Plan Note (Addendum)
-   last drink per patient 2 weeks ago; denies any w/d sxms - d/c CIWA

## 2020-06-05 NOTE — ED Notes (Signed)
Updated given to Mother, Si Raider.

## 2020-06-05 NOTE — Progress Notes (Addendum)
PROGRESS NOTE    Edward HaileyJoshua A Walls   ZOX:[id number]  DOB: 08/10/1981  DOA: 06/04/2020     1  PCP: Mliss SaxKremer, William Alfred, MD  CC: AMS  Hospital Course: Edward Walls is a 39 yo male with PMH extremely poorly controlled diabetes (last A1c 17.2% on 01/28/2020), alcohol use who was brought to the ER for altered mental status and high glucose levels.  He had contacted his mom that he wanted to come to the hospital after feeling progressively worse. After work-up in the ER, he was found to be in severe DKA.  He was started on IV fluids and insulin infusion.   Interval History:  Remains in the ER overnight.  He was extremely lethargic but able to answer questions about his name, where he was, and why.  Still not safe for oral intake but appears to be improved from a mentation standpoint. No family present this morning.  Old records reviewed in assessment of this patient  ROS: Review of systems not obtained due to patient factors.  Extreme lethargy  Assessment & Plan: Diabetes mellitus type 1.5, managed as type 1 (HCC) -Last A1c 17% in April 2021 -Follow-up pending A1c -Once DKA is resolved, will transition back to subcu insulins -Will need ongoing diabetes education.  Suspect he is highly noncompliant  DKA, type 1 (HCC) -Bicarb undetectable on admission, beta hydroxybutyric acid greater than 8, pH 6.87 -Started on insulin drip and appropriate IV fluids -Initiate bicarb drip this morning.  Will de-escalate as able -Repeat ABG this morning -Trend BMP every 4 hours.  Mag and Phos every 8 hours -Keep n.p.o. until more awake and alert and able to safely tolerate food  Alcohol abuse -Monitor on CIWA for any signs of alcohol withdrawal -Continue fluids  Acute metabolic encephalopathy - due to underlying severe DKA; possibly etoh - continue treatment for DKA and monitor; currently improving - continue CIWA  AKI (acute kidney injury) (HCC) - continue IVF - trend  BMP   Antimicrobials: None  DVT prophylaxis: Lovenox Code Status: Full Family Communication: None present Disposition Plan: Home when stable Status is: Inpatient  Remains inpatient appropriate because:Persistent severe electrolyte disturbances, Unsafe d/c plan, IV treatments appropriate due to intensity of illness or inability to take PO and Inpatient level of care appropriate due to severity of illness   Dispo: The patient is from: Home              Anticipated d/c is to: Home              Anticipated d/c date is: 3 days              Patient currently is not medically stable to d/c.       Objective: Blood pressure 138/78, pulse 96, temperature 98.2 F (36.8 C), temperature source Oral, resp. rate 14, height 6' (1.829 m), weight 82.6 kg, SpO2 99 %.  Examination: General appearance: Well-developed young man laying in bed in no distress appearing comfortable but is extremely lethargic Head: Normocephalic, without obvious abnormality, atraumatic Eyes: Pinpoint pupils, EOMI Lungs: clear to auscultation bilaterally Heart: regular rate and rhythm and S1, S2 normal Abdomen: normal findings: bowel sounds normal and soft, non-tender Extremities: No edema Skin: mobility and turgor normal Neurologic: Grossly normal  Consultants:   None  Procedures:   None  Data Reviewed: I have personally reviewed following labs and imaging studies Results for orders placed or performed during the hospital encounter of 06/04/20 (from the past 24 hour(s))  CBG  monitoring, ED     Status: Abnormal   Collection Time: 06/04/20  4:02 PM  Result Value Ref Range   Glucose-Capillary 343 (H) 70 - 99 mg/dL  CBG monitoring, ED     Status: Abnormal   Collection Time: 06/04/20  5:40 PM  Result Value Ref Range   Glucose-Capillary 240 (H) 70 - 99 mg/dL  CBG monitoring, ED     Status: Abnormal   Collection Time: 06/04/20  7:23 PM  Result Value Ref Range   Glucose-Capillary 261 (H) 70 - 99 mg/dL   Beta-hydroxybutyric acid     Status: Abnormal   Collection Time: 06/04/20  7:44 PM  Result Value Ref Range   Beta-Hydroxybutyric Acid 5.39 (H) 0.05 - 0.27 mmol/L  Basic metabolic panel     Status: Abnormal   Collection Time: 06/04/20  7:44 PM  Result Value Ref Range   Sodium 146 (H) 135 - 145 mmol/L   Potassium 3.6 3.5 - 5.1 mmol/L   Chloride 122 (H) 98 - 111 mmol/L   CO2 8 (L) 22 - 32 mmol/L   Glucose, Bld 290 (H) 70 - 99 mg/dL   BUN 17 6 - 20 mg/dL   Creatinine, Ser 4.31 0.61 - 1.24 mg/dL   Calcium 8.2 (L) 8.9 - 10.3 mg/dL   GFR calc non Af Amer >60 >60 mL/min   GFR calc Af Amer >60 >60 mL/min   Anion gap 16 (H) 5 - 15  CBG monitoring, ED     Status: Abnormal   Collection Time: 06/04/20  8:35 PM  Result Value Ref Range   Glucose-Capillary 226 (H) 70 - 99 mg/dL  CBG monitoring, ED     Status: Abnormal   Collection Time: 06/04/20  9:39 PM  Result Value Ref Range   Glucose-Capillary 205 (H) 70 - 99 mg/dL  CBG monitoring, ED     Status: Abnormal   Collection Time: 06/04/20 10:32 PM  Result Value Ref Range   Glucose-Capillary 173 (H) 70 - 99 mg/dL  CBG monitoring, ED     Status: Abnormal   Collection Time: 06/05/20 12:00 AM  Result Value Ref Range   Glucose-Capillary 198 (H) 70 - 99 mg/dL  Basic metabolic panel     Status: Abnormal   Collection Time: 06/05/20 12:10 AM  Result Value Ref Range   Sodium 147 (H) 135 - 145 mmol/L   Potassium 3.8 3.5 - 5.1 mmol/L   Chloride 122 (H) 98 - 111 mmol/L   CO2 13 (L) 22 - 32 mmol/L   Glucose, Bld 246 (H) 70 - 99 mg/dL   BUN 18 6 - 20 mg/dL   Creatinine, Ser 5.40 0.61 - 1.24 mg/dL   Calcium 8.7 (L) 8.9 - 10.3 mg/dL   GFR calc non Af Amer >60 >60 mL/min   GFR calc Af Amer >60 >60 mL/min   Anion gap 12 5 - 15  CBG monitoring, ED     Status: Abnormal   Collection Time: 06/05/20  1:10 AM  Result Value Ref Range   Glucose-Capillary 148 (H) 70 - 99 mg/dL  CBG monitoring, ED     Status: Abnormal   Collection Time: 06/05/20  2:47 AM   Result Value Ref Range   Glucose-Capillary 120 (H) 70 - 99 mg/dL  CBG monitoring, ED     Status: Abnormal   Collection Time: 06/05/20  3:49 AM  Result Value Ref Range   Glucose-Capillary 145 (H) 70 - 99 mg/dL  Basic metabolic panel  Status: Abnormal   Collection Time: 06/05/20  4:31 AM  Result Value Ref Range   Sodium 142 135 - 145 mmol/L   Potassium 3.7 3.5 - 5.1 mmol/L   Chloride 117 (H) 98 - 111 mmol/L   CO2 12 (L) 22 - 32 mmol/L   Glucose, Bld 1,476 (HH) 70 - 99 mg/dL   BUN 14 6 - 20 mg/dL   Creatinine, Ser 3.00 0.61 - 1.24 mg/dL   Calcium 7.5 (L) 8.9 - 10.3 mg/dL   GFR calc non Af Amer >60 >60 mL/min   GFR calc Af Amer >60 >60 mL/min   Anion gap 13 5 - 15  Lipase, blood     Status: Abnormal   Collection Time: 06/05/20  4:31 AM  Result Value Ref Range   Lipase 54 (H) 11 - 51 U/L  Magnesium     Status: Abnormal   Collection Time: 06/05/20  4:31 AM  Result Value Ref Range   Magnesium 1.3 (L) 1.7 - 2.4 mg/dL  CBG monitoring, ED     Status: None   Collection Time: 06/05/20  4:56 AM  Result Value Ref Range   Glucose-Capillary 78 70 - 99 mg/dL  CBG monitoring, ED     Status: Abnormal   Collection Time: 06/05/20  5:29 AM  Result Value Ref Range   Glucose-Capillary 168 (H) 70 - 99 mg/dL  CBG monitoring, ED     Status: Abnormal   Collection Time: 06/05/20  6:29 AM  Result Value Ref Range   Glucose-Capillary 161 (H) 70 - 99 mg/dL  CBG monitoring, ED     Status: Abnormal   Collection Time: 06/05/20  7:18 AM  Result Value Ref Range   Glucose-Capillary 194 (H) 70 - 99 mg/dL  Blood gas, arterial     Status: Abnormal   Collection Time: 06/05/20  7:42 AM  Result Value Ref Range   FIO2 21.00    pH, Arterial 7.337 (L) 7.35 - 7.45   pCO2 arterial 30.3 (L) 32 - 48 mmHg   pO2, Arterial 92.5 83 - 108 mmHg   Bicarbonate 15.9 (L) 20.0 - 28.0 mmol/L   Acid-base deficit 8.4 (H) 0.0 - 2.0 mmol/L   O2 Saturation 98.6 %   Patient temperature 98.2    Allens test (pass/fail) PASS  PASS  Beta-hydroxybutyric acid     Status: Abnormal   Collection Time: 06/05/20  7:48 AM  Result Value Ref Range   Beta-Hydroxybutyric Acid 2.99 (H) 0.05 - 0.27 mmol/L  Basic metabolic panel     Status: Abnormal   Collection Time: 06/05/20  7:48 AM  Result Value Ref Range   Sodium 150 (H) 135 - 145 mmol/L   Potassium 3.3 (L) 3.5 - 5.1 mmol/L   Chloride 119 (H) 98 - 111 mmol/L   CO2 17 (L) 22 - 32 mmol/L   Glucose, Bld 206 (H) 70 - 99 mg/dL   BUN 21 (H) 6 - 20 mg/dL   Creatinine, Ser 9.23 0.61 - 1.24 mg/dL   Calcium 8.8 (L) 8.9 - 10.3 mg/dL   GFR calc non Af Amer >60 >60 mL/min   GFR calc Af Amer >60 >60 mL/min   Anion gap 14 5 - 15  Phosphorus     Status: Abnormal   Collection Time: 06/05/20  8:00 AM  Result Value Ref Range   Phosphorus <1.0 (LL) 2.5 - 4.6 mg/dL  CBG monitoring, ED     Status: Abnormal   Collection Time: 06/05/20  9:10 AM  Result  Value Ref Range   Glucose-Capillary 177 (H) 70 - 99 mg/dL  CBG monitoring, ED     Status: Abnormal   Collection Time: 06/05/20 10:15 AM  Result Value Ref Range   Glucose-Capillary 175 (H) 70 - 99 mg/dL  CBG monitoring, ED     Status: Abnormal   Collection Time: 06/05/20 11:32 AM  Result Value Ref Range   Glucose-Capillary 176 (H) 70 - 99 mg/dL  CBG monitoring, ED     Status: Abnormal   Collection Time: 06/05/20  2:39 PM  Result Value Ref Range   Glucose-Capillary 198 (H) 70 - 99 mg/dL    Recent Results (from the past 240 hour(s))  SARS Coronavirus 2 by RT PCR (hospital order, performed in [facility] hospital lab) Nasopharyngeal Nasopharyngeal Swab     Status: None   Collection Time: 06/04/20 10:05 AM   Specimen: Nasopharyngeal Swab  Result Value Ref Range Status   SARS Coronavirus 2 NEGATIVE NEGATIVE Final    Comment: (NOTE) SARS-CoV-2 target nucleic acids are NOT DETECTED.  The SARS-CoV-2 RNA is generally detectable in upper and lower respiratory specimens during the acute phase of infection. The lowest concentration of  SARS-CoV-2 viral copies this assay can detect is 250 copies / mL. A negative result does not preclude SARS-CoV-2 infection and should not be used as the sole basis for treatment or other patient management decisions.  A negative result may occur with improper specimen collection / handling, submission of specimen other than nasopharyngeal swab, presence of viral mutation(s) within the areas targeted by this assay, and inadequate number of viral copies (<250 copies / mL). A negative result must be combined with clinical observations, patient history, and epidemiological information.  Fact Sheet for Patients:   BoilerBrush.com.cy  Fact Sheet for Healthcare Providers: https://pope.com/  This test is not yet approved or  cleared by the [place] FDA and has been authorized for detection and/or diagnosis of SARS-CoV-2 by FDA under an Emergency Use Authorization (EUA).  This EUA will remain in effect (meaning this test can be used) for the duration of the COVID-19 declaration under Section 564(b)(1) of the Act, 21 U.S.C. section 360bbb-3(b)(1), unless the authorization is terminated or revoked sooner.  Performed at [facility], [address] [place], [place] [address]      Radiology Studies: CT Head Wo Contrast  Result Date: 06/04/2020 CLINICAL DATA:  Altered mental status EXAM: CT HEAD WITHOUT CONTRAST TECHNIQUE: Contiguous axial images were obtained from the base of the skull through the vertex without intravenous contrast. COMPARISON:  10/14/2018 FINDINGS: Brain: No evidence of acute infarction, hemorrhage, hydrocephalus, extra-axial collection or mass lesion/mass effect. Vascular: No hyperdense vessel or unexpected calcification. Skull: Normal. Negative for fracture or focal lesion. Sinuses/Orbits: No acute finding. Other: None. IMPRESSION: No acute intracranial findings. Electronically Signed   By: Duanne Guess D.O.   On: 06/04/2020 10:20   DG Chest Portable 1 View  Result Date: 06/04/2020 CLINICAL DATA:  Shortness of breath and tachypnea EXAM: PORTABLE CHEST 1 VIEW COMPARISON:  October 14, 2018 FINDINGS: Lungs are clear. Heart size and pulmonary vascularity are normal. No adenopathy. There is upper thoracic levoscoliosis. IMPRESSION: Lungs clear.  Cardiac silhouette normal. Electronically Signed   By: Bretta Bang III M.D.   On: 06/04/2020 09:59   CT Head Wo Contrast  Final Result    DG Chest Portable 1 View  Final Result       Scheduled Meds: . enoxaparin (LOVENOX) injection  40 mg Subcutaneous  Q24H  . folic acid  1 mg Intravenous Daily  . multivitamin with minerals  1 tablet Oral Daily  . thiamine  100 mg Oral Daily   Or  . thiamine  100 mg Intravenous Daily   PRN Meds: dextrose, labetalol, LORazepam **OR** LORazepam Continuous Infusions: . dextrose 5% lactated ringers Stopped (06/05/20 1005)  . insulin 5.5 Units/hr (06/05/20 1440)  . lactated ringers Stopped (06/04/20 1745)  . potassium PHOSPHATE IVPB (in mmol) 30 mmol (06/05/20 1013)  . sodium bicarbonate 150 mEq in dextrose 5% 1000 mL 150 mEq (06/05/20 0836)      LOS: 1 day  Time spent: Greater than 50% of the 35 minute visit was spent in counseling/coordination of care for the patient as laid out in the A&P.   Lewie Chamber, MD [facility] 06/05/2020, 2:54 PM   Contact via secure chat.  To contact the attending provider between 7A-7P or the covering provider during after hours 7P-7A, please log into the web site [email or web address] and access using universal [place] password for that web site. If you do not have the password, please call the hospital operator.

## 2020-06-05 NOTE — Hospital Course (Addendum)
Edward Walls is a 39 yo male with PMH extremely poorly controlled diabetes (last A1c 17.2% on 01/28/2020), alcohol use who was brought to the ER for altered mental status and high glucose levels.  He had contacted his mom that he wanted to come to the hospital after feeling progressively worse. After work-up in the ER, he was found to be in severe DKA.  He was started on IV fluids and insulin infusion. He responded to treatment and his DKA resolved. He was started on a carb consistent diet and resumed on SQ insulins.  He was counseled bedside regarding better compliance and control of his DM as well as to reduce his alcohol consumption.   He continued to stable and tolerated diet.  His insulin regimen was adjusted for his glucose levels.  He was relatively well controlled with Lantus regimen of 18 to 20 units during hospitalization, likely would be optimal on a Lantus dose around 25 units daily.  He states that he is on 40 units daily of Tresiba at home with average CBGs around 250-280.  Rather than make any further adjustments given the discrepancy, he was recommended to follow-up with endocrinology outpatient and discuss any further regimen changes.  He also voiced interest in getting back on a CGM if possible. Glucose levels were stable at time of discharge and patient was alert, oriented, and tolerating diet well.

## 2020-06-05 NOTE — ED Notes (Signed)
Date and time results received: 06/05/20 11:26 PM (use smartphrase ".now" to insert current time)  Test: Calcium  Critical Value: 5.8  Name of Provider Notified: Anna Genre, NP  Orders Received? Or Actions Taken?: none at this time

## 2020-06-05 NOTE — Assessment & Plan Note (Addendum)
-   due to underlying severe DKA; possibly etoh - now back to normal baseline - continue routine monitoring

## 2020-06-05 NOTE — Assessment & Plan Note (Addendum)
-  Last A1c 17% in April 2021 -Follow-up pending A1c: ">15.5%" - patient states he takes 40 units Levemir at home; currently on 18units lantus and tolerating well.  Increased Lantus to 20 units daily. -Given the discrepancy between home dosing and good control during hospitalization on lower basal dose, recommended he discuss this with endocrinology at discharge, he voiced understanding

## 2020-06-05 NOTE — ED Notes (Signed)
Date and time results received: 06/05/20 8:51 AM  (use smartphrase ".now" to insert current time)  Test: Phosphorus  Critical Value: <1.0  Name of Provider Notified: Notified Primary RN Linus Galas  Orders Received? Or Actions Taken?: Actions Taken: Notifed Katie W

## 2020-06-05 NOTE — Assessment & Plan Note (Addendum)
-  Bicarb undetectable on admission, beta hydroxybutyric acid greater than 8, pH 6.87 -Started on insulin drip and appropriate IV fluids - s/p bicarb drip; ABG monitored - now with DKA resolved, will de-escalate treatment, see DM

## 2020-06-05 NOTE — Progress Notes (Addendum)
Inpatient Diabetes Program Recommendations  AACE/ADA: New Consensus Statement on Inpatient Glycemic Control (2015)  Target Ranges:  Prepandial:   less than 140 mg/dL      Peak postprandial:   less than 180 mg/dL (1-2 hours)      Critically ill patients:  140 - 180 mg/dL   Lab Results  Component Value Date   GLUCAP 177 (H) 06/05/2020   HGBA1C 17.2 (H) 01/28/2020    Review of Glycemic Control Results for Edward Walls, Edward Walls (MRN [id number]) as of 06/05/2020 10:10  Ref. Range 06/05/2020 07:48  Potassium Latest Ref Range: 3.5 - 5.1 mmol/L 3.3 (L)  Chloride Latest Ref Range: 98 - 111 mmol/L 119 (H)  CO2 Latest Ref Range: 22 - 32 mmol/L 17 (L)  Glucose Latest Ref Range: 70 - 99 mg/dL 206 (H)  BUN Latest Ref Range: 6 - 20 mg/dL 21 (H)  Creatinine Latest Ref Range: 0.61 - 1.24 mg/dL 1.13  Calcium Latest Ref Range: 8.9 - 10.3 mg/dL 8.8 (L)  Anion gap Latest Ref Range: 5 - 15  14  Results for Edward Walls, Edward Walls (MRN [id number]) as of 06/05/2020 10:10  Ref. Range 06/05/2020 07:48  Beta-Hydroxybutyric Acid Latest Ref Range: 0.05 - 0.27 mmol/L 2.99 (H)   Diabetes history: DM1 Outpatient Diabetes medications: Tresiba 40 units QD, Lispro aabc 12-14 units tidwc Current orders for Inpatient glycemic control: IV insulin per [facility] for DKA  HgbA1C - 17.2%, glucose on presentation 1,476  Inpatient Diabetes Program Recommendations:     -  Supplement Potassium  -  Continue IV insulin per [facility] until criteria met for transitioning to SQ (possibly another 8 hours).  Will need to transition close to home regimen as pt has Type 1 Dm and will require basal insulin in addition to Novolog Correction + meal coverage when eating.  Note pt follow with Dr. Cruzita Lederer, Endocrinologist, outpatient and has been counseled and educated on why and how to give his insulins consistently. Last visit recently on 03/23/20. She noted noncompliance to diet, exercise, and medications. She wanted follow up in the next 1.5  months.   -  Have pt follow up with Dr. Cruzita Lederer with [facility] within the next 1-2 weeks due to DKA admission.  Thank you. Tama Headings RN, MSN, BC-ADM Inpatient Diabetes Coordinator Team Pager [phone] (8a-5p)

## 2020-06-05 NOTE — ED Notes (Signed)
Date and time results received: 06/05/20 0713 (use smartphrase ".now" to insert current time)  Test: Glucose Critical Value: 1476  Name of Provider Notified: Dr. Frederick Peers  Orders Received? Or Actions Taken?: Orders Received - See Orders for details

## 2020-06-05 NOTE — Assessment & Plan Note (Addendum)
-   resolved with IVF - trend BMP

## 2020-06-05 NOTE — ED Notes (Signed)
Floor coverage contacted regarding pts current BMP. Will reassess following next BMP.

## 2020-06-05 NOTE — ED Notes (Signed)
Date and time results received: 06/05/20 11:27 PM  (use smartphrase ".now" to insert current time)  Test: Potassium Critical Value: 2.6  Name of Provider Notified: [place]  Orders Received? Or Actions Taken?: none at this time

## 2020-06-05 NOTE — ED Notes (Signed)
Dr. Frederick Peers paged regarding Critical Lab Value - Phosphorus <1.0.  Endotool suggesting subq insulin administration.  Waiting for return call.

## 2020-06-06 DIAGNOSIS — E139 Other specified diabetes mellitus without complications: Secondary | ICD-10-CM

## 2020-06-06 DIAGNOSIS — E876 Hypokalemia: Secondary | ICD-10-CM

## 2020-06-06 LAB — CBC WITH DIFFERENTIAL/PLATELET
Abs Immature Granulocytes: 0.01 10*3/uL (ref 0.00–0.07)
Basophils Absolute: 0 10*3/uL (ref 0.0–0.1)
Basophils Relative: 0 %
Eosinophils Absolute: 0 10*3/uL (ref 0.0–0.5)
Eosinophils Relative: 0 %
HCT: 37.4 % — ABNORMAL LOW (ref 39.0–52.0)
Hemoglobin: 12.8 g/dL — ABNORMAL LOW (ref 13.0–17.0)
Immature Granulocytes: 0 %
Lymphocytes Relative: 29 %
Lymphs Abs: 1.8 10*3/uL (ref 0.7–4.0)
MCH: 27.8 pg (ref 26.0–34.0)
MCHC: 34.2 g/dL (ref 30.0–36.0)
MCV: 81.3 fL (ref 80.0–100.0)
Monocytes Absolute: 0.7 10*3/uL (ref 0.1–1.0)
Monocytes Relative: 11 %
Neutro Abs: 3.7 10*3/uL (ref 1.7–7.7)
Neutrophils Relative %: 60 %
Platelets: 163 10*3/uL (ref 150–400)
RBC: 4.6 MIL/uL (ref 4.22–5.81)
RDW: 13.8 % (ref 11.5–15.5)
WBC: 6.2 10*3/uL (ref 4.0–10.5)
nRBC: 0 % (ref 0.0–0.2)

## 2020-06-06 LAB — MAGNESIUM
Magnesium: 2.8 mg/dL — ABNORMAL HIGH (ref 1.7–2.4)
Magnesium: 2.2 mg/dL (ref 1.7–2.4)

## 2020-06-06 LAB — BASIC METABOLIC PANEL
Anion gap: 12 (ref 5–15)
Anion gap: 13 (ref 5–15)
Anion gap: 17 — ABNORMAL HIGH (ref 5–15)
BUN: 18 mg/dL (ref 6–20)
BUN: 18 mg/dL (ref 6–20)
BUN: 15 mg/dL (ref 6–20)
CO2: 19 mmol/L — ABNORMAL LOW (ref 22–32)
CO2: 19 mmol/L — ABNORMAL LOW (ref 22–32)
CO2: 22 mmol/L (ref 22–32)
Calcium: 8.1 mg/dL — ABNORMAL LOW (ref 8.9–10.3)
Calcium: 7.9 mg/dL — ABNORMAL LOW (ref 8.9–10.3)
Calcium: 8.6 mg/dL — ABNORMAL LOW (ref 8.9–10.3)
Chloride: 112 mmol/L — ABNORMAL HIGH (ref 98–111)
Chloride: 109 mmol/L (ref 98–111)
Chloride: 103 mmol/L (ref 98–111)
Creatinine, Ser: 0.84 mg/dL (ref 0.61–1.24)
Creatinine, Ser: 0.97 mg/dL (ref 0.61–1.24)
Creatinine, Ser: 0.94 mg/dL (ref 0.61–1.24)
GFR calc Af Amer: >60 mL/min (ref >60)
GFR calc Af Amer: >60 mL/min (ref >60)
GFR calc Af Amer: >60 mL/min (ref >60)
GFR calc non Af Amer: >60 mL/min (ref >60)
GFR calc non Af Amer: >60 mL/min (ref >60)
GFR calc non Af Amer: >60 mL/min (ref >60)
Glucose, Bld: 183 mg/dL — ABNORMAL HIGH (ref 70–99)
Glucose, Bld: 221 mg/dL — ABNORMAL HIGH (ref 70–99)
Glucose, Bld: 241 mg/dL — ABNORMAL HIGH (ref 70–99)
Potassium: 3.2 mmol/L — ABNORMAL LOW (ref 3.5–5.1)
Potassium: 3 mmol/L — ABNORMAL LOW (ref 3.5–5.1)
Potassium: 2.7 mmol/L — CL (ref 3.5–5.1)
Sodium: 143 mmol/L (ref 135–145)
Sodium: 141 mmol/L (ref 135–145)
Sodium: 142 mmol/L (ref 135–145)

## 2020-06-06 LAB — GLUCOSE, CAPILLARY
Glucose-Capillary: 146 mg/dL — ABNORMAL HIGH (ref 70–99)
Glucose-Capillary: 189 mg/dL — ABNORMAL HIGH (ref 70–99)
Glucose-Capillary: 364 mg/dL — ABNORMAL HIGH (ref 70–99)
Glucose-Capillary: 347 mg/dL — ABNORMAL HIGH (ref 70–99)
Glucose-Capillary: 271 mg/dL — ABNORMAL HIGH (ref 70–99)
Glucose-Capillary: 323 mg/dL — ABNORMAL HIGH (ref 70–99)

## 2020-06-06 LAB — PHOSPHORUS: Phosphorus: 1.2 mg/dL — ABNORMAL LOW (ref 2.5–4.6)

## 2020-06-06 LAB — MRSA PCR SCREENING: MRSA by PCR: NEGATIVE

## 2020-06-06 LAB — LIPASE, BLOOD

## 2020-06-06 MED ORDER — CALCIUM GLUCONATE-NACL 2-0.675 GM/100ML-% IV SOLN
2.0000 g | Freq: Once | INTRAVENOUS | Status: AC
  Administered 2020-06-06: 2000 mg via INTRAVENOUS
  Filled 2020-06-06: qty 100

## 2020-06-06 MED ORDER — POTASSIUM CHLORIDE 10 MEQ/100ML IV SOLN
10.0000 meq | INTRAVENOUS | Status: AC
  Administered 2020-06-06 – 2020-06-07 (×6): 10 meq via INTRAVENOUS
  Filled 2020-06-06 (×6): qty 100

## 2020-06-06 MED ORDER — ACETAMINOPHEN 325 MG PO TABS
650.0000 mg | ORAL_TABLET | Freq: Four times a day (QID) | ORAL | Status: DC | PRN
  Administered 2020-06-06: 650 mg via ORAL
  Filled 2020-06-06: qty 2

## 2020-06-06 MED ORDER — ONDANSETRON 4 MG PO TBDP: 4.0000 mg | ORAL_TABLET | ORAL | Status: DC | PRN

## 2020-06-06 MED ORDER — INSULIN ASPART 100 UNIT/ML ~~LOC~~ SOLN
0.0000 [IU] | SUBCUTANEOUS | Status: DC
  Administered 2020-06-06: 1 [IU] via SUBCUTANEOUS
  Administered 2020-06-06: 2 [IU] via SUBCUTANEOUS

## 2020-06-06 MED ORDER — CHLORHEXIDINE GLUCONATE CLOTH 2 % EX PADS
6.0000 | MEDICATED_PAD | Freq: Every day | CUTANEOUS | Status: DC
  Administered 2020-06-06: 6 via TOPICAL

## 2020-06-06 MED ORDER — ONDANSETRON HCL 4 MG/2ML IJ SOLN
4.0000 mg | INTRAMUSCULAR | Status: DC | PRN
  Administered 2020-06-06: 4 mg via INTRAVENOUS
  Filled 2020-06-06: qty 2

## 2020-06-06 MED ORDER — POTASSIUM PHOSPHATES 15 MMOLE/5ML IV SOLN
30.0000 mmol | Freq: Once | INTRAVENOUS | Status: AC
  Administered 2020-06-06: 30 mmol via INTRAVENOUS
  Filled 2020-06-06: qty 10

## 2020-06-06 MED ORDER — MAGNESIUM SULFATE 2 GM/50ML IV SOLN: 2.0000 g | Freq: Once | INTRAVENOUS | Status: DC

## 2020-06-06 MED ORDER — INSULIN ASPART 100 UNIT/ML ~~LOC~~ SOLN
0.0000 [IU] | Freq: Three times a day (TID) | SUBCUTANEOUS | Status: DC
  Administered 2020-06-06: 7 [IU] via SUBCUTANEOUS
  Administered 2020-06-06: 9 [IU] via SUBCUTANEOUS
  Administered 2020-06-06: 7 [IU] via SUBCUTANEOUS
  Administered 2020-06-07: 5 [IU] via SUBCUTANEOUS

## 2020-06-06 NOTE — Assessment & Plan Note (Signed)
-   replete and recheck as needed 

## 2020-06-06 NOTE — Progress Notes (Signed)
Per verbal order from [facility], RN will continue order for 5 potassium runs and disregard the order for 6 potassium runs.

## 2020-06-06 NOTE — Plan of Care (Signed)
  Problem: Cardiac: Goal: Ability to maintain an adequate cardiac output will improve Outcome: Completed/Met   Problem: Fluid Volume: Goal: Ability to achieve a balanced intake and output will improve Outcome: Completed/Met

## 2020-06-06 NOTE — Progress Notes (Signed)
PROGRESS NOTE    Edward Walls   CVE:[id number]  DOB: 1981-03-15  DOA: 06/04/2020     2  PCP: Mliss Sax, MD  CC: AMS  Hospital Course: Mr. Detjen is a 39 yo male with PMH extremely poorly controlled diabetes (last A1c 17.2% on 01/28/2020), alcohol use who was brought to the ER for altered mental status and high glucose levels.  He had contacted his mom that he wanted to come to the hospital after feeling progressively worse. After work-up in the ER, he was found to be in severe DKA.  He was started on IV fluids and insulin infusion. He responded to treatment and his DKA resolved. He was started on a carb consistent diet and resumed on SQ insulins.  He was counseled bedside regarding better compliance and control of his DM as well as to reduce his alcohol consumption.    Interval History:  Transferred to SDU yesterday. No events overnight. He is much better this morning and is fully awake, alert, and answering all questions. He has an appetite and wishes to eat. He does say he takes his insulin at home but also has a hard time explaining why lack of DM control.  He says his last drink was 2 weekends ago and denies any tremors, agitation, anxiety, etc.   Old records reviewed in assessment of this patient  ROS: Constitutional: negative for chills, fatigue and fevers, Respiratory: negative for cough, Cardiovascular: negative for chest pain and Gastrointestinal: negative for abdominal pain   Assessment & Plan: Diabetes mellitus type 1.5, managed as type 1 (HCC) -Last A1c 17% in April 2021 -Follow-up pending A1c - patient states he takes 40 units Levemir at home; currently on 18units lantus and tolerating well - advance diet to CC and monitor CBGs - change SSI to ACHS -Will need ongoing diabetes education.  Suspect he is highly noncompliant  DKA, type 1 (HCC) -Bicarb undetectable on admission, beta hydroxybutyric acid greater than 8, pH 6.87 -Started on insulin drip  and appropriate IV fluids - s/p bicarb drip; ABG monitored - now with DKA resolved, will de-escalate treatment, see DM  Alcohol abuse - last drink per patient 2 weeks ago; denies any w/d sxms - d/c CIWA  Acute metabolic encephalopathy - due to underlying severe DKA; possibly etoh - now back to normal baseline - continue routine monitoring   AKI (acute kidney injury) (HCC) - resolved with IVF - trend BMP  Hypokalemia - replete and recheck as needed  Hypophosphatemia - replete and recheck as needed  Hypomagnesemia - replete and recheck as needed  Hypocalcemia - replete and recheck as needed   Antimicrobials: None  DVT prophylaxis: Lovenox Code Status: Full Family Communication: None present Disposition Plan: Home when stable Status is: Inpatient  Remains inpatient appropriate because:Persistent severe electrolyte disturbances, Unsafe d/c plan, IV treatments appropriate due to intensity of illness or inability to take PO and Inpatient level of care appropriate due to severity of illness   Dispo: The patient is from: Home              Anticipated d/c is to: Home              Anticipated d/c date is: 2 days              Patient currently is not medically stable to d/c.   Objective: Blood pressure 130/81, pulse 85, temperature 97.8 F (36.6 C), temperature source Axillary, resp. rate 12, height 6' (1.829 m), weight 82.6  kg, SpO2 100 %.  Examination: General appearance: alert, cooperative and no distress Head: Normocephalic, without obvious abnormality, atraumatic Eyes: EOMI Lungs: clear to auscultation bilaterally Heart: regular rate and rhythm and S1, S2 normal Abdomen: normal findings: bowel sounds normal and soft, non-tender Extremities: No edema Skin: mobility and turgor normal Neurologic: Grossly normal  Consultants:   None  Procedures:   None  Data Reviewed: I have personally reviewed following labs and imaging studies Results for orders placed  or performed during the hospital encounter of 06/04/20 (from the past 24 hour(s))  CBG monitoring, ED     Status: Abnormal   Collection Time: 06/05/20  9:10 AM  Result Value Ref Range   Glucose-Capillary 177 (H) 70 - 99 mg/dL  CBG monitoring, ED     Status: Abnormal   Collection Time: 06/05/20 10:15 AM  Result Value Ref Range   Glucose-Capillary 175 (H) 70 - 99 mg/dL  CBG monitoring, ED     Status: Abnormal   Collection Time: 06/05/20 11:32 AM  Result Value Ref Range   Glucose-Capillary 176 (H) 70 - 99 mg/dL  CBG monitoring, ED     Status: Abnormal   Collection Time: 06/05/20  2:39 PM  Result Value Ref Range   Glucose-Capillary 198 (H) 70 - 99 mg/dL  CBG monitoring, ED     Status: Abnormal   Collection Time: 06/05/20  4:09 PM  Result Value Ref Range   Glucose-Capillary 255 (H) 70 - 99 mg/dL  CBG monitoring, ED     Status: None   Collection Time: 06/05/20  5:44 PM  Result Value Ref Range   Glucose-Capillary 92 70 - 99 mg/dL  CBG monitoring, ED     Status: None   Collection Time: 06/05/20  5:47 PM  Result Value Ref Range   Glucose-Capillary 99 70 - 99 mg/dL  Basic metabolic panel     Status: Abnormal   Collection Time: 06/05/20  5:51 PM  Result Value Ref Range   Sodium 148 (H) 135 - 145 mmol/L   Potassium 2.7 (LL) 3.5 - 5.1 mmol/L   Chloride 115 (H) 98 - 111 mmol/L   CO2 22 22 - 32 mmol/L   Glucose, Bld 109 (H) 70 - 99 mg/dL   BUN 20 6 - 20 mg/dL   Creatinine, Ser 7.51 0.61 - 1.24 mg/dL   Calcium 8.7 (L) 8.9 - 10.3 mg/dL   GFR calc non Af Amer >60 >60 mL/min   GFR calc Af Amer >60 >60 mL/min   Anion gap 11 5 - 15  Magnesium     Status: None   Collection Time: 06/05/20  5:51 PM  Result Value Ref Range   Magnesium 2.0 1.7 - 2.4 mg/dL  Phosphorus     Status: Abnormal   Collection Time: 06/05/20  5:51 PM  Result Value Ref Range   Phosphorus 1.2 (L) 2.5 - 4.6 mg/dL  CBG monitoring, ED     Status: Abnormal   Collection Time: 06/05/20  6:26 PM  Result Value Ref Range    Glucose-Capillary 111 (H) 70 - 99 mg/dL  CBG monitoring, ED     Status: Abnormal   Collection Time: 06/05/20  8:19 PM  Result Value Ref Range   Glucose-Capillary 156 (H) 70 - 99 mg/dL  CBG monitoring, ED     Status: Abnormal   Collection Time: 06/05/20 10:15 PM  Result Value Ref Range   Glucose-Capillary 124 (H) 70 - 99 mg/dL  Basic metabolic panel     Status:  Abnormal   Collection Time: 06/05/20 10:16 PM  Result Value Ref Range   Sodium 147 (H) 135 - 145 mmol/L   Potassium 2.6 (LL) 3.5 - 5.1 mmol/L   Chloride 121 (H) 98 - 111 mmol/L   CO2 15 (L) 22 - 32 mmol/L   Glucose, Bld 136 (H) 70 - 99 mg/dL   BUN 14 6 - 20 mg/dL   Creatinine, Ser 4.090.60 (L) 0.61 - 1.24 mg/dL   Calcium 5.8 (LL) 8.9 - 10.3 mg/dL   GFR calc non Af Amer >60 >60 mL/min   GFR calc Af Amer >60 >60 mL/min   Anion gap 11 5 - 15  Magnesium     Status: Abnormal   Collection Time: 06/06/20  2:29 AM  Result Value Ref Range   Magnesium 2.8 (H) 1.7 - 2.4 mg/dL  Phosphorus     Status: Abnormal   Collection Time: 06/06/20  2:29 AM  Result Value Ref Range   Phosphorus 1.2 (L) 2.5 - 4.6 mg/dL  CBC with Differential/Platelet     Status: Abnormal   Collection Time: 06/06/20  2:29 AM  Result Value Ref Range   WBC 6.2 4.0 - 10.5 K/uL   RBC 4.60 4.22 - 5.81 MIL/uL   Hemoglobin 12.8 (L) 13.0 - 17.0 g/dL   HCT 81.137.4 (L) 39 - 52 %   MCV 81.3 80.0 - 100.0 fL   MCH 27.8 26.0 - 34.0 pg   MCHC 34.2 30.0 - 36.0 g/dL   RDW 91.413.8 78.211.5 - 95.615.5 %   Platelets 163 150 - 400 K/uL   nRBC 0.0 0.0 - 0.2 %   Neutrophils Relative % 60 %   Neutro Abs 3.7 1.7 - 7.7 K/uL   Lymphocytes Relative 29 %   Lymphs Abs 1.8 0.7 - 4.0 K/uL   Monocytes Relative 11 %   Monocytes Absolute 0.7 0 - 1 K/uL   Eosinophils Relative 0 %   Eosinophils Absolute 0.0 0 - 0 K/uL   Basophils Relative 0 %   Basophils Absolute 0.0 0 - 0 K/uL   Immature Granulocytes 0 %   Abs Immature Granulocytes 0.01 0.00 - 0.07 K/uL  Basic metabolic panel     Status: Abnormal    Collection Time: 06/06/20  2:29 AM  Result Value Ref Range   Sodium 143 135 - 145 mmol/L   Potassium 3.2 (L) 3.5 - 5.1 mmol/L   Chloride 112 (H) 98 - 111 mmol/L   CO2 19 (L) 22 - 32 mmol/L   Glucose, Bld 183 (H) 70 - 99 mg/dL   BUN 18 6 - 20 mg/dL   Creatinine, Ser 2.130.84 0.61 - 1.24 mg/dL   Calcium 8.1 (L) 8.9 - 10.3 mg/dL   GFR calc non Af Amer >60 >60 mL/min   GFR calc Af Amer >60 >60 mL/min   Anion gap 12 5 - 15  Glucose, capillary     Status: Abnormal   Collection Time: 06/06/20  3:22 AM  Result Value Ref Range   Glucose-Capillary 146 (H) 70 - 99 mg/dL   Comment 1 Notify RN    Comment 2 Document in Chart   Basic metabolic panel     Status: Abnormal   Collection Time: 06/06/20  7:16 AM  Result Value Ref Range   Sodium 141 135 - 145 mmol/L   Potassium 3.0 (L) 3.5 - 5.1 mmol/L   Chloride 109 98 - 111 mmol/L   CO2 19 (L) 22 - 32 mmol/L   Glucose, Bld 221 (  H) 70 - 99 mg/dL   BUN 18 6 - 20 mg/dL   Creatinine, Ser 9.79 0.61 - 1.24 mg/dL   Calcium 7.9 (L) 8.9 - 10.3 mg/dL   GFR calc non Af Amer >60 >60 mL/min   GFR calc Af Amer >60 >60 mL/min   Anion gap 13 5 - 15  Glucose, capillary     Status: Abnormal   Collection Time: 06/06/20  7:44 AM  Result Value Ref Range   Glucose-Capillary 189 (H) 70 - 99 mg/dL    Recent Results (from the past 240 hour(s))  SARS Coronavirus 2 by RT PCR (hospital order, performed in [facility] hospital lab) Nasopharyngeal Nasopharyngeal Swab     Status: None   Collection Time: 06/04/20 10:05 AM   Specimen: Nasopharyngeal Swab  Result Value Ref Range Status   SARS Coronavirus 2 NEGATIVE NEGATIVE Final    Comment: (NOTE) SARS-CoV-2 target nucleic acids are NOT DETECTED.  The SARS-CoV-2 RNA is generally detectable in upper and lower respiratory specimens during the acute phase of infection. The lowest concentration of SARS-CoV-2 viral copies this assay can detect is 250 copies / mL. A negative result does not preclude SARS-CoV-2 infection and  should not be used as the sole basis for treatment or other patient management decisions.  A negative result may occur with improper specimen collection / handling, submission of specimen other than nasopharyngeal swab, presence of viral mutation(s) within the areas targeted by this assay, and inadequate number of viral copies (<250 copies / mL). A negative result must be combined with clinical observations, patient history, and epidemiological information.  Fact Sheet for Patients:   BoilerBrush.com.cy  Fact Sheet for Healthcare Providers: https://pope.com/  This test is not yet approved or  cleared by the [place] FDA and has been authorized for detection and/or diagnosis of SARS-CoV-2 by FDA under an Emergency Use Authorization (EUA).  This EUA will remain in effect (meaning this test can be used) for the duration of the COVID-19 declaration under Section 564(b)(1) of the Act, 21 U.S.C. section 360bbb-3(b)(1), unless the authorization is terminated or revoked sooner.  Performed at [facility], [address], [place], [place] [address]      Radiology Studies: CT Head Wo Contrast  Result Date: 06/04/2020 CLINICAL DATA:  Altered mental status EXAM: CT HEAD WITHOUT CONTRAST TECHNIQUE: Contiguous axial images were obtained from the base of the skull through the vertex without intravenous contrast. COMPARISON:  10/14/2018 FINDINGS: Brain: No evidence of acute infarction, hemorrhage, hydrocephalus, extra-axial collection or mass lesion/mass effect. Vascular: No hyperdense vessel or unexpected calcification. Skull: Normal. Negative for fracture or focal lesion. Sinuses/Orbits: No acute finding. Other: None. IMPRESSION: No acute intracranial findings. Electronically Signed   By: Duanne Guess D.O.   On: 06/04/2020 10:20   DG Chest Portable 1 View  Result Date: 06/04/2020 CLINICAL DATA:  Shortness of breath and  tachypnea EXAM: PORTABLE CHEST 1 VIEW COMPARISON:  October 14, 2018 FINDINGS: Lungs are clear. Heart size and pulmonary vascularity are normal. No adenopathy. There is upper thoracic levoscoliosis. IMPRESSION: Lungs clear.  Cardiac silhouette normal. Electronically Signed   By: Bretta Bang III M.D.   On: 06/04/2020 09:59   CT Head Wo Contrast  Final Result    DG Chest Portable 1 View  Final Result       Scheduled Meds: . Chlorhexidine Gluconate Cloth  6 each Topical Daily  . enoxaparin (LOVENOX) injection  40 mg Subcutaneous Q24H  . folic acid  1  mg Oral Daily  . insulin aspart  0-9 Units Subcutaneous TID AC & HS  . insulin glargine  18 Units Subcutaneous QHS  . multivitamin with minerals  1 tablet Oral Daily  . thiamine  100 mg Oral Daily   PRN Meds: acetaminophen, dextrose Continuous Infusions: . calcium gluconate    . potassium PHOSPHATE IVPB (in mmol)        LOS: 2 days  Time spent: Greater than 50% of the 35 minute visit was spent in counseling/coordination of care for the patient as laid out in the A&P.   Lewie Chamber, MD [facility] 06/06/2020, 8:59 AM   Contact via secure chat.  To contact the attending provider between 7A-7P or the covering provider during after hours 7P-7A, please log into the web site [email or web address] and access using universal [place] password for that web site. If you do not have the password, please call the hospital operator.

## 2020-06-07 ENCOUNTER — Encounter: Payer: Self-pay | Admitting: Internal Medicine

## 2020-06-07 LAB — CBC WITH DIFFERENTIAL/PLATELET
Abs Immature Granulocytes: 0 10*3/uL (ref 0.00–0.07)
Basophils Absolute: 0 10*3/uL (ref 0.0–0.1)
Basophils Relative: 1 %
Eosinophils Absolute: 0 10*3/uL (ref 0.0–0.5)
Eosinophils Relative: 1 %
HCT: 38.2 % — ABNORMAL LOW (ref 39.0–52.0)
Hemoglobin: 13 g/dL (ref 13.0–17.0)
Immature Granulocytes: 0 %
Lymphocytes Relative: 46 %
Lymphs Abs: 2 10*3/uL (ref 0.7–4.0)
MCH: 27.8 pg (ref 26.0–34.0)
MCHC: 34 g/dL (ref 30.0–36.0)
MCV: 81.6 fL (ref 80.0–100.0)
Monocytes Absolute: 0.6 10*3/uL (ref 0.1–1.0)
Monocytes Relative: 14 %
Neutro Abs: 1.6 10*3/uL — ABNORMAL LOW (ref 1.7–7.7)
Neutrophils Relative %: 38 %
Platelets: 157 10*3/uL (ref 150–400)
RBC: 4.68 MIL/uL (ref 4.22–5.81)
RDW: 13.8 % (ref 11.5–15.5)
WBC: 4.2 10*3/uL (ref 4.0–10.5)
nRBC: 0 % (ref 0.0–0.2)

## 2020-06-07 LAB — BASIC METABOLIC PANEL
Anion gap: 10 (ref 5–15)
BUN: 15 mg/dL (ref 6–20)
CO2: 24 mmol/L (ref 22–32)
Calcium: 8.3 mg/dL — ABNORMAL LOW (ref 8.9–10.3)
Chloride: 105 mmol/L (ref 98–111)
Creatinine, Ser: 0.77 mg/dL (ref 0.61–1.24)
GFR calc Af Amer: >60 mL/min (ref >60)
GFR calc non Af Amer: >60 mL/min (ref >60)
Glucose, Bld: 246 mg/dL — ABNORMAL HIGH (ref 70–99)
Potassium: 3.2 mmol/L — ABNORMAL LOW (ref 3.5–5.1)
Sodium: 139 mmol/L (ref 135–145)

## 2020-06-07 LAB — GLUCOSE, CAPILLARY
Glucose-Capillary: 272 mg/dL — ABNORMAL HIGH (ref 70–99)
Glucose-Capillary: 164 mg/dL — ABNORMAL HIGH (ref 70–99)
Glucose-Capillary: 403 mg/dL — ABNORMAL HIGH (ref 70–99)
Glucose-Capillary: 281 mg/dL — ABNORMAL HIGH (ref 70–99)

## 2020-06-07 LAB — MAGNESIUM: Magnesium: 2 mg/dL (ref 1.7–2.4)

## 2020-06-07 LAB — PHOSPHORUS: Phosphorus: 2.9 mg/dL (ref 2.5–4.6)

## 2020-06-07 LAB — HEMOGLOBIN A1C
Hgb A1c MFr Bld: >15.5 % — ABNORMAL HIGH (ref 4.8–5.6)
Mean Plasma Glucose: >398 mg/dL

## 2020-06-07 MED ORDER — INSULIN GLARGINE 100 UNIT/ML ~~LOC~~ SOLN
20.0000 [IU] | Freq: Every day | SUBCUTANEOUS | Status: DC
  Administered 2020-06-07: 20 [IU] via SUBCUTANEOUS
  Filled 2020-06-07: qty 0.2

## 2020-06-07 MED ORDER — INSULIN ASPART 100 UNIT/ML ~~LOC~~ SOLN
12.0000 [IU] | Freq: Once | SUBCUTANEOUS | Status: AC
  Administered 2020-06-07: 12 [IU] via SUBCUTANEOUS

## 2020-06-07 MED ORDER — POTASSIUM CHLORIDE CRYS ER 20 MEQ PO TBCR
40.0000 meq | EXTENDED_RELEASE_TABLET | Freq: Once | ORAL | Status: AC
  Administered 2020-06-07: 40 meq via ORAL
  Filled 2020-06-07: qty 2

## 2020-06-07 NOTE — Progress Notes (Signed)
Pt given and explained discharge instructions. Pt had no questions or concerns. Pt given doctor's note for work. IV's removed. Pt dressed in personal clothing. Pt taken to main entrance via wheelchair.

## 2020-06-07 NOTE — Discharge Summary (Signed)
Physician Discharge Summary  Edward Walls PYP:[id number] DOB: 08-12-1981 DOA: 06/04/2020  PCP: Mliss Sax, MD  Admit date: 06/04/2020 Discharge date: 06/07/2020  Admitted From: home Disposition:  home Discharging physician: Lewie Chamber, MD  Recommendations for Outpatient Follow-up:  1. Follow-up with endocrinology to discuss any change to insulin regimen as well as resuming back on a CGM  Patient discharged to home in Discharge Condition: stable CODE STATUS: Full Diet recommendation:  Diet Orders (From admission, onward)    Start     Ordered   06/07/20 0000  Diet Carb Modified        06/07/20 1338   06/05/20 1901  Diet Carb Modified Fluid consistency: Thin; Room service appropriate? Yes  Diet effective now       Question Answer Comment  Diet-HS Snack? Nothing   Calorie Level Medium 1600-2000   Fluid consistency: Thin   Room service appropriate? Yes      06/05/20 1904          Hospital Course: RemainMr. Edward Walls is a 39 yo male with PMH extremely poorly controlled diabetes (last A1c 17.2% on 01/28/2020), alcohol use who was brought to the ER for altered mental status and high glucose levels.  He had contacted his mom that he wanted to come to the hospital after feeling progressively worse. After work-up in the ER, he was found to be in severe DKA.  He was started on IV fluids and insulin infusion. He responded to treatment and his DKA resolved. He was started on a carb consistent diet and resumed on SQ insulins.  He was counseled bedside regarding better compliance and control of his DM as well as to reduce his alcohol consumption.   He continued to stable and tolerated diet.  His insulin regimen was adjusted for his glucose levels.  He was relatively well controlled with Lantus regimen of 18 to 20 units during hospitalization, likely would be optimal on a Lantus dose around 25 units daily.  He states that he is on 40 units daily of Tresiba at home with average  CBGs around 250-280.  Rather than make any further adjustments given the discrepancy, he was recommended to follow-up with endocrinology outpatient and discuss any further regimen changes.  He also voiced interest in getting back on a CGM if possible. Glucose levels were stable at time of discharge and patient was alert, oriented, and tolerating diet well.   Diabetes mellitus type 1.5, managed as type 1 (HCC) -Last A1c 17% in April 2021 -Follow-up pending A1c: ">15.5%" - patient states he takes 40 units Levemir at home; currently on 18units lantus and tolerating well.  Increased Lantus to 20 units daily. -Given the discrepancy between home dosing and good control during hospitalization on lower basal dose, recommended he discuss this with endocrinology at discharge, he voiced understanding  DKA, type 1 (HCC) -Bicarb undetectable on admission, beta hydroxybutyric acid greater than 8, pH 6.87 -Started on insulin drip and appropriate IV fluids - s/p bicarb drip; ABG monitored - now with DKA resolved, will de-escalate treatment, see DM  Alcohol abuse - last drink per patient 2 weeks ago; denies any w/d sxms - d/c CIWA  Acute metabolic encephalopathy - due to underlying severe DKA; possibly etoh - now back to normal baseline - continue routine monitoring   AKI (acute kidney injury) (HCC) - resolved with IVF - trend BMP  Hypokalemia - replete and recheck as needed  Hypophosphatemia - replete and recheck as needed  Hypomagnesemia - replete and  recheck as needed  Hypocalcemia - replete and recheck as needed    The patient's chronic medical conditions were treated accordingly per the patient's home medication regimen except as noted.  On day of discharge, patient was felt deemed stable for discharge. Patient/family member advised to call PCP or come back to ER if needed.   Discharge Diagnoses:   Principal Diagnosis: <principal problem not specified>  Active Hospital Problems    Diagnosis Date Noted  . Diabetes mellitus type 1.5, 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    Priority: High  . Hypokalemia 06/06/2020  . Hypophosphatemia 06/06/2020  . Hypomagnesemia 06/06/2020  . Hypocalcemia 06/06/2020  . Alcohol abuse 03/14/2018    Resolved Hospital Problems   Diagnosis Date Noted Date Resolved  . DKA, type 1 (HCC) 06/04/2020 06/06/2020    Priority: High  . Acute metabolic encephalopathy 06/05/2020 06/06/2020  . AKI (acute kidney injury) (HCC) 07/03/2017 06/06/2020    Discharge Instructions    Diet Carb Modified   Complete by: As directed    Discharge instructions   Complete by: As directed    Follow-up with your endocrinologist and discuss any further changes to your insulin regimen. Try to obtain continuous glucose monitoring system through endocrinology if able   Increase activity slowly   Complete by: As directed      Allergies as of 06/07/2020      Reactions   Peanut-containing Drug Products Other (See Comments)   Throat burning       Medication List    STOP taking these medications   terbinafine 250 MG tablet Commonly known as: LamISIL     TAKE these medications   Baqsimi One Pack 3 MG/DOSE Powd Generic drug: Glucagon Place 3 mg into the nose once as needed for up to 1 dose.   eucerin cream Apply topically in the morning and at bedtime. What changed:   how much to take  when to take this  reasons to take this   FreeStyle Libre 14 Day Sensor Misc For continuous glucose monitoring   Insulin Pen Needle 32G X 4 MM Misc Use 4-5x a day   Lyumjev KwikPen 100 UNIT/ML Sopn Generic drug: Insulin Lispro-aabc (1 U Dial) Inject 12-14 units 3x a day before meals What changed:   how much to take  how to take this  when to take this  additional instructions   Tresiba FlexTouch 200 UNIT/ML FlexTouch Pen Generic drug: insulin degludec Inject 40 Units into the skin daily.   triamcinolone ointment 0.1 % Commonly known as:  KENALOG Apply 1 application topically 2 (two) times daily. What changed:   when to take this  reasons to take this       Allergies  Allergen Reactions  . Peanut-Containing Drug Products Other (See Comments)    Throat burning     Consultations: None  Discharge Exam: BP 126/85 (BP Location: Right Arm)   Pulse 79   Temp 98.3 F (36.8 C) (Oral)   Resp 20   Ht 6' (1.829 m)   Wt 82.6 kg   SpO2 100%   BMI 24.70 kg/m  General appearance: alert, cooperative and no distress Head: Normocephalic, without obvious abnormality, atraumatic Eyes: EOMI Lungs: clear to auscultation bilaterally Heart: regular rate and rhythm and S1, S2 normal Abdomen: normal findings: bowel sounds normal and soft, non-tender Extremities: No edema Skin: mobility and turgor normal Neurologic: Grossly normal  The results of significant diagnostics from this hospitalization (including imaging, microbiology, ancillary and laboratory) are listed below  for reference.   Microbiology: Recent Results (from the past 240 hour(s))  SARS Coronavirus 2 by RT PCR (hospital order, performed in [facility] lab) Nasopharyngeal Nasopharyngeal Swab     Status: None   Collection Time: 06/04/20 10:05 AM   Specimen: Nasopharyngeal Swab  Result Value Ref Range Status   SARS Coronavirus 2 NEGATIVE NEGATIVE Final    Comment: (NOTE) SARS-CoV-2 target nucleic acids are NOT DETECTED.  The SARS-CoV-2 RNA is generally detectable in upper and lower respiratory specimens during the acute phase of infection. The lowest concentration of SARS-CoV-2 viral copies this assay can detect is 250 copies / mL. A negative result does not preclude SARS-CoV-2 infection and should not be used as the sole basis for treatment or other patient management decisions.  A negative result may occur with improper specimen collection / handling, submission of specimen other than nasopharyngeal swab, presence of viral mutation(s) within  the areas targeted by this assay, and inadequate number of viral copies (<250 copies / mL). A negative result must be combined with clinical observations, patient history, and epidemiological information.  Fact Sheet for Patients:   BoilerBrush.com.cyhttps://www.fda.gov/media/136312/download  Fact Sheet for Healthcare Providers: https://pope.com/https://www.fda.gov/media/136313/download  This test is not yet approved or  cleared by the [place] FDA and has been authorized for detection and/or diagnosis of SARS-CoV-2 by FDA under an Emergency Use Authorization (EUA).  This EUA will remain in effect (meaning this test can be used) for the duration of the COVID-19 declaration under Section 564(b)(1) of the Act, 21 U.S.C. section 360bbb-3(b)(1), unless the authorization is terminated or revoked sooner.  Performed at [facility], [address] [place], [place] [address]   MRSA PCR Screening     Status: None   Collection Time: 06/06/20 10:50 AM   Specimen: Nasal Mucosa; Nasopharyngeal  Result Value Ref Range Status   MRSA by PCR NEGATIVE NEGATIVE Final    Comment:        The GeneXpert MRSA Assay (FDA approved for NASAL specimens only), is one component of a comprehensive MRSA colonization surveillance program. It is not intended to diagnose MRSA infection nor to guide or monitor treatment for MRSA infections. Performed at [facility], [address] [place], [place] [address]      Labs: BNP (last 3 results) No results for input(s): BNP in the last 8760 hours. Basic Metabolic Panel: Recent Labs  Lab 06/04/20 1402 06/04/20 1944 06/05/20 0431 06/05/20 0748 06/05/20 0800 06/05/20 1751 06/05/20 1751 06/05/20 2216 06/06/20 0229 06/06/20 0716 06/06/20 1843 06/06/20 1849 06/07/20 0543  NA 144   < > QUESTIONABLE RESULTS, RECOMMEND RECOLLECT TO VERIFY   < >  --  148*   < > 147* 143 141  --  142 139  K 4.5   < > QUESTIONABLE RESULTS, RECOMMEND RECOLLECT TO  VERIFY   < >  --  2.7*   < > 2.6* 3.2* 3.0*  --  2.7* 3.2*  CL 118*   < > QUESTIONABLE RESULTS, RECOMMEND RECOLLECT TO VERIFY   < >  --  115*   < > 121* 112* 109  --  103 105  CO2 <7*   < > QUESTIONABLE RESULTS, RECOMMEND RECOLLECT TO VERIFY   < >  --  22   < > 15* 19* 19*  --  22 24  GLUCOSE 395*   < > QUESTIONABLE RESULTS, RECOMMEND RECOLLECT TO VERIFY   < >  --  109*   < > 136* 183*  221*  --  241* 246*  BUN 16   < > QUESTIONABLE RESULTS, RECOMMEND RECOLLECT TO VERIFY   < >  --  20   < > 14 18 18   --  15 15  CREATININE 1.55*   < > QUESTIONABLE RESULTS, RECOMMEND RECOLLECT TO VERIFY   < >  --  0.96   < > 0.60* 0.84 0.97  --  0.94 0.77  CALCIUM 8.1*   < > QUESTIONABLE RESULTS, RECOMMEND RECOLLECT TO VERIFY   < >  --  8.7*   < > 5.8* 8.1* 7.9*  --  8.6* 8.3*  MG 2.6*  --  QUESTIONABLE RESULTS, RECOMMEND RECOLLECT TO VERIFY  --   --  2.0  --   --  2.8*  --  2.2  --  2.0  PHOS 3.8  --   --   --  <1.0* 1.2*  --   --  1.2*  --   --   --  2.9   < > = values in this interval not displayed.   Liver Function Tests: Recent Labs  Lab 06/04/20 0921  AST 18  ALT 18  ALKPHOS 85  BILITOT 1.1  PROT 7.9  ALBUMIN 4.5   Recent Labs  Lab 06/04/20 0921 06/05/20 0431  LIPASE 132* QUESTIONABLE RESULTS, RECOMMEND RECOLLECT TO VERIFY   No results for input(s): AMMONIA in the last 168 hours. CBC: Recent Labs  Lab 06/04/20 0921 06/04/20 1402 06/06/20 0229 06/07/20 0543  WBC 12.6* 14.0* 6.2 4.2  NEUTROABS  --   --  3.7 1.6*  HGB 16.3 16.2 12.8* 13.0  HCT 53.0* 52.5* 37.4* 38.2*  MCV 89.5 89.7 81.3 81.6  PLT 253 246 163 157   Cardiac Enzymes: No results for input(s): CKTOTAL, CKMB, CKMBINDEX, TROPONINI in the last 168 hours. BNP: Invalid input(s): POCBNP CBG: Recent Labs  Lab 06/06/20 1801 06/06/20 2113 06/07/20 0740 06/07/20 1156 06/07/20 1334  GLUCAP 271* 323* 272* 403* 281*   D-Dimer No results for input(s): DDIMER in the last 72 hours. Hgb A1c Recent Labs    06/04/20 1402   HGBA1C >15.5*   Lipid Profile No results for input(s): CHOL, HDL, LDLCALC, TRIG, CHOLHDL, LDLDIRECT in the last 72 hours. Thyroid function studies No results for input(s): TSH, T4TOTAL, T3FREE, THYROIDAB in the last 72 hours.  Invalid input(s): FREET3 Anemia work up No results for input(s): VITAMINB12, FOLATE, FERRITIN, TIBC, IRON, RETICCTPCT in the last 72 hours. Urinalysis    Component Value Date/Time   COLORURINE YELLOW 06/04/2020 0916   APPEARANCEUR CLEAR 06/04/2020 0916   LABSPEC 1.020 06/04/2020 0916   PHURINE 5.0 06/04/2020 0916   GLUCOSEU >=500 (A) 06/04/2020 0916   GLUCOSEU >=1000 (A) 01/28/2020 1110   HGBUR SMALL (A) 06/04/2020 0916   BILIRUBINUR NEGATIVE 06/04/2020 0916   KETONESUR 80 (A) 06/04/2020 0916   PROTEINUR 100 (A) 06/04/2020 0916   UROBILINOGEN 0.2 01/28/2020 1110   NITRITE NEGATIVE 06/04/2020 0916   LEUKOCYTESUR NEGATIVE 06/04/2020 0916   Sepsis Labs Invalid input(s): PROCALCITONIN,  WBC,  LACTICIDVEN Microbiology Recent Results (from the past 240 hour(s))  SARS Coronavirus 2 by RT PCR (hospital order, performed in [facility] hospital lab) Nasopharyngeal Nasopharyngeal Swab     Status: None   Collection Time: 06/04/20 10:05 AM   Specimen: Nasopharyngeal Swab  Result Value Ref Range Status   SARS Coronavirus 2 NEGATIVE NEGATIVE Final    Comment: (NOTE) SARS-CoV-2 target nucleic acids are NOT DETECTED.  The SARS-CoV-2 RNA is generally detectable in upper  and lower respiratory specimens during the acute phase of infection. The lowest concentration of SARS-CoV-2 viral copies this assay can detect is 250 copies / mL. A negative result does not preclude SARS-CoV-2 infection and should not be used as the sole basis for treatment or other patient management decisions.  A negative result may occur with improper specimen collection / handling, submission of specimen other than nasopharyngeal swab, presence of viral mutation(s) within the areas targeted by  this assay, and inadequate number of viral copies (<250 copies / mL). A negative result must be combined with clinical observations, patient history, and epidemiological information.  Fact Sheet for Patients:   BoilerBrush.com.cy  Fact Sheet for Healthcare Providers: https://pope.com/  This test is not yet approved or  cleared by the [place] FDA and has been authorized for detection and/or diagnosis of SARS-CoV-2 by FDA under an Emergency Use Authorization (EUA).  This EUA will remain in effect (meaning this test can be used) for the duration of the COVID-19 declaration under Section 564(b)(1) of the Act, 21 U.S.C. section 360bbb-3(b)(1), unless the authorization is terminated or revoked sooner.  Performed at [facility], [address] [place], [place] [address]   MRSA PCR Screening     Status: None   Collection Time: 06/06/20 10:50 AM   Specimen: Nasal Mucosa; Nasopharyngeal  Result Value Ref Range Status   MRSA by PCR NEGATIVE NEGATIVE Final    Comment:        The GeneXpert MRSA Assay (FDA approved for NASAL specimens only), is one component of a comprehensive MRSA colonization surveillance program. It is not intended to diagnose MRSA infection nor to guide or monitor treatment for MRSA infections. Performed at [facility], [address] [place], [place] [address]     Procedures/Studies: CT Head Wo Contrast  Result Date: 06/04/2020 CLINICAL DATA:  Altered mental status EXAM: CT HEAD WITHOUT CONTRAST TECHNIQUE: Contiguous axial images were obtained from the base of the skull through the vertex without intravenous contrast. COMPARISON:  10/14/2018 FINDINGS: Brain: No evidence of acute infarction, hemorrhage, hydrocephalus, extra-axial collection or mass lesion/mass effect. Vascular: No hyperdense vessel or unexpected calcification. Skull: Normal. Negative for fracture or focal  lesion. Sinuses/Orbits: No acute finding. Other: None. IMPRESSION: No acute intracranial findings. Electronically Signed   By: Duanne Guess D.O.   On: 06/04/2020 10:20   DG Chest Portable 1 View  Result Date: 06/04/2020 CLINICAL DATA:  Shortness of breath and tachypnea EXAM: PORTABLE CHEST 1 VIEW COMPARISON:  October 14, 2018 FINDINGS: Lungs are clear. Heart size and pulmonary vascularity are normal. No adenopathy. There is upper thoracic levoscoliosis. IMPRESSION: Lungs clear.  Cardiac silhouette normal. Electronically Signed   By: Bretta Bang III M.D.   On: 06/04/2020 09:59     Time coordinating discharge: Over 30 minutes    Lewie Chamber, MD  [facility] 06/07/2020, 1:42 PM Pager: Secure chat  If 7PM-7AM, please contact night-coverage [email or web address] Password [email or web address]

## 2020-06-07 NOTE — Progress Notes (Signed)
Inpatient Diabetes Program Recommendations  AACE/ADA: New Consensus Statement on Inpatient Glycemic Control (2015)  Target Ranges:  Prepandial:   less than 140 mg/dL      Peak postprandial:   less than 180 mg/dL (1-2 hours)      Critically ill patients:  140 - 180 mg/dL   Lab Results  Component Value Date   GLUCAP 272 (H) 06/07/2020   HGBA1C 17.2 (H) 01/28/2020    Review of Glycemic Control Results for Edward Walls, Edward Walls (MRN [id number]) as of 06/07/2020 09:51  Ref. Range 06/06/2020 11:25 06/06/2020 16:21 06/06/2020 18:01 06/06/2020 21:13 06/07/2020 07:40  Glucose-Capillary Latest Ref Range: 70 - 99 mg/dL 620 (H) 355 (H) 974 (H) 323 (H) 272 (H)    Diabetes history: DM1 Outpatient Diabetes medications: Tresiba 40 units QD, Lispro aabc 12-14 units tidwc Current orders for Inpatient glycemic control:  Lantus 18 units qhs Novolog 0-9 units tid + hs  HgbA1C - 17.2%, glucose on presentation 1,476  Inpatient Diabetes Program Recommendations:     -  Increase Lantus to 28 units -  Increase Novolog to "moderate" 0-15 units tid  -  Add Novolog 4 units tid meal coverage if eating > 50% of meals.   Note pt follow with Dr. Elvera Lennox, Endocrinologist, outpatient and has been counseled and educated on why and how to give his insulins consistently. Last visit recently on 03/23/20. She noted noncompliance to diet, exercise, and medications. She wanted follow up in the next 1.5 months.   -  Have pt follow up with Dr. Elvera Lennox with [facility] within the next 1-2 weeks due to DKA admission.  Thank you. Christena Deem RN, MSN, BC-ADM Inpatient Diabetes Coordinator Team Pager [phone] (8a-5p)

## 2020-08-06 ENCOUNTER — Ambulatory Visit: Payer: Managed Care, Other (non HMO) | Admitting: Internal Medicine

## 2020-09-24 ENCOUNTER — Ambulatory Visit (INDEPENDENT_AMBULATORY_CARE_PROVIDER_SITE_OTHER): Payer: Managed Care, Other (non HMO) | Admitting: Internal Medicine

## 2020-09-24 ENCOUNTER — Encounter: Payer: Self-pay | Admitting: Internal Medicine

## 2020-09-24 ENCOUNTER — Other Ambulatory Visit: Payer: Self-pay

## 2020-09-24 VITALS — BP 120/72 | HR 103 | Ht 75.0 in | Wt 167.6 lb

## 2020-09-24 DIAGNOSIS — E785 Hyperlipidemia, unspecified: Secondary | ICD-10-CM

## 2020-09-24 DIAGNOSIS — E1010 Type 1 diabetes mellitus with ketoacidosis without coma: Secondary | ICD-10-CM

## 2020-09-24 LAB — POCT GLYCOSYLATED HEMOGLOBIN (HGB A1C): HbA1c POC (<> result, manual entry): >15 % (ref 4.0–5.6)

## 2020-09-24 NOTE — Patient Instructions (Signed)
Please continue: - Tresiba 40 units daily  - in the abdomen  Please change Lyumjev to taking it before meals - in the abdomen, thighs or upper arms: - 20-24 units, depending on the size of the meal  For correction, use Lyumjev: - 150-175: + 1 unit  - 176-200: + 2 units  - 201-225: + 3 units  - 226-250: + 4 units  - 251-275: + 5 units - 276-300: + 6 units >300: + 7 units  Please return in 3 months but let me know how the sugars are doing in ~2-3 weeks.

## 2020-09-24 NOTE — Progress Notes (Signed)
Patient ID: Edward Walls, male   DOB: 01/15/1981, 39 y.o.   MRN: [id number]   This visit occurred during the SARS-CoV-2 public health emergency.  Safety protocols were in place, including screening questions prior to the visit, additional usage of staff PPE, and extensive cleaning of exam room while observing appropriate contact time as indicated for disinfecting solutions.   HPI: Edward Walls is a 39 y.o.-year-old male, returning for follow-up for DM1, dx in 2017, insulin-dependent, uncontrolled, with complications (recurrent DKA episodes).  Last visit 6 months ago.  Before last visit, he relaxed his diet, stopped going to the gym, missing insulin doses.  In fact, he was telling me that he did not take [place] at all for several months before last visit and was missing Humalog doses.  He was also not checking sugars consistently after he came off the CGM... An HbA1c from 01/2020 returned to go higher than before, at 17.2%.   At last visit, we restarted [place]iba and change from Humalog to Lyumjev.  Reviewed HbA1c levels: Lab Results  Component Value Date   HGBA1C >15.5 (H) 06/04/2020   HGBA1C 17.2 (H) 01/28/2020   HGBA1C >15.5 (H) 10/14/2018   HGBA1C 15.9 (H) 03/14/2018   HGBA1C 14.5 (H) 07/04/2017   HGBA1C 12.0 (H) 10/20/2016  07/05/2017: GAD Ab <5  Before last visit he was on: - Tresiba U200 30 >> 40 units at 11-12 pm - Humalog 6 >> 9-12 units 3x a day, before meals He had problems affording insulins in the past, but [place]iba and Humalog are covered.  We changed to: - Tresiba 40 units daily >> off >> restarted Tresiba 26 >> 40 units daily in arms - misses doses - at least 2x a week - Humalog  12-14 units before meals >> Lyumjev 12 to 14 >> 15 >> 20 units 3 times a day before meals >> may skip and take 20 units for correction after a meal  He was previously on a freestyle libre CGM but developed irritation due to adhesive and stopped.  At last visit, he was not checking sugars.   We started him on the freestyle libre 2 CGM.  However, at today's visit, the CGM was only active 17% in the last 2 weeks.  Freestyle libre parameters: - average: 244 +/-  95.2 >> 160 >> 442 - Active CGM time: 84% >> 17% of the time - Glucose variability 31.8% >> 14.6% (target less than or equal to 36%) - time in range:  - very low (<54): 1% >> 0% - low (64-69): 3% >> 0% - normal range (70-180): 67% >> 0% - high sugars (181-250): 24% >> 1% - very high sugars (>250): 5% >> 99%    Previously:  Lowest sugar was 60 (Libre CGM) >> 53 >> 70s; he has hypoglycemia awareness in the 80s. Highest sugar was 454 >> 300s x1 - milk shake >> 200s >> HI. He had 1 previous DKA episode when he could not afford his insulin in the past.  Glucometer: Freestyle Libre >> freestyle libre 2  Pt's meals are: - Breakfast: oatmeal + banana - Lunch: meatloaf, chilli, salad - Dinner: meat + veggies - Snacks: 8 pm  - crackers, at work -works nights Stopped desserts after his hospitalization.  No CKD, last BUN/creatinine:  Lab Results  Component Value Date   BUN 15 06/07/2020   BUN 15 06/06/2020   CREATININE 0.77 06/07/2020   CREATININE 0.94 06/06/2020   + Dyslipidemia: Lab Results  Component Value Date  CHOL 118 01/28/2020   HDL 30.60 (L) 01/28/2020   LDLCALC 75 01/28/2020   TRIG 65.0 01/28/2020   CHOLHDL 4 01/28/2020   -No previous eye exams.  At last visit, I recommended that he sees Dr. Randon Goldsmith and gave him his information.  He did not see him yet.  -no numbness and tingling in his feet.  Pt has FH of DM in M, F, all 3 of his brothers - all have DM2.  ROS: Constitutional: no weight gain/no weight loss, no fatigue, no subjective hyperthermia, no subjective hypothermia Eyes: no blurry vision, no xerophthalmia ENT: no sore throat, no nodules palpated in neck, no dysphagia, no odynophagia, no hoarseness Cardiovascular: no CP/no SOB/no palpitations/no leg swelling Respiratory: no cough/no  SOB/no wheezing Gastrointestinal: no N/no V/no D/no C/no acid reflux Musculoskeletal: no muscle aches/no joint aches Skin: no rashes, no hair loss Neurological: no tremors/no numbness/no tingling/no dizziness  I reviewed pt's medications, allergies, PMH, social hx, family hx, and changes were documented in the history of present illness. Otherwise, unchanged from my initial visit note.  Past Medical History:  Diagnosis Date  . Allergy   . Diabetes mellitus without complication [facility])    Past Surgical History:  Procedure Laterality Date  . DENTAL SURGERY     Social History   Socioeconomic History  . Marital status: Single    Spouse name: Not on file  . Number of children:  To: 13 and 17 10/2008  . Years of education: Not on file  . Highest education level: Not on file  Occupational History  .  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 Financial resource strain: Not on file  . Food insecurity:    Worry: Not on file    Inability: Not on file  . Transportation needs:    Medical: Not on file    Non-medical: Not on file  Tobacco Use  . Smoking status: Former Smoker    Types: Cigarettes  . Smokeless tobacco: Never Used  Substance and Sexual Activity  . Alcohol use:  no    Alcohol/week:    Types:  Marland Kitchen Drug use: Not Currently  . Sexual activity: Not on file  Lifestyle  . Physical activity:    Days per week: Not on file    Minutes per session: Not on file  . Stress: Not on file  Relationships  . Social connections:    Talks on phone: Not on file    Gets together: Not on file    Attends religious service: Not on file    Active member of club or organization: Not on file    Attends meetings of clubs or organizations: Not on file    Relationship status: Not on file  . Intimate partner violence:    Fear of current or ex partner: Not on file    Emotionally abused: Not on file    Physically abused: Not on file    Forced sexual activity: Not on file  Other  Topics Concern  . Not on file  Social History Narrative  . Not on file   Current Outpatient Medications on File Prior to Visit  Medication Sig Dispense Refill  . Continuous Blood Gluc Sensor (FREESTYLE LIBRE 14 DAY SENSOR) MISC For continuous glucose monitoring 6 each 2  . Glucagon (BAQSIMI ONE PACK) 3 MG/DOSE POWD Place 3 mg into the nose once as needed for up to 1 dose. 1 each prn  . insulin degludec (TRESIBA FLEXTOUCH) 200 UNIT/ML FlexTouch Pen  Inject 40 Units into the skin daily. 6 pen 3  . Insulin Lispro-aabc, 1 U Dial, (LYUMJEV KWIKPEN) 100 UNIT/ML SOPN Inject 12-14 units 3x a day before meals (Patient taking differently: Inject 12-14 Units into the skin 3 (three) times daily with meals. ) 5 pen 11  . Insulin Pen Needle 32G X 4 MM MISC Use 4-5x a day 300 each 3  . Skin Protectants, Misc. (EUCERIN) cream Apply topically in the morning and at bedtime. (Patient taking differently: Apply 1 application topically 2 (two) times daily as needed for dry skin. ) 454 g 0  . triamcinolone ointment (KENALOG) 0.1 % Apply 1 application topically 2 (two) times daily. (Patient taking differently: Apply 1 application topically 2 (two) times daily as needed (rash). ) 453.6 g 0   No current facility-administered medications on file prior to visit.   Allergies  Allergen Reactions  . Peanut-Containing Drug Products Other (See Comments)    Throat burning    Family History  Problem Relation Age of Onset  . Diabetes Mellitus II Mother   . Diabetes Mellitus II Father   . Diabetes Mellitus II Brother    PE: There were no vitals taken for this visit. Wt Readings from Last 3 Encounters:  06/04/20 182 lb 1.6 oz (82.6 kg)  03/23/20 182 lb (82.6 kg)  01/28/20 179 lb 12.8 oz (81.6 kg)        Constitutional: normal weight, in NAD Eyes: PERRLA, EOMI, no exophthalmos ENT: moist mucous membranes, no thyromegaly, no cervical lymphadenopathy Cardiovascular: RRR, No MRG Respiratory: CTA B Gastrointestinal:  abdomen soft, NT, ND, BS+ Musculoskeletal: no deformities, strength intact in all 4 Skin: moist, warm, no rashes Neurological: no tremor with outstretched hands, DTR normal in all 4  ASSESSMENT: 1. DM1, insulin-dependent, uncontrolled, without long-term complications, but with DKA episodes  Component     Latest Ref Rng & Units 12/12/2018  Glucose, Plasma     65 - 99 mg/dL 622 (H)  C-Peptide     0.80 - 3.85 ng/mL 0.52 (L)  Islet Cell Ab     Neg:<1:1 Negative  ZNT8 Antibodies     U/mL <15   2.  Dyslipidemia  PLAN:  1. Patient with longstanding, uncontrolled, insulin-dependent diabetes, diagnosed as type I in 2020, with history of DKA in the setting of not affording his insulin, returning after another long absence, for 6 months, rather than the recommended 1.5 months after last visit.  He rescheduled this appointment several times. -At last visit, he was not compliant with diet, exercise, and his insulin regimen.  In fact, he had stopped [place] completely several months before our last visit.  I explained that this was very dangerous practice, conducive to DKA.  We restarted Evaristo Bury initially at 26 units and advised him to increase to 40 units if needed.  He was previously injecting this in the legs and I advised him to move this to a more central location.  Regarding his Humalog, he was injecting into right before the meals, also, to help him with compliance, I advised him to change to Lyumjev, which has a shorter onset and can be injected at the start of meals, rather than 15 minutes before, as recommended for Humalog.  At that time, he was also interested in getting back on the CGM and I advised him to use the freestyle libre 2 CGM, which has alarms.  I sent a prescription to his pharmacy. -At this visit, he has the CGM on, however, there is not  enough data for analysis.  He only wore the CGM for 17% of the time in the last 14 days.  However, his average blood sugar is very high, at 442 and  none of the blood sugars are in range.  99% of the blood sugars are higher than 250. -He tells me that he increase the dose of Tresiba to 40 units and he may forget this twice a week.  I advised him to try not to forget it and to set alarms on his phone to take it.  Also, he is taking Lyumjev inconsistently, occasionally after meals, for correction.  We discussed about the importance of taking it before the meals and I gave him a sliding scale for correction. -At this visit, he did not follow my advice from last visit to inject Tresiba in the abdomen and Lyumjev in the more peripheral location and he is still injecting [place] and thighs and Lyumjev in the abdomen.  I again advised him that ideally they would be all injected in the abdomen but if he wants to save his injection sites on the abdomen, to only keep Tresiba injected in this area. -I also advised him to make sure that his insulin is not expired -At this visit, I will also refer him to diabetes education since his sugars are extremely high and I am not completely sure what we are missing... - I suggested to:  Patient Instructions  Please continue: - Tresiba 40 units daily  - in the abdomen  Please change Lyumjev to taking it before meals - in the abdomen, thighs or upper arms: - 20-24 units, depending on the size of the meal  For correction, use Lyumjev: - 150-175: + 1 unit  - 176-200: + 2 units  - 201-225: + 3 units  - 226-250: + 4 units  - 251-275: + 5 units - 276-300: + 6 units >300: + 7 units  Please return in 3 months but let me know how the sugars are doing in ~2-3 weeks.  - we checked his HbA1c: >15% (still EXTREMELY HIGH) - advised to check sugars at different times of the day - 4x a day, rotating check times - advised for yearly eye exams >> he is not UTD - return to clinic in 3 months  2.  Dyslipidemia - Reviewed latest lipid panel from 01/2020: LDL at goal, HDL slightly low: Lab Results  Component Value Date    CHOL 118 01/28/2020   HDL 30.60 (L) 01/28/2020   LDLCALC 75 01/28/2020   TRIG 65.0 01/28/2020   CHOLHDL 4 01/28/2020  -He is not on a statin.  Before last visit, he bought a gym membership and also starting to go improve his diet   Carlus Pavlov, MD PhD [facility]

## 2020-10-12 ENCOUNTER — Encounter: Payer: Self-pay | Admitting: Internal Medicine

## 2020-11-22 ENCOUNTER — Other Ambulatory Visit: Payer: Self-pay

## 2020-11-24 ENCOUNTER — Other Ambulatory Visit: Payer: Self-pay

## 2020-11-24 ENCOUNTER — Ambulatory Visit (INDEPENDENT_AMBULATORY_CARE_PROVIDER_SITE_OTHER): Payer: Managed Care, Other (non HMO) | Admitting: Internal Medicine

## 2020-11-24 ENCOUNTER — Encounter: Payer: Self-pay | Admitting: Internal Medicine

## 2020-11-24 VITALS — BP 130/90 | HR 95 | Ht 75.0 in | Wt 178.0 lb

## 2020-11-24 DIAGNOSIS — E1010 Type 1 diabetes mellitus with ketoacidosis without coma: Secondary | ICD-10-CM

## 2020-11-24 DIAGNOSIS — E785 Hyperlipidemia, unspecified: Secondary | ICD-10-CM | POA: Diagnosis not present

## 2020-11-24 LAB — POCT GLYCOSYLATED HEMOGLOBIN (HGB A1C): Hemoglobin A1C: 13.7 % — AB (ref 4.0–5.6)

## 2020-11-24 MED ORDER — TRESIBA FLEXTOUCH 200 UNIT/ML ~~LOC~~ SOPN
40.0000 [IU] | PEN_INJECTOR | Freq: Every day | SUBCUTANEOUS | 3 refills | Status: DC
Start: 2020-11-24 — End: 2021-07-01

## 2020-11-24 NOTE — Progress Notes (Signed)
Patient ID: Edward Walls, male   DOB: [birth year], 40 y.o.   MRN: [id number]   This visit occurred during the SARS-CoV-2 public health emergency.  Safety protocols were in place, including screening questions prior to the visit, additional usage of staff PPE, and extensive cleaning of exam room while observing appropriate contact time as indicated for disinfecting solutions.   HPI: Edward Walls is a 40 y.o.-year-old male, returning for follow-up for DM1, dx in 2017, insulin-dependent, uncontrolled, with complications (recurrent DKA episodes).  Last visit 2 months ago.  At the beginning of last year, he relaxed his diet, stopped going to the gym, missing insulin doses 4 months. Was also not checking blood sugars. An HbA1c from 01/2020 returned very high, 17.2%. Before last visit, we restarted [place] and change from Humalog to Lyumjev.  Sugars improved slightly since last visit.  At this visit, he describes bilateral leg pain after lifting at work (advised to get in touch with PCP).  Reviewed HbA1c levels: Lab Results  Component Value Date   HGBA1C >15.0 09/24/2020   HGBA1C >15.5 (H) 06/04/2020   HGBA1C 17.2 (H) 01/28/2020   HGBA1C >15.5 (H) 10/14/2018   HGBA1C 15.9 (H) 03/14/2018   HGBA1C 14.5 (H) 07/04/2017   HGBA1C 12.0 (H) 10/20/2016  07/05/2017: GAD Ab <5  Before last visit he was on: - Tresiba U200 30 >> 40 units at 11-12 pm - Humalog 6 >> 9-12 units 3x a day, before meals He had problems affording insulins in the past, but [place] and Humalog are covered.  At last visit he was on: - Tresiba 40 units daily >> off >> restarted Tresiba 26 >> 40 units daily in arms -was missing at least 2 doses a week a - Humalog  12-14 units before meals >> Lyumjev 12 to 14 >> 15 >> 20 units 3 times a day before meals >> may skip and take 20 units for correction after a meal  I advised him to use the following regimen: - Tresiba 40 units daily (in the abdomen) - Lyumjev 20-24 units,  depending on the size of the meal (in the abdomen, thighs or upper arms) >> he decreased this to 12 units (!),  But takes the same amount for correction only (!) - Lyumjev sliding scale: - 150-175: + 1 unit  - 176-200: + 2 units  - 201-225: + 3 units  - 226-250: + 4 units  - 251-275: + 5 units - 276-300: + 6 units >300: + 7 units  He was previously on the freestyle libre CGM but developed irritation due to adhesive. Currently on freestyle libre 2 with good tolerance.  Freestyle libre 2  parameters: - average: 244 +/-  95.2 >> 160 >> 442 >> 328 - Active CGM time: 84% >> 17% >> 38% of the time - Glucose variability 31.8% >> 14.6% >> 31.3% (target <36%) - time in range:  - very low (<54): 1% >> 0% >> 0% - low (64-69): 3% >> 0% >> 0% - normal range (70-180): 67% >> 0% >> 10% - high sugars (181-250): 24% >> 1% >> 7% - very high sugars (>250): 5% >> 99% >> 83%    Previously:   Previously:  Lowest sugar was  53 >> 70s >> 61 (Libre); he has hypoglycemia awareness in the 80s. Highest sugar was 200s >> HI >> HI. He had 1 previous DKA episode when he could not afford his insulin in the past.  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  No CKD, last BUN/creatinine:  Lab Results  Component Value Date   BUN 15 06/07/2020   BUN 15 06/06/2020   CREATININE 0.77 06/07/2020   CREATININE 0.94 06/06/2020   He has dyslipidemia: Lab Results  Component Value Date   CHOL 118 01/28/2020   HDL 30.60 (L) 01/28/2020   LDLCALC 75 01/28/2020   TRIG 65.0 01/28/2020   CHOLHDL 4 01/28/2020   -No previous eye exams. I recommended that he sees Dr. Randon Goldsmith and gave him his information. He did not see him yet.  -He denies numbness and tingling in his feet.  Pt has FH of DM in M, F, all 3  of his brothers - all have DM2.  ROS: Constitutional: + weight gain/no weight loss, no fatigue, no subjective hyperthermia, no subjective hypothermia Eyes: no blurry vision, no xerophthalmia ENT: no sore throat, no nodules palpated in neck, no dysphagia, no odynophagia, no hoarseness Cardiovascular: no CP/no SOB/no palpitations/no leg swelling Respiratory: no cough/no SOB/no wheezing Gastrointestinal: no N/no V/no D/no C/no acid reflux Musculoskeletal: no muscle aches/no joint aches Skin: no rashes, no hair loss Neurological: no tremors/no numbness/no tingling/no dizziness, + pain B legs after lifting at work  I reviewed pt's medications, allergies, PMH, social hx, family hx, and changes were documented in the history of present illness. Otherwise, unchanged from my initial visit note.  Past Medical History:  Diagnosis Date  . Allergy   . Diabetes mellitus without complication [facility])    Past Surgical History:  Procedure Laterality Date  . DENTAL SURGERY     Social History   Socioeconomic History  . Marital status: Single    Spouse name: Not on file  . Number of children:  To: 13 and 17 10/2008  . Years of education: Not on file  . Highest education level: Not on file  Occupational History  .  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 Financial resource strain: Not on file  . Food insecurity:    Worry: Not on file    Inability: Not on file  . Transportation needs:    Medical: Not on file    Non-medical: Not on file  Tobacco Use  . Smoking status: Former Smoker    Types: Cigarettes  . Smokeless tobacco: Never Used  Substance and Sexual Activity  . Alcohol use:  no    Alcohol/week:    Types:  Marland Kitchen Drug use: Not Currently  . Sexual activity: Not on file  Lifestyle  . Physical activity:    Days per week: Not on file    Minutes per session: Not on file  . Stress: Not on file  Relationships  . Social connections:    Talks on phone: Not on file    Gets  together: Not on file    Attends religious service: Not on file    Active member of club or organization: Not on file    Attends meetings of clubs or organizations: Not on file    Relationship status: Not on file  . Intimate partner violence:    Fear of current or ex partner: Not on file    Emotionally abused: Not on file    Physically abused: Not on file    Forced sexual activity: Not on file  Other  Topics Concern  . Not on file  Social History Narrative  . Not on file   Current Outpatient Medications on File Prior to Visit  Medication Sig Dispense Refill  . Continuous Blood Gluc Sensor (FREESTYLE LIBRE 14 DAY SENSOR) MISC For continuous glucose monitoring 6 each 2  . Glucagon (BAQSIMI ONE PACK) 3 MG/DOSE POWD Place 3 mg into the nose once as needed for up to 1 dose. 1 each prn  . insulin degludec (TRESIBA FLEXTOUCH) 200 UNIT/ML FlexTouch Pen Inject 40 Units into the skin daily. 6 pen 3  . Insulin Lispro-aabc, 1 U Dial, (LYUMJEV KWIKPEN) 100 UNIT/ML SOPN Inject 12-14 units 3x a day before meals (Patient taking differently: Inject 12-14 Units into the skin 3 (three) times daily with meals.) 5 pen 11  . Insulin Pen Needle 32G X 4 MM MISC Use 4-5x a day 300 each 3   No current facility-administered medications on file prior to visit.   Allergies  Allergen Reactions  . Peanut-Containing Drug Products Other (See Comments)    Throat burning    Family History  Problem Relation Age of Onset  . Diabetes Mellitus II Mother   . Diabetes Mellitus II Father   . Diabetes Mellitus II Brother    PE: BP 130/90   Pulse 95   Ht 6\' 3"  (1.905 m)   Wt 178 lb (80.7 kg)   SpO2 95%   BMI 22.25 kg/m  Wt Readings from Last 3 Encounters:  11/24/20 178 lb (80.7 kg)  09/24/20 167 lb 9.6 oz (76 kg)  06/04/20 182 lb 1.6 oz (82.6 kg)        Constitutional: normal weight, in NAD Eyes: PERRLA, EOMI, no exophthalmos ENT: moist mucous membranes, no thyromegaly, no cervical  lymphadenopathy Cardiovascular: Tachycardia, RR, No MRG Respiratory: CTA B Gastrointestinal: abdomen soft, NT, ND, BS+ Musculoskeletal: no deformities, strength intact in all 4 Skin: moist, warm, no rashes Neurological: no tremor with outstretched hands, DTR normal in all 4  ASSESSMENT: 1. DM1, insulin-dependent, uncontrolled, without long-term complications, but with DKA episodes  Component     Latest Ref Rng & Units 12/12/2018  Glucose, Plasma     65 - 99 mg/dL 12/14/2018 (H)  C-Peptide     0.80 - 3.85 ng/mL 0.52 (L)  Islet Cell Ab     Neg:<1:1 Negative  ZNT8 Antibodies     U/mL <15   2.  Dyslipidemia  PLAN:  1. Patient with longstanding, uncontrolled, insulin-dependent diabetes, diagnosed as type I in 2020, with history of DKA in the setting not affording his insulin. He has not been compliant with office visits and recommended treatment in the past. Before last visit, he reattached a CGM (freestyle libre 2), but she was not using this consistently, and I strongly advised her to keep it on as much as possible. We also discussed about correct injection techniques and I strongly advised him not to forget to take his insulin. He was forgetting 2021 approximately twice a week and was taking Lyumjev inconsistently. I also referred him to diabetes education but this was not covered. HbA1c at that time was still extremely high, more than 15%. -Since then, however patient sent me a message that sugars started to improve. She could not see nutrition/diabetes education since this was not covered by his insurance. CGM interpretation: -At today's visit, we reviewed her CGM downloads: It appears that 10% of values are in target range (goal >70%), which is actually an increase from 0% at last visit.  90%  are higher than 180 (goal <25%), and 0% are lower than 70 (goal <4%).  The calculated average blood sugar is still very high, 328.   -Reviewing the CGM trends, it appears that his sugars are still  almost all above target, but they get closer to the normal range.  However, the most prominent pattern is blood sugars dropping precipitously after correction of high blood sugar.  Upon questioning, he is correcting with 12 units of Lyumjev, and not following the sliding scale that I gave him.  This would limit his Lyumjev correction dose to 7 units even if the sugars are higher than 300.  I strongly advised him to use the sliding scale.  Also, upon questioning, he is not using the 2224 urine dose of Lyumjev, as recommended at last visit.  He is actually using 12 units.  I do not feel that this is enough and I advised him to increase the dose to 16-20 units.  -Otherwise, we can continue the same dose of Tresiba-refilled today - I suggested to:  Patient Instructions  Please continue: - Tresiba 40 units daily (in the abdomen)  Please change: - Lyumjev 16-20 units, depending on the size of the meal (in the abdomen, thighs or upper arms)  Use: - Lyumjev sliding scale: - 150-175: + 1 unit  - 176-200: + 2 units  - 201-225: + 3 units  - 226-250: + 4 units  - 251-275: + 5 units - 276-300: + 6 units >300: + 7 units  Please return in 2 months.  - we checked his HbA1c: 13.7% (better, but still very high) - advised to check sugars at different times of the day - 4x a day, rotating check times - advised for yearly eye exams >> he is not UTD - return to clinic in 2 months  2.  Dyslipidemia -Reviewed latest lipid panel from 01/2020: At goal with the exception of a low HDL: Lab Results  Component Value Date   CHOL 118 01/28/2020   HDL 30.60 (L) 01/28/2020   LDLCALC 75 01/28/2020   TRIG 65.0 01/28/2020   CHOLHDL 4 01/28/2020  -She started to work out at [employer] and to improve his diet. He is not on a statin.   Carlus Pavlov, MD PhD [facility] Endocrinology

## 2020-11-24 NOTE — Addendum Note (Signed)
Addended by: Kenyon Ana on: 11/24/2020 11:41 AM   Modules accepted: Orders

## 2020-11-24 NOTE — Patient Instructions (Addendum)
Please continue: - Tresiba 40 units daily (in the abdomen)  Please change: - Lyumjev 16-20 units, depending on the size of the meal (in the abdomen, thighs or upper arms)  Use: - Lyumjev sliding scale: - 150-175: + 1 unit  - 176-200: + 2 units  - 201-225: + 3 units  - 226-250: + 4 units  - 251-275: + 5 units - 276-300: + 6 units >300: + 7 units  Please return in 2 months.

## 2020-12-01 ENCOUNTER — Other Ambulatory Visit: Payer: Self-pay

## 2020-12-02 ENCOUNTER — Ambulatory Visit: Payer: Managed Care, Other (non HMO) | Admitting: Nurse Practitioner

## 2020-12-02 ENCOUNTER — Encounter: Payer: Self-pay | Admitting: Nurse Practitioner

## 2020-12-02 VITALS — BP 134/82 | HR 108 | Temp 97.3°F | Ht 75.0 in | Wt 172.6 lb

## 2020-12-02 DIAGNOSIS — M79605 Pain in left leg: Secondary | ICD-10-CM | POA: Diagnosis not present

## 2020-12-02 DIAGNOSIS — M79604 Pain in right leg: Secondary | ICD-10-CM

## 2020-12-02 DIAGNOSIS — M6281 Muscle weakness (generalized): Secondary | ICD-10-CM | POA: Diagnosis not present

## 2020-12-02 LAB — CBC WITH DIFFERENTIAL/PLATELET
Basophils Absolute: 0 10*3/uL (ref 0.0–0.1)
Basophils Relative: 0.4 % (ref 0.0–3.0)
Eosinophils Absolute: 0.1 10*3/uL (ref 0.0–0.7)
Eosinophils Relative: 2.3 % (ref 0.0–5.0)
HCT: 43.8 % (ref 39.0–52.0)
Hemoglobin: 14.2 g/dL (ref 13.0–17.0)
Lymphocytes Relative: 52.3 % — ABNORMAL HIGH (ref 12.0–46.0)
Lymphs Abs: 2.1 10*3/uL (ref 0.7–4.0)
MCHC: 32.3 g/dL (ref 30.0–36.0)
MCV: 86.3 fl (ref 78.0–100.0)
Monocytes Absolute: 0.4 10*3/uL (ref 0.1–1.0)
Monocytes Relative: 9.2 % (ref 3.0–12.0)
Neutro Abs: 1.4 10*3/uL (ref 1.4–7.7)
Neutrophils Relative %: 35.8 % — ABNORMAL LOW (ref 43.0–77.0)
Platelets: 272 10*3/uL (ref 150.0–400.0)
RBC: 5.08 Mil/uL (ref 4.22–5.81)
RDW: 13.1 % (ref 11.5–15.5)
WBC: 4 10*3/uL (ref 4.0–10.5)

## 2020-12-02 LAB — COMPREHENSIVE METABOLIC PANEL
ALT: 14 U/L (ref 0–53)
AST: 14 U/L (ref 0–37)
Albumin: 4.3 g/dL (ref 3.5–5.2)
Alkaline Phosphatase: 55 U/L (ref 39–117)
BUN: 14 mg/dL (ref 6–23)
CO2: 31 mEq/L (ref 19–32)
Calcium: 9.3 mg/dL (ref 8.4–10.5)
Chloride: 102 mEq/L (ref 96–112)
Creatinine, Ser: 0.85 mg/dL (ref 0.40–1.50)
GFR: 110.66 mL/min (ref >60.00)
Glucose, Bld: 232 mg/dL — ABNORMAL HIGH (ref 70–99)
Potassium: 4 mEq/L (ref 3.5–5.1)
Sodium: 138 mEq/L (ref 135–145)
Total Bilirubin: 1.3 mg/dL — ABNORMAL HIGH (ref 0.2–1.2)
Total Protein: 6.5 g/dL (ref 6.0–8.3)

## 2020-12-02 LAB — SEDIMENTATION RATE: Sed Rate: 10 mm/hr (ref 0–15)

## 2020-12-02 LAB — CK: Total CK: 72 U/L (ref 7–232)

## 2020-12-02 LAB — C-REACTIVE PROTEIN: CRP: <1 mg/dL (ref 0.5–20.0)

## 2020-12-02 MED ORDER — IBUPROFEN 600 MG PO TABS: 600.0000 mg | ORAL_TABLET | Freq: Three times a day (TID) | ORAL | 0 refills | Status: DC | PRN

## 2020-12-02 NOTE — Progress Notes (Signed)
Subjective:  Patient ID: Edward Walls, male    DOB: 18-Aug-1981  Age: 40 y.o. MRN: [id number]  CC: Leg Pain (C/O leg pains x 3 weeks )  Leg Pain  The incident occurred more than 1 week ago. There was no injury mechanism. The pain is present in the left leg and right leg. The quality of the pain is described as aching. The pain is severe. The pain has been constant since onset. Associated symptoms include muscle weakness. Pertinent negatives include no inability to bear weight, loss of motion, loss of sensation, numbness or tingling. He reports no foreign bodies present. The symptoms are aggravated by weight bearing and movement. He has tried acetaminophen for the symptoms. The treatment provided no relief.   Reviewed past Medical, Social and Family history today.  Outpatient Medications Prior to Visit  Medication Sig Dispense Refill  . Continuous Blood Gluc Sensor (FREESTYLE LIBRE 14 DAY SENSOR) MISC For continuous glucose monitoring 6 each 2  . Glucagon (BAQSIMI ONE PACK) 3 MG/DOSE POWD Place 3 mg into the nose once as needed for up to 1 dose. 1 each prn  . insulin degludec (TRESIBA FLEXTOUCH) 200 UNIT/ML FlexTouch Pen Inject 40 Units into the skin daily. 12 mL 3  . Insulin Lispro-aabc, 1 U Dial, (LYUMJEV KWIKPEN) 100 UNIT/ML SOPN Inject 12-14 units 3x a day before meals 5 pen 11  . Insulin Pen Needle 32G X 4 MM MISC Use 4-5x a day 300 each 3   No facility-administered medications prior to visit.    ROS See HPI  Objective:  BP 134/82   Pulse (!) 108   Temp (!) 97.3 F (36.3 C) (Temporal)   Ht 6\' 3"  (1.905 m)   Wt 172 lb 9.6 oz (78.3 kg)   SpO2 95%   BMI 21.57 kg/m   Physical Exam Vitals reviewed.  Cardiovascular:     Rate and Rhythm: Normal rate.     Pulses: Normal pulses.  Pulmonary:     Effort: Pulmonary effort is normal.  Musculoskeletal:        General: No swelling or tenderness. Normal range of motion.     Thoracic back: Normal.     Lumbar back: Normal.      Right hip: Decreased strength.     Left hip: Decreased strength.     Right upper leg: Normal.     Left upper leg: Normal.     Right knee: Normal.     Left knee: Normal.     Right lower leg: Normal. No edema.     Left lower leg: Normal. No edema.     Right ankle: Normal.     Right Achilles Tendon: Normal.     Left ankle: Normal.     Left Achilles Tendon: Normal.     Right foot: Normal.     Left foot: Normal.  Skin:    General: Skin is warm and dry.     Findings: No erythema or rash.  Neurological:     Mental Status: He is alert and oriented to person, place, and time.    Assessment & Plan:  This visit occurred during the SARS-CoV-2 public health emergency.  Safety protocols were in place, including screening questions prior to the visit, additional usage of staff PPE, and extensive cleaning of exam room while observing appropriate contact time as indicated for disinfecting solutions.   Catcher was seen today for leg pain.  Diagnoses and all orders for this visit:  Pain in both lower  extremities -     CK (Creatine Kinase) -     Sedimentation rate -     C-reactive protein -     CBC with Differential/Platelet -     ibuprofen (ADVIL) 600 MG tablet; Take 1 tablet (600 mg total) by mouth every 8 (eight) hours as needed. With food -     Comprehensive metabolic panel  Muscle weakness of lower extremity -     CK (Creatine Kinase) -     Sedimentation rate -     C-reactive protein -     CBC with Differential/Platelet -     ibuprofen (ADVIL) 600 MG tablet; Take 1 tablet (600 mg total) by mouth every 8 (eight) hours as needed. With food -     Comprehensive metabolic panel   Problem List Items Addressed This Visit   None   Visit Diagnoses    Pain in both lower extremities    -  Primary   Relevant Medications   ibuprofen (ADVIL) 600 MG tablet   Other Relevant Orders   CK (Creatine Kinase) (Completed)   Sedimentation rate (Completed)   C-reactive protein (Completed)   CBC with  Differential/Platelet (Completed)   Comprehensive metabolic panel (Completed)   Muscle weakness of lower extremity       Relevant Medications   ibuprofen (ADVIL) 600 MG tablet   Other Relevant Orders   CK (Creatine Kinase) (Completed)   Sedimentation rate (Completed)   C-reactive protein (Completed)   CBC with Differential/Platelet (Completed)   Comprehensive metabolic panel (Completed)      Follow-up: No follow-ups on file.  Alysia Penna, NP

## 2020-12-02 NOTE — Patient Instructions (Signed)
Go to lab for blood draw Start ibuprofen 600mg  for pain.

## 2020-12-06 ENCOUNTER — Encounter: Payer: Self-pay | Admitting: Internal Medicine

## 2020-12-06 ENCOUNTER — Encounter: Payer: Self-pay | Admitting: Nurse Practitioner

## 2020-12-06 DIAGNOSIS — M79605 Pain in left leg: Secondary | ICD-10-CM

## 2020-12-06 DIAGNOSIS — M79604 Pain in right leg: Secondary | ICD-10-CM

## 2020-12-06 MED ORDER — GABAPENTIN 100 MG PO CAPS: 100.0000 mg | ORAL_CAPSULE | Freq: Every day | ORAL | 5 refills | Status: DC

## 2020-12-07 ENCOUNTER — Other Ambulatory Visit: Payer: Self-pay

## 2020-12-07 ENCOUNTER — Ambulatory Visit (INDEPENDENT_AMBULATORY_CARE_PROVIDER_SITE_OTHER): Payer: Managed Care, Other (non HMO)

## 2020-12-07 ENCOUNTER — Other Ambulatory Visit: Payer: Managed Care, Other (non HMO)

## 2020-12-07 DIAGNOSIS — M79604 Pain in right leg: Secondary | ICD-10-CM

## 2020-12-07 DIAGNOSIS — M79605 Pain in left leg: Secondary | ICD-10-CM | POA: Diagnosis not present

## 2020-12-07 NOTE — Telephone Encounter (Signed)
Pt scheduled for today.  X-ray.

## 2020-12-08 ENCOUNTER — Other Ambulatory Visit: Payer: Self-pay | Admitting: Nurse Practitioner

## 2020-12-08 DIAGNOSIS — M79604 Pain in right leg: Secondary | ICD-10-CM

## 2020-12-08 DIAGNOSIS — M6281 Muscle weakness (generalized): Secondary | ICD-10-CM

## 2020-12-08 DIAGNOSIS — M79605 Pain in left leg: Secondary | ICD-10-CM

## 2020-12-10 NOTE — Telephone Encounter (Signed)
Please see message and advise.  Thank you. ° °

## 2020-12-14 ENCOUNTER — Ambulatory Visit: Payer: Managed Care, Other (non HMO) | Admitting: Neurology

## 2020-12-14 ENCOUNTER — Encounter: Payer: Self-pay | Admitting: Neurology

## 2020-12-14 VITALS — BP 128/84 | HR 92 | Ht 75.0 in | Wt 172.5 lb

## 2020-12-14 DIAGNOSIS — E1342 Other specified diabetes mellitus with diabetic polyneuropathy: Secondary | ICD-10-CM | POA: Diagnosis not present

## 2020-12-14 DIAGNOSIS — M792 Neuralgia and neuritis, unspecified: Secondary | ICD-10-CM

## 2020-12-14 DIAGNOSIS — R202 Paresthesia of skin: Secondary | ICD-10-CM | POA: Insufficient documentation

## 2020-12-14 DIAGNOSIS — E1140 Type 2 diabetes mellitus with diabetic neuropathy, unspecified: Secondary | ICD-10-CM | POA: Insufficient documentation

## 2020-12-14 MED ORDER — GABAPENTIN 300 MG PO CAPS: 600.0000 mg | ORAL_CAPSULE | Freq: Three times a day (TID) | ORAL | 11 refills | Status: DC

## 2020-12-14 NOTE — Progress Notes (Signed)
Chief Complaint  Patient presents with  . New Patient (Initial Visit)    Referred for evaluation of pain and muscle weakness in his bilateral lower extremities. Shocking sensations down legs and into feet. He woke up with symptoms around one month ago. Denies any known injury. He has only had x-ray of lumbar spine. He was given a prescription for gabapentin 100mg , 1-2 caps QHS. He has taken as much as 300mg  at bedtime but even the higher dose was not helpful. His pain is worse after lying and sitting for prolonged periods of time.     HISTORICAL  Edward Walls 40 year old male, seen in request by Dr. [name], 32 for evaluation of bilateral lower extremity paresthesia, initial evaluation was on December 14, 2020  I reviewed and summarized the referring note. 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, third shift from 6 PM to 6 AM, early February 2022, he noticed fairly acute onset bilateral feet numbness tingling, initially involving toes, bottom of his feet, swollen sensation, within couple days, ascending above his ankle, numbness tingling burning sensation, and quickly go up to bilateral knees, now he feels sometimes in his mid thigh area  He has intermittent low back pain, but denies shooting pain, I personally reviewed the lumbar x-ray February 2022 there was no significant abnormality  He denies bilateral fingertips paresthesia or weakness, but complains of increased fatigue, difficulty sleeping because of bilateral lower extremity burning pain stinging sensation, decreased appetite, also a week history of new onset constipation.  He was given gabapentin 100 mg a week ago, without helping his symptoms,  He also complains of a week history of gait abnormality, bilateral foot pain when bearing weight, also felt  unsteady sensation when closing his eyes    REVIEW OF SYSTEMS: Full 14 system review of systems performed and notable only for as above All other review of systems were negative.  ALLERGIES: Allergies  Allergen Reactions  . Peanut-Containing Drug Products Other (See Comments)    Throat burning     HOME MEDICATIONS: Current Outpatient Medications  Medication Sig Dispense Refill  . Continuous Blood Gluc Sensor (FREESTYLE LIBRE 14 DAY SENSOR) MISC For continuous glucose monitoring 6 each 2  . gabapentin (NEURONTIN) 100 MG capsule Take 1-2 capsules (100-200 mg total) by mouth at bedtime. 60 capsule 5  . Glucagon (BAQSIMI ONE PACK) 3 MG/DOSE POWD Place 3 mg into the nose once as needed for up to 1 dose. 1 each prn  . ibuprofen (ADVIL) 600 MG tablet Take 1 tablet (600 mg total) by mouth every 8 (eight) hours as needed. With food 30 tablet 0  . insulin degludec (TRESIBA FLEXTOUCH) 200 UNIT/ML FlexTouch Pen Inject 40 Units into the skin daily. 12 mL 3  . Insulin Lispro-aabc, 1 U Dial, (LYUMJEV KWIKPEN) 100 UNIT/ML SOPN Inject 12-14 units 3x a day before meals 5 pen 11  . Insulin Pen Needle 32G X 4 MM MISC Use 4-5x a day 300 each 3   No current facility-administered medications for this visit.    PAST MEDICAL HISTORY: Past Medical History:  Diagnosis Date  . Diabetes mellitus without complication (HCC)   . Leg pain   . Leg weakness   . Seasonal allergies     PAST SURGICAL HISTORY: Past Surgical History:  Procedure Laterality Date  .  DENTAL SURGERY      FAMILY HISTORY: Family History  Problem Relation Age of Onset  . Diabetes Mellitus II Mother   . Diabetes Mellitus II Father   . Diabetes Mellitus II Brother     SOCIAL HISTORY: Social History   Socioeconomic History  . Marital status: Single    Spouse name: Not on file  . Number of children: 2  . Years of education: 62  . Highest education level: High school graduate  Occupational History  . Not on file  Tobacco  Use  . Smoking status: Former Smoker    Types: Cigarettes  . Smokeless tobacco: Never Used  Substance and Sexual Activity  . Alcohol use: Yes    Alcohol/week: 0.0 standard drinks    Comment: occasional   . Drug use: Never  . Sexual activity: Not on file  Other Topics Concern  . Not on file  Social History Narrative   Lives at home with girlfriend.   Right-handed.   Caffeine use: occasional use   Social Determinants of Corporate investment banker Strain: Not on file  Food Insecurity: Not on file  Transportation Needs: Not on file  Physical Activity: Not on file  Stress: Not on file  Social Connections: Not on file  Intimate Partner Violence: Not on file     PHYSICAL EXAM   Vitals:   12/14/20 0738  BP: 128/84  Pulse: 92  Weight: 172 lb 8 oz (78.2 kg)  Height: 6\' 3"  (1.905 m)   Not recorded     Body mass index is 21.56 kg/m.  PHYSICAL EXAMNIATION:  Gen: NAD, conversant, well nourised, well groomed                     Cardiovascular: Regular rate rhythm, no peripheral edema, warm, nontender. Eyes: Conjunctivae clear without exudates or hemorrhage Neck: Supple, no carotid bruits. Pulmonary: Clear to auscultation bilaterally   NEUROLOGICAL EXAM:  MENTAL STATUS: Speech:    Speech is normal; fluent and spontaneous with normal comprehension.  Cognition:     Orientation to time, place and person     Normal recent and remote memory     Normal Attention span and concentration     Normal Language, naming, repeating,spontaneous speech     Fund of knowledge   CRANIAL NERVES: CN II: Visual fields are full to confrontation. Pupils are round equal and briskly reactive to light. CN III, IV, VI: extraocular movement are normal. No ptosis. CN V: Facial sensation is intact to light touch CN VII: Face is symmetric with normal eye closure  CN VIII: Hearing is normal to causal conversation. CN IX, X: Phonation is normal. CN XI: Head turning and shoulder shrug are  intact  MOTOR: He has mild bilateral toe extension, flexion weakness, no proximal lower extremity muscle weakness, bilateral upper extremity proximal and distal muscle strength is normal.  REFLEXES: Reflexes are 2+ and symmetric at the biceps, triceps, knees, and absent at ankles. Plantar responses are flexor.  SENSORY: Preserved bilateral toes vibratory sensation, proprioception, allodynia to pinprick  COORDINATION: There is no trunk or limb dysmetria noted.  GAIT/STANCE: Mildly antalgic, unsteady, difficulty performing tiptoe, heel walking, mild positive Romberg signs  DIAGNOSTIC DATA (LABS, IMAGING, TESTING) - I reviewed patient records, labs, notes, testing and imaging myself where available.   ASSESSMENT AND PLAN  LOEL BETANCUR is a 40 y.o. 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 Ph.D.  [facility] [address], [address] [place], [place] [address] Ph: [phone] Fax: [phone]  CC:  Mliss Sax, MD [address] [place],  [place] [address]

## 2020-12-16 ENCOUNTER — Encounter (INDEPENDENT_AMBULATORY_CARE_PROVIDER_SITE_OTHER): Payer: Managed Care, Other (non HMO) | Admitting: Neurology

## 2020-12-16 ENCOUNTER — Ambulatory Visit (INDEPENDENT_AMBULATORY_CARE_PROVIDER_SITE_OTHER): Payer: Managed Care, Other (non HMO) | Admitting: Neurology

## 2020-12-16 DIAGNOSIS — M792 Neuralgia and neuritis, unspecified: Secondary | ICD-10-CM | POA: Diagnosis not present

## 2020-12-16 DIAGNOSIS — R202 Paresthesia of skin: Secondary | ICD-10-CM

## 2020-12-16 DIAGNOSIS — E1342 Other specified diabetes mellitus with diabetic polyneuropathy: Secondary | ICD-10-CM | POA: Diagnosis not present

## 2020-12-16 DIAGNOSIS — Z0289 Encounter for other administrative examinations: Secondary | ICD-10-CM

## 2020-12-16 LAB — MULTIPLE MYELOMA PANEL, SERUM
Albumin SerPl Elph-Mcnc: 3.9 g/dL (ref 2.9–4.4)
Albumin/Glob SerPl: 1.4 (ref 0.7–1.7)
Alpha 1: 0.2 g/dL (ref 0.0–0.4)
Alpha2 Glob SerPl Elph-Mcnc: 0.9 g/dL (ref 0.4–1.0)
B-Globulin SerPl Elph-Mcnc: 1.1 g/dL (ref 0.7–1.3)
Gamma Glob SerPl Elph-Mcnc: 0.8 g/dL (ref 0.4–1.8)
Globulin, Total: 2.9 g/dL (ref 2.2–3.9)
IgA/Immunoglobulin A, Serum: 310 mg/dL (ref 90–386)
IgG (Immunoglobin G), Serum: 812 mg/dL (ref 603–1613)
IgM (Immunoglobulin M), Srm: 37 mg/dL (ref 20–172)
Total Protein: 6.8 g/dL (ref 6.0–8.5)

## 2020-12-16 LAB — VITAMIN B12: Vitamin B-12: 428 pg/mL (ref 232–1245)

## 2020-12-16 LAB — CK: Total CK: 82 U/L (ref 49–439)

## 2020-12-16 LAB — RPR: RPR Ser Ql: NONREACTIVE

## 2020-12-16 LAB — SEDIMENTATION RATE: Sed Rate: 22 mm/hr — ABNORMAL HIGH (ref 0–15)

## 2020-12-16 LAB — TSH: TSH: 1.39 u[IU]/mL (ref 0.450–4.500)

## 2020-12-16 LAB — ANA W/REFLEX IF POSITIVE: Anti Nuclear Antibody (ANA): NEGATIVE

## 2020-12-16 LAB — HIV ANTIBODY (ROUTINE TESTING W REFLEX): HIV Screen 4th Generation wRfx: NONREACTIVE

## 2020-12-16 LAB — C-REACTIVE PROTEIN: CRP: <1 mg/L (ref 0–10)

## 2020-12-16 LAB — VITAMIN D 25 HYDROXY (VIT D DEFICIENCY, FRACTURES): Vit D, 25-Hydroxy: 21.6 ng/mL — ABNORMAL LOW (ref 30.0–100.0)

## 2020-12-16 NOTE — Addendum Note (Signed)
Addended by: Levert Feinstein on: 12/16/2020 02:17 PM   Modules accepted: Level of Service

## 2020-12-16 NOTE — Progress Notes (Addendum)
HISTORICAL  Edward Walls 40 year old male, seen in request by Dr. Ethelene Hal, [name] for evaluation of bilateral lower extremity paresthesia, initial evaluation was on December 14, 2020  I reviewed and summarized the referring note. He was diagnosed with insulin-dependent diabetes since 2018, always under suboptimal control, A1c was in the range of 13.7-17.2, most recent one was November 24, 2020 was at his past 13.7  He worked at [employer] job, third shift from 6 PM to 6 AM, early February 2022, he noticed fairly acute onset bilateral feet numbness tingling, initially involving toes, bottom of his feet, swollen sensation, within couple days, ascending above his ankle, numbness tingling burning sensation, and quickly go up to bilateral knees, now he feels sometimes in his mid thigh area  He has intermittent low back pain, but denies shooting pain, I personally reviewed the lumbar x-ray February 2022 there was no significant abnormality  He denies bilateral fingertips paresthesia or weakness, but complains of increased fatigue, difficulty sleeping because of bilateral lower extremity burning pain stinging sensation, decreased appetite, also a week history of new onset constipation.  He was given gabapentin 100 mg a week ago, without helping his symptoms,  He also complains of a week history of gait abnormality, bilateral foot pain when bearing weight, also felt unsteady sensation when closing his eyes  Update December 16 2020 He reported at least mild to moderate improvement with higher dose of gabapentin 300 mg 2 tablets 3 times a day, he can sleep better, but complains of daytime drowsiness fatigue with higher dose  He continues to have significant bilateral foot plantars surface, top, distal leg paresthesia, burning discomfort  EMG nerve conduction study today showed no evidence of large fiber peripheral neuropathy, the mildly prolonged motor F-wave latency and upper extremity sensory peak  latency are  most likely related to his tall status,  Laboratory evaluations showed normal or negative protein electrophoresis, ANA, CPK, B12, RPR HIV, C-reactive protein, TSH  Evidence of vitamin D deficiency 21.6, mild elevated ESR 22 in the setting of normal C-reactive protein   REVIEW OF SYSTEMS: Full 14 system review of systems performed and notable only for as above All other review of systems were negative.  ALLERGIES: Allergies  Allergen Reactions  . Peanut-Containing Drug Products Other (See Comments)    Throat burning     HOME MEDICATIONS: Current Outpatient Medications  Medication Sig Dispense Refill  . Continuous Blood Gluc Sensor (FREESTYLE LIBRE 14 DAY SENSOR) MISC For continuous glucose monitoring 6 each 2  . gabapentin (NEURONTIN) 100 MG capsule Take 1-2 capsules (100-200 mg total) by mouth at bedtime. 60 capsule 5  . gabapentin (NEURONTIN) 300 MG capsule Take 2 capsules (600 mg total) by mouth 3 (three) times daily. 180 capsule 11  . Glucagon (BAQSIMI ONE PACK) 3 MG/DOSE POWD Place 3 mg into the nose once as needed for up to 1 dose. 1 each prn  . ibuprofen (ADVIL) 600 MG tablet Take 1 tablet (600 mg total) by mouth every 8 (eight) hours as needed. With food 30 tablet 0  . insulin degludec (TRESIBA FLEXTOUCH) 200 UNIT/ML FlexTouch Pen Inject 40 Units into the skin daily. 12 mL 3  . Insulin Lispro-aabc, 1 U Dial, (LYUMJEV KWIKPEN) 100 UNIT/ML SOPN Inject 12-14 units 3x a day before meals 5 pen 11  . Insulin Pen Needle 32G X 4 MM MISC Use 4-5x a day 300 each 3   No current facility-administered medications for this visit.    PAST MEDICAL  HISTORY: Past Medical History:  Diagnosis Date  . Diabetes mellitus without complication ([place])   . Leg pain   . Leg weakness   . Seasonal allergies     PAST SURGICAL HISTORY: Past Surgical History:  Procedure Laterality Date  . DENTAL SURGERY      FAMILY HISTORY: Family History  Problem Relation Age of Onset  . Diabetes  Mellitus II Mother   . Diabetes Mellitus II Father   . Diabetes Mellitus II Brother     SOCIAL HISTORY: Social History   Socioeconomic History  . Marital status: Single    Spouse name: Not on file  . Number of children: 2  . Years of education: 5  . Highest education level: High school graduate  Occupational History  . Not on file  Tobacco Use  . Smoking status: Former Smoker    Types: Cigarettes  . Smokeless tobacco: Never Used  Substance and Sexual Activity  . Alcohol use: Yes    Alcohol/week: 0.0 standard drinks    Comment: occasional   . Drug use: Never  . Sexual activity: Not on file  Other Topics Concern  . Not on file  Social History Narrative   Lives at home with girlfriend.   Right-handed.   Caffeine use: occasional use   Social Determinants of Radio broadcast assistant Strain: Not on file  Food Insecurity: Not on file  Transportation Needs: Not on file  Physical Activity: Not on file  Stress: Not on file  Social Connections: Not on file  Intimate Partner Violence: Not on file     PHYSICAL EXAM   There were no vitals filed for this visit. Not recorded     There is no height or weight on file to calculate BMI.  PHYSICAL EXAMNIATION:  Gen: NAD, conversant, well nourised, well groomed          MENTAL STATUS: Speech/cognition: Awake, alert, oriented to history taking and casual conversation   CRANIAL NERVES: CN II: Visual fields are full to confrontation. Pupils are round equal and briskly reactive to light. CN III, IV, VI: extraocular movement are normal. No ptosis. CN V: Facial sensation is intact to light touch CN VII: Face is symmetric with normal eye closure  CN VIII: Hearing is normal to causal conversation. CN IX, X: Phonation is normal. CN XI: Head turning and shoulder shrug are intact  MOTOR: He has mild bilateral toe extension, flexion weakness, no proximal lower extremity muscle weakness, bilateral upper extremity proximal and  distal muscle strength is normal.  REFLEXES: Reflexes are 2+ and symmetric at the biceps, triceps, knees, and absent at ankles. Plantar responses are flexor.  SENSORY: Preserved bilateral toes vibratory sensation, proprioception, allodynia to pinprick  COORDINATION: There is no trunk or limb dysmetria noted.  GAIT/STANCE: Mildly antalgic, unsteady, difficulty performing tiptoe, heel walking due to pain   DIAGNOSTIC DATA (LABS, IMAGING, TESTING) - I reviewed patient records, labs, notes, testing and imaging myself where available.   ASSESSMENT AND PLAN  AVANTAE BITHER is a 40 y.o. 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 Ph.D.  [facility] [address], [address]  [place], [place] [address] Ph: [phone] Fax: [phone]  CC:  [name], [place],  [place] [id number]

## 2020-12-16 NOTE — Procedures (Signed)
Full Name: Keno Caraway Gender: Male MRN #: [id number] Date of Birth: [birth year]    Visit Date: 12/16/2020 09:40 Age: 40 Years Examining Physician: Levert Feinstein, MD  Referring Physician: Levert Feinstein, MD Height: 6 feet 4 History: 40 year old male with poorly controlled insulin-dependent diabetes, presented with subacute onset ascending paresthesia to mid spine level,  Summary of the test: Nerve conduction study: Right sural, superficial peroneal sensory responses were normal.  Right median and ulnar sensory responses showed well-preserved snap amplitude, with mildly prolonged peak latency, this was in the setting of adjustment of stimulation distance due to his elongated hands (the distance for simulation was prolonged compared to standard stimulation distance).  Bilateral tibial motor responses showed normal distal latency, CMAP amplitude, conduction velocity, no evidence of temporal dispersion, but with significantly prolonged bilateral F-wave latency, this is most likely related to his tall status  Right median, ulnar motor responses was normal as well, with exception of moderately prolonged right ulnar F-wave latency.  Electromyography: Selected needle examination of right lower extremity muscles, right lumbosacral paraspinal; right upper extremity muscles and right cervical paraspinal muscles were normal.  Conclusion: This is essentially normal study.  There is no evidence of large fiber peripheral neuropathy.  The mildly prolonged peak latency on right median, ulnar sensory response, and the mild to moderately prolonged motor F-wave latency are most likely related to his tall status.    ------------------------------- Levert Feinstein M.D. PhD  [facility] [address], [address] [place], [place] [address] Tel: [phone] Fax: [phone]  Verbal informed consent was obtained from the patient, patient was informed of potential risk of procedure, including  bruising, bleeding, hematoma formation, infection, muscle weakness, muscle pain, numbness, among others.        MNC    Nerve / Sites Muscle Latency Ref. Amplitude Ref. Rel Amp Segments Distance Velocity Ref. Area    ms ms mV mV %  cm m/s m/s mVms  R Median - APB     Wrist APB 4.1 ?4.4 7.8 ?4.0 100 Wrist - APB 7   29.1     Upper arm APB 9.6  7.4  95.8 Upper arm - Wrist 27 49 ?49 29.2  R Ulnar - ADM     Wrist ADM 3.1 ?3.3 7.5 ?6.0 100 Wrist - ADM 7   32.0     B.Elbow ADM 8.0  6.5  86.7 B.Elbow - Wrist 24 49 ?49 30.6     A.Elbow ADM 10.0  5.7  88 A.Elbow - B.Elbow 10 49 ?49 29.7  R Peroneal - EDB     Ankle EDB 4.7 ?6.5 3.7 ?2.0 100 Ankle - EDB 9   14.6     Fib head EDB 12.4  3.3  88.6 Fib head - Ankle 34 44 ?44 15.8     Pop fossa EDB 14.7  3.5  106 Pop fossa - Fib head 10 44 ?44 14.2         Pop fossa - Ankle      R Tibial - AH     Ankle AH 5.1 ?5.8 5.2 ?4.0 100 Ankle - AH 9   12.3     Pop fossa AH 15.5  4.7  90.5 Pop fossa - Ankle 42 41 ?41 13.3  L Tibial - AH     Ankle AH 4.9 ?5.8 6.7 ?4.0 100 Ankle - AH 9   14.3     Pop fossa AH 14.8  4.9  73.4 Pop fossa -  Ankle 40 41 ?41 13.7               SNC    Nerve / Sites Rec. Site Peak Lat Ref.  Amp Ref. Segments Distance    ms ms V V  cm  R Sural - Ankle (Calf)     Calf Ankle 4.0 ?4.4 9 ?6 Calf - Ankle 14  R Superficial peroneal - Ankle     Lat leg Ankle 4.1 ?4.4 8 ?6 Lat leg - Ankle 14  R Median - Orthodromic (Dig II, Mid palm)     Dig II Wrist 3.6 ?3.4 12 ?10 Dig II - Wrist 14  R Ulnar - Orthodromic, (Dig V, Mid palm)     Dig V Wrist 3.3 ?3.1 10 ?5 Dig V - Wrist 1             F  Wave    Nerve F Lat Ref.   ms ms  R Tibial - AH 69.9 ?56.0  R Ulnar - ADM 36.7 ?32.0  L Tibial - AH 72.2 ?56.0           EMG Summary Table    Spontaneous MUAP Recruitment  Muscle IA Fib PSW Fasc Other Amp Dur. Poly Pattern  R. Tibialis anterior Normal None None None _______ Normal Normal Normal Normal  R. Tibialis posterior Normal None None  None _______ Normal Normal Normal Normal  R. Peroneus longus Normal None None None _______ Normal Normal Normal Normal  R. Gastrocnemius (Medial head) Normal None None None _______ Normal Normal Normal Normal  R. Vastus lateralis Normal None None None _______ Normal Normal Normal Normal  R. Lumbar paraspinals (low) Normal None None None _______ Normal Normal Normal Normal  R. Lumbar paraspinals (mid) Normal None None None _______ Normal Normal Normal Normal  R. First dorsal interosseous Normal None None None _______ Normal Normal Normal Normal  R. Pronator teres Normal None None None _______ Normal Normal Normal Normal  R. Biceps brachii Normal None None None _______ Normal Normal Normal Normal  R. Deltoid Normal None None None _______ Normal Normal Normal Normal  R. Brachioradialis Normal None None None _______ Normal Normal Normal Normal  R. Cervical paraspinals Normal None None None _______ Normal Normal Normal Normal

## 2020-12-20 ENCOUNTER — Telehealth: Payer: Self-pay | Admitting: Neurology

## 2020-12-20 NOTE — Telephone Encounter (Signed)
Please call patient, laboratory evaluation showed low vitamin D level 21, we would benefit over-the-counter D3 supplement 1000 mg daily  Mild elevated ESR has unknown significance significance.  Rest of the laboratory evaluation showed no significant abnormalities.

## 2020-12-20 NOTE — Telephone Encounter (Signed)
I spoke to the patient and provided him with the lab results. He is agreeable to start the recommended vitamin D3 supplement.

## 2020-12-24 ENCOUNTER — Ambulatory Visit: Payer: Managed Care, Other (non HMO) | Admitting: Internal Medicine

## 2021-01-03 ENCOUNTER — Encounter: Payer: Self-pay | Admitting: *Deleted

## 2021-01-03 ENCOUNTER — Other Ambulatory Visit: Payer: Self-pay | Admitting: *Deleted

## 2021-01-03 MED ORDER — DULOXETINE HCL 60 MG PO CPEP: 60.0000 mg | ORAL_CAPSULE | Freq: Every day | ORAL | 11 refills | Status: DC

## 2021-01-03 MED ORDER — GABAPENTIN 300 MG PO CAPS: 900.0000 mg | ORAL_CAPSULE | Freq: Three times a day (TID) | ORAL | 11 refills | Status: DC

## 2021-01-03 NOTE — Telephone Encounter (Addendum)
I called the patient back.  Per vo by Dr. Terrace Arabia, he can continue the higher dose of gabapentin 300mg , three capsules TID. She will also add duloxentine 60mg , one capsule daily. He is agreeable to try this treatment regimen. He has a pending follow up on 02/28/21. He will contact our office back earlier if these changes are not helpful. He is also aware to not make any other medication changes without checking with first. New prescriptions sent to pharmacy and to MD to [facility].   Per vo by Dr. [place], next step would be to replace gabapentin with pregablin 150mg , one capsule TID.

## 2021-01-14 NOTE — Telephone Encounter (Signed)
I called the patient. Reports the constipation and sexual arousal issues really started with gabapentin (prior to starting duloxetine). However, his neuropathy is being so well controlled with these medications, he does wish to make any changes at this time. I told him he could try increasing his fluids, increasing his fiber, exercise regularly as tolerated to help with his constipation. He may even try an OTC mild stool softener. He is going to speak to his PCP about his other concern.

## 2021-01-19 ENCOUNTER — Encounter: Payer: Self-pay | Admitting: Nurse Practitioner

## 2021-01-31 ENCOUNTER — Ambulatory Visit: Payer: Managed Care, Other (non HMO) | Admitting: Internal Medicine

## 2021-02-28 ENCOUNTER — Ambulatory Visit: Payer: Self-pay | Admitting: Neurology

## 2021-04-19 ENCOUNTER — Other Ambulatory Visit: Payer: Self-pay | Admitting: Internal Medicine

## 2021-05-04 ENCOUNTER — Other Ambulatory Visit: Payer: Self-pay | Admitting: Internal Medicine

## 2021-05-09 ENCOUNTER — Other Ambulatory Visit: Payer: Self-pay | Admitting: Internal Medicine

## 2021-05-18 ENCOUNTER — Other Ambulatory Visit: Payer: Self-pay

## 2021-05-19 ENCOUNTER — Ambulatory Visit: Payer: Managed Care, Other (non HMO) | Admitting: Family Medicine

## 2021-05-19 ENCOUNTER — Encounter: Payer: Self-pay | Admitting: Family Medicine

## 2021-05-19 VITALS — BP 114/76 | HR 108 | Temp 98.0°F | Ht 75.0 in | Wt 163.0 lb

## 2021-05-19 DIAGNOSIS — E139 Other specified diabetes mellitus without complications: Secondary | ICD-10-CM | POA: Diagnosis not present

## 2021-05-19 DIAGNOSIS — Z0000 Encounter for general adult medical examination without abnormal findings: Secondary | ICD-10-CM | POA: Diagnosis not present

## 2021-05-19 LAB — MICROALBUMIN / CREATININE URINE RATIO
Creatinine,U: 84.8 mg/dL
Microalb Creat Ratio: 2.4 mg/g (ref 0.0–30.0)
Microalb, Ur: 2 mg/dL — ABNORMAL HIGH (ref 0.0–1.9)

## 2021-05-19 LAB — URINALYSIS, ROUTINE W REFLEX MICROSCOPIC
Bilirubin Urine: NEGATIVE
Hgb urine dipstick: NEGATIVE
Ketones, ur: 15 — AB
Leukocytes,Ua: NEGATIVE
Nitrite: NEGATIVE
RBC / HPF: NONE SEEN (ref 0–?)
Specific Gravity, Urine: 1.01 (ref 1.000–1.030)
Total Protein, Urine: NEGATIVE
Urine Glucose: >=1000 — AB
Urobilinogen, UA: 0.2 (ref 0.0–1.0)
WBC, UA: NONE SEEN (ref 0–?)
pH: 6 (ref 5.0–8.0)

## 2021-05-19 LAB — LIPID PANEL
Cholesterol: 141 mg/dL (ref 0–200)
HDL: 29.3 mg/dL — ABNORMAL LOW (ref >39.00)
LDL Cholesterol: 93 mg/dL (ref 0–99)
NonHDL: 111.76
Total CHOL/HDL Ratio: 5
Triglycerides: 94 mg/dL (ref 0.0–149.0)
VLDL: 18.8 mg/dL (ref 0.0–40.0)

## 2021-05-19 LAB — COMPREHENSIVE METABOLIC PANEL
ALT: 21 U/L (ref 0–53)
AST: 18 U/L (ref 0–37)
Albumin: 4 g/dL (ref 3.5–5.2)
Alkaline Phosphatase: 94 U/L (ref 39–117)
BUN: 13 mg/dL (ref 6–23)
CO2: 24 mEq/L (ref 19–32)
Calcium: 9.1 mg/dL (ref 8.4–10.5)
Chloride: 99 mEq/L (ref 96–112)
Creatinine, Ser: 0.81 mg/dL (ref 0.40–1.50)
GFR: 111.92 mL/min (ref >60.00)
Glucose, Bld: 319 mg/dL — ABNORMAL HIGH (ref 70–99)
Potassium: 4.1 mEq/L (ref 3.5–5.1)
Sodium: 133 mEq/L — ABNORMAL LOW (ref 135–145)
Total Bilirubin: 0.7 mg/dL (ref 0.2–1.2)
Total Protein: 6.7 g/dL (ref 6.0–8.3)

## 2021-05-19 LAB — CBC
HCT: 41.7 % (ref 39.0–52.0)
Hemoglobin: 13.5 g/dL (ref 13.0–17.0)
MCHC: 32.3 g/dL (ref 30.0–36.0)
MCV: 86.4 fl (ref 78.0–100.0)
Platelets: 294 10*3/uL (ref 150.0–400.0)
RBC: 4.83 Mil/uL (ref 4.22–5.81)
RDW: 13.8 % (ref 11.5–15.5)
WBC: 4.2 10*3/uL (ref 4.0–10.5)

## 2021-05-19 MED ORDER — FREESTYLE LIBRE 14 DAY SENSOR MISC: 1 refills | Status: DC

## 2021-05-19 NOTE — Progress Notes (Signed)
Anterior comes his AVS  Established Patient Office Visit  Subjective:  Patient ID: Edward Walls, male    DOB: 05/18/1981  Age: 40 y.o. MRN: [id number]  CC:  Chief Complaint  Patient presents with   Medication Refill    Refill/follow up on medications. Patient fasting.     HPI Edward Walls presents for follow-up of his diabetes.  He needs a refill on his freestyle libre CGM.  Last seen by endocrinology in February.  Hemoglobin A1c was 13.7.  He did not schedule a follow-up appointment.  He does say that the paresthesias in his feet have improved.  Admits that he has gotten off track.  Continues to work third shift.  Maintains compliance with his prescribed insulins.  He has follow-up scheduled with endocrinology next month.  He has not had a diabetic eye exam.  Past Medical History:  Diagnosis Date   Diabetes mellitus without complication (HCC)    Leg pain    Leg weakness    Seasonal allergies     Past Surgical History:  Procedure Laterality Date   DENTAL SURGERY      Family History  Problem Relation Age of Onset   Diabetes Mellitus II Mother    Diabetes Mellitus II Father    Diabetes Mellitus II Brother     Social History   Socioeconomic History   Marital status: Single    Spouse name: Not on file   Number of children: 2   Years of education: 12   Highest education level: High school graduate  Occupational History   Not on file  Tobacco Use   Smoking status: Former    Types: Cigarettes   Smokeless tobacco: Never  Substance and Sexual Activity   Alcohol use: Yes    Alcohol/week: 0.0 standard drinks    Comment: occasional    Drug use: Never   Sexual activity: Not on file  Other Topics Concern   Not on file  Social History Narrative   Lives at home with girlfriend.   Right-handed.   Caffeine use: occasional use   Social Determinants of Corporate investment banker Strain: Not on file  Food Insecurity: Not on file  Transportation Needs: Not on  file  Physical Activity: Not on file  Stress: Not on file  Social Connections: Not on file  Intimate Partner Violence: Not on file    Outpatient Medications Prior to Visit  Medication Sig Dispense Refill   insulin degludec (TRESIBA FLEXTOUCH) 200 UNIT/ML FlexTouch Pen Inject 40 Units into the skin daily. 12 mL 3   Insulin Lispro-aabc (LYUMJEV KWIKPEN) 100 UNIT/ML SOPN INJECT 12 TO 14 UNITS THREE TIMES A DAY BEFORE MEALS 15 mL 0   Insulin Pen Needle 32G X 4 MM MISC Use 4-5x a day 300 each 3   Continuous Blood Gluc Sensor (FREESTYLE LIBRE 14 DAY SENSOR) MISC CHANGE SENSOR EVERY 14 DAYS 2 each 0   DULoxetine (CYMBALTA) 60 MG capsule Take 1 capsule (60 mg total) by mouth daily. (Patient not taking: Reported on 05/19/2021) 30 capsule 11   gabapentin (NEURONTIN) 300 MG capsule Take 3 capsules (900 mg total) by mouth 3 (three) times daily. (Patient not taking: Reported on 05/19/2021) 270 capsule 11   Glucagon (BAQSIMI ONE PACK) 3 MG/DOSE POWD Place 3 mg into the nose once as needed for up to 1 dose. (Patient not taking: Reported on 05/19/2021) 1 each prn   ibuprofen (ADVIL) 600 MG tablet Take 1 tablet (600 mg total) by mouth  every 8 (eight) hours as needed. With food (Patient not taking: Reported on 05/19/2021) 30 tablet 0   VITAMIN D PO Take 1,000 Units by mouth daily.     No facility-administered medications prior to visit.    Allergies  Allergen Reactions   Peanut-Containing Drug Products Other (See Comments)    Throat burning     ROS Review of Systems  Constitutional:  Negative for fever and unexpected weight change.  HENT: Negative.    Eyes:  Negative for photophobia and visual disturbance.  Respiratory: Negative.    Cardiovascular: Negative.   Gastrointestinal: Negative.   Endocrine: Negative for polyphagia and polyuria.  Genitourinary: Negative.   Psychiatric/Behavioral: Negative.       Objective:    Physical Exam Vitals and nursing note reviewed.  Constitutional:      General:  He is not in acute distress.    Appearance: Normal appearance. He is normal weight. He is not ill-appearing or toxic-appearing.  HENT:     Head: Normocephalic and atraumatic.     Right Ear: Tympanic membrane, ear canal and external ear normal.     Left Ear: Tympanic membrane, ear canal and external ear normal.     Mouth/Throat:     Pharynx: Oropharynx is clear. No oropharyngeal exudate or posterior oropharyngeal erythema.  Eyes:     General: No scleral icterus.       Right eye: No discharge.        Left eye: No discharge.     Extraocular Movements: Extraocular movements intact.     Conjunctiva/sclera: Conjunctivae normal.     Pupils: Pupils are equal, round, and reactive to light.  Cardiovascular:     Rate and Rhythm: Normal rate and regular rhythm.  Pulmonary:     Effort: Pulmonary effort is normal.     Breath sounds: Normal breath sounds.  Musculoskeletal:     Cervical back: No rigidity or tenderness.  Lymphadenopathy:     Cervical: No cervical adenopathy.  Neurological:     Mental Status: He is alert and oriented to person, place, and time.  Psychiatric:        Mood and Affect: Mood normal.        Behavior: Behavior normal.   Diabetic Foot Exam - Simple   Simple Foot Form Visual Inspection See comments: Yes Sensation Testing Intact to touch and monofilament testing bilaterally: Yes Pulse Check Posterior Tibialis and Dorsalis pulse intact bilaterally: Yes Comments Feet are pes planus.  Nails of great toes are thick and oncotic     BP 114/76 (BP Location: Right Arm, Patient Position: Sitting, Cuff Size: Normal)   Pulse (!) 108   Temp 98 F (36.7 C) (Temporal)   Ht 6\' 3"  (1.905 m)   Wt 163 lb (73.9 kg)   SpO2 95%   BMI 20.37 kg/m  Wt Readings from Last 3 Encounters:  05/19/21 163 lb (73.9 kg)  12/14/20 172 lb 8 oz (78.2 kg)  12/02/20 172 lb 9.6 oz (78.3 kg)     Health Maintenance Due  Topic Date Due   OPHTHALMOLOGY EXAM  Never done   Hepatitis C Screening   Never done   Pneumococcal Vaccine 65-25 Years old (2 - PCV) 08/30/2018   FOOT EXAM  01/27/2021   URINE MICROALBUMIN  01/27/2021   INFLUENZA VACCINE  05/16/2021    There are no preventive care reminders to display for this patient.  Lab Results  Component Value Date   TSH 1.390 12/14/2020   Lab Results  Component Value Date   WBC 4.0 12/02/2020   HGB 14.2 12/02/2020   HCT 43.8 12/02/2020   MCV 86.3 12/02/2020   PLT 272.0 12/02/2020   Lab Results  Component Value Date   NA 138 12/02/2020   K 4.0 12/02/2020   CO2 31 12/02/2020   GLUCOSE 232 (H) 12/02/2020   BUN 14 12/02/2020   CREATININE 0.85 12/02/2020   BILITOT 1.3 (H) 12/02/2020   ALKPHOS 55 12/02/2020   AST 14 12/02/2020   ALT 14 12/02/2020   PROT 6.8 12/14/2020   ALBUMIN 4.3 12/02/2020   CALCIUM 9.3 12/02/2020   ANIONGAP 10 06/07/2020   GFR 110.66 12/02/2020   Lab Results  Component Value Date   CHOL 118 01/28/2020   Lab Results  Component Value Date   HDL 30.60 (L) 01/28/2020   Lab Results  Component Value Date   LDLCALC 75 01/28/2020   Lab Results  Component Value Date   TRIG 65.0 01/28/2020   Lab Results  Component Value Date   CHOLHDL 4 01/28/2020   Lab Results  Component Value Date   HGBA1C 13.7 (A) 11/24/2020      Assessment & Plan:   Problem List Items Addressed This Visit       Endocrine   Diabetes mellitus type 1.5, managed as type 1 (HCC) - Primary   Relevant Medications   Continuous Blood Gluc Sensor (FREESTYLE LIBRE 14 DAY SENSOR) MISC   Other Relevant Orders   Ambulatory referral to Ophthalmology   Comprehensive metabolic panel   Urinalysis, Routine w reflex microscopic   Microalbumin / creatinine urine ratio   Other Visit Diagnoses     Healthcare maintenance       Relevant Orders   CBC   Comprehensive metabolic panel   Lipid panel   Urinalysis, Routine w reflex microscopic   Microalbumin / creatinine urine ratio       Meds ordered this encounter  Medications    Continuous Blood Gluc Sensor (FREESTYLE LIBRE 14 DAY SENSOR) MISC    Sig: CHANGE SENSOR EVERY 14 DAYS    Dispense:  2 each    Refill:  1    Pt needs appt for further refills    Follow-up: Return in about 6 months (around 11/19/2021), or Continue follow up with Endocrinology..  We had a long discussion about the serious nature of his illness and the importance of compliance with his medical regimen and regular follow-up with endocrinology.  He is aware that he needs to be seen every 3 months.  We discussed the long-term consequences of poorly controlled diabetes to include blindness, kidney failure and loss of lower EXTR.  He agrees to go for an eye check.  He is 6 hours fasting.  Endocrinology visit next month.  Mliss Sax, MD

## 2021-06-21 ENCOUNTER — Other Ambulatory Visit: Payer: Self-pay | Admitting: *Deleted

## 2021-06-21 MED ORDER — GABAPENTIN 300 MG PO CAPS: 600.0000 mg | ORAL_CAPSULE | Freq: Three times a day (TID) | ORAL | 11 refills | Status: DC

## 2021-06-21 MED ORDER — DULOXETINE HCL 60 MG PO CPEP: 60.0000 mg | ORAL_CAPSULE | Freq: Every day | ORAL | 11 refills | Status: DC

## 2021-07-01 ENCOUNTER — Encounter: Payer: Self-pay | Admitting: Internal Medicine

## 2021-07-01 ENCOUNTER — Ambulatory Visit (INDEPENDENT_AMBULATORY_CARE_PROVIDER_SITE_OTHER): Payer: Managed Care, Other (non HMO) | Admitting: Internal Medicine

## 2021-07-01 ENCOUNTER — Other Ambulatory Visit: Payer: Self-pay

## 2021-07-01 VITALS — BP 128/88 | HR 104 | Ht 75.0 in | Wt 173.2 lb

## 2021-07-01 DIAGNOSIS — E785 Hyperlipidemia, unspecified: Secondary | ICD-10-CM | POA: Diagnosis not present

## 2021-07-01 DIAGNOSIS — E1010 Type 1 diabetes mellitus with ketoacidosis without coma: Secondary | ICD-10-CM | POA: Diagnosis not present

## 2021-07-01 LAB — POCT GLYCOSYLATED HEMOGLOBIN (HGB A1C): Hemoglobin A1C: 12.2 % — AB (ref 4.0–5.6)

## 2021-07-01 MED ORDER — TRESIBA FLEXTOUCH 200 UNIT/ML ~~LOC~~ SOPN: 34.0000 [IU] | PEN_INJECTOR | Freq: Every day | SUBCUTANEOUS | 3 refills | Status: DC

## 2021-07-01 NOTE — Patient Instructions (Addendum)
Please decrease: - Tresiba 34 units daily   Increase: - Lyumjev 10-16 units, depending on the size of the meal (in the abdomen, thighs or upper arms)  Use: - Lyumjev sliding scale: - 150-175: + 1 unit  - 176-200: + 2 units  - 201-225: + 3 units  - 226-250: + 4 units  - 251-275: + 5 units - 276-300: + 6 units >300: + 7 units  Please return in 1.5 months.

## 2021-07-01 NOTE — Progress Notes (Signed)
Patient ID: Edward Walls, male   DOB: Apr 16, 1981, 40 y.o.   MRN: [id number]   This visit occurred during the SARS-CoV-2 public health emergency.  Safety protocols were in place, including screening questions prior to the visit, additional usage of staff PPE, and extensive cleaning of exam room while observing appropriate contact time as indicated for disinfecting solutions.   HPI: Edward Walls is a 40 y.o.-year-old male, returning for follow-up for DM1, dx in 2017, insulin-dependent, uncontrolled, with complications (recurrent DKA episodes).  Last visit 7 months ago.  Interim history: No increased urination, blurry vision, nausea, chest pain. His neuropathic symptoms improved on Neurontin, and he tried to come off - stopped abruptly >> developed symptoms again so he is back on Neurontin. He continues to work third shift.  He usually goes to work after 6 PM and comes home before lunch.  Reviewed HbA1c levels: Lab Results  Component Value Date   HGBA1C 13.7 (A) 11/24/2020   HGBA1C >15.0 09/24/2020   HGBA1C >15.5 (H) 06/04/2020   HGBA1C 17.2 (H) 01/28/2020   HGBA1C >15.5 (H) 10/14/2018   HGBA1C 15.9 (H) 03/14/2018   HGBA1C 14.5 (H) 07/04/2017   HGBA1C 12.0 (H) 10/20/2016  07/05/2017: GAD Ab <5  Previously on: - Tresiba U200 30 >> 40 units at 11-12 pm - Humalog 6 >> 9-12 units 3x a day, before meals He had problems affording insulins in the past, but [place] and Humalog are covered.  Then on: - Tresiba 40 units daily >> off >> restarted Tresiba 26 >> 40 units daily in arms -was missing at least 2 doses a week - Humalog  12-14 units before meals >> Lyumjev 12 to 14 >> 15 >> 20 units 3 times a day before meals >> may skip and take 20 units for correction after a meal  At last visit, I advised him to use the following regimen: - Tresiba 40 units daily (in the abdomen) - Lyumjev 12 >> 20-24 units, depending on the size of the meal (in the abdomen, thighs or upper arms) >> actually  using 6-10 units and not always inject it before meals - Lyumjev sliding scale: - 150-175: + 1 unit  - 176-200: + 2 units  - 201-225: + 3 units  - 226-250: + 4 units  - 251-275: + 5 units - 276-300: + 6 units >300: + 7 units  He was previously on the freestyle libre CGM but developed irritation due to adhesive. Currently on freestyle libre 2 with good tolerance.   Previously:   Previously:   Lowest sugar was  53 >> 70s >> 61 (Libre) >> 50s; he has hypoglycemia awareness in the 58s. Highest sugar was 200s >> HI >> HI >> HI. He had 1 previous DKA episode when he could not afford his insulin in the past.  Glucometer: Freestyle Libre >> freestyle libre 2  Pt's meals are: - Breakfast: oatmeal + banana - Lunch: meatloaf, chilli, salad - Dinner: meat + veggies - Snacks: 8 pm  - crackers, at work -he works nights  No CKD, last BUN/creatinine:  Lab Results  Component Value Date   BUN 13 05/19/2021   BUN 14 12/02/2020   CREATININE 0.81 05/19/2021   CREATININE 0.85 12/02/2020   He has dyslipidemia: Lab Results  Component Value Date   CHOL 141 05/19/2021   HDL 29.30 (L) 05/19/2021   LDLCALC 93 05/19/2021   TRIG 94.0 05/19/2021   CHOLHDL 5 05/19/2021   -No previous eye exams. I recommended that  he sees Dr. Randon Goldsmith and gave him his information. He did not see him yet.  -He denies numbness and tingling in his feet.  Pt has FH of DM in M, F, all 3 of his brothers - all have DM2.  ROS: + See HPI  I reviewed pt's medications, allergies, PMH, social hx, family hx, and changes were documented in the history of present illness. Otherwise, unchanged from my initial visit note.  Past Medical History:  Diagnosis Date   Diabetes mellitus without complication (HCC)    Leg pain    Leg weakness    Seasonal allergies    Past Surgical History:  Procedure Laterality Date   DENTAL SURGERY     Social History   Socioeconomic History   Marital status: Single    Spouse name: Not  on file   Number of children:  To: 69 and 17 10/2008   Years of education: Not on file   Highest education level: Not on file  Occupational History    Distribution receiver for [employer]  Social Needs   Financial resource strain: Not on file   Food insecurity:    Worry: Not on file    Inability: Not on file   Transportation needs:    Medical: Not on file    Non-medical: Not on file  Tobacco Use   Smoking status: Former Smoker    Types: Cigarettes   Smokeless tobacco: Never Used  Substance and Sexual Activity   Alcohol use:  no    Alcohol/week:   Types:  Drug use: Not Currently   Sexual activity: Not on file  Lifestyle   Physical activity:    Days per week: Not on file    Minutes per session: Not on file   Stress: Not on file  Relationships   Social connections:    Talks on phone: Not on file    Gets together: Not on file    Attends religious service: Not on file    Active member of club or organization: Not on file    Attends meetings of clubs or organizations: Not on file    Relationship status: Not on file   Intimate partner violence:    Fear of current or ex partner: Not on file    Emotionally abused: Not on file    Physically abused: Not on file    Forced sexual activity: Not on file  Other Topics Concern   Not on file  Social History Narrative   Not on file   Current Outpatient Medications on File Prior to Visit  Medication Sig Dispense Refill   Continuous Blood Gluc Sensor (FREESTYLE LIBRE 14 DAY SENSOR) MISC CHANGE SENSOR EVERY 14 DAYS 2 each 1   DULoxetine (CYMBALTA) 60 MG capsule Take 1 capsule (60 mg total) by mouth daily. 30 capsule 11   gabapentin (NEURONTIN) 300 MG capsule Take 2 capsules (600 mg total) by mouth 3 (three) times daily. 180 capsule 11   insulin degludec (TRESIBA FLEXTOUCH) 200 UNIT/ML FlexTouch Pen Inject 40 Units into the skin daily. 12 mL 3   Insulin Lispro-aabc (LYUMJEV KWIKPEN) 100 UNIT/ML SOPN INJECT 12 TO 14 UNITS THREE TIMES A  DAY BEFORE MEALS 15 mL 0   Insulin Pen Needle 32G X 4 MM MISC Use 4-5x a day 300 each 3   No current facility-administered medications on file prior to visit.   Allergies  Allergen Reactions   Peanut-Containing Drug Products Other (See Comments)    Throat burning  Family History  Problem Relation Age of Onset   Diabetes Mellitus II Mother    Diabetes Mellitus II Father    Diabetes Mellitus II Brother    PE: BP 128/88 (BP Location: Right Arm, Patient Position: Sitting, Cuff Size: Normal)   Pulse (!) 104   Ht 6\' 3"  (1.905 m)   Wt 173 lb 3.2 oz (78.6 kg)   SpO2 97%   BMI 21.65 kg/m  Wt Readings from Last 3 Encounters:  07/01/21 173 lb 3.2 oz (78.6 kg)  05/19/21 163 lb (73.9 kg)  12/14/20 172 lb 8 oz (78.2 kg)        Constitutional: normal weight, in NAD Eyes: PERRLA, EOMI, no exophthalmos ENT: moist mucous membranes, no thyromegaly, no cervical lymphadenopathy Cardiovascular: Tachycardia, RR, No MRG Respiratory: CTA B Gastrointestinal: abdomen soft, NT, ND, BS+ Musculoskeletal: no deformities, strength intact in all 4 Skin: moist, warm, no rashes Neurological: no tremor with outstretched hands, DTR normal in all 4  ASSESSMENT: 1. DM1, insulin-dependent, uncontrolled, without long-term complications, but with DKA episodes  Component     Latest Ref Rng & Units 12/12/2018  Glucose, Plasma     65 - 99 mg/dL 12/14/2018 (H)  C-Peptide     0.80 - 3.85 ng/mL 0.52 (L)  Islet Cell Ab     Neg:<1:1 Negative  ZNT8 Antibodies     U/mL <15   2.  Dyslipidemia  PLAN:  1. Patient with longstanding uncontrolled, insulin-dependent diabetes, diagnosed as type I in 2020, with history of DKA the setting of not affording his insulin.  He is not compliant with office visits and recommended treatment.  At last visit, HbA1c was still very high and I advised him to increase his Lyumjev insulin and discussed about how to inject it correctly.  At that time, he was correcting high blood sugars with  high doses Lyumjev we discussed about how to correctly use the sliding scale.  We also discussed about increasing his Lyumjev insulin with meals.  We did not change his 2021 dose.  I advised him to return in 2 months, but he now returns 7 months after the last visit.  Latest HbA1c was reviewed and this was 13.7% in 11/2020. - in the past I referred him to nutrition/diabetes education but this was not covered by his insurance.  CGM interpretation: -At today's visit, we reviewed his CGM downloads: It appears that 28% of values are in target range (goal >70%), while 71% are higher than 180 (goal <25%), and 1% are lower than 70 (goal <4%).  The calculated average blood sugar is 257.  The projected HbA1c for the next 3 months (GMI) is 9.5%. -Reviewing the CGM trends, it appears that the sugars are closer to the normal range compared to previous visits, but still very high at most times of the day.  There is a clear pattern of increased blood sugar is after his evening meal before he goes to work and upon questioning, he is not usually bolus for this. Moreover, this is usually coffee + doughnut, which I strongly advised him to stop and change to a more healthy meal.  Sugars increase abruptly peaking at approximately 3 AM and remaining very high the entire night.  He does some blood sugar corrections during the night using 10 units of Lyumjev, which is higher than recommended by the given sliding scale.  Also, for his 2 AM meal, he only boluses 6 units, which is inadequate.  When he gets home, since sugars are  very high, he does take insulin and then may have low blood sugars after he goes to sleep during the day. -Therefore, for now, to reduce the chance of hypoglycemia, I advised him to reduce the Tresiba dose.  However, we discussed about the importance of taking a higher dose of Lyumjev and this needs to be taken at the start of the meal, and not after, to avoid widely fluctuating sugars after meals.  He  agrees to try these measures. - I suggested to:  Patient Instructions  Please decrease: - Tresiba 34 units daily   Increase: - Lyumjev 10-16 units, depending on the size of the meal (in the abdomen, thighs or upper arms)  Use: - Lyumjev sliding scale: - 150-175: + 1 unit  - 176-200: + 2 units  - 201-225: + 3 units  - 226-250: + 4 units  - 251-275: + 5 units - 276-300: + 6 units >300: + 7 units  Please return in 1.5 months.  - we checked his HbA1c: 12.2% (lower, but still very high) - advised to check sugars at different times of the day - 4x a day, rotating check times - advised for yearly eye exams >> he is not UTD.  I again advised him to schedule this. - return to clinic in 1.5 months  2.  Dyslipidemia -Reviewed latest lipid panel from 05/2021: LDL at goal, HDL low: Lab Results  Component Value Date   CHOL 141 05/19/2021   HDL 29.30 (L) 05/19/2021   LDLCALC 93 05/19/2021   TRIG 94.0 05/19/2021   CHOLHDL 5 05/19/2021  -He is not on a statin  Carlus Pavlov, MD PhD [facility] Endocrinology

## 2021-07-22 ENCOUNTER — Other Ambulatory Visit: Payer: Self-pay | Admitting: Internal Medicine

## 2021-07-22 ENCOUNTER — Encounter: Payer: Self-pay | Admitting: Internal Medicine

## 2021-07-22 ENCOUNTER — Other Ambulatory Visit: Payer: Self-pay | Admitting: Family Medicine

## 2021-07-22 DIAGNOSIS — E139 Other specified diabetes mellitus without complications: Secondary | ICD-10-CM

## 2021-08-18 ENCOUNTER — Encounter: Payer: Self-pay | Admitting: Internal Medicine

## 2021-08-18 ENCOUNTER — Other Ambulatory Visit: Payer: Self-pay

## 2021-08-18 ENCOUNTER — Ambulatory Visit (INDEPENDENT_AMBULATORY_CARE_PROVIDER_SITE_OTHER): Payer: Managed Care, Other (non HMO) | Admitting: Internal Medicine

## 2021-08-18 VITALS — BP 120/72 | HR 93 | Ht 75.0 in | Wt 172.0 lb

## 2021-08-18 DIAGNOSIS — E1010 Type 1 diabetes mellitus with ketoacidosis without coma: Secondary | ICD-10-CM | POA: Diagnosis not present

## 2021-08-18 DIAGNOSIS — E785 Hyperlipidemia, unspecified: Secondary | ICD-10-CM

## 2021-08-18 NOTE — Progress Notes (Signed)
Patient ID: Edward Walls, male   DOB: 07-08-1981, 40 y.o.   MRN: [id number]   This visit occurred during the SARS-CoV-2 public health emergency.  Safety protocols were in place, including screening questions prior to the visit, additional usage of staff PPE, and extensive cleaning of exam room while observing appropriate contact time as indicated for disinfecting solutions.   HPI: Edward Walls is a 40 y.o.-year-old male, returning for follow-up for DM1, dx in 2017, insulin-dependent, uncontrolled, with complications (recurrent DKA episodes).  Last visit 1.5 months ago.  Interim history: No increased urination, blurry vision, nausea, chest pain. He has neuropathic symptoms - now back on Neurontin (started before last visit). Also got a neuropathy cream online that helps - Raitera Max Strength neuropathy.  He continues to work third shift-usually goes to work after 6 PM and comes home before lunch.  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  He tells me that he was not able to eat meals at regular times and he is missing many insulin doses.  Reviewed HbA1c levels: Lab Results  Component Value Date   HGBA1C 12.2 (A) 07/01/2021   HGBA1C 13.7 (A) 11/24/2020   HGBA1C >15.0 09/24/2020   HGBA1C >15.5 (H) 06/04/2020   HGBA1C 17.2 (H) 01/28/2020   HGBA1C >15.5 (H) 10/14/2018   HGBA1C 15.9 (H) 03/14/2018   HGBA1C 14.5 (H) 07/04/2017   HGBA1C 12.0 (H) 10/20/2016  07/05/2017: GAD Ab <5  Previously on: - Tresiba U200 30 >> 40 units at 11-12 pm - Humalog 6 >> 9-12 units 3x a day, before meals He had problems affording insulins in the past, but [place] and Humalog are covered.  Then on: - Tresiba 40 units daily >> off >> restarted Tresiba 26 >> 40 units daily in arms -was missing at least 2 doses a week - Humalog  12-14 units before meals >> Lyumjev 12 to 14 >> 15 >> 20 units 3 times a day before meals >> may skip and take 20 units for  correction after a meal  At last visit, I advised him to use the following regimen: However at this visit, he is tolerating that he misses MANY doses: - Tresiba 40 units daily (in the abdomen) >> 34 units daily - Lyumjev 12 >> 20-24 unitsl (in the abdomen, thighs or upper arms) >> 6-10 units >> 10-16 units, depending on the size of the meal (in the abdomen, thighs or upper arms)  He was previously on the freestyle libre CGM but developed irritation due to adhesive. Currently on freestyle libre 2 - no pbs with this:   Previously:   Previously:   Lowest sugar was  50s >> 54; he has hypoglycemia awareness in the 80s. Highest sugar was HI >> HI. He had 1 previous DKA episode when he could not afford his insulin in the past.  Glucometer: Freestyle Libre >> freestyle libre 2  Pt's meals are: - Breakfast: oatmeal + banana - Lunch: meatloaf, chilli, salad - Dinner: meat + veggies - Snacks: 8 pm  - crackers, at work -he works nights  No CKD, last BUN/creatinine:  Lab Results  Component Value Date   BUN 13 05/19/2021   BUN 14 12/02/2020   CREATININE 0.81 05/19/2021   CREATININE 0.85 12/02/2020   He has dyslipidemia: Lab Results  Component Value Date   CHOL 141 05/19/2021   HDL 29.30 (L) 05/19/2021   LDLCALC 93 05/19/2021   TRIG 94.0 05/19/2021  CHOLHDL 5 05/19/2021   -No previous eye exams. I recommended that he sees Dr. Randon Goldsmith and gave him his information. He did not see him yet.  -He denies numbness and tingling in his feet.  Pt has FH of DM in M, F, all 3 of his brothers - all have DM2.  ROS: + See HPI  I reviewed pt's medications, allergies, PMH, social hx, family hx, and changes were documented in the history of present illness. Otherwise, unchanged from my initial visit note.  Past Medical History:  Diagnosis Date   Diabetes mellitus without complication (HCC)    Leg pain    Leg weakness    Seasonal allergies    Past Surgical History:  Procedure Laterality  Date   DENTAL SURGERY     Social History   Socioeconomic History   Marital status: Single    Spouse name: Not on file   Number of children:  To: 45 and 17 10/2008   Years of education: Not on file   Highest education level: Not on file  Occupational History    Distribution receiver for [employer]  Social Needs   Financial resource strain: Not on file   Food insecurity:    Worry: Not on file    Inability: Not on file   Transportation needs:    Medical: Not on file    Non-medical: Not on file  Tobacco Use   Smoking status: Former Smoker    Types: Cigarettes   Smokeless tobacco: Never Used  Substance and Sexual Activity   Alcohol use:  no    Alcohol/week:   Types:  Drug use: Not Currently   Sexual activity: Not on file  Lifestyle   Physical activity:    Days per week: Not on file    Minutes per session: Not on file   Stress: Not on file  Relationships   Social connections:    Talks on phone: Not on file    Gets together: Not on file    Attends religious service: Not on file    Active member of club or organization: Not on file    Attends meetings of clubs or organizations: Not on file    Relationship status: Not on file   Intimate partner violence:    Fear of current or ex partner: Not on file    Emotionally abused: Not on file    Physically abused: Not on file    Forced sexual activity: Not on file  Other Topics Concern   Not on file  Social History Narrative   Not on file   Current Outpatient Medications on File Prior to Visit  Medication Sig Dispense Refill   Continuous Blood Gluc Sensor (FREESTYLE LIBRE 2 SENSOR) MISC CHANGE SENSOR EVERY 14 DAYS 6 each 3   DULoxetine (CYMBALTA) 60 MG capsule Take 1 capsule (60 mg total) by mouth daily. 30 capsule 11   gabapentin (NEURONTIN) 300 MG capsule Take 2 capsules (600 mg total) by mouth 3 (three) times daily. 180 capsule 11   insulin degludec (TRESIBA FLEXTOUCH) 200 UNIT/ML FlexTouch Pen Inject 34 Units into the  skin daily. 12 mL 3   Insulin Lispro-aabc (LYUMJEV KWIKPEN) 100 UNIT/ML KwikPen INJECT 12 TO 14 UNITS UNDER THE SKIN THREE TIMES A DAY BEFORE MEALS **PLEASE SCHEDULE APPOINTMENT FOR ADDITIONAL REFILLS** 15 mL 0   Insulin Pen Needle 32G X 4 MM MISC Use 4-5x a day 300 each 3   No current facility-administered medications on file prior to visit.  Allergies  Allergen Reactions   Peanut-Containing Drug Products Other (See Comments)    Throat burning    Family History  Problem Relation Age of Onset   Diabetes Mellitus II Mother    Diabetes Mellitus II Father    Diabetes Mellitus II Brother    PE: There were no vitals taken for this visit. Wt Readings from Last 3 Encounters:  07/01/21 173 lb 3.2 oz (78.6 kg)  05/19/21 163 lb (73.9 kg)  12/14/20 172 lb 8 oz (78.2 kg)        Constitutional: normal weight, in NAD Eyes: PERRLA, EOMI, no exophthalmos ENT: moist mucous membranes, no thyromegaly, no cervical lymphadenopathy Cardiovascular: Tachycardia, RR, No MRG Respiratory: CTA B Gastrointestinal: abdomen soft, NT, ND, BS+ Musculoskeletal: no deformities, strength intact in all 4 Skin: moist, warm, no rashes Neurological: no tremor with outstretched hands, DTR normal in all 4  ASSESSMENT: 1. DM1, insulin-dependent, uncontrolled, without long-term complications, but with DKA episodes  Component     Latest Ref Rng & Units 12/12/2018  Glucose, Plasma     65 - 99 mg/dL 314 (H)  C-Peptide     0.80 - 3.85 ng/mL 0.52 (L)  Islet Cell Ab     Neg:<1:1 Negative  ZNT8 Antibodies     U/mL <15   2.  Dyslipidemia  PLAN:  1. Patient with longstanding, uncontrolled, insulin-dependent diabetes, diagnosed as type I in 2020, with history of DKA, in the setting of not affording his insulin.  He is not compliant with office visits and recommended treatment.  HbA1c at last visit was 12.2%, decreased from 13.7% previously however, per his CGM, the projected HbA1c at that time was actually lower, at  9.5%. -At last visit, it appears that sugars were closer to the normal range compared to the previous visits but still very high.  There was a clear pattern of increased blood sugars after his evening meal before he was going to work and, upon questioning, he was not bolusing for this.  This is usually a high carb meal, coffee + doughnut, which I strongly advised him to stop and change to a healthier meal.  However, I did advise him that it would be extremely important to bolus for this meal, also, to avoid picking them blood sugars around 3 AM and continuous hyperglycemia afterwards.  He did some blood sugar corrections during the night using 10 units of Lyumjev but this was higher than recommended by the sliding scale.  Also, he was covering his 2 AM meal with only 6 units of insulin which was not adequate.  We discussed about correcting blood sugars based on the sliding scale and also guiding the dose of Lyumjev on the size of his meals.  We did back off the [place] dose to avoid hypocalcemia but I strongly advised him to take Lyumjev before every meal.  We discussed about the importance of not waiting until he started eating to bolus. -In the past, I did refer him to nutrition/diabetes education but this was not covered by his insurance CGM interpretation: -At today's visit, we reviewed his CGM downloads: It appears that 9% of values are in target range (goal >70%), while 90% are higher than 180 (goal <25%), and 1% are lower than 70 (goal <4%).  The calculated average blood sugar is 328.  The projected HbA1c for the next 3 months (GMI) is 11.2%. -Reviewing the CGM trends, sugars are terribly uncontrolled, with very few values in the normal range.  In fact, the  sugars are so high, I cannot even see the trends on the CGM downloads.  It does appear though that sugars are dropping too close to the normal range around 6 AM and then 8 PM, when he correct high blood sugars.  Upon questioning, he tells me that he  is not able to inject insulin consistently and he misses many doses.  Due to this, I am not able to adjust his regimen.  We discussed about the importance of taking the doses consistently, but at this visit we also discussed about starting on an insulin pump.  At this point, I feel that this is mandatory for him. -We made a plan of action so that he can get the pump as soon as possible.  I gave him brochures for the t:slim and OmniPod pumps and I asked him to check with his insurance to see which ones are covered.  Also, to ask whether the Dexcom CGM is covered.  As soon as possible, I advised him to get appointment with the diabetes educator and the nutritionist to learn carb counting and also to see the 2 pumps.  At this visit I gave him written instructions about carb counting so that he can start reading about it to prepare for that appointment with the nutritionist.  Afterwards, I am hoping that we can proceed to her starting the pump.  For now, I advised him to continue the current regimen but definitely to try to take the doses as advised. - I suggested to:  Patient Instructions  Please schedule an appt with Cristy Folks for diabetes education.  Please schedule an appt with Oran Rein with nutrition.  Call your insurance to find out what pumps (Omnipod Dash or Omnipod 5/ t:slim X2) and Continuous Glucose Monitors (Dexcom) are covered.  Please continue: - Tresiba 34 units daily  - Lyumjev 10-16 units, depending on the size of the meal (in the abdomen, thighs or upper arms) - Lyumjev sliding scale: - 150-175: + 1 unit  - 176-200: + 2 units  - 201-225: + 3 units  - 226-250: + 4 units  - 251-275: + 5 units - 276-300: + 6 units >300: + 7 units  Please return in 1.5 months.   - advised to check sugars at different times of the day - 4x a day, rotating check times - advised for yearly eye exams >> he is not UTD - return to clinic in 1.5 months  2.  Dyslipidemia -Reviewed latest lipid  panel from 05/2021: LDL at goal, HDL low, triglycerides at goal: Lab Results  Component Value Date   CHOL 141 05/19/2021   HDL 29.30 (L) 05/19/2021   LDLCALC 93 05/19/2021   TRIG 94.0 05/19/2021   CHOLHDL 5 05/19/2021  -He is not on a statin  Carlus Pavlov, MD PhD [facility]

## 2021-08-18 NOTE — Patient Instructions (Addendum)
Please schedule an appt with Cristy Folks for diabetes education.  Please schedule an appt with Oran Rein with nutrition.  Call your insurance to find out what pumps (Omnipod Dash or Omnipod 5/ t:slim X2) and Continuous Glucose Monitors (Dexcom) are covered.  Please continue: - Tresiba 34 units daily  - Lyumjev 10-16 units, depending on the size of the meal (in the abdomen, thighs or upper arms) - Lyumjev sliding scale: - 150-175: + 1 unit  - 176-200: + 2 units  - 201-225: + 3 units  - 226-250: + 4 units  - 251-275: + 5 units - 276-300: + 6 units >300: + 7 units  Please return in 1.5 months.  Carbohydrate Counting for Diabetes Mellitus, Adult Carbohydrate counting is a method of keeping track of how many carbohydrates you eat. Eating carbohydrates naturally increases the amount of sugar (glucose) in the blood. Counting how many carbohydrates you eat improves your blood glucose control, which helps you manage your diabetes. It is important to know how many carbohydrates you can safely have in each meal. This is different for every person. A dietitian can help you make a meal plan and calculate how many carbohydrates you should have at each meal and snack. What foods contain carbohydrates? Carbohydrates are found in the following foods: Grains, such as breads and cereals. Dried beans and soy products. Starchy vegetables, such as potatoes, peas, and corn. Fruit and fruit juices. Milk and yogurt. Sweets and snack foods, such as cake, cookies, candy, chips, and soft drinks. How do I count carbohydrates in foods? There are two ways to count carbohydrates in food. You can read food labels or learn standard serving sizes of foods. You can use either of the methods or a combination of both. Using the Nutrition Facts label The Nutrition Facts list is included on the labels of almost all packaged foods and beverages in the [place] It includes: The serving size. Information about nutrients in  each serving, including the grams (g) of carbohydrate per serving. To use the Nutrition Facts: Decide how many servings you will have. Multiply the number of servings by the number of carbohydrates per serving. The resulting number is the total amount of carbohydrates that you will be having. Learning the standard serving sizes of foods When you eat carbohydrate foods that are not packaged or do not include Nutrition Facts on the label, you need to measure the servings in order to count the amount of carbohydrates. Measure the foods that you will eat with a food scale or measuring cup, if needed. Decide how many standard-size servings you will eat. Multiply the number of servings by 15. For foods that contain carbohydrates, one serving equals 15 g of carbohydrates. For example, if you eat 2 cups or 10 oz (300 g) of strawberries, you will have eaten 2 servings and 30 g of carbohydrates (2 servings x 15 g = 30 g). For foods that have more than one food mixed, such as soups and casseroles, you must count the carbohydrates in each food that is included. The following list contains standard serving sizes of common carbohydrate-rich foods. Each of these servings has about 15 g of carbohydrates: 1 slice of bread. 1 six-inch (15 cm) tortilla. ? cup or 2 oz (53 g) cooked rice or pasta.  cup or 3 oz (85 g) cooked or canned, drained and rinsed beans or lentils.  cup or 3 oz (85 g) starchy vegetable, such as peas, corn, or squash.  cup or 4 oz (  120 g) hot cereal.  cup or 3 oz (85 g) boiled or mashed potatoes, or  or 3 oz (85 g) of a large baked potato.  cup or 4 fl oz (118 mL) fruit juice. 1 cup or 8 fl oz (237 mL) milk. 1 small or 4 oz (106 g) apple.  or 2 oz (63 g) of a medium banana. 1 cup or 5 oz (150 g) strawberries. 3 cups or 1 oz (24 g) popped popcorn. What is an example of carbohydrate counting? To calculate the number of carbohydrates in this sample meal, follow the steps shown  below. Sample meal 3 oz (85 g) chicken breast. ? cup or 4 oz (106 g) brown rice.  cup or 3 oz (85 g) corn. 1 cup or 8 fl oz (237 mL) milk. 1 cup or 5 oz (150 g) strawberries with sugar-free whipped topping. Carbohydrate calculation Identify the foods that contain carbohydrates: Rice. Corn. Milk. Strawberries. Calculate how many servings you have of each food: 2 servings rice. 1 serving corn. 1 serving milk. 1 serving strawberries. Multiply each number of servings by 15 g: 2 servings rice x 15 g = 30 g. 1 serving corn x 15 g = 15 g. 1 serving milk x 15 g = 15 g. 1 serving strawberries x 15 g = 15 g. Add together all of the amounts to find the total grams of carbohydrates eaten: 30 g + 15 g + 15 g + 15 g = 75 g of carbohydrates total. What are tips for following this plan? Shopping Develop a meal plan and then make a shopping list. Buy fresh and frozen vegetables, fresh and frozen fruit, dairy, eggs, beans, lentils, and whole grains. Look at food labels. Choose foods that have more fiber and less sugar. Avoid processed foods and foods with added sugars. Meal planning Aim to have the same amount of carbohydrates at each meal and for each snack time. Plan to have regular, balanced meals and snacks. Where to find more information American Diabetes Association: www.diabetes.org Centers for Disease Control and Prevention: [employer] Summary Carbohydrate counting is a method of keeping track of how many carbohydrates you eat. Eating carbohydrates naturally increases the amount of sugar (glucose) in the blood. Counting how many carbohydrates you eat improves your blood glucose control, which helps you manage your diabetes. A dietitian can help you make a meal plan and calculate how many carbohydrates you should have at each meal and snack. This information is not intended to replace advice given to you by your health care provider. Make sure you discuss any questions you have with  your health care provider. Document Revised: 10/02/2019 Document Reviewed: 10/03/2019 Elsevier Patient Education  2021 [employer].

## 2021-09-03 ENCOUNTER — Encounter: Payer: Self-pay | Admitting: Internal Medicine

## 2021-09-11 ENCOUNTER — Other Ambulatory Visit: Payer: Self-pay | Admitting: Internal Medicine

## 2021-09-29 ENCOUNTER — Other Ambulatory Visit: Payer: Self-pay

## 2021-09-29 ENCOUNTER — Encounter: Payer: Self-pay | Admitting: Internal Medicine

## 2021-09-29 ENCOUNTER — Ambulatory Visit: Payer: Managed Care, Other (non HMO) | Admitting: Internal Medicine

## 2021-09-29 VITALS — BP 110/62 | HR 90 | Ht 75.0 in | Wt 176.4 lb

## 2021-09-29 DIAGNOSIS — E785 Hyperlipidemia, unspecified: Secondary | ICD-10-CM | POA: Diagnosis not present

## 2021-09-29 DIAGNOSIS — E1010 Type 1 diabetes mellitus with ketoacidosis without coma: Secondary | ICD-10-CM

## 2021-09-29 LAB — POCT GLYCOSYLATED HEMOGLOBIN (HGB A1C): HbA1c POC (<> result, manual entry): >15 % (ref 4.0–5.6)

## 2021-09-29 MED ORDER — DEXCOM G6 TRANSMITTER MISC: 1.0000 | 3 refills | Status: DC

## 2021-09-29 MED ORDER — OMNIPOD 5 DEXG7G6 INTRO GEN 5 KIT: 1.0000 | PACK | 0 refills | Status: DC | PRN

## 2021-09-29 MED ORDER — DEXCOM G6 SENSOR MISC: 1.0000 | 3 refills | Status: AC

## 2021-09-29 MED ORDER — DEXCOM G6 RECEIVER DEVI
1.0000 | Freq: Once | 0 refills | Status: AC
Start: 2021-09-29 — End: 2021-09-29

## 2021-09-29 MED ORDER — OMNIPOD 5 DEXG7G6 PODS GEN 5 MISC: 1.0000 | 3 refills | Status: DC

## 2021-09-29 NOTE — Progress Notes (Signed)
Patient ID: Edward Walls, male   DOB: 07-13-1981, 40 y.o.   MRN: [id number]   This visit occurred during the SARS-CoV-2 public health emergency.  Safety protocols were in place, including screening questions prior to the visit, additional usage of staff PPE, and extensive cleaning of exam room while observing appropriate contact time as indicated for disinfecting solutions.   HPI: Edward Walls is a 40 y.o.-year-old male, returning for follow-up for DM1, dx in 2017, insulin-dependent, uncontrolled, with complications (recurrent DKA episodes).  Last visit 1.5 months ago.  Interim history: No increased urination, blurry vision, nausea, chest pain.  However, few days ago he started to have increased urination and feeling poorly.  At that time, he started to reduce carbs, drink more water and take insulin more consistently.  Sugars improved slightly. He has neuropathic symptoms and he is back on Neurontin, starting before last visit. Also got a neuropathy cream online that helps - Raitera Max Strength neuropathy.  He is still not able to eat regular meals at work as he is very busy-works shift (goes to work at 6 PM). He continues to miss insulin doses. He checked with his insurance and OmniPod pump is covered.  He needs to go through the diabetes educator and nutritionist recommended by insurance, was not able to see our own.  Reviewed HbA1c levels: Lab Results  Component Value Date   HGBA1C 12.2 (A) 07/01/2021   HGBA1C 13.7 (A) 11/24/2020   HGBA1C >15.0 09/24/2020   HGBA1C >15.5 (H) 06/04/2020   HGBA1C 17.2 (H) 01/28/2020   HGBA1C >15.5 (H) 10/14/2018   HGBA1C 15.9 (H) 03/14/2018   HGBA1C 14.5 (H) 07/04/2017   HGBA1C 12.0 (H) 10/20/2016  07/05/2017: GAD Ab <5  Currently on: However at this visit, he continues to miss doses: - Tresiba 40 units daily (in the abdomen) >> 34 units daily - Lyumjev 6-10 units >> 10-16 units, depending on the size of the meal (in the abdomen, thighs or  upper arms)  He was previously on the freestyle libre CGM but developed irritation due to adhesive. Currently on freestyle libre 2 - no pbs with this:   Previously:   Previously:   Lowest sugar was  50s >> 54 >> 91; he has hypoglycemia awareness in the 80s. Highest sugar was HI >> HI >> HI. He had 1 previous DKA episode when he could not afford his insulin in the past.  Glucometer: Freestyle Libre >> freestyle libre 2  Pt's meals are: - Breakfast: oatmeal + banana - Lunch: meatloaf, chilli, salad - Dinner: meat + veggies - Snacks: 8 pm  - crackers, at work -he works nights  No CKD, last BUN/creatinine:  Lab Results  Component Value Date   BUN 13 05/19/2021   BUN 14 12/02/2020   CREATININE 0.81 05/19/2021   CREATININE 0.85 12/02/2020   He has dyslipidemia: Lab Results  Component Value Date   CHOL 141 05/19/2021   HDL 29.30 (L) 05/19/2021   LDLCALC 93 05/19/2021   TRIG 94.0 05/19/2021   CHOLHDL 5 05/19/2021   -No previous eye exams. I recommended that he sees Dr. Randon Goldsmith and gave him his information. He did not see him yet.  He tells me that his insurance covers another provider and he will try to establish care with him.  -He denies numbness and tingling in his feet.  Pt has FH of DM in M, F, all 3 of his brothers - all have DM2.  ROS: + See HPI  I reviewed  pt's medications, allergies, PMH, social hx, family hx, and changes were documented in the history of present illness. Otherwise, unchanged from my initial visit note.  Past Medical History:  Diagnosis Date   Diabetes mellitus without complication (HCC)    Leg pain    Leg weakness    Seasonal allergies    Past Surgical History:  Procedure Laterality Date   DENTAL SURGERY     Social History   Socioeconomic History   Marital status: Single    Spouse name: Not on file   Number of children:  To: 23 and 17 10/2008   Years of education: Not on file   Highest education level: Not on file  Occupational  History    Distribution receiver for [employer]  Social Needs   Financial resource strain: Not on file   Food insecurity:    Worry: Not on file    Inability: Not on file   Transportation needs:    Medical: Not on file    Non-medical: Not on file  Tobacco Use   Smoking status: Former Smoker    Types: Cigarettes   Smokeless tobacco: Never Used  Substance and Sexual Activity   Alcohol use:  no    Alcohol/week:   Types:  Drug use: Not Currently   Sexual activity: Not on file  Lifestyle   Physical activity:    Days per week: Not on file    Minutes per session: Not on file   Stress: Not on file  Relationships   Social connections:    Talks on phone: Not on file    Gets together: Not on file    Attends religious service: Not on file    Active member of club or organization: Not on file    Attends meetings of clubs or organizations: Not on file    Relationship status: Not on file   Intimate partner violence:    Fear of current or ex partner: Not on file    Emotionally abused: Not on file    Physically abused: Not on file    Forced sexual activity: Not on file  Other Topics Concern   Not on file  Social History Narrative   Not on file   Current Outpatient Medications on File Prior to Visit  Medication Sig Dispense Refill   Continuous Blood Gluc Sensor (FREESTYLE LIBRE 2 SENSOR) MISC CHANGE SENSOR EVERY 14 DAYS 6 each 3   DULoxetine (CYMBALTA) 60 MG capsule Take 1 capsule (60 mg total) by mouth daily. 30 capsule 11   gabapentin (NEURONTIN) 300 MG capsule Take 2 capsules (600 mg total) by mouth 3 (three) times daily. 180 capsule 11   insulin degludec (TRESIBA FLEXTOUCH) 200 UNIT/ML FlexTouch Pen Inject 34 Units into the skin daily. 12 mL 3   Insulin Pen Needle 32G X 4 MM MISC Use 4-5x a day 300 each 3   LYUMJEV KWIKPEN 100 UNIT/ML KwikPen INJECT 12 TO 14 UNITS UNDER THE SKIN THREE TIMES A DAY BEFORE MEALS **PLEASE SCHEDULE APPOINTMENT FOR ADDITIONAL REFILLS** 15 mL 0   No  current facility-administered medications on file prior to visit.   Allergies  Allergen Reactions   Peanut-Containing Drug Products Other (See Comments)    Throat burning    Family History  Problem Relation Age of Onset   Diabetes Mellitus II Mother    Diabetes Mellitus II Father    Diabetes Mellitus II Brother    PE: BP 110/62 (BP Location: Right Arm, Patient Position: Sitting, Cuff Size:  Normal)    Pulse 90    Ht  (1.905 m)    Wt 176 lb 6.4 oz (80 kg)    SpO2 97%    BMI 22.05 kg/m  Wt Readings from Last 3 Encounters:  09/29/21 176 lb 6.4 oz (80 kg)  08/18/21 172 lb (78 kg)  07/01/21 173 lb 3.2 oz (78.6 kg)        Constitutional: normal weight, in NAD Eyes: PERRLA, EOMI, no exophthalmos ENT: moist mucous membranes, no thyromegaly, no cervical lymphadenopathy Cardiovascular: Tachycardia, RR, No MRG Respiratory: CTA B Musculoskeletal: no deformities, strength intact in all 4 Skin: moist, warm, no rashes Neurological: no tremor with outstretched hands, DTR normal in all 4  ASSESSMENT: 1. DM1, insulin-dependent, uncontrolled, without long-term complications, but with DKA episodes  Component     Latest Ref Rng & Units 12/12/2018  Glucose, Plasma     65 - 99 mg/dL 161 (H)  C-Peptide     0.80 - 3.85 ng/mL 0.52 (L)  Islet Cell Ab     Neg:<1:1 Negative  ZNT8 Antibodies     U/mL <15   2.  Dyslipidemia  PLAN:  1. Patient with longstanding, uncontrolled, insulin-dependent diabetes, diagnosed as type I in 2020, with history of DKA, in the setting of not affording his insulin.  Afterwards, he was able to afford insulin, but he is not taking it consistently.  In the last few months, sugars have been very high, as he was very busy at work in preparation for the holidays.   -At last visit, we did not change his regimen but we did discuss about starting on insulin pump.  I strongly advised him to take insulin as prescribed. CGM interpretation: -At today's visit, we reviewed his  CGM downloads: It appears that 7% of values are in target range (goal >70%), while 93% are higher than 180 (goal <25%), and 0% are lower than 70 (goal <4%).  The calculated average blood sugar is 319.  The projected HbA1c for the next 3 months (GMI) is 10.9%. -Reviewing the CGM trends, his blood sugars remain very uncontrolled, with slight improvement in the last few days, after he started to feel very poorly and was afraid that he may get into DKA.  We reviewed individual day patterns together.  Whenever he has an increase in blood sugars, he admits that he did not use Lyumjev before the meal.  I strongly advised him to continue with the same doses I gave him and take it consistently, before every meal, but I do not feel that we need to change his regimen for now.  Compliance is key. -At this visit, we again discussed about switching to an insulin pump hopefully connected to a CGM in the closed-loop mode.  I sent a prescription for OmniPod 5 pump and Dexcom sensor to his pharmacy.  I advised him to let me know right away if they need to go through a supplier.  In that case, I need to give him a list of suppliers.  I do not think we can afford to wait until next visit to start this.  I am really hoping that he can get an appointment with diabetes educator and nutritionist as soon as possible.  It looks like the nutritionist and diabetes educator that I referred him to are not covered but I advised him to try to get an appointment with whomever is covered by his insurance.  This is quite a time sensitive. - I suggested to:  Patient Instructions  Please schedule an appt with diabetes education and nutrition.  Get the Omnipod 5 and Dexcom from the pharmacy.  Please continue: - Tresiba 34 units daily  - Lyumjev 10-16 units, depending on the size of the meal (in the abdomen, thighs or upper arms) - Lyumjev sliding scale: - 150-175: + 1 unit  - 176-200: + 2 units  - 201-225: + 3 units  - 226-250: + 4 units   - 251-275: + 5 units - 276-300: + 6 units >300: + 7 units  Schedule a new eye exam.  Please return in 1.5 months.   - we checked his HbA1c: >15% (higher) - advised to check sugars at different times of the day - 4x a day, rotating check times - advised for yearly eye exams >> he is not UTD -again advised to schedule - return to clinic in 1.5 months   2.  Dyslipidemia -Reviewed latest lipid panel from 05/2021: LDL at goal, HDL low, triglycerides at goal: Lab Results  Component Value Date   CHOL 141 05/19/2021   HDL 29.30 (L) 05/19/2021   LDLCALC 93 05/19/2021   TRIG 94.0 05/19/2021   CHOLHDL 5 05/19/2021  -He is not on a statin  Carlus Pavlov, MD PhD [facility]

## 2021-09-29 NOTE — Patient Instructions (Addendum)
Please schedule an appt with diabetes education and nutrition.  Get the Omnipod 5 and Dexcom from the pharmacy.  Please continue: - Tresiba 34 units daily  - Lyumjev 10-16 units, depending on the size of the meal (in the abdomen, thighs or upper arms) - Lyumjev sliding scale: - 150-175: + 1 unit  - 176-200: + 2 units  - 201-225: + 3 units  - 226-250: + 4 units  - 251-275: + 5 units - 276-300: + 6 units >300: + 7 units  Schedule a new eye exam.  Please return in 1.5 months.

## 2021-11-10 ENCOUNTER — Ambulatory Visit: Payer: Managed Care, Other (non HMO) | Admitting: Internal Medicine

## 2021-11-18 ENCOUNTER — Other Ambulatory Visit: Payer: Self-pay | Admitting: Internal Medicine

## 2021-11-22 ENCOUNTER — Ambulatory Visit
Admission: RE | Admit: 2021-11-22 | Discharge: 2021-11-22 | Disposition: A | Discharge status: Home / Self Care | Payer: Managed Care, Other (non HMO) | Source: Ambulatory Visit | Attending: Family Medicine | Admitting: Family Medicine

## 2021-11-22 ENCOUNTER — Emergency Department (HOSPITAL_BASED_OUTPATIENT_CLINIC_OR_DEPARTMENT_OTHER)
Admission: EM | Admit: 2021-11-22 | Discharge: 2021-11-22 | Disposition: A | Discharge status: Home / Self Care | Payer: Managed Care, Other (non HMO) | Attending: Emergency Medicine | Admitting: Emergency Medicine

## 2021-11-22 ENCOUNTER — Other Ambulatory Visit: Payer: Self-pay

## 2021-11-22 ENCOUNTER — Encounter (HOSPITAL_BASED_OUTPATIENT_CLINIC_OR_DEPARTMENT_OTHER): Payer: Self-pay | Admitting: *Deleted

## 2021-11-22 ENCOUNTER — Emergency Department (HOSPITAL_BASED_OUTPATIENT_CLINIC_OR_DEPARTMENT_OTHER): Payer: Managed Care, Other (non HMO)

## 2021-11-22 VITALS — BP 142/75 | HR 111 | Temp 98.9°F | Resp 18

## 2021-11-22 DIAGNOSIS — Z794 Long term (current) use of insulin: Secondary | ICD-10-CM | POA: Diagnosis not present

## 2021-11-22 DIAGNOSIS — E860 Dehydration: Secondary | ICD-10-CM | POA: Diagnosis not present

## 2021-11-22 DIAGNOSIS — R000 Tachycardia, unspecified: Secondary | ICD-10-CM | POA: Insufficient documentation

## 2021-11-22 DIAGNOSIS — R066 Hiccough: Secondary | ICD-10-CM | POA: Diagnosis present

## 2021-11-22 DIAGNOSIS — Z91010 Allergy to peanuts: Secondary | ICD-10-CM | POA: Insufficient documentation

## 2021-11-22 DIAGNOSIS — Z79899 Other long term (current) drug therapy: Secondary | ICD-10-CM | POA: Insufficient documentation

## 2021-11-22 DIAGNOSIS — R9431 Abnormal electrocardiogram [ECG] [EKG]: Secondary | ICD-10-CM

## 2021-11-22 DIAGNOSIS — E1165 Type 2 diabetes mellitus with hyperglycemia: Secondary | ICD-10-CM | POA: Diagnosis not present

## 2021-11-22 DIAGNOSIS — R739 Hyperglycemia, unspecified: Secondary | ICD-10-CM

## 2021-11-22 LAB — BASIC METABOLIC PANEL
Anion gap: 15 (ref 5–15)
BUN: 16 mg/dL (ref 6–20)
CO2: 21 mmol/L — ABNORMAL LOW (ref 22–32)
Calcium: 9.6 mg/dL (ref 8.9–10.3)
Chloride: 100 mmol/L (ref 98–111)
Creatinine, Ser: 1.11 mg/dL (ref 0.61–1.24)
GFR, Estimated: >60 mL/min (ref >60)
Glucose, Bld: 244 mg/dL — ABNORMAL HIGH (ref 70–99)
Potassium: 3.6 mmol/L (ref 3.5–5.1)
Sodium: 136 mmol/L (ref 135–145)

## 2021-11-22 LAB — CBC WITH DIFFERENTIAL/PLATELET
Abs Immature Granulocytes: 0.01 10*3/uL (ref 0.00–0.07)
Basophils Absolute: 0 10*3/uL (ref 0.0–0.1)
Basophils Relative: 0 %
Eosinophils Absolute: 0.1 10*3/uL (ref 0.0–0.5)
Eosinophils Relative: 2 %
HCT: 46 % (ref 39.0–52.0)
Hemoglobin: 15.3 g/dL (ref 13.0–17.0)
Immature Granulocytes: 0 %
Lymphocytes Relative: 32 %
Lymphs Abs: 2.2 10*3/uL (ref 0.7–4.0)
MCH: 28 pg (ref 26.0–34.0)
MCHC: 33.3 g/dL (ref 30.0–36.0)
MCV: 84.1 fL (ref 80.0–100.0)
Monocytes Absolute: 0.8 10*3/uL (ref 0.1–1.0)
Monocytes Relative: 12 %
Neutro Abs: 3.7 10*3/uL (ref 1.7–7.7)
Neutrophils Relative %: 54 %
Platelets: 321 10*3/uL (ref 150–400)
RBC: 5.47 MIL/uL (ref 4.22–5.81)
RDW: 12.9 % (ref 11.5–15.5)
WBC: 6.9 10*3/uL (ref 4.0–10.5)
nRBC: 0 % (ref 0.0–0.2)

## 2021-11-22 LAB — D-DIMER, QUANTITATIVE: D-Dimer, Quant: <0.27 ug/mL-FEU (ref 0.00–0.50)

## 2021-11-22 LAB — MAGNESIUM: Magnesium: 2.1 mg/dL (ref 1.7–2.4)

## 2021-11-22 LAB — CBG MONITORING, ED
Glucose-Capillary: 255 mg/dL — ABNORMAL HIGH (ref 70–99)
Glucose-Capillary: 228 mg/dL — ABNORMAL HIGH (ref 70–99)

## 2021-11-22 LAB — TROPONIN I (HIGH SENSITIVITY): Troponin I (High Sensitivity): 3 ng/L (ref <18)

## 2021-11-22 MED ORDER — TRESIBA FLEXTOUCH 200 UNIT/ML ~~LOC~~ SOPN: 34.0000 [IU] | PEN_INJECTOR | Freq: Every day | SUBCUTANEOUS | 3 refills | Status: DC

## 2021-11-22 MED ORDER — DIPHENHYDRAMINE HCL 25 MG PO CAPS
25.0000 mg | ORAL_CAPSULE | Freq: Once | ORAL | Status: AC
  Administered 2021-11-22: 25 mg via ORAL
  Filled 2021-11-22: qty 1

## 2021-11-22 MED ORDER — SODIUM CHLORIDE 0.9 % IV BOLUS
1000.0000 mL | Freq: Once | INTRAVENOUS | Status: AC
  Administered 2021-11-22: 1000 mL via INTRAVENOUS

## 2021-11-22 MED ORDER — METOCLOPRAMIDE HCL 10 MG PO TABS: 10.0000 mg | ORAL_TABLET | Freq: Four times a day (QID) | ORAL | 0 refills | Status: DC

## 2021-11-22 MED ORDER — METOCLOPRAMIDE HCL 5 MG/ML IJ SOLN
10.0000 mg | Freq: Once | INTRAMUSCULAR | Status: AC
  Administered 2021-11-22: 10 mg via INTRAVENOUS
  Filled 2021-11-22: qty 2

## 2021-11-22 NOTE — ED Notes (Signed)
Pt given water 

## 2021-11-22 NOTE — ED Provider Notes (Signed)
[facility] EMERGENCY DEPARTMENT Provider Note   CSN: [id number] Arrival date & time: 11/22/21  1258     History  Chief Complaint  Patient presents with   Hiccups    Edward Walls is a 41 y.o. male.  HPI Patient is a 41 year old male presented today with hiccups for 5 days.  He states occasionally there is so severe that he feels short of breath.  He has not tried any medication for this.  States no chest pain except when he is hiccuping when he has some sharp pains.  No recent surgeries, hospitalization, long travel, hemoptysis, estrogen containing OCP, cancer history.  No unilateral leg swelling.  No history of PE or VTE.    No nausea vomiting or diarrhea occasionally coughs.  No rhinorrhea.  He has a history of diabetes has had 1 episode of DKA but states that he will usually regulates his blood sugar relatively well.  He does however tell me that he is out of his long-acting insulin and has been for several weeks.     Home Medications Prior to Admission medications   Medication Sig Start Date End Date Taking? Authorizing Provider  metoCLOPramide (REGLAN) 10 MG tablet Take 1 tablet (10 mg total) by mouth every 6 (six) hours. 11/22/21  Yes Darey Hershberger, Kathleene Hazel, PA  Continuous Blood Gluc Sensor (FREESTYLE LIBRE 2 SENSOR) MISC CHANGE SENSOR EVERY 14 DAYS 07/22/21   [name], MD  Continuous Blood Gluc Transmit (DEXCOM G6 TRANSMITTER) MISC 1 Device by Does not apply route every 3 (three) months. 09/29/21   Philemon Kingdom, MD  DULoxetine (CYMBALTA) 60 MG capsule Take 1 capsule (60 mg total) by mouth daily. 06/21/21   Marcial Pacas, MD  gabapentin (NEURONTIN) 300 MG capsule Take 2 capsules (600 mg total) by mouth 3 (three) times daily. 06/21/21   Marcial Pacas, MD  insulin degludec (TRESIBA FLEXTOUCH) 200 UNIT/ML FlexTouch Pen Inject 34 Units into the skin daily. 11/22/21   Tedd Sias, PA  Insulin Disposable Pump (OMNIPOD 5 G6 INTRO, GEN 5,) KIT 1 each by Does not  apply route as needed. 09/29/21   Philemon Kingdom, MD  Insulin Disposable Pump (OMNIPOD 5 G6 POD, GEN 5,) MISC 1 each by Does not apply route every 3 (three) days. 09/29/21   Philemon Kingdom, MD  Insulin Pen Needle 32G X 4 MM MISC Use 4-5x a day 03/23/20   Philemon Kingdom, MD  LYUMJEV KWIKPEN 100 UNIT/ML KwikPen INJECT 12 TO 14 UNITS UNDER THE SKIN THREE TIMES A DAY BEFORE MEALS **PLEASE SCHEDULE APPOINTMENT FOR ADDITIONAL REFILLS** 11/18/21   Philemon Kingdom, MD      Allergies    Peanut-containing drug products    Review of Systems   Review of Systems  Physical Exam Updated Vital Signs BP 131/76    Pulse 90    Temp 100 F (37.8 C) (Oral)    Resp 19    Ht _0  (1.905 m)    Wt 80 kg    SpO2 100%    BMI 22.04 kg/m  Physical Exam Vitals and nursing note reviewed.  Constitutional:      General: He is not in acute distress. HENT:     Head: Normocephalic and atraumatic.     Nose: Nose normal.  Eyes:     General: No scleral icterus. Cardiovascular:     Rate and Rhythm: Regular rhythm. Tachycardia present.     Pulses: Normal pulses.     Heart sounds: Normal heart sounds.  Comments: Heart rate of 120 Pulmonary:     Effort: Pulmonary effort is normal. No respiratory distress.     Breath sounds: Normal breath sounds. No wheezing.  Abdominal:     Palpations: Abdomen is soft.     Tenderness: There is no abdominal tenderness.  Musculoskeletal:     Cervical back: Normal range of motion.     Right lower leg: No edema.     Left lower leg: No edema.  Skin:    General: Skin is warm and dry.     Capillary Refill: Capillary refill takes less than 2 seconds.  Neurological:     Mental Status: He is alert. Mental status is at baseline.  Psychiatric:        Mood and Affect: Mood normal.        Behavior: Behavior normal.    ED Results / Procedures / Treatments   Labs (all labs ordered are listed, but only abnormal results are displayed) Labs Reviewed  BASIC METABOLIC PANEL -  Abnormal; Notable for the following components:      Result Value   CO2 21 (*)    Glucose, Bld 244 (*)    All other components within normal limits  CBG MONITORING, ED - Abnormal; Notable for the following components:   Glucose-Capillary 255 (*)    All other components within normal limits  CBG MONITORING, ED - Abnormal; Notable for the following components:   Glucose-Capillary 228 (*)    All other components within normal limits  CBC WITH DIFFERENTIAL/PLATELET  MAGNESIUM  D-DIMER, QUANTITATIVE  TROPONIN I (HIGH SENSITIVITY)    EKG EKG Interpretation  Date/Time:  Tuesday November 22 2021 15:12:03 EST Ventricular Rate:  101 PR Interval:  108 QRS Duration: 86 QT Interval:  349 QTC Calculation: 453 R Axis:   86 Text Interpretation: Sinus tachycardia LAE, consider biatrial enlargement Left ventricular hypertrophy Abnormal T, consider ischemia, diffuse leads Anterior ST elevation, probably due to LVH anterior changes new from prior 8/21 Confirmed by Aletta Edouard [phone]) on 11/22/2021 3:15:20 PM  Radiology DG Chest 2 View  Result Date: 11/22/2021 CLINICAL DATA:  Hiccups for 5 days in a 41 year old male. EXAM: CHEST - 2 VIEW COMPARISON:  June 04, 2020. FINDINGS: The heart size and mediastinal contours are within normal limits. Both lungs are clear. The visualized skeletal structures are unremarkable. IMPRESSION: No active cardiopulmonary disease. Electronically Signed   By: Zetta Bills M.D.   On: 11/22/2021 15:07    Procedures Procedures    Medications Ordered in ED Medications  sodium chloride 0.9 % bolus 1,000 mL (0 mLs Intravenous Stopped 11/22/21 1650)  metoCLOPramide (REGLAN) injection 10 mg (10 mg Intravenous Given 11/22/21 1544)  diphenhydrAMINE (BENADRYL) capsule 25 mg (25 mg Oral Given 11/22/21 1544)    ED Course/ Medical Decision Making/ A&P                           Medical Decision Making Amount and/or Complexity of Data Reviewed Labs: ordered. Radiology:  ordered.  Risk Prescription drug management.   This patient presents to the ED for concern of hiccups, intractable, this involves a number of treatment options, and is a complaint that carries with it a moderate risk of complications and morbidity.  The differential diagnosis includes diaphragmatic irritation from PE, lung infarct, idiopathic   Co morbidities: Discussed in HPI   Brief History:  Intractable hiccups for 5 days tachycardia on initial examination patient also has a history of DKA  due to some difficulty controlling his type 1 diabetes  We will obtain labs we will also obtain D-dimer given some concern given his significant tachycardia  Patient IV hydrated and blood sugar improved to 228 given Reglan and Benadryl for hiccups.  On reassessment patient states hiccup frequency has decreased significantly.  Is not hiccuping currently.  EMR reviewed including pt PMHx, past surgical history and past visits to ER.   See HPI for more details   Lab Tests:  I ordered and independently interpreted labs.  The pertinent results include:    Labs notable for hyperglycemia no anion gap bicarb 21 not significantly abnormal.   Imaging Studies:  NAD. I personally reviewed all imaging studies and no acute abnormality found. I agree with radiology interpretation. Chest x-ray without acute abnormality   Cardiac Monitoring:  The patient was maintained on a cardiac monitor.  I personally viewed and interpreted the cardiac monitored which showed an underlying rhythm of: NSR EKG non-ischemic   Medicines ordered:  I ordered medication including Reglan Benadryl fluids for tachycardia and hiccups Reevaluation of the patient after these medicines showed that the patient resolved I have reviewed the patients home medicines and have made adjustments as needed   Critical Interventions:     Consults:  I briefly discussed my attending physician I did not have any consult  discussions     Reevaluation:  After the interventions noted above I re-evaluated patient and found that they have :resolved   Social Determinants of Health:  The patient's social determinants of health were a factor in the care of this patient    Problem List / ED Course:  Hiccups.  Recommend PCP follow-up Hiccups now resolved   Dispostion:  After consideration of the diagnostic results and the patients response to treatment, I feel that the patent would benefit from Reglan Benadryl prescribed.  Patient prescribed his Tyler Aas which was refilled by myself.  He will need to follow-up with PCP.  Return precautions given   Final Clinical Impression(s) / ED Diagnoses Final diagnoses:  Hiccup  Dehydration  Hyperglycemia  Abnormal EKG    Rx / DC Orders ED Discharge Orders          Ordered    insulin degludec (TRESIBA FLEXTOUCH) 200 UNIT/ML FlexTouch Pen  Daily        11/22/21 1643    metoCLOPramide (REGLAN) 10 MG tablet  Every 6 hours        11/22/21 1648              Tedd Sias, [place] 11/24/21 1554    Hayden Rasmussen, MD 11/25/21 1539

## 2021-11-22 NOTE — Discharge Instructions (Addendum)
Please follow-up with your primary care provider.  You may further discuss your abnormal EKG with your primary care doctor and follow direction on whether to follow-up with cardiology.  Given your concerning symptoms you may always return to the emergency room for reevaluation.  I have prescribed a medication of Reglan to take 3 times daily for the next few days to see if this improves your hiccups.  If this causes any agitation or tremors or antsiness you may take 25 mg of Benadryl with each dose.  Drink plenty of water take your insulin as prescribed.

## 2021-11-22 NOTE — ED Provider Notes (Signed)
Patient redirected to the emergency room for further work-up of 5 days of hiccups and vomiting.  No services were provided today.   Theadora Rama Scales, PA-C 11/22/21 1244

## 2021-11-22 NOTE — ED Triage Notes (Signed)
Pt present excessive hiccups, symptoms started 5 days ago. Pt states that the hiccups is started to hurt in his chest area. Pt states it seem like acid come back up in his throat.

## 2021-11-22 NOTE — ED Triage Notes (Signed)
Hiccups x 5 days.

## 2022-01-09 ENCOUNTER — Other Ambulatory Visit: Payer: Self-pay | Admitting: Internal Medicine

## 2022-01-10 MED ORDER — LYUMJEV KWIKPEN 100 UNIT/ML ~~LOC~~ SOPN: PEN_INJECTOR | SUBCUTANEOUS | 0 refills | Status: DC

## 2022-02-16 ENCOUNTER — Other Ambulatory Visit: Payer: Self-pay | Admitting: Internal Medicine

## 2022-02-20 ENCOUNTER — Encounter: Payer: Self-pay | Admitting: Internal Medicine

## 2022-02-20 ENCOUNTER — Ambulatory Visit: Payer: Managed Care, Other (non HMO) | Admitting: Internal Medicine

## 2022-02-20 VITALS — BP 128/88 | HR 127 | Ht 75.0 in | Wt 145.2 lb

## 2022-02-20 DIAGNOSIS — E1010 Type 1 diabetes mellitus with ketoacidosis without coma: Secondary | ICD-10-CM

## 2022-02-20 DIAGNOSIS — E785 Hyperlipidemia, unspecified: Secondary | ICD-10-CM | POA: Diagnosis not present

## 2022-02-20 DIAGNOSIS — E139 Other specified diabetes mellitus without complications: Secondary | ICD-10-CM

## 2022-02-20 LAB — POCT GLYCOSYLATED HEMOGLOBIN (HGB A1C): HbA1c POC (<> result, manual entry): >15 % (ref 4.0–5.6)

## 2022-02-20 MED ORDER — OMNIPOD 5 DEXG7G6 INTRO GEN 5 KIT: 1.0000 | PACK | 0 refills | Status: DC | PRN

## 2022-02-20 MED ORDER — DEXCOM G6 TRANSMITTER MISC: 1.0000 | 3 refills | Status: DC

## 2022-02-20 MED ORDER — OMNIPOD 5 DEXG7G6 PODS GEN 5 MISC: 1.0000 | 3 refills | Status: DC

## 2022-02-20 MED ORDER — DEXCOM G6 SENSOR MISC: 1.0000 | 3 refills | Status: DC

## 2022-02-20 MED ORDER — DEXCOM G6 SENSOR MISC: 1.0000 | 3 refills | Status: AC

## 2022-02-20 NOTE — Patient Instructions (Addendum)
Please schedule an appt with diabetes education and nutrition. [phone]. ? ?Get the Omnipod 5 and Dexcom from the pharmacy. ? ?Please restart: ?- Tresiba 34 units daily  ? ?Please use: ?- Lyumjev 16-18 units, depending on the size of the meal (in the abdomen, thighs or upper arms) ?- Lyumjev sliding scale: ?- 150-175: + 1 unit  ?- 176-200: + 2 units  ?- 201-225: + 3 units  ?- 226-250: + 4 units  ?- 251-275: + 5 units ?- 276-300: + 6 units ?>300: + 7 units ? ?Schedule a new eye exam. ? ?Please return in 3 months. ?  ?

## 2022-02-20 NOTE — Progress Notes (Signed)
Patient ID: Edward Walls, male   DOB: Oct 17, 1980, 41 y.o.   MRN: [id number]  ? ?This visit occurred during the SARS-CoV-2 public health emergency.  Safety protocols were in place, including screening questions prior to the visit, additional usage of staff PPE, and extensive cleaning of exam room while observing appropriate contact time as indicated for disinfecting solutions.  ? ?HPI: ?Edward Walls is a 41 y.o.-year-old male, returning for follow-up for DM1, dx in 2017, insulin-dependent, uncontrolled, with complications (recurrent DKA episodes).  Last visit 5 months ago. ? ?Interim history: ?No increased urination, blurry vision, nausea, chest pain. He does have mental fog. ?His OmniPod pump was approved, however, he was not able to obtain it from the pharmacy. ?He was recently in the emergency room with intractable hiccups. ?He tells me that he has been off his [place] for a month as he did not fill the prescription!!!! ? ?Reviewed HbA1c levels: ?Lab Results  ?Component Value Date  ? HGBA1C >15 09/29/2021  ? HGBA1C 12.2 (A) 07/01/2021  ? HGBA1C 13.7 (A) 11/24/2020  ? HGBA1C >15.0 09/24/2020  ? HGBA1C >15.5 (H) 06/04/2020  ? HGBA1C 17.2 (H) 01/28/2020  ? HGBA1C >15.5 (H) 10/14/2018  ? HGBA1C 15.9 (H) 03/14/2018  ? HGBA1C 14.5 (H) 07/04/2017  ? HGBA1C 12.0 (H) 10/20/2016  ?07/05/2017: GAD Ab <5 ? ?Currently on:  ?- Tresiba 40 units daily (in the abdomen) >> 34 units daily >> OFF for 1 month - did not refill it!!! ?- Lyumjev 6-10 units >> 10-16 units, depending on the size of the meal (in the abdomen, thighs or upper arms) >> 18 units  - misses it seldom ? ?He was previously on the freestyle libre CGM but developed irritation due to adhesive. Currently on freestyle libre 2: ? ?Previously: ? ? ?Previously: ? ? ?Lowest sugar was  50s >> 54 >> 91 >> 320; he has hypoglycemia awareness in the 80s. ?Highest sugar was HI >> HI >> HI >> HI. ?He had 1 previous DKA episode when he could not afford his insulin in the  past. ? ?Glucometer: Freestyle Libre >> [employer] 2 ? ?Pt's meals are: ?- Breakfast: oatmeal + banana ?- Lunch: meatloaf, chilli, salad ?- Dinner: meat + veggies ?- Snacks: 8 pm  - crackers, at work -he works nights ? ?No CKD, last BUN/creatinine:  ?Lab Results  ?Component Value Date  ? BUN 16 11/22/2021  ? BUN 13 05/19/2021  ? CREATININE 1.11 11/22/2021  ? CREATININE 0.81 05/19/2021  ? ?He has dyslipidemia: ?Lab Results  ?Component Value Date  ? CHOL 141 05/19/2021  ? HDL 29.30 (L) 05/19/2021  ? [place] 93 05/19/2021  ? TRIG 94.0 05/19/2021  ? CHOLHDL 5 05/19/2021  ? ?-No previous eye exams. I recommended that he sees Dr. Prudencio Burly and gave him his information. He did not see him yet.  He tells me that his insurance covers another provider and he will try to establish care with him. ? ?-He denies numbness and tingling in his feet. ? ?Pt has FH of DM in M, F, all 3 of his brothers - all have DM2. ? ?ROS: ?+ See HPI ? ?I reviewed pt's medications, allergies, PMH, social hx, family hx, and changes were documented in the history of present illness. Otherwise, unchanged from my initial visit note. ? ?Past Medical History:  ?Diagnosis Date  ? Diabetes mellitus without complication ([place])   ? Leg pain   ? Leg weakness   ? Seasonal allergies   ? ?Past Surgical  History:  ?Procedure Laterality Date  ? DENTAL SURGERY    ? ?Social History  ? ?Socioeconomic History  ? Marital status: Single  ?  Spouse name: Not on file  ? Number of children:  To: 39 and 17 10/2008  ? Years of education: Not on file  ? Highest education level: Not on file  ?Occupational History  ?  Distribution receiver for [employer]  ?Social Needs  ? Financial resource strain: Not on file  ? Food insecurity:  ?  Worry: Not on file  ?  Inability: Not on file  ? Transportation needs:  ?  Medical: Not on file  ?  Non-medical: Not on file  ?Tobacco Use  ? Smoking status: Former Smoker  ?  Types: Cigarettes  ? Smokeless tobacco: Never Used  ?Substance and  Sexual Activity  ? Alcohol use:  no  ?  Alcohol/week:   Types:  Drug use: Not Currently  ? Sexual activity: Not on file  ?Lifestyle  ? Physical activity:  ?  Days per week: Not on file  ?  Minutes per session: Not on file  ? Stress: Not on file  ?Relationships  ? Social connections:  ?  Talks on phone: Not on file  ?  Gets together: Not on file  ?  Attends religious service: Not on file  ?  Active member of club or organization: Not on file  ?  Attends meetings of clubs or organizations: Not on file  ?  Relationship status: Not on file  ? Intimate partner violence:  ?  Fear of current or ex partner: Not on file  ?  Emotionally abused: Not on file  ?  Physically abused: Not on file  ?  Forced sexual activity: Not on file  ?Other Topics Concern  ? Not on file  ?Social History Narrative  ? Not on file  ? ?Current Outpatient Medications on File Prior to Visit  ?Medication Sig Dispense Refill  ? Continuous Blood Gluc Sensor (FREESTYLE LIBRE 2 SENSOR) MISC CHANGE SENSOR EVERY 14 DAYS 6 each 3  ? Continuous Blood Gluc Transmit (DEXCOM G6 TRANSMITTER) MISC 1 Device by Does not apply route every 3 (three) months. 1 each 3  ? DULoxetine (CYMBALTA) 60 MG capsule Take 1 capsule (60 mg total) by mouth daily. 30 capsule 11  ? gabapentin (NEURONTIN) 300 MG capsule Take 2 capsules (600 mg total) by mouth 3 (three) times daily. 180 capsule 11  ? insulin degludec (TRESIBA FLEXTOUCH) 200 UNIT/ML FlexTouch Pen Inject 34 Units into the skin daily. 12 mL 3  ? Insulin Disposable Pump (OMNIPOD 5 G6 INTRO, GEN 5,) KIT 1 each by Does not apply route as needed. 1 kit 0  ? Insulin Disposable Pump (OMNIPOD 5 G6 POD, GEN 5,) MISC 1 each by Does not apply route every 3 (three) days. 30 each 3  ? Insulin Lispro-aabc (LYUMJEV KWIKPEN) 100 UNIT/ML KwikPen INJECT 12 TO 14 UNITS UNDER THE SKIN THREE TIMES A DAY BEFORE MEALS - NEED APPOINTMENT FOR ADDITIONAL REFILLS 15 mL 0  ? Insulin Pen Needle 32G X 4 MM MISC Use 4-5x a day 300 each 3  ?  metoCLOPramide (REGLAN) 10 MG tablet Take 1 tablet (10 mg total) by mouth every 6 (six) hours. 30 tablet 0  ? ?No current facility-administered medications on file prior to visit.  ? ?Allergies  ?Allergen Reactions  ? Peanut-Containing Drug Products Other (See Comments)  ?  Throat burning   ? ?Family History  ?Problem  Relation Age of Onset  ? Diabetes Mellitus II Mother   ? Diabetes Mellitus II Father   ? Diabetes Mellitus II Brother   ? ?PE: ?BP 128/88 (BP Location: Right Arm, Patient Position: Sitting, Cuff Size: Normal)   Pulse (!) 127   Ht 6' 3" (1.905 m)   Wt 145 lb 3.2 oz (65.9 kg)   SpO2 98%   BMI 18.15 kg/m?  ?Wt Readings from Last 3 Encounters:  ?02/20/22 145 lb 3.2 oz (65.9 kg)  ?11/22/21 176 lb 5.9 oz (80 kg)  ?09/29/21 176 lb 6.4 oz (80 kg)  ?      ?Constitutional: normal weight, in NAD ?Eyes: PERRLA, EOMI, no exophthalmos ?ENT: moist mucous membranes, no thyromegaly, no cervical lymphadenopathy ?Cardiovascular: Tachycardia, RR, No MRG ?Respiratory: CTA B ?Musculoskeletal: no deformities, strength intact in all 4 ?Skin: moist, warm, no rashes ?Neurological: no tremor with outstretched hands, DTR normal in all 4 ? ?ASSESSMENT: ?1. DM1, insulin-dependent, uncontrolled, without long-term complications, but with DKA episodes ? ?Component ?    Latest Ref Rng & Units 12/12/2018  ?Glucose, Plasma ?    65 - 99 mg/dL 144 (H)  ?C-Peptide ?    0.80 - 3.85 ng/mL 0.52 (L)  ?Islet Cell Ab ?    Neg:<1:1 Negative  ?ZNT8 Antibodies ?    U/mL <15  ? ?2.  Dyslipidemia ? ?PLAN:  ?1. Patient with longstanding, very uncontrolled, diabetes, diagnosed as type I in 2020, with history of DKA, in the setting of not affording his insulin.  Afterwards, he started to be able to afford it but he was not taking it consistently.  Afterwards, the barrier to improving his diabetes control was him working long hours.  At last visit I strongly suggested to start on an insulin pump and his OmniPod was approved by the insurance but he  tells me that he was not able to obtain it from the pharmacy.  He will did not let me know about this so he now comes 5 months later not being on his insulin pump and, moreover, being off his long-acting insulin.  Upon questioni

## 2022-02-21 ENCOUNTER — Other Ambulatory Visit: Payer: Self-pay | Admitting: Internal Medicine

## 2022-02-21 ENCOUNTER — Telehealth: Payer: Self-pay

## 2022-02-21 MED ORDER — LANTUS SOLOSTAR 100 UNIT/ML ~~LOC~~ SOPN: 34.0000 [IU] | PEN_INJECTOR | Freq: Every day | SUBCUTANEOUS | 5 refills | Status: DC

## 2022-02-21 NOTE — Telephone Encounter (Signed)
I sent Lantus to the same [facility]. ?

## 2022-02-21 NOTE — Telephone Encounter (Signed)
Pt called to advise rx for Edward Walls was over $800. He advised he received notification that under his new insurance tresiba is not covered and requested an alternative be sent to the pharmacy.  ?

## 2022-02-23 NOTE — Telephone Encounter (Signed)
Pt contacted and advised rx sent to preferred pharmacy and ready for pick up. ?

## 2022-03-27 ENCOUNTER — Other Ambulatory Visit: Payer: Self-pay | Admitting: Neurology

## 2022-03-30 ENCOUNTER — Other Ambulatory Visit: Payer: Self-pay | Admitting: Neurology

## 2022-03-30 ENCOUNTER — Telehealth: Payer: Self-pay | Admitting: Neurology

## 2022-03-30 MED ORDER — GABAPENTIN 300 MG PO CAPS: 600.0000 mg | ORAL_CAPSULE | Freq: Three times a day (TID) | ORAL | 1 refills | Status: DC

## 2022-03-30 NOTE — Telephone Encounter (Signed)
I called the pt and advised we could provide a bridge for his med until he can completed his f/u.  90 with 1 refill called in for gabapentin 300 to [facility].  F.u scheduled for 06/27/2022 at 730 am.

## 2022-03-30 NOTE — Telephone Encounter (Signed)
Pt would like a call from the nurse to discuss a sooner appt than September. Only have 3 tablets left of Gabapentin.

## 2022-05-24 ENCOUNTER — Inpatient Hospital Stay (HOSPITAL_COMMUNITY)
Admission: EM | Admit: 2022-05-24 | Discharge: 2022-05-26 | DRG: 638 | Disposition: A | Discharge status: Home / Self Care | Payer: Managed Care, Other (non HMO) | Attending: Internal Medicine | Admitting: Internal Medicine

## 2022-05-24 ENCOUNTER — Other Ambulatory Visit: Payer: Self-pay

## 2022-05-24 ENCOUNTER — Emergency Department (HOSPITAL_COMMUNITY): Payer: Managed Care, Other (non HMO)

## 2022-05-24 DIAGNOSIS — Z794 Long term (current) use of insulin: Secondary | ICD-10-CM | POA: Diagnosis not present

## 2022-05-24 DIAGNOSIS — Z79899 Other long term (current) drug therapy: Secondary | ICD-10-CM

## 2022-05-24 DIAGNOSIS — R066 Hiccough: Secondary | ICD-10-CM | POA: Diagnosis not present

## 2022-05-24 DIAGNOSIS — M792 Neuralgia and neuritis, unspecified: Secondary | ICD-10-CM | POA: Diagnosis not present

## 2022-05-24 DIAGNOSIS — N179 Acute kidney failure, unspecified: Secondary | ICD-10-CM | POA: Diagnosis present

## 2022-05-24 DIAGNOSIS — D649 Anemia, unspecified: Secondary | ICD-10-CM | POA: Diagnosis present

## 2022-05-24 DIAGNOSIS — E876 Hypokalemia: Secondary | ICD-10-CM | POA: Diagnosis not present

## 2022-05-24 DIAGNOSIS — Z91010 Allergy to peanuts: Secondary | ICD-10-CM

## 2022-05-24 DIAGNOSIS — E1010 Type 1 diabetes mellitus with ketoacidosis without coma: Principal | ICD-10-CM | POA: Diagnosis present

## 2022-05-24 DIAGNOSIS — I4892 Unspecified atrial flutter: Secondary | ICD-10-CM | POA: Diagnosis not present

## 2022-05-24 DIAGNOSIS — Z91018 Allergy to other foods: Secondary | ICD-10-CM

## 2022-05-24 DIAGNOSIS — E1110 Type 2 diabetes mellitus with ketoacidosis without coma: Secondary | ICD-10-CM | POA: Diagnosis present

## 2022-05-24 DIAGNOSIS — E1042 Type 1 diabetes mellitus with diabetic polyneuropathy: Secondary | ICD-10-CM | POA: Diagnosis present

## 2022-05-24 DIAGNOSIS — E875 Hyperkalemia: Secondary | ICD-10-CM | POA: Diagnosis present

## 2022-05-24 DIAGNOSIS — Z87891 Personal history of nicotine dependence: Secondary | ICD-10-CM

## 2022-05-24 DIAGNOSIS — Z833 Family history of diabetes mellitus: Secondary | ICD-10-CM

## 2022-05-24 DIAGNOSIS — E139 Other specified diabetes mellitus without complications: Secondary | ICD-10-CM

## 2022-05-24 DIAGNOSIS — D72829 Elevated white blood cell count, unspecified: Secondary | ICD-10-CM | POA: Diagnosis present

## 2022-05-24 DIAGNOSIS — E1065 Type 1 diabetes mellitus with hyperglycemia: Secondary | ICD-10-CM | POA: Diagnosis not present

## 2022-05-24 LAB — BASIC METABOLIC PANEL
Anion gap: >20 — ABNORMAL HIGH (ref 5–15)
Anion gap: 11 (ref 5–15)
BUN: 34 mg/dL — ABNORMAL HIGH (ref 6–20)
BUN: 31 mg/dL — ABNORMAL HIGH (ref 6–20)
BUN: 30 mg/dL — ABNORMAL HIGH (ref 6–20)
BUN: 26 mg/dL — ABNORMAL HIGH (ref 6–20)
CO2: <7 mmol/L — ABNORMAL LOW (ref 22–32)
CO2: <7 mmol/L — ABNORMAL LOW (ref 22–32)
CO2: 9 mmol/L — ABNORMAL LOW (ref 22–32)
CO2: 16 mmol/L — ABNORMAL LOW (ref 22–32)
Calcium: 8.4 mg/dL — ABNORMAL LOW (ref 8.9–10.3)
Calcium: 7.7 mg/dL — ABNORMAL LOW (ref 8.9–10.3)
Calcium: 9.3 mg/dL (ref 8.9–10.3)
Calcium: 8.9 mg/dL (ref 8.9–10.3)
Chloride: 97 mmol/L — ABNORMAL LOW (ref 98–111)
Chloride: 111 mmol/L (ref 98–111)
Chloride: 108 mmol/L (ref 98–111)
Chloride: 113 mmol/L — ABNORMAL HIGH (ref 98–111)
Creatinine, Ser: 2.15 mg/dL — ABNORMAL HIGH (ref 0.61–1.24)
Creatinine, Ser: 1.65 mg/dL — ABNORMAL HIGH (ref 0.61–1.24)
Creatinine, Ser: 1.59 mg/dL — ABNORMAL HIGH (ref 0.61–1.24)
Creatinine, Ser: 0.95 mg/dL (ref 0.61–1.24)
GFR, Estimated: 39 mL/min — ABNORMAL LOW (ref >60)
GFR, Estimated: 54 mL/min — ABNORMAL LOW (ref >60)
GFR, Estimated: 56 mL/min — ABNORMAL LOW (ref >60)
GFR, Estimated: >60 mL/min (ref >60)
Glucose, Bld: 848 mg/dL (ref 70–99)
Glucose, Bld: 517 mg/dL (ref 70–99)
Glucose, Bld: 347 mg/dL — ABNORMAL HIGH (ref 70–99)
Glucose, Bld: 222 mg/dL — ABNORMAL HIGH (ref 70–99)
Potassium: 6 mmol/L — ABNORMAL HIGH (ref 3.5–5.1)
Potassium: 4.2 mmol/L (ref 3.5–5.1)
Potassium: 5 mmol/L (ref 3.5–5.1)
Potassium: 4 mmol/L (ref 3.5–5.1)
Sodium: 130 mmol/L — ABNORMAL LOW (ref 135–145)
Sodium: 138 mmol/L (ref 135–145)
Sodium: 140 mmol/L (ref 135–145)
Sodium: 140 mmol/L (ref 135–145)

## 2022-05-24 LAB — URINALYSIS, ROUTINE W REFLEX MICROSCOPIC
Bilirubin Urine: NEGATIVE
Glucose, UA: >=500 mg/dL — AB
Ketones, ur: 80 mg/dL — AB
Leukocytes,Ua: NEGATIVE
Nitrite: NEGATIVE
Protein, ur: 100 mg/dL — AB
Specific Gravity, Urine: 1.022 (ref 1.005–1.030)
pH: 5 (ref 5.0–8.0)

## 2022-05-24 LAB — GLUCOSE, CAPILLARY
Glucose-Capillary: 551 mg/dL (ref 70–99)
Glucose-Capillary: 391 mg/dL — ABNORMAL HIGH (ref 70–99)
Glucose-Capillary: 522 mg/dL (ref 70–99)
Glucose-Capillary: 348 mg/dL — ABNORMAL HIGH (ref 70–99)
Glucose-Capillary: 262 mg/dL — ABNORMAL HIGH (ref 70–99)
Glucose-Capillary: 224 mg/dL — ABNORMAL HIGH (ref 70–99)
Glucose-Capillary: 237 mg/dL — ABNORMAL HIGH (ref 70–99)
Glucose-Capillary: 216 mg/dL — ABNORMAL HIGH (ref 70–99)
Glucose-Capillary: 195 mg/dL — ABNORMAL HIGH (ref 70–99)

## 2022-05-24 LAB — CBC WITH DIFFERENTIAL/PLATELET
Abs Immature Granulocytes: 0.43 10*3/uL — ABNORMAL HIGH (ref 0.00–0.07)
Basophils Absolute: 0.1 10*3/uL (ref 0.0–0.1)
Basophils Relative: 0 %
Eosinophils Absolute: 0 10*3/uL (ref 0.0–0.5)
Eosinophils Relative: 0 %
HCT: 48.1 % (ref 39.0–52.0)
Hemoglobin: 15 g/dL (ref 13.0–17.0)
Immature Granulocytes: 2 %
Lymphocytes Relative: 7 %
Lymphs Abs: 1.6 10*3/uL (ref 0.7–4.0)
MCH: 28.2 pg (ref 26.0–34.0)
MCHC: 31.2 g/dL (ref 30.0–36.0)
MCV: 90.4 fL (ref 80.0–100.0)
Monocytes Absolute: 1.8 10*3/uL — ABNORMAL HIGH (ref 0.1–1.0)
Monocytes Relative: 8 %
Neutro Abs: 18.9 10*3/uL — ABNORMAL HIGH (ref 1.7–7.7)
Neutrophils Relative %: 83 %
Platelets: 318 10*3/uL (ref 150–400)
RBC: 5.32 MIL/uL (ref 4.22–5.81)
RDW: 11.8 % (ref 11.5–15.5)
WBC: 22.9 10*3/uL — ABNORMAL HIGH (ref 4.0–10.5)
nRBC: 0 % (ref 0.0–0.2)

## 2022-05-24 LAB — BLOOD GAS, VENOUS
Acid-base deficit: 27.3 mmol/L — ABNORMAL HIGH (ref 0.0–2.0)
Bicarbonate: 4.3 mmol/L — ABNORMAL LOW (ref 20.0–28.0)
O2 Saturation: 70.9 %
Patient temperature: 37
pCO2, Ven: 21 mmHg — ABNORMAL LOW (ref 44–60)
pH, Ven: <6.95 — CL (ref 7.25–7.43)
pO2, Ven: 44 mmHg (ref 32–45)

## 2022-05-24 LAB — CBC
HCT: 47.3 % (ref 39.0–52.0)
Hemoglobin: 15.9 g/dL (ref 13.0–17.0)
MCH: 28.8 pg (ref 26.0–34.0)
MCHC: 33.6 g/dL (ref 30.0–36.0)
MCV: 85.7 fL (ref 80.0–100.0)
Platelets: 264 10*3/uL (ref 150–400)
RBC: 5.52 MIL/uL (ref 4.22–5.81)
RDW: 11.8 % (ref 11.5–15.5)
WBC: 20.2 10*3/uL — ABNORMAL HIGH (ref 4.0–10.5)
nRBC: 0 % (ref 0.0–0.2)

## 2022-05-24 LAB — BETA-HYDROXYBUTYRIC ACID
Beta-Hydroxybutyric Acid: >8 mmol/L — ABNORMAL HIGH (ref 0.05–0.27)
Beta-Hydroxybutyric Acid: 3.58 mmol/L — ABNORMAL HIGH (ref 0.05–0.27)

## 2022-05-24 LAB — TROPONIN I (HIGH SENSITIVITY)
Troponin I (High Sensitivity): 10 ng/L (ref <18)
Troponin I (High Sensitivity): 9 ng/L (ref <18)

## 2022-05-24 LAB — MAGNESIUM: Magnesium: 1.9 mg/dL (ref 1.7–2.4)

## 2022-05-24 LAB — CBG MONITORING, ED
Glucose-Capillary: >600 mg/dL (ref 70–99)
Glucose-Capillary: >600 mg/dL (ref 70–99)
Glucose-Capillary: >600 mg/dL (ref 70–99)
Glucose-Capillary: >600 mg/dL (ref 70–99)

## 2022-05-24 LAB — HIV ANTIBODY (ROUTINE TESTING W REFLEX): HIV Screen 4th Generation wRfx: NONREACTIVE

## 2022-05-24 LAB — MRSA NEXT GEN BY PCR, NASAL: MRSA by PCR Next Gen: NOT DETECTED

## 2022-05-24 MED ORDER — SODIUM CHLORIDE 0.9 % IV SOLN
1.0000 g | INTRAVENOUS | Status: DC
  Administered 2022-05-24: 1 g via INTRAVENOUS
  Filled 2022-05-24 (×2): qty 10

## 2022-05-24 MED ORDER — CHLORHEXIDINE GLUCONATE CLOTH 2 % EX PADS
6.0000 | MEDICATED_PAD | Freq: Every day | CUTANEOUS | Status: DC
  Administered 2022-05-24 – 2022-05-25 (×2): 6 via TOPICAL

## 2022-05-24 MED ORDER — PROCHLORPERAZINE EDISYLATE 10 MG/2ML IJ SOLN: 10.0000 mg | Freq: Once | INTRAMUSCULAR | Status: DC

## 2022-05-24 MED ORDER — PANTOPRAZOLE SODIUM 40 MG IV SOLR
40.0000 mg | Freq: Two times a day (BID) | INTRAVENOUS | Status: DC
  Administered 2022-05-24 – 2022-05-26 (×4): 40 mg via INTRAVENOUS
  Filled 2022-05-24 (×4): qty 10

## 2022-05-24 MED ORDER — LACTATED RINGERS IV BOLUS
2000.0000 mL | Freq: Once | INTRAVENOUS | Status: AC
  Administered 2022-05-24: 2000 mL via INTRAVENOUS

## 2022-05-24 MED ORDER — DEXTROSE IN LACTATED RINGERS 5 % IV SOLN: INTRAVENOUS | Status: DC

## 2022-05-24 MED ORDER — LACTATED RINGERS IV SOLN: INTRAVENOUS | Status: DC

## 2022-05-24 MED ORDER — HEPARIN SODIUM (PORCINE) 5000 UNIT/ML IJ SOLN
5000.0000 [IU] | Freq: Three times a day (TID) | INTRAMUSCULAR | Status: DC
  Administered 2022-05-24 – 2022-05-26 (×7): 5000 [IU] via SUBCUTANEOUS
  Filled 2022-05-24 (×7): qty 1

## 2022-05-24 MED ORDER — ORAL CARE MOUTH RINSE: 15.0000 mL | OROMUCOSAL | Status: DC | PRN

## 2022-05-24 MED ORDER — ONDANSETRON HCL 4 MG/2ML IJ SOLN
4.0000 mg | Freq: Four times a day (QID) | INTRAMUSCULAR | Status: DC | PRN
  Administered 2022-05-24: 4 mg via INTRAVENOUS
  Filled 2022-05-24: qty 2

## 2022-05-24 MED ORDER — DEXTROSE 50 % IV SOLN: 0.0000 mL | INTRAVENOUS | Status: DC | PRN

## 2022-05-24 MED ORDER — POLYETHYLENE GLYCOL 3350 17 G PO PACK: 17.0000 g | PACK | Freq: Every day | ORAL | Status: DC | PRN

## 2022-05-24 MED ORDER — LACTATED RINGERS IV BOLUS
20.0000 mL/kg | Freq: Once | INTRAVENOUS | Status: AC
Start: 2022-05-24 — End: 2022-05-24
  Administered 2022-05-24: 1452 mL via INTRAVENOUS

## 2022-05-24 MED ORDER — METOPROLOL TARTRATE 5 MG/5ML IV SOLN
2.5000 mg | INTRAVENOUS | Status: DC | PRN
  Administered 2022-05-24: 2.5 mg via INTRAVENOUS
  Filled 2022-05-24: qty 5

## 2022-05-24 MED ORDER — DOCUSATE SODIUM 100 MG PO CAPS: 100.0000 mg | ORAL_CAPSULE | Freq: Two times a day (BID) | ORAL | Status: DC | PRN

## 2022-05-24 MED ORDER — PROCHLORPERAZINE EDISYLATE 10 MG/2ML IJ SOLN
10.0000 mg | Freq: Four times a day (QID) | INTRAMUSCULAR | Status: DC | PRN
  Administered 2022-05-25: 10 mg via INTRAVENOUS
  Filled 2022-05-24: qty 2

## 2022-05-24 MED ORDER — GABAPENTIN 100 MG PO CAPS
100.0000 mg | ORAL_CAPSULE | Freq: Two times a day (BID) | ORAL | Status: DC
Start: 2022-05-24 — End: 2022-05-25
  Administered 2022-05-24: 100 mg via ORAL
  Filled 2022-05-24 (×2): qty 1

## 2022-05-24 MED ORDER — INSULIN REGULAR(HUMAN) IN NACL 100-0.9 UT/100ML-% IV SOLN
INTRAVENOUS | Status: DC
  Administered 2022-05-24: 10.5 [IU]/h via INTRAVENOUS
  Administered 2022-05-24: 11.5 [IU]/h via INTRAVENOUS
  Administered 2022-05-24: 5 [IU]/h via INTRAVENOUS
  Administered 2022-05-26: 2.6 [IU]/h via INTRAVENOUS
  Filled 2022-05-24 (×3): qty 100

## 2022-05-24 NOTE — Progress Notes (Signed)
eLink Physician-Brief Progress Note Patient Name: AKEEL REFFNER DOB: 1981-03-22 MRN: [id number]   Date of Service  05/24/2022  HPI/Events of Note  EKG done for ST elevation alarm on bedside monitor reveals: Atrial flutter with ventricular rate = 118  Minimal voltage criteria for LVH, may be normal variant ( Sokolow-Lyon ) Nonspecific ST and T wave abnormality. However, the bedside monitor looks like sinus tachycardia with HR = 123. Last K+ = 5.0. BP = 123/88.  eICU Interventions  Plan: Mg++ level STAT.  Metoprolol 2.5 mg IV Q 3 hours PRN HR > 115. Hold dose for SBP < 105.     Intervention Category Major Interventions: Arrhythmia - evaluation and management  Aryahi Denzler [place] 05/24/2022, 10:07 PM

## 2022-05-24 NOTE — ED Provider Notes (Signed)
[facility] DEPT Provider Note   CSN: [id number] Arrival date & time: 05/24/22  1112     History  Chief Complaint  Patient presents with   Hyperglycemia    Edward Walls is a 41 y.o. male.  HPI 41 year old male history of diabetes presents today with DKA.  Patient states he has been trying to stretch his insulin since he was on vacation.  He states he is having generalized weakness and pain as well as nausea vomiting .  He reports this is similar to prior DKA episodes.  Symptoms began approximately 2 days ago.    Home Medications Prior to Admission medications   Medication Sig Start Date End Date Taking? Authorizing Provider  Continuous Blood Gluc Sensor (FREESTYLE LIBRE 2 SENSOR) MISC CHANGE SENSOR EVERY 14 DAYS 07/22/21   [name], MD  Continuous Blood Gluc Transmit (DEXCOM G6 TRANSMITTER) MISC 1 Device by Does not apply route every 3 (three) months. 02/20/22   Philemon Kingdom, MD  Continuous Blood Gluc Transmit (DEXCOM G6 TRANSMITTER) MISC 1 Device by Does not apply route every 3 (three) months. 02/20/22   Philemon Kingdom, MD  DULoxetine (CYMBALTA) 60 MG capsule Take 1 capsule (60 mg total) by mouth daily. 06/21/21   Marcial Pacas, MD  gabapentin (NEURONTIN) 300 MG capsule Take 2 capsules (600 mg total) by mouth 3 (three) times daily. 03/30/22   Marcial Pacas, MD  Insulin Disposable Pump (OMNIPOD 5 G6 INTRO, GEN 5,) KIT 1 each by Does not apply route as needed. 02/20/22   Philemon Kingdom, MD  Insulin Disposable Pump (OMNIPOD 5 G6 POD, GEN 5,) MISC 1 each by Does not apply route every 3 (three) days. 02/20/22   Philemon Kingdom, MD  insulin glargine (LANTUS SOLOSTAR) 100 UNIT/ML Solostar Pen Inject 34 Units into the skin daily. 02/21/22   Philemon Kingdom, MD  Insulin Lispro-aabc (LYUMJEV KWIKPEN) 100 UNIT/ML KwikPen INJECT 12 TO 14 UNITS UNDER THE SKIN THREE TIMES A DAY BEFORE MEALS - NEED APPOINTMENT FOR ADDITIONAL REFILLS 02/16/22   Philemon Kingdom, MD  Insulin Pen Needle 32G X 4 MM MISC Use 4-5x a day 03/23/20   Philemon Kingdom, MD  metoCLOPramide (REGLAN) 10 MG tablet Take 1 tablet (10 mg total) by mouth every 6 (six) hours. 11/22/21   Tedd Sias, PA      Allergies    Peanut-containing drug products    Review of Systems   Review of Systems  Physical Exam Updated Vital Signs BP (!) 146/95   Pulse (!) 126   Temp (!) 97.5 F (36.4 C) (Oral)   Resp (!) 24   Ht 1.905 m (_0 )   Wt 72.6 kg   SpO2 99%   BMI 20.00 kg/m  Physical Exam Vitals and nursing note reviewed.  Constitutional:      General: He is not in acute distress.    Appearance: Normal appearance. He is ill-appearing.  HENT:     Head: Normocephalic.     Right Ear: External ear normal.     Left Ear: External ear normal.     Nose: Nose normal.     Mouth/Throat:     Mouth: Mucous membranes are dry.  Eyes:     Pupils: Pupils are equal, round, and reactive to light.  Cardiovascular:     Rate and Rhythm: Regular rhythm. Tachycardia present.     Pulses: Normal pulses.  Pulmonary:     Effort: Pulmonary effort is normal.     Breath sounds:  Normal breath sounds.  Abdominal:     General: Abdomen is flat. Bowel sounds are normal.     Palpations: Abdomen is soft.  Musculoskeletal:        General: Normal range of motion.     Cervical back: Normal range of motion.  Skin:    General: Skin is warm and dry.     Capillary Refill: Capillary refill takes less than 2 seconds.  Neurological:     General: No focal deficit present.     Mental Status: He is alert.  Psychiatric:        Mood and Affect: Mood normal.     ED Results / Procedures / Treatments   Labs (all labs ordered are listed, but only abnormal results are displayed) Labs Reviewed  BASIC METABOLIC PANEL - Abnormal; Notable for the following components:      Result Value   Sodium 130 (*)    Potassium 6.0 (*)    Chloride 97 (*)    CO2 <7 (*)    Glucose, Bld 848 (*)    BUN 34 (*)     Creatinine, Ser 2.15 (*)    Calcium 8.4 (*)    GFR, Estimated 39 (*)    All other components within normal limits  CBC WITH DIFFERENTIAL/PLATELET - Abnormal; Notable for the following components:   WBC 22.9 (*)    Neutro Abs 18.9 (*)    Monocytes Absolute 1.8 (*)    Abs Immature Granulocytes 0.43 (*)    All other components within normal limits  URINALYSIS, ROUTINE W REFLEX MICROSCOPIC - Abnormal; Notable for the following components:   Color, Urine STRAW (*)    Glucose, UA >=500 (*)    Hgb urine dipstick MODERATE (*)    Ketones, ur 80 (*)    Protein, ur 100 (*)    Bacteria, UA RARE (*)    All other components within normal limits  BLOOD GAS, VENOUS - Abnormal; Notable for the following components:   pH, Ven <6.95 (*)    pCO2, Ven 21 (*)    Bicarbonate 4.3 (*)    Acid-base deficit 27.3 (*)    All other components within normal limits  CBG MONITORING, ED - Abnormal; Notable for the following components:   Glucose-Capillary >600 (*)    All other components within normal limits  CBG MONITORING, ED - Abnormal; Notable for the following components:   Glucose-Capillary >600 (*)    All other components within normal limits  CBG MONITORING, ED - Abnormal; Notable for the following components:   Glucose-Capillary >600 (*)    All other components within normal limits  BASIC METABOLIC PANEL  BASIC METABOLIC PANEL  BASIC METABOLIC PANEL  BETA-HYDROXYBUTYRIC ACID  BETA-HYDROXYBUTYRIC ACID    EKG EKG Interpretation  Date/Time:  Wednesday May 24 2022 11:28:40 EDT Ventricular Rate:  132 PR Interval:  120 QRS Duration: 93 QT Interval:  303 QTC Calculation: 449 R Axis:   85 Text Interpretation: Sinus tachycardia Probable left atrial enlargement Probable left ventricular hypertrophy Nonspecific T abnormalities, inferior leads Confirmed by Pattricia Boss [phone]) on 05/24/2022 1:23:33 PM  Radiology No results found.  Procedures Procedures    Medications Ordered in  ED Medications  insulin regular, human (MYXREDLIN) 100 units/ 100 mL infusion (10.5 Units/hr Intravenous New Bag/Given 05/24/22 1218)  lactated ringers infusion (has no administration in time range)  dextrose 5 % in lactated ringers infusion (has no administration in time range)  dextrose 50 % solution 0-50 mL (has no administration in  time range)  lactated ringers bolus 1,452 mL (1,452 mLs Intravenous New Bag/Given 05/24/22 1148)    ED Course/ Medical Decision Making/ A&P Clinical Course as of 05/24/22 1328  Wed May 24, 2022  1319 VBG pH less than 6.95, pCO2 21 Sodium 130, potassium 6  CO2 less than 6, glucose 848 consistent with DKA Creatinine elevated at 2.15 [DR]  1319 CBC reviewed interpreted leukocytosis 22,900 [DR]    Clinical Course User Index [DR] Pattricia Boss, MD                           Medical Decision Making 41 yo male ho dm presents with DKA likely secondary to noncompliance with medications although patient also reports nausea and vomiting.  Patient was not evaluated for other instigating factors including urinalysis, EKG, chest x-Kirin Pastorino. PH >6.9 BS 848 Treatment and evaluation initiated per protocol with iv fluid bolus approximately 1500 cc and endo tool initiation. Initial potassium noted to be elevated at 6 Acute kidney injury with creatinine 2.15 Plan consult to critical care for admission for ongoing evaluation and treatment Discussed with Marni Griffon, NP on for critical care and they will see for evaluation   Amount and/or Complexity of Data Reviewed External Data Reviewed: ECG. Labs: ordered. Decision-making details documented in ED Course. Radiology: ordered.  Risk Prescription drug management. Parenteral controlled substances. Decision regarding hospitalization.           Final Clinical Impression(s) / ED Diagnoses Final diagnoses:  Diabetic ketoacidosis without coma associated with type 1 diabetes mellitus ([place])  AKI (acute kidney injury)  [facility])    Rx / DC Orders ED Discharge Orders     None         Pattricia Boss, MD 05/24/22 1328

## 2022-05-24 NOTE — H&P (Signed)
NAME:  Edward Walls, MRN:  [id number], DOB:  04-Apr-1981, LOS: 0 ADMISSION DATE:  05/24/2022, CONSULTATION DATE:  05/24/22 REFERRING MD:  Ray-EDP, CHIEF COMPLAINT:  DKA   History of Present Illness:  [name] is a 41 year old gentleman with a history of insulin-dependent diabetes since age 75 who presented with several days of shortness of breath, nausea, vomiting, and general malaise.  He called his mom today and stated that he was having another " episode".  She called EMS to pick him up.  He was able to walk to the door to let them in.  He regularly runs blood sugars in the 300-500 range.  He reports compliance with his recent insulin regimen, Lantus 34 units daily and short acting insulin 20 units 3 times daily.  He does not use sliding scale insulin.  His main complaint currently is his mouth being dry and wanting ice chips.  He denies nausea, abdominal pain, chest pain, wounds, cough, sputum production, urinary symptoms.  Pertinent  Medical History  DM Neuropathic pain in feet  Significant Hospital Events: Including procedures, antibiotic start and stop dates in addition to other pertinent events   8/9 admission  Interim History / Subjective:    Objective   Blood pressure (!) 142/91, pulse (!) 126, temperature (!) 97.5 F (36.4 C), temperature source Oral, resp. rate (!) 23, height '6\' 3"'  (1.905 m), weight 72.6 kg, SpO2 99 %.       No intake or output data in the 24 hours ending 05/24/22 1336 Filed Weights   05/24/22 1130  Weight: 72.6 kg    Examination: General: Ill-appearing man sitting up in bed no acute distress HENT: [place]/AT, eyes anicteric, oral mucosa dry Lungs: Tachypnea, Kussmaul respirations.  Clear to auscultation bilaterally. Cardiovascular: S1-S2, tachycardic, regular rhythm Abdomen: Thin, soft, nontender Extremities: No foot wounds, no edema Neuro: Sleepy but arousable, moving all extremities Derm: No wounds, no rashes  Resolved Hospital Problem list      Assessment & Plan:  DKA, severe Uncontrolled diabetes Hyperkalemia  - Insulin drip - Additional 2 L volume resuscitation - Maintenance fluids per protocol - Every 6 hours BMPs - No treatment for hyperkalemia other than volume resuscitation and insulin at this time.  Anticipate he will require repletion over the next 24 hours. -Needs ongoing education regarding the risks for long-term complications with uncontrolled diabetes. - Check A1c  Pseudohyponatremia -Correct blood glucose  AKI -Volume repletion - Strict I's/O - Renally dose meds and avoid nephrotoxic meds - Continue to monitor  Leukocytosis - Empiric ceftriaxone - Check UA, chest x-ray  Neuropathic pain, chronic - Decrease gabapentin to 100 mg twice daily, can increase as renal function improves.  Patient's mother at bedside during admission, both were updated.   Best Practice (right click and "Reselect all SmartList Selections" daily)   Diet/type: NPO w/ oral meds, sips & chips DVT prophylaxis: prophylactic heparin  GI prophylaxis: N/A Lines: N/A Foley:  N/A Code Status:  full code Last date of multidisciplinary goals of care discussion [mother updated at bedside 8/9]  Labs   CBC: Recent Labs  Lab 05/24/22 1135  WBC 22.9*  NEUTROABS 18.9*  HGB 15.0  HCT 48.1  MCV 90.4  PLT 427    Basic Metabolic Panel: Recent Labs  Lab 05/24/22 1135  NA 130*  K 6.0*  CL 97*  CO2 <7*  GLUCOSE 848*  BUN 34*  CREATININE 2.15*  CALCIUM 8.4*   GFR: Estimated Creatinine Clearance: 47.4 mL/min (A) (by C-G formula  based on SCr of 2.15 mg/dL (H)). Recent Labs  Lab 05/24/22 1135  WBC 22.9*    Liver Function Tests: No results for input(s): "AST", "ALT", "ALKPHOS", "BILITOT", "PROT", "ALBUMIN" in the last 168 hours. No results for input(s): "LIPASE", "AMYLASE" in the last 168 hours. No results for input(s): "AMMONIA" in the last 168 hours.  ABG    Component Value Date/Time   PHART 7.337 (L)  06/05/2020 0742   PCO2ART 30.3 (L) 06/05/2020 0742   PO2ART 92.5 06/05/2020 0742   HCO3 4.3 (L) 05/24/2022 1135   TCO2 7 (L) 10/14/2018 0926   ACIDBASEDEF 27.3 (H) 05/24/2022 1135   O2SAT 70.9 05/24/2022 1135     Coagulation Profile: No results for input(s): "INR", "PROTIME" in the last 168 hours.  Cardiac Enzymes: No results for input(s): "CKTOTAL", "CKMB", "CKMBINDEX", "TROPONINI" in the last 168 hours.  HbA1C: HbA1c POC (<> result, manual entry)  Date/Time Value Ref Range Status  02/20/2022 10:45 AM >15 4.0 - 5.6 % Final  09/29/2021 08:51 AM >15 4.0 - 5.6 % Final    CBG: Recent Labs  Lab 05/24/22 1121 05/24/22 1207 05/24/22 1246  GLUCAP >600* >600* >600*    Review of Systems:   Review of Systems  Constitutional:  Negative for chills and fever.  HENT: Negative.    Respiratory:  Negative for cough and wheezing.   Cardiovascular:  Negative for chest pain and leg swelling.  Gastrointestinal:  Positive for nausea. Negative for vomiting.  Genitourinary:  Negative for dysuria and frequency.  Musculoskeletal:  Negative for joint pain and myalgias.  Skin:  Negative for rash.     Past Medical History:  He,  has a past medical history of Diabetes mellitus without complication ([place]), Leg pain, Leg weakness, and Seasonal allergies.   Surgical History:   Past Surgical History:  Procedure Laterality Date   DENTAL SURGERY       Social History:   reports that he has quit smoking. His smoking use included cigarettes. He has never used smokeless tobacco. He reports current alcohol use. He reports that he does not use drugs.   Family History:  His family history includes Diabetes Mellitus II in his brother, father, and mother.   Allergies Allergies  Allergen Reactions   Peanut-Containing Drug Products Other (See Comments)    Throat burning      Home Medications  Prior to Admission medications   Medication Sig Start Date End Date Taking? Authorizing Provider   Continuous Blood Gluc Sensor (FREESTYLE LIBRE 2 SENSOR) MISC CHANGE SENSOR EVERY 14 DAYS 07/22/21   [name], MD  Continuous Blood Gluc Transmit (DEXCOM G6 TRANSMITTER) MISC 1 Device by Does not apply route every 3 (three) months. 02/20/22   Philemon Kingdom, MD  Continuous Blood Gluc Transmit (DEXCOM G6 TRANSMITTER) MISC 1 Device by Does not apply route every 3 (three) months. 02/20/22   Philemon Kingdom, MD  DULoxetine (CYMBALTA) 60 MG capsule Take 1 capsule (60 mg total) by mouth daily. 06/21/21   Marcial Pacas, MD  gabapentin (NEURONTIN) 300 MG capsule Take 2 capsules (600 mg total) by mouth 3 (three) times daily. 03/30/22   Marcial Pacas, MD  Insulin Disposable Pump (OMNIPOD 5 G6 INTRO, GEN 5,) KIT 1 each by Does not apply route as needed. 02/20/22   Philemon Kingdom, MD  Insulin Disposable Pump (OMNIPOD 5 G6 POD, GEN 5,) MISC 1 each by Does not apply route every 3 (three) days. 02/20/22   Philemon Kingdom, MD  insulin glargine (LANTUS SOLOSTAR)  100 UNIT/ML Solostar Pen Inject 34 Units into the skin daily. 02/21/22   Philemon Kingdom, MD  Insulin Lispro-aabc (LYUMJEV KWIKPEN) 100 UNIT/ML KwikPen INJECT 12 TO 14 UNITS UNDER THE SKIN THREE TIMES A DAY BEFORE MEALS - NEED APPOINTMENT FOR ADDITIONAL REFILLS 02/16/22   Philemon Kingdom, MD  Insulin Pen Needle 32G X 4 MM MISC Use 4-5x a day 03/23/20   Philemon Kingdom, MD  metoCLOPramide (REGLAN) 10 MG tablet Take 1 tablet (10 mg total) by mouth every 6 (six) hours. 11/22/21   Tedd Sias, PA     Critical care time: 45 min.    Julian Hy, DO 05/24/22 2:03 PM [facility]

## 2022-05-24 NOTE — Progress Notes (Signed)
eLink Physician-Brief Progress Note Patient Name: Edward Walls DOB: Dec 22, 1980 MRN: [id number]   Date of Service  05/24/2022  HPI/Events of Note  Nursing reports that patient is still nauseated. QTc now = 0.56 seconds ??  eICU Interventions  Plan: D/C Zofran. Compazine 10 mg IV Q 6 hours PRN N/V.     Intervention Category Major Interventions: Other:  Lenell Antu 05/24/2022, 11:48 PM

## 2022-05-24 NOTE — ED Triage Notes (Signed)
Ems brings pt in from home for high blood sugar. Pt reports nausea and vomiting. Pt also states his eyes are sensitive to light.

## 2022-05-25 DIAGNOSIS — E1010 Type 1 diabetes mellitus with ketoacidosis without coma: Secondary | ICD-10-CM | POA: Diagnosis not present

## 2022-05-25 DIAGNOSIS — M792 Neuralgia and neuritis, unspecified: Secondary | ICD-10-CM | POA: Diagnosis not present

## 2022-05-25 DIAGNOSIS — N179 Acute kidney failure, unspecified: Secondary | ICD-10-CM

## 2022-05-25 DIAGNOSIS — E1065 Type 1 diabetes mellitus with hyperglycemia: Secondary | ICD-10-CM | POA: Diagnosis not present

## 2022-05-25 LAB — BASIC METABOLIC PANEL
Anion gap: 9 (ref 5–15)
Anion gap: 7 (ref 5–15)
Anion gap: 6 (ref 5–15)
BUN: 23 mg/dL — ABNORMAL HIGH (ref 6–20)
BUN: 20 mg/dL (ref 6–20)
BUN: 17 mg/dL (ref 6–20)
CO2: 21 mmol/L — ABNORMAL LOW (ref 22–32)
CO2: 23 mmol/L (ref 22–32)
CO2: 24 mmol/L (ref 22–32)
Calcium: 9.2 mg/dL (ref 8.9–10.3)
Calcium: 9.1 mg/dL (ref 8.9–10.3)
Calcium: 8.6 mg/dL — ABNORMAL LOW (ref 8.9–10.3)
Chloride: 109 mmol/L (ref 98–111)
Chloride: 110 mmol/L (ref 98–111)
Chloride: 108 mmol/L (ref 98–111)
Creatinine, Ser: 0.86 mg/dL (ref 0.61–1.24)
Creatinine, Ser: 0.83 mg/dL (ref 0.61–1.24)
Creatinine, Ser: 0.84 mg/dL (ref 0.61–1.24)
GFR, Estimated: >60 mL/min (ref >60)
GFR, Estimated: >60 mL/min (ref >60)
GFR, Estimated: >60 mL/min (ref >60)
Glucose, Bld: 156 mg/dL — ABNORMAL HIGH (ref 70–99)
Glucose, Bld: 136 mg/dL — ABNORMAL HIGH (ref 70–99)
Glucose, Bld: 278 mg/dL — ABNORMAL HIGH (ref 70–99)
Potassium: 3.8 mmol/L (ref 3.5–5.1)
Potassium: 3.5 mmol/L (ref 3.5–5.1)
Potassium: 3.5 mmol/L (ref 3.5–5.1)
Sodium: 139 mmol/L (ref 135–145)
Sodium: 140 mmol/L (ref 135–145)
Sodium: 138 mmol/L (ref 135–145)

## 2022-05-25 LAB — GLUCOSE, CAPILLARY
Glucose-Capillary: 141 mg/dL — ABNORMAL HIGH (ref 70–99)
Glucose-Capillary: 141 mg/dL — ABNORMAL HIGH (ref 70–99)
Glucose-Capillary: 147 mg/dL — ABNORMAL HIGH (ref 70–99)
Glucose-Capillary: 151 mg/dL — ABNORMAL HIGH (ref 70–99)
Glucose-Capillary: 152 mg/dL — ABNORMAL HIGH (ref 70–99)
Glucose-Capillary: 155 mg/dL — ABNORMAL HIGH (ref 70–99)
Glucose-Capillary: 157 mg/dL — ABNORMAL HIGH (ref 70–99)
Glucose-Capillary: 158 mg/dL — ABNORMAL HIGH (ref 70–99)
Glucose-Capillary: 159 mg/dL — ABNORMAL HIGH (ref 70–99)
Glucose-Capillary: 163 mg/dL — ABNORMAL HIGH (ref 70–99)
Glucose-Capillary: 170 mg/dL — ABNORMAL HIGH (ref 70–99)
Glucose-Capillary: 171 mg/dL — ABNORMAL HIGH (ref 70–99)
Glucose-Capillary: 172 mg/dL — ABNORMAL HIGH (ref 70–99)
Glucose-Capillary: 181 mg/dL — ABNORMAL HIGH (ref 70–99)
Glucose-Capillary: 182 mg/dL — ABNORMAL HIGH (ref 70–99)
Glucose-Capillary: 198 mg/dL — ABNORMAL HIGH (ref 70–99)
Glucose-Capillary: 200 mg/dL — ABNORMAL HIGH (ref 70–99)
Glucose-Capillary: 204 mg/dL — ABNORMAL HIGH (ref 70–99)
Glucose-Capillary: 210 mg/dL — ABNORMAL HIGH (ref 70–99)
Glucose-Capillary: 210 mg/dL — ABNORMAL HIGH (ref 70–99)
Glucose-Capillary: 250 mg/dL — ABNORMAL HIGH (ref 70–99)

## 2022-05-25 LAB — CBC
HCT: 41.3 % (ref 39.0–52.0)
Hemoglobin: 14.2 g/dL (ref 13.0–17.0)
MCH: 28.3 pg (ref 26.0–34.0)
MCHC: 34.4 g/dL (ref 30.0–36.0)
MCV: 82.4 fL (ref 80.0–100.0)
Platelets: 202 10*3/uL (ref 150–400)
RBC: 5.01 MIL/uL (ref 4.22–5.81)
RDW: 11.7 % (ref 11.5–15.5)
WBC: 12.9 10*3/uL — ABNORMAL HIGH (ref 4.0–10.5)
nRBC: 0 % (ref 0.0–0.2)

## 2022-05-25 LAB — BETA-HYDROXYBUTYRIC ACID
Beta-Hydroxybutyric Acid: 1.3 mmol/L — ABNORMAL HIGH (ref 0.05–0.27)
Beta-Hydroxybutyric Acid: 0.9 mmol/L — ABNORMAL HIGH (ref 0.05–0.27)
Beta-Hydroxybutyric Acid: 0.56 mmol/L — ABNORMAL HIGH (ref 0.05–0.27)

## 2022-05-25 LAB — MAGNESIUM: Magnesium: 2.2 mg/dL (ref 1.7–2.4)

## 2022-05-25 LAB — PHOSPHORUS: Phosphorus: 1.2 mg/dL — ABNORMAL LOW (ref 2.5–4.6)

## 2022-05-25 MED ORDER — GABAPENTIN 300 MG PO CAPS
300.0000 mg | ORAL_CAPSULE | Freq: Three times a day (TID) | ORAL | Status: DC
  Administered 2022-05-25 – 2022-05-26 (×4): 300 mg via ORAL
  Filled 2022-05-25 (×4): qty 1

## 2022-05-25 MED ORDER — GABAPENTIN 100 MG PO CAPS: 100.0000 mg | ORAL_CAPSULE | Freq: Two times a day (BID) | ORAL | Status: DC | PRN

## 2022-05-25 MED ORDER — MAGNESIUM SULFATE IN D5W 1-5 GM/100ML-% IV SOLN
1.0000 g | Freq: Once | INTRAVENOUS | Status: AC
  Administered 2022-05-25: 1 g via INTRAVENOUS
  Filled 2022-05-25: qty 100

## 2022-05-25 MED ORDER — POTASSIUM CHLORIDE CRYS ER 20 MEQ PO TBCR
40.0000 meq | EXTENDED_RELEASE_TABLET | Freq: Once | ORAL | Status: AC
  Administered 2022-05-25: 40 meq via ORAL
  Filled 2022-05-25: qty 2

## 2022-05-25 MED ORDER — POTASSIUM CHLORIDE CRYS ER 20 MEQ PO TBCR
60.0000 meq | EXTENDED_RELEASE_TABLET | Freq: Once | ORAL | Status: AC
  Administered 2022-05-25: 60 meq via ORAL
  Filled 2022-05-25: qty 3

## 2022-05-25 MED ORDER — POTASSIUM & SODIUM PHOSPHATES 280-160-250 MG PO PACK
2.0000 | PACK | Freq: Two times a day (BID) | ORAL | Status: AC
Start: 2022-05-25 — End: 2022-05-25
  Administered 2022-05-25 (×2): 2 via ORAL
  Filled 2022-05-25 (×2): qty 2

## 2022-05-25 MED ORDER — POTASSIUM PHOSPHATES 15 MMOLE/5ML IV SOLN
15.0000 mmol | Freq: Once | INTRAVENOUS | Status: AC
  Administered 2022-05-25: 15 mmol via INTRAVENOUS
  Filled 2022-05-25: qty 5

## 2022-05-25 NOTE — TOC Initial Note (Signed)
Transition of Care [facility] - Initial/Assessment Note    Patient Details  Name: Edward Walls MRN: [id number] Date of Birth: 1981-01-24  Transition of Care [facility]) CM/SW Contact:    Golda Acre, RN Phone Number: 05/25/2022, 8:54 AM  Clinical Narrative:                  Transition of Care [facility]) Screening Note   Patient Details  Name: Edward Walls Date of Birth: 12-10-1980   Transition of Care [facility]) CM/SW Contact:    Golda Acre, RN Phone Number: 05/25/2022, 8:54 AM    Transition of Care Department [facility]) has reviewed patient and no TOC needs have been identified at this time. We will continue to monitor patient advancement through interdisciplinary progression rounds. If new patient transition needs arise, please place a TOC consult.    Expected Discharge Plan: Home/Self Care Barriers to Discharge: Continued Medical Work up   Patient Goals and CMS Choice Patient states their goals for this hospitalization and ongoing recovery are:: patient wants to go home CMS Medicare.gov Compare Post Acute Care list provided to:: Patient Choice offered to / list presented to : Patient  Expected Discharge Plan and Services Expected Discharge Plan: Home/Self Care   Discharge Planning Services: CM Consult   Living arrangements for the past 2 months: Apartment                                      Prior Living Arrangements/Services Living arrangements for the past 2 months: Apartment Lives with:: Self Patient language and need for interpreter reviewed:: Yes Do you feel safe going back to the place where you live?: Yes            Criminal Activity/Legal Involvement Pertinent to Current Situation/Hospitalization: No - Comment as needed  Activities of Daily Living Home Assistive Devices/Equipment: None ADL Screening (condition at time of admission) Patient's cognitive ability adequate to safely complete daily activities?: Yes Is the patient deaf or have  difficulty hearing?: No Does the patient have difficulty seeing, even when wearing glasses/contacts?: No Does the patient have difficulty concentrating, remembering, or making decisions?: No Patient able to express need for assistance with ADLs?: Yes Does the patient have difficulty dressing or bathing?: Yes Independently performs ADLs?: No Communication: Independent Dressing (OT): Needs assistance Is this a change from baseline?: Change from baseline, expected to last <3days Grooming: Independent Feeding: Independent Bathing: Needs assistance Is this a change from baseline?: Change from baseline, expected to last <3 days Toileting: Needs assistance Is this a change from baseline?: Change from baseline, expected to last <3 days In/Out Bed: Needs assistance Is this a change from baseline?: Change from baseline, expected to last <3 days Walks in Home: Needs assistance Is this a change from baseline?: Change from baseline, expected to last <3 days Does the patient have difficulty walking or climbing stairs?: Yes (with this illness) Weakness of Legs: Both Weakness of Arms/Hands: Both  Permission Sought/Granted                  Emotional Assessment Appearance:: Appears stated age Attitude/Demeanor/Rapport: Engaged Affect (typically observed): Calm Orientation: : Oriented to Self, Oriented to Place, Oriented to  Time, Oriented to Situation Alcohol / Substance Use: Alcohol Use, Tobacco Use (tobacco use in the past not present) Psych Involvement: No (comment)  Admission diagnosis:  DKA (diabetic ketoacidosis) (HCC) [E11.10] AKI (acute kidney injury) (HCC) [  N17.9] Diabetic ketoacidosis without coma associated with type 1 diabetes mellitus (HCC) [E10.10] Patient Active Problem List   Diagnosis Date Noted   DKA (diabetic ketoacidosis) (HCC) 05/24/2022   Paresthesia 12/14/2020   Diabetic neuropathy (HCC) 12/14/2020   Neuropathic pain 12/14/2020   Hypokalemia 06/06/2020    Hypophosphatemia 06/06/2020   Hypomagnesemia 06/06/2020   Hypocalcemia 06/06/2020   Dyslipidemia 03/23/2020   Diabetes mellitus type 1.5, 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 [address] Phone: [phone] Fax: [phone]  [facility] - [place], [place] - [address] [facility] [address] [place] [address] Phone: [phone] Fax: [phone]  [id number] [place], [place] - [address] AT [facility] [address] [place] [address] Phone: [phone] Fax: [phone]     Social Determinants of Health (SDOH) Interventions    Readmission Risk Interventions     No data to display

## 2022-05-25 NOTE — Progress Notes (Signed)
eLink Physician-Brief Progress Note Patient Name: WHITMAN MEINHARDT DOB: 25-Aug-1981 MRN: [id number]   Date of Service  05/25/2022  HPI/Events of Note  Hypomagnesemia - Mg++ = 1.9.  eICU Interventions  Will replace Mg++.     Intervention Category Major Interventions: Electrolyte abnormality - evaluation and management  Kilyn Maragh Eugene 05/25/2022, 12:17 AM

## 2022-05-25 NOTE — Progress Notes (Signed)
NAME:  Edward Walls, MRN:  [id number], DOB:  January 16, 1981, LOS: 1 ADMISSION DATE:  05/24/2022, CONSULTATION DATE:  05/24/22 REFERRING MD:  Ray-EDP, CHIEF COMPLAINT:  DKA   History of Present Illness:  Mr. Orville Govern is a 41 year old gentleman with a history of insulin-dependent diabetes since age 71 who presented with several days of shortness of breath, nausea, vomiting, and general malaise.  He called his mom today and stated that he was having another " episode".  She called EMS to pick him up.  He was able to walk to the door to let them in.  He regularly runs blood sugars in the 300-500 range.  He reports compliance with his recent insulin regimen, Lantus 34 units daily and short acting insulin 20 units 3 times daily.  He does not use sliding scale insulin.  His main complaint currently is his mouth being dry and wanting ice chips.  He denies nausea, abdominal pain, chest pain, wounds, cough, sputum production, urinary symptoms.  Pertinent  Medical History  DM Neuropathic pain in feet  Significant Hospital Events: Including procedures, antibiotic start and stop dates in addition to other pertinent events   8/9 admission, insulin gtt, started on empiric ceftriaxone  Interim History / Subjective:  No new complaints.   Objective   Blood pressure (!) 146/90, pulse (!) 108, temperature 98.5 F (36.9 C), temperature source Axillary, resp. rate 14, height 6\' 3"  (1.905 m), weight 68 kg, SpO2 99 %.        Intake/Output Summary (Last 24 hours) at 05/25/2022 0749 Last data filed at 05/25/2022 0700 Gross per 24 hour  Intake 5283.19 ml  Output 1750 ml  Net 3533.19 ml   Filed Weights   05/24/22 1130 05/24/22 1514 05/25/22 0310  Weight: 72.6 kg 66.6 kg 68 kg    Examination: General: fatigued appearing young man lying in bed in NAD HENT: [place]/AT Lungs: breathing comfortably on RA, CTAB Cardiovascular: S1S2, RRR Abdomen: thin, soft, NT Extremities: no peripheral edema, no cyanosis Neuro:  awake, alert, normal speech, answering questions appropriately Derm: no wounds or rashes  K+ 3.8 Phos 1.2 Mg 2.2 Bicarb 21 Cr 0.86  Resolved Hospital Problem list   Pseudohyponatremia  Assessment & Plan:  DKA, severe Uncontrolled diabetes Hyperkalemia  - con't insulin gtt until out of DKA - q6h BMP, electrolyte repletion - con't maintenance fluids, advance diet -diabetes education ordered; he has been counseled that he is likely to have long-term complications such as blindness, ESRD, CVA, MI from his poorly controlled diabetes long-term  - Check A1c -needs OP follow up with his endocrinologist  AKI-prerenal, resolved -monitor -strict I/Os  Leukocytosis, improving - ok to stop empiric ceftriaxone  Neuropathic pain, chronic - increase gabapentin back to PTA dose  Stable to transfer to the floor today. Anticipate he can go home tomorrow.   Best Practice (right click and "Reselect all SmartList Selections" daily)   Diet/type: Regular consistency (see orders) DVT prophylaxis: prophylactic heparin  GI prophylaxis: PPI Lines: N/A Foley:  N/A Code Status:  full code Last date of multidisciplinary goals of care discussion [mother updated at bedside 8/9]  Labs   CBC: Recent Labs  Lab 05/24/22 1135 05/24/22 1810 05/25/22 0606  WBC 22.9* 20.2* 12.9*  NEUTROABS 18.9*  --   --   HGB 15.0 15.9 14.2  HCT 48.1 47.3 41.3  MCV 90.4 85.7 82.4  PLT 318 264 202     Basic Metabolic Panel: Recent Labs  Lab 05/24/22 1135 05/24/22 1353 05/24/22 1810  05/24/22 2249 05/25/22 0606  NA 130* 138 140 140 139  K 6.0* 4.2 5.0 4.0 3.8  CL 97* 111 108 113* 109  CO2 <7* <7* 9* 16* 21*  GLUCOSE 848* 517* 347* 222* 156*  BUN 34* 31* 30* 26* 23*  CREATININE 2.15* 1.65* 1.59* 0.95 0.86  CALCIUM 8.4* 7.7* 9.3 8.9 9.2  MG  --   --   --  1.9 2.2  PHOS  --   --   --   --  1.2*    GFR: Estimated Creatinine Clearance: 110.9 mL/min (by C-G formula based on SCr of 0.86  mg/dL). Recent Labs  Lab 05/24/22 1135 05/24/22 1810 05/25/22 0606  WBC 22.9* 20.2* 12.9*       Critical care time:      Steffanie Dunn, DO 05/25/22 9:22 AM [facility]

## 2022-05-25 NOTE — Inpatient Diabetes Management (Signed)
Inpatient Diabetes Program Recommendations  AACE/ADA: New Consensus Statement on Inpatient Glycemic Control (2015)  Target Ranges:  Prepandial:   less than 140 mg/dL      Peak postprandial:   less than 180 mg/dL (1-2 hours)      Critically ill patients:  140 - 180 mg/dL   Lab Results  Component Value Date   GLUCAP 159 (H) 05/25/2022   HGBA1C >15 02/20/2022    Review of Glycemic Control  Diabetes history: DM1, diagnosed in 2017 Outpatient Diabetes medications: Tresiba 30 units QD, Lyumjev 20 units TID (admits to not taking) Current orders for Inpatient glycemic control: IV insulin per EndoTool for DKA  HgbA1C - > 15% Endo - [place], last appt 02/20/22  Inpatient Diabetes Program Recommendations:    When criteria met for discontinuation of drip, give Semglee 1-2 hours prior to discontinuation of drip.  Semglee 25 units QD Novolog 0-9 units TID with meals and 0-5 HS Novolog 8 units TID with meals if eating > 50%  Long conversation regarding pt's diabetes control and HgbA1C of > 15%. Pt states he doesn't take his rapid-acting insulin on a regular basis. States he does take Basaglar 30 units QD. Has a lot of stress with work and relationships. Discussed how stress with increase blood sugars too. Discussed complications from uncontrolled diabetes and talked about importance of knowing what blood sugar is, monitoring more frequently. Pt has OmniPod and Dexcom at home, although hasn't made appt to have it set up. Endo notes state to call office when pt has pump and they will set time up for education and setting up pump. Pt states he understood that his insurance would not pay for this appt and it was approx $400. Pt said he likely could set the pump up himself. Explained importance of going to Endo office and seeing pump trainer. Pt very appreciative of visit and said he wants to control blood sugars to feel better.  Will f/u in am.  Thank you. Lorenda Peck, RD, LDN, [facility]  Diabetes Coordinator [phone]

## 2022-05-25 NOTE — Progress Notes (Signed)
eLink Physician-Brief Progress Note Patient Name: ADISON JERGER DOB: 08-Jul-1981 MRN: [id number]   Date of Service  05/25/2022  HPI/Events of Note  Hiccups - Already on Compazine IV PRN N/V/  eICU Interventions  Plan: Gabapentin 100 mg PO Q 12 hours PRN hiccups. Can use already ordered Compazine IV PRN if needed.      Intervention Category Major Interventions: Other:  Lenell Antu 05/25/2022, 9:33 PM

## 2022-05-26 DIAGNOSIS — E1010 Type 1 diabetes mellitus with ketoacidosis without coma: Secondary | ICD-10-CM | POA: Diagnosis not present

## 2022-05-26 LAB — COMPREHENSIVE METABOLIC PANEL
ALT: 29 U/L (ref 0–44)
AST: 29 U/L (ref 15–41)
Albumin: 3.2 g/dL — ABNORMAL LOW (ref 3.5–5.0)
Alkaline Phosphatase: 50 U/L (ref 38–126)
Anion gap: 5 (ref 5–15)
BUN: 12 mg/dL (ref 6–20)
CO2: 22 mmol/L (ref 22–32)
Calcium: 8.6 mg/dL — ABNORMAL LOW (ref 8.9–10.3)
Chloride: 110 mmol/L (ref 98–111)
Creatinine, Ser: 0.71 mg/dL (ref 0.61–1.24)
GFR, Estimated: >60 mL/min (ref >60)
Glucose, Bld: 155 mg/dL — ABNORMAL HIGH (ref 70–99)
Potassium: 3.1 mmol/L — ABNORMAL LOW (ref 3.5–5.1)
Sodium: 137 mmol/L (ref 135–145)
Total Bilirubin: 0.9 mg/dL (ref 0.3–1.2)
Total Protein: 5.6 g/dL — ABNORMAL LOW (ref 6.5–8.1)

## 2022-05-26 LAB — CBC WITH DIFFERENTIAL/PLATELET
Abs Immature Granulocytes: 0.02 10*3/uL (ref 0.00–0.07)
Basophils Absolute: 0 10*3/uL (ref 0.0–0.1)
Basophils Relative: 0 %
Eosinophils Absolute: 0 10*3/uL (ref 0.0–0.5)
Eosinophils Relative: 0 %
HCT: 36.5 % — ABNORMAL LOW (ref 39.0–52.0)
Hemoglobin: 12.7 g/dL — ABNORMAL LOW (ref 13.0–17.0)
Immature Granulocytes: 0 %
Lymphocytes Relative: 24 %
Lymphs Abs: 1.6 10*3/uL (ref 0.7–4.0)
MCH: 28.5 pg (ref 26.0–34.0)
MCHC: 34.8 g/dL (ref 30.0–36.0)
MCV: 81.8 fL (ref 80.0–100.0)
Monocytes Absolute: 0.4 10*3/uL (ref 0.1–1.0)
Monocytes Relative: 6 %
Neutro Abs: 4.7 10*3/uL (ref 1.7–7.7)
Neutrophils Relative %: 70 %
Platelets: 184 10*3/uL (ref 150–400)
RBC: 4.46 MIL/uL (ref 4.22–5.81)
RDW: 11.6 % (ref 11.5–15.5)
WBC: 6.8 10*3/uL (ref 4.0–10.5)
nRBC: 0 % (ref 0.0–0.2)

## 2022-05-26 LAB — HEMOGLOBIN A1C
Hgb A1c MFr Bld: >15.5 % — ABNORMAL HIGH (ref 4.8–5.6)
Mean Plasma Glucose: >398 mg/dL

## 2022-05-26 LAB — BASIC METABOLIC PANEL
Anion gap: 6 (ref 5–15)
BUN: 14 mg/dL (ref 6–20)
CO2: 24 mmol/L (ref 22–32)
Calcium: 8.6 mg/dL — ABNORMAL LOW (ref 8.9–10.3)
Chloride: 111 mmol/L (ref 98–111)
Creatinine, Ser: 0.76 mg/dL (ref 0.61–1.24)
GFR, Estimated: >60 mL/min (ref >60)
Glucose, Bld: 140 mg/dL — ABNORMAL HIGH (ref 70–99)
Potassium: 2.8 mmol/L — ABNORMAL LOW (ref 3.5–5.1)
Sodium: 141 mmol/L (ref 135–145)

## 2022-05-26 LAB — BETA-HYDROXYBUTYRIC ACID: Beta-Hydroxybutyric Acid: 0.1 mmol/L (ref 0.05–0.27)

## 2022-05-26 LAB — MAGNESIUM
Magnesium: 1.7 mg/dL (ref 1.7–2.4)
Magnesium: 2.2 mg/dL (ref 1.7–2.4)

## 2022-05-26 LAB — GLUCOSE, CAPILLARY
Glucose-Capillary: 157 mg/dL — ABNORMAL HIGH (ref 70–99)
Glucose-Capillary: 146 mg/dL — ABNORMAL HIGH (ref 70–99)
Glucose-Capillary: 160 mg/dL — ABNORMAL HIGH (ref 70–99)
Glucose-Capillary: 137 mg/dL — ABNORMAL HIGH (ref 70–99)
Glucose-Capillary: 155 mg/dL — ABNORMAL HIGH (ref 70–99)
Glucose-Capillary: 168 mg/dL — ABNORMAL HIGH (ref 70–99)
Glucose-Capillary: 128 mg/dL — ABNORMAL HIGH (ref 70–99)
Glucose-Capillary: 139 mg/dL — ABNORMAL HIGH (ref 70–99)
Glucose-Capillary: 181 mg/dL — ABNORMAL HIGH (ref 70–99)
Glucose-Capillary: 177 mg/dL — ABNORMAL HIGH (ref 70–99)
Glucose-Capillary: 164 mg/dL — ABNORMAL HIGH (ref 70–99)
Glucose-Capillary: 164 mg/dL — ABNORMAL HIGH (ref 70–99)

## 2022-05-26 LAB — PHOSPHORUS
Phosphorus: 2.4 mg/dL — ABNORMAL LOW (ref 2.5–4.6)
Phosphorus: 2.1 mg/dL — ABNORMAL LOW (ref 2.5–4.6)

## 2022-05-26 MED ORDER — SODIUM CHLORIDE 0.9 % IV BOLUS
1000.0000 mL | Freq: Once | INTRAVENOUS | Status: AC
  Administered 2022-05-26: 1000 mL via INTRAVENOUS

## 2022-05-26 MED ORDER — INSULIN ASPART 100 UNIT/ML IJ SOLN
8.0000 [IU] | Freq: Three times a day (TID) | INTRAMUSCULAR | Status: DC
  Administered 2022-05-26: 8 [IU] via SUBCUTANEOUS

## 2022-05-26 MED ORDER — INSULIN GLARGINE-YFGN 100 UNIT/ML ~~LOC~~ SOLN
25.0000 [IU] | Freq: Every day | SUBCUTANEOUS | Status: DC
  Administered 2022-05-26: 25 [IU] via SUBCUTANEOUS
  Filled 2022-05-26: qty 0.25

## 2022-05-26 MED ORDER — POLYETHYLENE GLYCOL 3350 17 G PO PACK: 17.0000 g | PACK | Freq: Every day | ORAL | 0 refills | Status: DC | PRN

## 2022-05-26 MED ORDER — DOCUSATE SODIUM 100 MG PO CAPS
100.0000 mg | ORAL_CAPSULE | Freq: Two times a day (BID) | ORAL | 0 refills | Status: DC | PRN
Start: 2022-05-26 — End: 2022-12-29

## 2022-05-26 MED ORDER — INSULIN DEGLUDEC 100 UNIT/ML ~~LOC~~ SOPN: 34.0000 [IU] | PEN_INJECTOR | Freq: Every day | SUBCUTANEOUS | 0 refills | Status: DC

## 2022-05-26 MED ORDER — INSULIN ASPART 100 UNIT/ML IJ SOLN: 0.0000 [IU] | Freq: Every day | INTRAMUSCULAR | Status: DC

## 2022-05-26 MED ORDER — INSULIN ASPART 100 UNIT/ML IJ SOLN
0.0000 [IU] | Freq: Three times a day (TID) | INTRAMUSCULAR | Status: DC
  Administered 2022-05-26: 2 [IU] via SUBCUTANEOUS

## 2022-05-26 MED ORDER — LYUMJEV KWIKPEN 100 UNIT/ML ~~LOC~~ SOPN: PEN_INJECTOR | SUBCUTANEOUS | 0 refills | Status: DC

## 2022-05-26 MED ORDER — POTASSIUM CHLORIDE CRYS ER 20 MEQ PO TBCR
40.0000 meq | EXTENDED_RELEASE_TABLET | Freq: Two times a day (BID) | ORAL | Status: DC
Start: 2022-05-26 — End: 2022-05-26
  Administered 2022-05-26: 40 meq via ORAL
  Filled 2022-05-26: qty 2

## 2022-05-26 MED ORDER — INSULIN PEN NEEDLE 32G X 4 MM MISC: 3 refills | Status: AC

## 2022-05-26 MED ORDER — PANTOPRAZOLE SODIUM 40 MG PO TBEC: 40.0000 mg | DELAYED_RELEASE_TABLET | Freq: Every day | ORAL | 1 refills | Status: DC

## 2022-05-26 MED ORDER — POTASSIUM PHOSPHATES 15 MMOLE/5ML IV SOLN
15.0000 mmol | Freq: Once | INTRAVENOUS | Status: AC
  Administered 2022-05-26: 15 mmol via INTRAVENOUS
  Filled 2022-05-26: qty 5

## 2022-05-26 MED ORDER — K PHOS MONO-SOD PHOS DI & MONO 155-852-130 MG PO TABS
500.0000 mg | ORAL_TABLET | Freq: Two times a day (BID) | ORAL | Status: DC
  Administered 2022-05-26: 500 mg via ORAL
  Filled 2022-05-26 (×2): qty 2

## 2022-05-26 MED ORDER — MAGNESIUM SULFATE 2 GM/50ML IV SOLN
2.0000 g | Freq: Once | INTRAVENOUS | Status: AC
  Administered 2022-05-26: 2 g via INTRAVENOUS
  Filled 2022-05-26: qty 50

## 2022-05-26 NOTE — Plan of Care (Signed)

## 2022-05-26 NOTE — Discharge Summary (Incomplete)
Physician Discharge Summary   Patient: Edward Walls MRN: [id number] DOB: December 13, 1980  Admit date:     05/24/2022  Discharge date: 05/26/22  Discharge Physician: Raiford Noble, DO   PCP: [name], MD   Recommendations at discharge:   Follow-up with PCP within 1 to 2 weeks and repeat CBC, CMP, mag, Phos within 1 week Follow-up with endocrinology Dr. Cruzita Lederer within 1 to 2 weeks and remain compliant insulin regimen  Discharge Diagnoses: Principal Problem:   DKA (diabetic ketoacidosis) ([place])  Resolved Problems:   * No resolved hospital problems. [facility] Course: HPI per Dr. Noemi Chapel on 05/24/22 [name] is a 41 year old gentleman with a history of insulin-dependent diabetes since age 41 who presented with several days of shortness of breath, nausea, vomiting, and general malaise.  He called his mom today and stated that he was having another " episode".  She called EMS to pick him up.  He was able to walk to the door to let them in.  He regularly runs blood sugars in the 300-500 range.  He reports compliance with his recent insulin regimen, Lantus 34 units daily and short acting insulin 20 units 3 times daily.  He does not use sliding scale insulin.  His main complaint currently is his mouth being dry and wanting ice chips.  He denies nausea, abdominal pain, chest pain, wounds, cough, sputum production, urinary symptoms.  **Interim History His DKA improved significantly and his beta hydroxybutyrate acid normalized and his anion gap normalized.  He was transitioned to long-acting insulin and tolerated his meals well.  He was deemed medically stable to be discharged in the diabetes education coordinator discussed with him about recommendations and follow-up.  He is deemed stable for discharge and will need to follow-up with PCP as well as his endocrinologist in outpatient setting and he will be resumed on his home insulin regimen.  Assessment and Plan:  DKA,  severe Uncontrolled diabetes Hyperkalemia  - Continued insulin gtt until out of DKA and transitioned to long-acting - q6h BMP, electrolyte repletion - con't maintenance fluids, advance diet -diabetes education ordered; he has been counseled that he is likely to have long-term complications such as blindness, ESRD, CVA, MI from his poorly controlled diabetes long-term  - Check A1c and was greater than 15 -needs OP follow up with his endocrinologist and will follow up with Dr. Cruzita Lederer -CBGs ranging from 155-177 at the time of discharge   AKI-prerenal, resolved -monitor -strict I/Os   Leukocytosis -Resolved -IV ceftriaxone was discontinued -To monitor and trend and repeat CMP in the outpatient setting  Neuropathic pain, chronic - increase gabapentin back to PTA dose  Hypokalemia Hypophosphatemia -Patient's Phos level was 2.1 repleted prior to discharge and his potassium was 3.1 and replete prior to discharge as well -Continue to monitor and trend and repeat CMP in outpatient setting  Normocytic anemia -Likely dilutional drop in the setting of IV fluid resuscitation -Patient's hemoglobin/hematocrit went from 14.2/41.3 on admission and is now 12.7/36.5 -Check anemia panel in outpatient setting and continue to monitor for signs and symptoms bleeding; no overt bleeding noted -Repeat CBC within 1 week  Consultants: PCCM Transfer; Diabetes Education Coordinator  Procedures performed: None Disposition: HomeHome Diet recommendation:  Discharge Diet Orders (From admission, onward)     Start     Ordered   05/26/22 0000  Diet Carb Modified        05/26/22 1453           Carb modified  diet DISCHARGE MEDICATION: Allergies as of 05/26/2022       Reactions   Other Other (See Comments)   All nuts Throat pain   Peanut-containing Drug Products Other (See Comments)   Throat pain        Medication List     STOP taking these medications    DULoxetine 60 MG  capsule Commonly known as: CYMBALTA   Lantus SoloStar 100 UNIT/ML Solostar Pen Generic drug: insulin glargine   metoCLOPramide 10 MG tablet Commonly known as: REGLAN       TAKE these medications    Dexcom G6 Transmitter Misc 1 Device by Does not apply route every 3 (three) months.   Dexcom G6 Transmitter Misc 1 Device by Does not apply route every 3 (three) months.   docusate sodium 100 MG capsule Commonly known as: COLACE Take 1 capsule (100 mg total) by mouth 2 (two) times daily as needed for mild constipation.   FreeStyle Libre 2 Sensor Misc CHANGE SENSOR EVERY 14 DAYS   gabapentin 300 MG capsule Commonly known as: NEURONTIN Take 2 capsules (600 mg total) by mouth 3 (three) times daily.   insulin degludec 100 UNIT/ML FlexTouch Pen Commonly known as: TRESIBA Inject 34 Units into the skin daily.   Insulin Pen Needle 32G X 4 MM Misc Use 4-5x a day   Lyumjev KwikPen 100 UNIT/ML KwikPen Generic drug: Insulin Lispro-aabc INJECT 16 TO 18 UNITS UNDER THE SKIN THREE TIMES A DAY BEFORE MEALS based on Recommendations by Dr. Cruzita Lederer - NEED APPOINTMENT FOR ADDITIONAL REFILLS What changed: additional instructions   Omnipod 5 G6 Pod (Gen 5) Misc 1 each by Does not apply route every 3 (three) days.   Omnipod 5 G6 Intro (Gen 5) Kit 1 each by Does not apply route as needed.   pantoprazole 40 MG tablet Commonly known as: Protonix Take 1 tablet (40 mg total) by mouth daily.   polyethylene glycol 17 g packet Commonly known as: MIRALAX / GLYCOLAX Take 17 g by mouth daily as needed for moderate constipation.        Discharge Exam: Filed Weights   05/24/22 1514 05/25/22 0310 05/26/22 0500  Weight: 66.6 kg 68 kg 68 kg   Vitals:   05/26/22 0800 05/26/22 1158  BP: (!) 146/97   Pulse: 92   Resp:    Temp:  98.2 F (36.8 C)  SpO2: 100%    Examination: Physical Exam:  Constitutional: Thin African-American male currently no acute distress appears calm Respiratory:  Diminished to auscultation bilaterally, no wheezing, rales, rhonchi or crackles. Normal respiratory effort and patient is not tachypenic. No accessory muscle use.  Unlabored breathing Cardiovascular: RRR, no murmurs / rubs / gallops. S1 and S2 auscultated.  Abdomen: Soft, non-tender, non-distended. Bowel sounds positive.  GU: Deferred. Musculoskeletal: No clubbing / cyanosis of digits/nails. No joint deformity upper and lower extremities.  Skin: No rashes, lesions, ulcers on limited skin evaluation. No induration; Warm and dry.  Neurologic: CN 2-12 grossly intact with no focal deficits.Romberg sign and cerebellar reflexes not assessed.  Psychiatric: Normal judgment and insight. Alert and oriented x 3. Normal mood and appropriate affect.   Condition at discharge: stable  The results of significant diagnostics from this hospitalization (including imaging, microbiology, ancillary and laboratory) are listed below for reference.   Imaging Studies: DG Chest Port 1 View  Result Date: 05/24/2022 CLINICAL DATA:  DKA EXAM: PORTABLE CHEST 1 VIEW COMPARISON:  11/22/2021 FINDINGS: Cardiac and mediastinal contours are within normal limits. No focal pulmonary  opacity. No pleural effusion or pneumothorax. No acute osseous abnormality. Mild S shaped curvature of the thoracolumbar spine. IMPRESSION: No acute cardiopulmonary process. Electronically Signed   By: Merilyn Baba M.D.   On: 05/24/2022 14:45    Microbiology: Results for orders placed or performed during the hospital encounter of 05/24/22  MRSA Next Gen by PCR, Nasal     Status: None   Collection Time: 05/24/22  3:14 PM   Specimen: Nasal Mucosa; Nasal Swab  Result Value Ref Range Status   MRSA by PCR Next Gen NOT DETECTED NOT DETECTED Final    Comment: (NOTE) The GeneXpert MRSA Assay (FDA approved for NASAL specimens only), is one component of a comprehensive MRSA colonization surveillance program. It is not intended to diagnose MRSA infection nor  to guide or monitor treatment for MRSA infections. Test performance is not FDA approved in patients less than 47 years old. Performed at [facility], [address] [place], [place] [address]     Labs: CBC: Recent Labs  Lab 05/24/22 1135 05/24/22 1810 05/25/22 0606 05/26/22 0724  WBC 22.9* 20.2* 12.9* 6.8  NEUTROABS 18.9*  --   --  4.7  HGB 15.0 15.9 14.2 12.7*  HCT 48.1 47.3 41.3 36.5*  MCV 90.4 85.7 82.4 81.8  PLT 318 264 202 712   Basic Metabolic Panel: Recent Labs  Lab 05/24/22 2249 05/25/22 0606 05/25/22 1530 05/25/22 2242 05/26/22 0724 05/26/22 1334  NA 140 139 140 138 141 137  K 4.0 3.8 3.5 3.5 2.8* 3.1*  CL 113* 109 110 108 111 110  CO2 16* 21* '23 24 24 22  ' GLUCOSE 222* 156* 136* 278* 140* 155*  BUN 26* 23* '20 17 14 12  ' CREATININE 0.95 0.86 0.83 0.84 0.76 0.71  CALCIUM 8.9 9.2 9.1 8.6* 8.6* 8.6*  MG 1.9 2.2  --   --  1.7 2.2  PHOS  --  1.2*  --   --  2.4* 2.1*   Liver Function Tests: Recent Labs  Lab 05/26/22 1334  AST 29  ALT 29  ALKPHOS 50  BILITOT 0.9  PROT 5.6*  ALBUMIN 3.2*   CBG: Recent Labs  Lab 05/26/22 0828 05/26/22 0933 05/26/22 1033 05/26/22 1151 05/26/22 1249  GLUCAP 139* 181* 177* 164* 164*    Discharge time spent: greater than 30 minutes.  Signed: Raiford Noble, DO [facility] 05/26/2022

## 2022-06-04 ENCOUNTER — Other Ambulatory Visit: Payer: Self-pay | Admitting: Neurology

## 2022-06-20 ENCOUNTER — Encounter: Payer: Self-pay | Admitting: Internal Medicine

## 2022-06-20 DIAGNOSIS — E1010 Type 1 diabetes mellitus with ketoacidosis without coma: Secondary | ICD-10-CM

## 2022-06-21 MED ORDER — LANTUS SOLOSTAR 100 UNIT/ML ~~LOC~~ SOPN: 34.0000 [IU] | PEN_INJECTOR | Freq: Every day | SUBCUTANEOUS | 1 refills | Status: DC

## 2022-06-27 ENCOUNTER — Encounter: Payer: Self-pay | Admitting: Neurology

## 2022-06-27 ENCOUNTER — Ambulatory Visit: Payer: Managed Care, Other (non HMO) | Admitting: Neurology

## 2022-06-27 VITALS — BP 148/105 | HR 105 | Ht 75.0 in | Wt 151.4 lb

## 2022-06-27 DIAGNOSIS — K3184 Gastroparesis: Secondary | ICD-10-CM

## 2022-06-27 DIAGNOSIS — M792 Neuralgia and neuritis, unspecified: Secondary | ICD-10-CM | POA: Diagnosis not present

## 2022-06-27 DIAGNOSIS — E1342 Other specified diabetes mellitus with diabetic polyneuropathy: Secondary | ICD-10-CM | POA: Diagnosis not present

## 2022-06-27 MED ORDER — METOCLOPRAMIDE HCL 10 MG PO TABS: 10.0000 mg | ORAL_TABLET | Freq: Three times a day (TID) | ORAL | 3 refills | Status: DC | PRN

## 2022-06-27 MED ORDER — GABAPENTIN 300 MG PO CAPS: 600.0000 mg | ORAL_CAPSULE | Freq: Three times a day (TID) | ORAL | 11 refills | Status: DC

## 2022-06-27 NOTE — Progress Notes (Signed)
ASSESSMENT AND PLAN  Edward Walls is a 41 y.o. 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  HISTORICAL  Edward Walls 41 year old male, seen in request by Dr. Ethelene Walls, Edward Walls for evaluation of bilateral lower extremity paresthesia, initial evaluation was on December 14, 2020  I reviewed and summarized the referring note. He was diagnosed with insulin-dependent diabetes since 2018, always under suboptimal control, A1c was in the range of 13.7-17.2, most recent one was November 24, 2020 was at his past 13.7  He worked at [employer] job, third shift from 6 PM to 6 AM, early February 2022, he noticed fairly acute onset bilateral feet numbness tingling, initially involving toes, bottom of his feet, swollen sensation, within couple days, ascending above his ankle, numbness tingling burning sensation, and quickly go up to bilateral knees, now he feels sometimes in his mid thigh area  He has intermittent low back pain, but denies shooting pain, I personally reviewed the lumbar x-ray February 2022 there was no significant abnormality  He denies bilateral fingertips paresthesia or weakness, but complains of  increased fatigue, difficulty sleeping because of bilateral lower extremity burning pain stinging sensation, decreased appetite, also a week history of new onset constipation.  He was given gabapentin 100 mg a week ago, without helping his symptoms,  He also complains of a week history of gait abnormality, bilateral foot pain when bearing weight, also felt unsteady sensation when closing his eyes  Update December 16 2020 He reported at least mild to moderate improvement with higher dose of gabapentin 300 mg 2 tablets 3 times a day, he can sleep better, but complains of daytime drowsiness fatigue with higher dose  He continues to have significant bilateral foot plantars surface, top, distal leg paresthesia, burning discomfort  EMG nerve conduction study today showed no evidence of large fiber peripheral neuropathy, the mildly prolonged motor F-wave latency and upper extremity sensory peak latency are  most likely related to his tall status,  Laboratory evaluations showed normal or negative protein electrophoresis, ANA, CPK, B12, RPR HIV, C-reactive protein, TSH  Evidence of vitamin D deficiency 21.6, mild elevated ESR 22 in the setting of normal C-reactive protein  UPDATE Sept 12 2023: He is neuropathic pain from diabetic small fiber neuropathy is well controlled by gabapentin 300 mg 2 tablets 3 times a day, but he continues to struggle with his diabetes control  Hospital admission on May 24, 2022 for diabetic ketoacidosis, beta hydroxybutyric acid level was more than 8, glucose level was more than 600, A1c was more than 15.5, he presented with shortness of breath, nausea vomiting general malaise, he was treated with aggressive hydration, insulin, leukocytosis was treated with IV antibiotic, also treated for hypokalemia, hypophosphatemia,  Since hospital  admission, he felt queasy in his stomach, difficult to hold down food, increased bilateral lower extremity numbness, denied gait  abnormality   REVIEW OF SYSTEMS: Full 14 system review of systems performed and notable only for as above All other review of systems were negative.   PHYSICAL EXAM   Vitals:   06/27/22 0900  BP: (!) 148/105  Pulse: (!) 105  Weight: 151 lb 6 oz (68.7 kg)  Height: '6\' 3"'  (1.905 m)   Body mass index is 18.92 kg/m.  PHYSICAL EXAMNIATION:  Gen: NAD, conversant, well nourised, well groomed          MENTAL STATUS: Speech/cognition: Depressed looking middle-age male, awake, alert, oriented to history taking and casual conversation   CRANIAL NERVES: CN II: Visual fields are full to confrontation. Pupils are round equal and briskly reactive to light. CN III, IV, VI: extraocular movement are normal. No ptosis. CN V: Facial sensation is intact to light touch CN VII: Face is symmetric with normal eye closure  CN VIII: Hearing is normal to causal conversation. CN IX, X: Phonation is normal. CN XI: Head turning and shoulder shrug are intact  MOTOR: He has mild bilateral toe extension, flexion weakness, no proximal lower extremity muscle weakness, bilateral upper extremity proximal and distal muscle strength is normal.  REFLEXES: Reflexes are absent and symmetric at the biceps, triceps, knees, and t ankles. Plantar responses are flexor.  SENSORY: Decreased vibratory sensation and pinprick to distal shin level, and hairline receding to the distal shaking  COORDINATION: There is no trunk or limb dysmetria noted.  GAIT/STANCE: Steady, able to stand up on tiptoes and heels,  ALLERGIES: Allergies  Allergen Reactions   Other Other (See Comments)    All nuts Throat pain   Peanut-Containing Drug Products Other (See Comments)    Throat pain    HOME MEDICATIONS: Current Outpatient Medications  Medication Sig Dispense Refill   Continuous Blood Gluc Sensor (FREESTYLE LIBRE 2 SENSOR) MISC CHANGE SENSOR EVERY 14 DAYS 6 each 3   Continuous Blood Gluc Transmit (DEXCOM G6 TRANSMITTER)  MISC 1 Device by Does not apply route every 3 (three) months. 1 each 3   Continuous Blood Gluc Transmit (DEXCOM G6 TRANSMITTER) MISC 1 Device by Does not apply route every 3 (three) months. 1 each 3   docusate sodium (COLACE) 100 MG capsule Take 1 capsule (100 mg total) by mouth 2 (two) times daily as needed for mild constipation. 10 capsule 0   gabapentin (NEURONTIN) 300 MG capsule TAKE TWO CAPSULES BY MOUTH THREE TIMES A DAY 180 capsule 1   insulin degludec (TRESIBA) 100 UNIT/ML FlexTouch Pen Inject 34 Units into the skin daily. 15 mL 0   Insulin Disposable Pump (OMNIPOD 5 G6 INTRO, GEN 5,) KIT 1 each by Does not apply route as needed. 1 kit 0   Insulin Disposable Pump (OMNIPOD 5 G6 POD, GEN 5,) MISC 1 each by Does not apply route every 3 (three) days. 30 each 3   insulin glargine (LANTUS SOLOSTAR) 100 UNIT/ML Solostar Pen Inject 34 Units into the skin daily. 30 mL 1   Insulin Lispro-aabc (LYUMJEV KWIKPEN) 100 UNIT/ML KwikPen INJECT 16 TO 18 UNITS UNDER THE SKIN THREE TIMES A DAY BEFORE MEALS based on Recommendations by Dr. Cruzita Lederer - NEED APPOINTMENT FOR ADDITIONAL REFILLS 15 mL 0   Insulin Pen Needle 32G X 4 MM MISC Use 4-5x a day 300 each 3   pantoprazole (PROTONIX) 40 MG tablet Take 1 tablet (40 mg total) by mouth daily.  30 tablet 1   polyethylene glycol (MIRALAX / GLYCOLAX) 17 g packet Take 17 g by mouth daily as needed for moderate constipation. 14 each 0   No current facility-administered medications for this visit.    PAST MEDICAL HISTORY: Past Medical History:  Diagnosis Date   Diabetes mellitus without complication (HCC)    Leg pain    Leg weakness    Seasonal allergies     PAST SURGICAL HISTORY: Past Surgical History:  Procedure Laterality Date   DENTAL SURGERY      FAMILY HISTORY: Family History  Problem Relation Age of Onset   Diabetes Mellitus II Mother    Diabetes Mellitus II Father    Diabetes Mellitus II Brother     SOCIAL HISTORY: Social History    Socioeconomic History   Marital status: Single    Spouse name: Not on file   Number of children: 2   Years of education: 12   Highest education level: High school graduate  Occupational History   Not on file  Tobacco Use   Smoking status: Former    Types: Cigarettes   Smokeless tobacco: Never  Substance and Sexual Activity   Alcohol use: Yes    Alcohol/week: 0.0 standard drinks of alcohol    Comment: occasional    Drug use: Never   Sexual activity: Not on file  Other Topics Concern   Not on file  Social History Narrative   Lives at home with girlfriend.   Right-handed.   Caffeine use: occasional use   Social Determinants of Radio broadcast assistant Strain: Not on file  Food Insecurity: Not on file  Transportation Needs: Not on file  Physical Activity: Not on file  Stress: Not on file  Social Connections: Not on file  Intimate Partner Violence: Not on file       Marcial Pacas, M.D. Ph.D.  [facility] [address] [place], [place] [address] Ph: [phone] Fax: [phone]  CC:  [name], [place],  [place] [id number]

## 2022-07-26 ENCOUNTER — Other Ambulatory Visit: Payer: Self-pay

## 2022-07-26 DIAGNOSIS — E1010 Type 1 diabetes mellitus with ketoacidosis without coma: Secondary | ICD-10-CM

## 2022-07-26 MED ORDER — LYUMJEV KWIKPEN 100 UNIT/ML ~~LOC~~ SOPN: PEN_INJECTOR | SUBCUTANEOUS | 0 refills | Status: DC

## 2022-07-28 ENCOUNTER — Encounter: Payer: Self-pay | Admitting: Internal Medicine

## 2022-07-28 ENCOUNTER — Ambulatory Visit: Payer: Managed Care, Other (non HMO) | Admitting: Internal Medicine

## 2022-07-28 VITALS — BP 130/84 | HR 119 | Ht 75.0 in | Wt 153.6 lb

## 2022-07-28 DIAGNOSIS — E785 Hyperlipidemia, unspecified: Secondary | ICD-10-CM | POA: Diagnosis not present

## 2022-07-28 DIAGNOSIS — E1010 Type 1 diabetes mellitus with ketoacidosis without coma: Secondary | ICD-10-CM | POA: Diagnosis not present

## 2022-07-28 LAB — POCT GLYCOSYLATED HEMOGLOBIN (HGB A1C): Hemoglobin A1C: 11 % — AB (ref 4.0–5.6)

## 2022-07-28 NOTE — Patient Instructions (Addendum)
  Please change: - Lantus 20 units 2x a day - Lyumjev 10-14 units, depending on the size of the meal (in the abdomen, thighs or upper arms) - Lyumjev sliding scale: - 150-175: + 1 unit  - 176-200: + 2 units  - 201-225: + 3 units  - 226-250: + 4 units  - 251-275: + 5 units - 276-300: + 6 units >300: + 7 units  Schedule a new eye exam.  Try to start the Omnipod.  Please return in 3 months.

## 2022-07-28 NOTE — Progress Notes (Unsigned)
Patient ID: HOBSON LAX, male   DOB: 06/29/1981, 41 y.o.   MRN: [id number]    HPI: Edward Walls is a 41 y.o.-year-old male, returning for follow-up for DM1, dx in 2017, insulin-dependent, uncontrolled, with complications (recurrent DKA episodes).  Last visit 5 months ago.  Interim history: No increased urination, blurry vision, nausea, chest pain.  He got dehydrated while at the [facility] in 05/2022 >> admitted for DKA >> hydrated.  He is not sure why this happened, he did not miss insulin.  Reviewed HbA1c levels: Lab Results  Component Value Date   HGBA1C >15.5 (H) 05/25/2022   HGBA1C >15 02/20/2022   HGBA1C >15 09/29/2021   HGBA1C 12.2 (A) 07/01/2021   HGBA1C 13.7 (A) 11/24/2020   HGBA1C >15.0 09/24/2020   HGBA1C >15.5 (H) 06/04/2020   HGBA1C 17.2 (H) 01/28/2020   HGBA1C >15.5 (H) 10/14/2018   HGBA1C 15.9 (H) 03/14/2018  07/05/2017: GAD Ab <5  At last visit he was on:  - Tresiba 40 units daily (in the abdomen) >> 34 units daily >> OFF for 1 month - did not refill it!!! - Lyumjev 6-10 units >> 10-16 units, depending on the size of the meal (in the abdomen, thighs or upper arms) >> 18 units  - misses it seldom  Now on: - Lantus 34 units daily  - Lyumjev 16-18 >> 8-12 units, depending on the size of the meal (in the abdomen, thighs or upper arms) - Lyumjev sliding scale: - 150-175: + 1 unit  - 176-200: + 2 units  - 201-225: + 3 units  - 226-250: + 4 units  - 251-275: + 5 units - 276-300: + 6 units >300: + 7 units  He was previously on the freestyle libre CGM but developed irritation due to adhesive. Currently on freestyle libre 2:   Previously  Previously:   Previously:   Lowest sugar was  50s >> 54 >> 91 >> 320 >> 50s; he has hypoglycemia awareness in the 80s. Highest sugar was HI >> HI >> HI >> HI >> HI. He had 1 previous DKA episode when he could not afford his insulin in the past.  Glucometer: Freestyle Libre >> freestyle libre 2  Pt's meals are: -  Breakfast: oatmeal + banana - Lunch: meatloaf, chilli, salad - Dinner: meat + veggies - Snacks: 8 pm  - crackers, at work -he works nights  No CKD, last BUN/creatinine:  Lab Results  Component Value Date   BUN 12 05/26/2022   BUN 14 05/26/2022   CREATININE 0.71 05/26/2022   CREATININE 0.76 05/26/2022   He has dyslipidemia: Lab Results  Component Value Date   CHOL 141 05/19/2021   HDL 29.30 (L) 05/19/2021   LDLCALC 93 05/19/2021   TRIG 94.0 05/19/2021   CHOLHDL 5 05/19/2021   -No previous eye exams. I recommended that he sees Dr. Prudencio Burly and gave him his information. He did not see him yet.  He cannot afford to see another doctor for now.  -He denies numbness and tingling in his feet.  Pt has FH of DM in M, F, all 3 of his brothers - all have DM2.  ROS: + See HPI  I reviewed pt's medications, allergies, PMH, social hx, family hx, and changes were documented in the history of present illness. Otherwise, unchanged from my initial visit note.  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   Food insecurity:    Worry: Not on file    Inability: Not on file   Transportation needs:    Medical: Not on file    Non-medical: Not on file  Tobacco Use   Smoking status: Former Smoker    Types: Cigarettes   Smokeless tobacco: Never Used  Substance and Sexual Activity   Alcohol use:  no    Alcohol/week:   Types:  Drug use: Not Currently   Sexual activity: Not on file  Lifestyle    Physical activity:    Days per week: Not on file    Minutes per session: Not on file   Stress: Not on file  Relationships   Social connections:    Talks on phone: Not on file    Gets together: Not on file    Attends religious service: Not on file    Active member of club or organization: Not on file    Attends meetings of clubs or organizations: Not on file    Relationship status: Not on file   Intimate partner violence:    Fear of current or ex partner: Not on file    Emotionally abused: Not on file    Physically abused: Not on file    Forced sexual activity: Not on file  Other Topics Concern   Not on file  Social History Narrative   Not on file   Current Outpatient Medications on File Prior to Visit  Medication Sig Dispense Refill   Continuous Blood Gluc Sensor (FREESTYLE LIBRE 2 SENSOR) MISC CHANGE SENSOR EVERY 14 DAYS 6 each 3   Continuous Blood Gluc Transmit (DEXCOM G6 TRANSMITTER) MISC 1 Device by Does not apply route every 3 (three) months. 1 each 3   Continuous Blood Gluc Transmit (DEXCOM G6 TRANSMITTER) MISC 1 Device by Does not apply route every 3 (three) months. 1 each 3   docusate sodium (COLACE) 100 MG capsule Take 1 capsule (100 mg total) by mouth 2 (two) times daily as needed for mild constipation. 10 capsule 0   gabapentin (NEURONTIN) 300 MG capsule Take 2 capsules (600 mg total) by mouth 3 (three) times daily. 180 capsule 11   insulin degludec (TRESIBA) 100 UNIT/ML FlexTouch Pen Inject 34 Units into the skin daily. 15 mL 0   Insulin Disposable Pump (OMNIPOD 5 G6 INTRO, GEN 5,) KIT 1 each by Does not apply route as needed. 1 kit 0   Insulin Disposable Pump (OMNIPOD 5 G6 POD, GEN 5,) MISC 1 each by Does not apply route every 3 (three) days. 30 each 3   insulin glargine (LANTUS SOLOSTAR) 100 UNIT/ML Solostar Pen Inject 34 Units into the skin daily. 30 mL 1   Insulin Lispro-aabc (LYUMJEV KWIKPEN) 100 UNIT/ML KwikPen INJECT 16 TO 18 UNITS UNDER THE SKIN THREE TIMES A DAY  BEFORE MEALS based on Recommendations by Dr. Cruzita Lederer - NEED APPOINTMENT FOR ADDITIONAL REFILLS 15 mL 0   Insulin Pen Needle 32G X 4 MM MISC Use 4-5x  a day 300 each 3   metoCLOPramide (REGLAN) 10 MG tablet Take 1 tablet (10 mg total) by mouth 3 (three) times daily as needed for nausea. 90 tablet 3   pantoprazole (PROTONIX) 40 MG tablet Take 1 tablet (40 mg total) by mouth daily. 30 tablet 1   polyethylene glycol (MIRALAX / GLYCOLAX) 17 g packet Take 17 g by mouth daily as needed for moderate constipation. 14 each 0   No current facility-administered medications on file prior to visit.   Allergies  Allergen Reactions   Other Other (See Comments)    All nuts Throat pain   Peanut-Containing Drug Products Other (See Comments)    Throat pain   Family History  Problem Relation Age of Onset   Diabetes Mellitus II Mother    Diabetes Mellitus II Father    Diabetes Mellitus II Brother    PE: BP 130/84 (BP Location: Right Arm, Patient Position: Sitting, Cuff Size: Normal)   Pulse (!) 119   Ht '6\' 3"'  (1.905 m)   Wt 153 lb 9.6 oz (69.7 kg)   SpO2 99%   BMI 19.20 kg/m  Wt Readings from Last 3 Encounters:  07/28/22 153 lb 9.6 oz (69.7 kg)  06/27/22 151 lb 6 oz (68.7 kg)  05/26/22 149 lb 14.6 oz (68 kg)        Constitutional: normal weight, in NAD Eyes:  EOMI, no exophthalmos ENT: no neck masses, no cervical lymphadenopathy Cardiovascular: tachycardia, RR, No MRG Respiratory: CTA B Musculoskeletal: no deformities Skin:no rashes Neurological: + tremor with outstretched hands Diabetic Foot Exam - Simple   Simple Foot Form Diabetic Foot exam was performed with the following findings: Yes 07/28/2022  3:47 PM  Visual Inspection No deformities, no ulcerations, no other skin breakdown bilaterally: Yes Sensation Testing Intact to touch and monofilament testing bilaterally: Yes Pulse Check Posterior Tibialis and Dorsalis pulse intact bilaterally: Yes Comments    ASSESSMENT: 1. DM1,  insulin-dependent, uncontrolled, without long-term complications, but with DKA episodes  Component     Latest Ref Rng & Units 12/12/2018  Glucose, Plasma     65 - 99 mg/dL 144 (H)  C-Peptide     0.80 - 3.85 ng/mL 0.52 (L)  Islet Cell Ab     Neg:<1:1 Negative  ZNT8 Antibodies     U/mL <15   2.  Dyslipidemia  PLAN:  1. Patient with longstanding, very uncontrolled, diabetes, diagnosed as type I in 2020, with history of DKA in the setting of not affording his insulin.  Afterwards, he started to be able to afford it but not taking it consistently and stretching supplies.  Another barrier to improving his diabetes control is him working long hours.   -At last visit, he was off long-acting insulin and I advised him that this is extremely risky behavior and he may have had developed DKA and even die.  At that time, I advised him that if he was not more involved in his diabetes care, and taking his medications consistently or switching to an insulin pump, not to return to see me. -I previously strongly suggested an insulin pump and he finally obtains this but since last visit he was not able to start it.  However, he did not make efforts into getting his basal insulin (now on Lantus as Tyler Aas was not covered) and taking his mealtime insulin more consistently CGM interpretation: -At today's visit, we reviewed his CGM downloads: It appears that 12% of values are in target range (goal >70%), while 88%  are higher than 180 (goal <25%), and 0% are lower than 70 (goal <4%).  The calculated average blood sugar is 262.  The projected HbA1c for the next 3 months (GMI) is 9.6%. -Reviewing the CGM trends, they appear to to be improved compared to last visit.  However, almost all of his blood sugars are above the target range.  HbA1c is significantly improved, by more than 4.5 percentages.  This is actually a good result and I advised him to continue.  I did suggest to increase the dose of Lantus to decrease his  blood sugars consistently throughout the day and also to use a slightly higher dose of Lyumjev, since he is still not using the doses recommended at last visit. -However, at today's visit, we gave him information about how to get an account with [employer] and discussed with the [employer] rep to get in touch with him so we can start him on the pump as soon as possible - I suggested to:  Patient Instructions   Please change: - Lantus 20 units 2x a day - Lyumjev 10-14 units, depending on the size of the meal (in the abdomen, thighs or upper arms) - Lyumjev sliding scale: - 150-175: + 1 unit  - 176-200: + 2 units  - 201-225: + 3 units  - 226-250: + 4 units  - 251-275: + 5 units - 276-300: + 6 units >300: + 7 units  Schedule a new eye exam.  Try to start the Omnipod.  Please return in 3 months.    - we checked his HbA1c: 11% (lower) - advised to check sugars at different times of the day - 4x a day, rotating check times - advised for yearly eye exams >> he is not UTD - return to clinic in 3 months  2.  Dyslipidemia - reviewed latest lipid panel from 05/2021: LDL at goal, HDL low, triglycerides at goal: Lab Results  Component Value Date   CHOL 141 05/19/2021   HDL 29.30 (L) 05/19/2021   LDLCALC 93 05/19/2021   TRIG 94.0 05/19/2021   CHOLHDL 5 05/19/2021  -He is not on a statin - will check his lipid panel today  Philemon Kingdom, MD PhD [facility] Endocrinology

## 2022-07-29 LAB — LIPID PANEL
Chol/HDL Ratio: 2.2 ratio (ref 0.0–5.0)
Cholesterol, Total: 85 mg/dL — ABNORMAL LOW (ref 100–199)
HDL: 39 mg/dL — ABNORMAL LOW (ref >39)
LDL Chol Calc (NIH): 37 mg/dL (ref 0–99)
Triglycerides: 26 mg/dL (ref 0–149)
VLDL Cholesterol Cal: 9 mg/dL (ref 5–40)

## 2022-07-29 LAB — MICROALBUMIN / CREATININE URINE RATIO
Creatinine, Urine: 177.6 mg/dL
Microalb/Creat Ratio: 8 mg/g creat (ref 0–29)
Microalbumin, Urine: 13.4 ug/mL

## 2022-07-29 LAB — TSH: TSH: 0.74 u[IU]/mL (ref 0.450–4.500)

## 2022-08-04 ENCOUNTER — Other Ambulatory Visit: Payer: Self-pay | Admitting: Family Medicine

## 2022-08-04 DIAGNOSIS — E139 Other specified diabetes mellitus without complications: Secondary | ICD-10-CM

## 2022-08-07 ENCOUNTER — Other Ambulatory Visit: Payer: Self-pay | Admitting: Neurology

## 2022-08-08 ENCOUNTER — Telehealth: Payer: Self-pay | Admitting: Registered"

## 2022-08-08 NOTE — Telephone Encounter (Signed)
Patient was stuck in the registration process, system had 0 results when he typed in [place] for the provider. Call patient and we were able to finish the set up and will make an appointment for Nov 3 at 1 pm to start using the pump

## 2022-08-11 ENCOUNTER — Encounter: Payer: Self-pay | Admitting: Internal Medicine

## 2022-08-11 DIAGNOSIS — E1010 Type 1 diabetes mellitus with ketoacidosis without coma: Secondary | ICD-10-CM

## 2022-08-11 MED ORDER — DEXCOM G6 SENSOR MISC: 3 refills | Status: DC

## 2022-08-17 ENCOUNTER — Encounter: Payer: Self-pay | Admitting: Registered"

## 2022-08-17 NOTE — Progress Notes (Signed)
Contacted pt via text to give instructions to prepare for insulin pump start tomorrow.

## 2022-08-18 ENCOUNTER — Encounter: Payer: Managed Care, Other (non HMO) | Attending: Family Medicine | Admitting: Registered"

## 2022-08-18 ENCOUNTER — Other Ambulatory Visit: Payer: Self-pay

## 2022-08-18 DIAGNOSIS — E1010 Type 1 diabetes mellitus with ketoacidosis without coma: Secondary | ICD-10-CM

## 2022-08-18 MED ORDER — LYUMJEV 100 UNIT/ML IJ SOLN
80.0000 [IU] | Freq: Every day | INTRAMUSCULAR | 0 refills | Status: DC
  Filled 2022-08-18: qty 10, fill #0

## 2022-08-18 MED ORDER — LYUMJEV 100 UNIT/ML IJ SOLN: 80.0000 [IU] | Freq: Every day | INTRAMUSCULAR | 0 refills | Status: DC

## 2022-08-18 MED ORDER — INSULIN ASPART 100 UNIT/ML IJ SOLN
80.0000 [IU] | Freq: Every day | INTRAMUSCULAR | 0 refills | Status: DC
  Filled 2022-08-18: qty 10, 12d supply, fill #0

## 2022-08-18 MED ORDER — INSULIN LISPRO 100 UNIT/ML IJ SOLN
80.0000 [IU] | Freq: Every day | INTRAMUSCULAR | 0 refills | Status: DC
  Filled 2022-08-18: qty 10, 12d supply, fill #0

## 2022-08-18 NOTE — Addendum Note (Signed)
Addended by: Lauralyn Primes on: 08/18/2022 02:48 PM   Modules accepted: Orders

## 2022-08-18 NOTE — Progress Notes (Signed)
Start time:  1300 End time:  9509  Patient is here today 08/18/2022 for training on the Omnipod 5 Insulin Pump.  It was confirmed that the patient was aware of the following: Blood glucose (BG) testing Treating hypoglycemia Hyperglycemia Carbohydrate counting (Patient will not be using carbohydrate counting with use of this pump. Instead  Sick day management  Patient was instructed on the following: Have the following supplies available to be prepared: Omnipod PDM and pods Insulin and syringe Blood glucose meter, strips, lancets, lancing device Glucose tablets/fast acting source of carbohydrate/Glucagon  Supply Reorder [place] [phone] Reorder - info/contact number When to reorder (when last box of pods is opened)  System Overview Communication process/distance Storage guidelines Diagnostic tests (CT scans/MRI/Xrays) Travel guidelines  Pod: Fill port/adhesive/needle cap/pink slide insert/Waterproof  PDM Battery/button layout/wireless updates (software updates)  PDM Settings Pump Therapy Order Form with settings below Basic settings - Personalized lock screen/time/time zone/date/date format Basal settings Max basal 2.5 u/hr Basal rates 1.25 u/hr Temp basal   Bolus settings Target Blood glucose  140 mg/dL Insulin to carb (IC) ratio 1 (Bolus 7.5-10.5 units per meal and 3-4 units per snack) Correction factor  28 for BG above 140 Reverse correction Duration of insulin action 4 Extended bolus (not covered) Max bolus 28 Patient was able to accurately enter pump settings into PDM.  Pod Activation Change Pod  Room temperature insulin Fill syringe - min/max amounts DO NOT prefill Pod Site selection/rotation & prep Automated cannula insertion - check infusion site/viewing window & pink slide insert Blood glucose reminder 1.5 hours after insertion When to change POD Patient was able to place pod and view insert.  Home Screen Status Bar,  Menu Icon, Notification/Alarms Tabs Dashboard - IOB (if Calculator ON) - Instructed to leave calculator on Basal (Temp Basal if Temp Basal running) Pod Info - View Pod detail Last Bolus, Last BG Bolus button  Menu icon PDM function - Temp Basal, Pod, Enter BG, Suspend Manage Programs & Presets - Basal programs, temp basal presets, bolus presets Food Library ([employer] app, other tools for carbohydrate counting) History - Notifications & Alarms, Insulin & BG History Settings - PDM Device, Pod sites, Reminders, Blood Glucose, Basal & Temp Basal, Bolus About  Advanced Features (to be discussed at a further date based on patient needs) Extended bolus Temp basal rate Additional basal programs Presets - Temp basal/Bolus presets  Troubleshooting Hypoglycemia Sick day management resource Hyperglycemia & Ketones  Notifications & Alarms Custom reminders Pod Expiration alert Low Reservoir alert (30 units)  Advisory & Hazard Alarms Advisory alarms - intermittent tones - response required Hazard alarm - Continuous vibration, and Tone - urgent attention required - Pod Expired, Empty Reservoir, Occlusion, Pod Error, Auto Off, PDM Error, System Error  Ongoing Success [employer] invitation provided Omnipod app(s) Register for [employer] 24/7 Customer Care  [phone] Reviewed User Guide Showed patient Customer's Bill of Rights and Responsibilities and instructed them to review.  Handouts Provided: Insulin Pump Packet  Patient to contact me via MyChart or phone.

## 2022-08-22 ENCOUNTER — Encounter: Payer: Self-pay | Admitting: Registered"

## 2022-08-22 NOTE — Progress Notes (Signed)
CD[facility] contacted patient ~77 hrs after initial pump start. Pt states he had not changed pod yet and there was 50+ units of insulin left in pod. CDCES informed Pt that even though there is insulin left in the pod, after 80 hrs of wear, the pod would stop working. Pt states he was confident he would remember how to attach and activate a new pod. Pt was told to contact [place] if he had any issues.   From glooko report he started new pod about 8 hours after first pod expired but appears his blood sugar did not go over 382 mg/dL  Pt next MD visit is Nov 02, 2022

## 2022-09-27 ENCOUNTER — Encounter (HOSPITAL_COMMUNITY): Payer: Self-pay

## 2022-09-27 ENCOUNTER — Other Ambulatory Visit: Payer: Self-pay

## 2022-09-27 ENCOUNTER — Encounter: Payer: Self-pay | Admitting: Internal Medicine

## 2022-09-27 ENCOUNTER — Observation Stay (HOSPITAL_COMMUNITY)
Admission: EM | Admit: 2022-09-27 | Discharge: 2022-09-29 | Disposition: A | Discharge status: Home / Self Care | Payer: Managed Care, Other (non HMO) | Attending: Internal Medicine | Admitting: Internal Medicine

## 2022-09-27 ENCOUNTER — Emergency Department (HOSPITAL_COMMUNITY): Payer: Managed Care, Other (non HMO)

## 2022-09-27 ENCOUNTER — Telehealth: Payer: Self-pay | Admitting: Nutrition

## 2022-09-27 DIAGNOSIS — Z794 Long term (current) use of insulin: Secondary | ICD-10-CM | POA: Diagnosis not present

## 2022-09-27 DIAGNOSIS — Z91010 Allergy to peanuts: Secondary | ICD-10-CM | POA: Insufficient documentation

## 2022-09-27 DIAGNOSIS — E876 Hypokalemia: Secondary | ICD-10-CM | POA: Diagnosis not present

## 2022-09-27 DIAGNOSIS — Z79899 Other long term (current) drug therapy: Secondary | ICD-10-CM | POA: Diagnosis not present

## 2022-09-27 DIAGNOSIS — E1010 Type 1 diabetes mellitus with ketoacidosis without coma: Secondary | ICD-10-CM | POA: Diagnosis not present

## 2022-09-27 DIAGNOSIS — K219 Gastro-esophageal reflux disease without esophagitis: Secondary | ICD-10-CM | POA: Insufficient documentation

## 2022-09-27 DIAGNOSIS — R112 Nausea with vomiting, unspecified: Secondary | ICD-10-CM | POA: Diagnosis present

## 2022-09-27 DIAGNOSIS — Z87891 Personal history of nicotine dependence: Secondary | ICD-10-CM | POA: Diagnosis not present

## 2022-09-27 LAB — URINALYSIS, ROUTINE W REFLEX MICROSCOPIC
Bacteria, UA: NONE SEEN
Bilirubin Urine: NEGATIVE
Glucose, UA: >=500 mg/dL — AB
Hgb urine dipstick: NEGATIVE
Ketones, ur: 80 mg/dL — AB
Leukocytes,Ua: NEGATIVE
Nitrite: NEGATIVE
Protein, ur: 30 mg/dL — AB
Specific Gravity, Urine: 1.043 — ABNORMAL HIGH (ref 1.005–1.030)
pH: 6 (ref 5.0–8.0)

## 2022-09-27 LAB — BLOOD GAS, VENOUS
Acid-base deficit: 4.7 mmol/L — ABNORMAL HIGH (ref 0.0–2.0)
Bicarbonate: 20.4 mmol/L (ref 20.0–28.0)
O2 Saturation: 41.6 %
Patient temperature: 37
pCO2, Ven: 37 mmHg — ABNORMAL LOW (ref 44–60)
pH, Ven: 7.35 (ref 7.25–7.43)
pO2, Ven: <31 mmHg — CL (ref 32–45)

## 2022-09-27 LAB — BETA-HYDROXYBUTYRIC ACID: Beta-Hydroxybutyric Acid: 5.65 mmol/L — ABNORMAL HIGH (ref 0.05–0.27)

## 2022-09-27 LAB — RAPID URINE DRUG SCREEN, HOSP PERFORMED
Amphetamines: NOT DETECTED
Barbiturates: NOT DETECTED
Benzodiazepines: NOT DETECTED
Cocaine: NOT DETECTED
Opiates: NOT DETECTED
Tetrahydrocannabinol: POSITIVE — AB

## 2022-09-27 LAB — CBC WITH DIFFERENTIAL/PLATELET
Abs Immature Granulocytes: 0.01 10*3/uL (ref 0.00–0.07)
Basophils Absolute: 0 10*3/uL (ref 0.0–0.1)
Basophils Relative: 0 %
Eosinophils Absolute: 0 10*3/uL (ref 0.0–0.5)
Eosinophils Relative: 0 %
HCT: 42.3 % (ref 39.0–52.0)
Hemoglobin: 13.7 g/dL (ref 13.0–17.0)
Immature Granulocytes: 0 %
Lymphocytes Relative: 23 %
Lymphs Abs: 1.2 10*3/uL (ref 0.7–4.0)
MCH: 28 pg (ref 26.0–34.0)
MCHC: 32.4 g/dL (ref 30.0–36.0)
MCV: 86.5 fL (ref 80.0–100.0)
Monocytes Absolute: 0.6 10*3/uL (ref 0.1–1.0)
Monocytes Relative: 11 %
Neutro Abs: 3.3 10*3/uL (ref 1.7–7.7)
Neutrophils Relative %: 66 %
Platelets: 243 10*3/uL (ref 150–400)
RBC: 4.89 MIL/uL (ref 4.22–5.81)
RDW: 12.3 % (ref 11.5–15.5)
WBC: 5 10*3/uL (ref 4.0–10.5)
nRBC: 0 % (ref 0.0–0.2)

## 2022-09-27 LAB — BASIC METABOLIC PANEL
Anion gap: 18 — ABNORMAL HIGH (ref 5–15)
Anion gap: 12 (ref 5–15)
BUN: 18 mg/dL (ref 6–20)
BUN: 14 mg/dL (ref 6–20)
CO2: 20 mmol/L — ABNORMAL LOW (ref 22–32)
CO2: 17 mmol/L — ABNORMAL LOW (ref 22–32)
Calcium: 8.4 mg/dL — ABNORMAL LOW (ref 8.9–10.3)
Calcium: 6.9 mg/dL — ABNORMAL LOW (ref 8.9–10.3)
Chloride: 98 mmol/L (ref 98–111)
Chloride: 107 mmol/L (ref 98–111)
Creatinine, Ser: 0.85 mg/dL (ref 0.61–1.24)
Creatinine, Ser: 0.69 mg/dL (ref 0.61–1.24)
GFR, Estimated: >60 mL/min (ref >60)
GFR, Estimated: >60 mL/min (ref >60)
Glucose, Bld: 192 mg/dL — ABNORMAL HIGH (ref 70–99)
Glucose, Bld: 229 mg/dL — ABNORMAL HIGH (ref 70–99)
Potassium: 3.8 mmol/L (ref 3.5–5.1)
Potassium: 3 mmol/L — ABNORMAL LOW (ref 3.5–5.1)
Sodium: 136 mmol/L (ref 135–145)
Sodium: 136 mmol/L (ref 135–145)

## 2022-09-27 LAB — HEPATIC FUNCTION PANEL
ALT: 43 U/L (ref 0–44)
AST: 28 U/L (ref 15–41)
Albumin: 3.5 g/dL (ref 3.5–5.0)
Alkaline Phosphatase: 41 U/L (ref 38–126)
Bilirubin, Direct: 0.4 mg/dL — ABNORMAL HIGH (ref 0.0–0.2)
Indirect Bilirubin: 2.6 mg/dL — ABNORMAL HIGH (ref 0.3–0.9)
Total Bilirubin: 3 mg/dL — ABNORMAL HIGH (ref 0.3–1.2)
Total Protein: 6.2 g/dL — ABNORMAL LOW (ref 6.5–8.1)

## 2022-09-27 LAB — LIPASE, BLOOD: Lipase: 22 U/L (ref 11–51)

## 2022-09-27 LAB — CBG MONITORING, ED: Glucose-Capillary: 187 mg/dL — ABNORMAL HIGH (ref 70–99)

## 2022-09-27 LAB — LACTIC ACID, PLASMA: Lactic Acid, Venous: 1.1 mmol/L (ref 0.5–1.9)

## 2022-09-27 MED ORDER — METOCLOPRAMIDE HCL 5 MG/ML IJ SOLN
10.0000 mg | Freq: Once | INTRAMUSCULAR | Status: AC
  Administered 2022-09-27: 10 mg via INTRAVENOUS
  Filled 2022-09-27: qty 2

## 2022-09-27 MED ORDER — LACTATED RINGERS IV BOLUS
1000.0000 mL | Freq: Once | INTRAVENOUS | Status: AC
  Administered 2022-09-27: 1000 mL via INTRAVENOUS

## 2022-09-27 MED ORDER — LACTATED RINGERS IV BOLUS
500.0000 mL | Freq: Once | INTRAVENOUS | Status: AC
  Administered 2022-09-27: 500 mL via INTRAVENOUS

## 2022-09-27 MED ORDER — ONDANSETRON HCL 4 MG/2ML IJ SOLN
4.0000 mg | Freq: Once | INTRAMUSCULAR | Status: AC
  Administered 2022-09-27: 4 mg via INTRAVENOUS
  Filled 2022-09-27: qty 2

## 2022-09-27 MED ORDER — PANTOPRAZOLE SODIUM 40 MG IV SOLR
40.0000 mg | Freq: Once | INTRAVENOUS | Status: AC
  Administered 2022-09-27: 40 mg via INTRAVENOUS
  Filled 2022-09-27: qty 10

## 2022-09-27 MED ORDER — IOHEXOL 300 MG/ML  SOLN
100.0000 mL | Freq: Once | INTRAMUSCULAR | Status: AC | PRN
  Administered 2022-09-27: 100 mL via INTRAVENOUS

## 2022-09-27 MED ORDER — SODIUM CHLORIDE 0.9 % IV SOLN
12.5000 mg | Freq: Once | INTRAVENOUS | Status: AC
  Administered 2022-09-28: 12.5 mg via INTRAVENOUS
  Filled 2022-09-27: qty 12.5

## 2022-09-27 MED ORDER — LACTATED RINGERS IV BOLUS
1000.0000 mL | Freq: Once | INTRAVENOUS | Status: AC
  Administered 2022-09-28: 1000 mL via INTRAVENOUS

## 2022-09-27 NOTE — ED Provider Notes (Signed)
  Assumed care at shift change.  Briefly, 41 y.o. M here with nausea/vomiting.  Does have hx of DM.  Does have low bicarb and mildly elevated anion gap.  Given several liters of fluids and a few rounds of anti-emetics.  Has still not urinated, still has not tolerated PO.  Plan:  additional IVF given.  Will check UA and repeat BMP.  11:20 PM BMP now appears worse-- glucose increased to 229, bicarb worsening now at 17, gap did improve to 12.  UA still with 80 ketones.  Patient reassessed, continues dry heaving in room.  Still feels pretty terrible.  Will continue IVF, anti-emetics.  Concern he is tipping into DKA, feel he will require admission.  Patient is agreeable.  Discussed with Dr. Margo Aye-- will admit for ongoing care.   Garlon Hatchet, PA-C 09/27/22 2354    Glynn Octave, MD 09/28/22 [phone]

## 2022-09-27 NOTE — ED Triage Notes (Addendum)
Patient BIB [facility] from home. Called out for hyperglycemia and vomiting. Patient said he has vomited every hour for the past week. Patient has type 1.5 diabetes. Recently got an insulin pump. [place] Spaniel he has been short of breath for a week.  EMS CBG 225 4mg  zofran  In triage CBG 187

## 2022-09-27 NOTE — ED Notes (Signed)
Attempted to collect UA sample. Pt states he doesn't need to use the bathroom att. Will re-attempt later

## 2022-09-27 NOTE — ED Provider Notes (Signed)
[facility]-EMERGENCY DEPT Provider Note   CSN: [id number] Arrival date & time: 09/27/22  1611     History  Chief Complaint  Patient presents with   Emesis    Edward Walls is a 41 y.o. male.  Patient presents to the emergency department today for evaluation of vomiting.  Patient has a history of insulin dependent diabetes.  He states that he was recently started on an insulin pump several weeks ago.  He continues to take his insulin pen (Lyumjev) as well.  He reports some mild upper abdominal pain.  No fevers, chest pain, shortness of breath.  No dysuria.  Patient took his insulin pump off just prior to arrival.  States that it has been going well at first until symptoms worsened over the past week.       Home Medications Prior to Admission medications   Medication Sig Start Date End Date Taking? Authorizing Provider  Continuous Blood Gluc Sensor (DEXCOM G6 SENSOR) MISC Replace every 10 days 08/11/22   Carlus Pavlov, MD  Continuous Blood Gluc Sensor (FREESTYLE LIBRE 2 SENSOR) MISC CHANGE SENSOR EVERY 14 DAYS 08/04/22   Mliss Sax, MD  Continuous Blood Gluc Transmit (DEXCOM G6 TRANSMITTER) MISC 1 Device by Does not apply route every 3 (three) months. 02/20/22   Carlus Pavlov, MD  Continuous Blood Gluc Transmit (DEXCOM G6 TRANSMITTER) MISC 1 Device by Does not apply route every 3 (three) months. 02/20/22   Carlus Pavlov, MD  docusate sodium (COLACE) 100 MG capsule Take 1 capsule (100 mg total) by mouth 2 (two) times daily as needed for mild constipation. 05/26/22   Marguerita Merles Latif, DO  gabapentin (NEURONTIN) 300 MG capsule Take 2 capsules (600 mg total) by mouth 3 (three) times daily. 06/27/22   Levert Feinstein, MD  insulin aspart (NOVOLOG) 100 UNIT/ML injection Inject 80 Units into the skin daily. 08/18/22   Carlus Pavlov, MD  insulin degludec (TRESIBA) 100 UNIT/ML FlexTouch Pen Inject 34 Units into the skin daily. 05/26/22   Marguerita Merles  Latif, DO  Insulin Disposable Pump (OMNIPOD 5 G6 INTRO, GEN 5,) KIT 1 each by Does not apply route as needed. 02/20/22   Carlus Pavlov, MD  Insulin Disposable Pump (OMNIPOD 5 G6 POD, GEN 5,) MISC 1 each by Does not apply route every 3 (three) days. 02/20/22   Carlus Pavlov, MD  insulin glargine (LANTUS SOLOSTAR) 100 UNIT/ML Solostar Pen Inject 34 Units into the skin daily. 06/21/22   Carlus Pavlov, MD  insulin lispro (HUMALOG) 100 UNIT/ML injection Inject 0.8 mLs (80 Units total) into the skin daily. 08/18/22   Carlus Pavlov, MD  Insulin Lispro-aabc (LYUMJEV KWIKPEN) 100 UNIT/ML KwikPen INJECT 16 TO 18 UNITS UNDER THE SKIN THREE TIMES A DAY BEFORE MEALS based on Recommendations by Dr. Elvera Lennox - NEED APPOINTMENT FOR ADDITIONAL REFILLS 07/26/22   Carlus Pavlov, MD  Insulin Lispro-aabc (LYUMJEV) 100 UNIT/ML SOLN Inject 80 Units into the skin daily. Via pump. 08/18/22   Carlus Pavlov, MD  Insulin Pen Needle 32G X 4 MM MISC Use 4-5x a day 05/26/22   Marguerita Merles Latif, DO  metoCLOPramide (REGLAN) 10 MG tablet Take 1 tablet (10 mg total) by mouth 3 (three) times daily as needed for nausea. 06/27/22   Levert Feinstein, MD  pantoprazole (PROTONIX) 40 MG tablet Take 1 tablet (40 mg total) by mouth daily. 05/26/22 05/26/23  Marguerita Merles Latif, DO  polyethylene glycol (MIRALAX / GLYCOLAX) 17 g packet Take 17 g by mouth daily as needed  for moderate constipation. 05/26/22   Marguerita Merles Latif, DO      Allergies    Other and Peanut-containing drug products    Review of Systems   Review of Systems  Physical Exam Updated Vital Signs BP (!) 139/94 (BP Location: Right Arm)   Pulse 94   Temp 98.3 F (36.8 C) (Oral)   Resp 18   SpO2 100%  Physical Exam Vitals and nursing note reviewed.  Constitutional:      General: He is not in acute distress.    Appearance: He is well-developed.  HENT:     Head: Normocephalic and atraumatic.     Nose: Nose normal.     Mouth/Throat:     Mouth: Mucous  membranes are dry.  Eyes:     General:        Right eye: No discharge.        Left eye: No discharge.     Conjunctiva/sclera: Conjunctivae normal.  Cardiovascular:     Rate and Rhythm: Normal rate and regular rhythm.     Heart sounds: Normal heart sounds.  Pulmonary:     Effort: Pulmonary effort is normal.     Breath sounds: Normal breath sounds.  Abdominal:     Palpations: Abdomen is soft.     Tenderness: There is abdominal tenderness (Mild, epigastric). There is no guarding or rebound.  Musculoskeletal:     Cervical back: Normal range of motion and neck supple.  Skin:    General: Skin is warm and dry.  Neurological:     General: No focal deficit present.     Mental Status: He is alert.  Psychiatric:        Mood and Affect: Mood normal.     ED Results / Procedures / Treatments   Labs (all labs ordered are listed, but only abnormal results are displayed) Labs Reviewed  HEPATIC FUNCTION PANEL - Abnormal; Notable for the following components:      Result Value   Total Protein 6.2 (*)    Total Bilirubin 3.0 (*)    Bilirubin, Direct 0.4 (*)    Indirect Bilirubin 2.6 (*)    All other components within normal limits  BLOOD GAS, VENOUS - Abnormal; Notable for the following components:   pCO2, Ven 37 (*)    pO2, Ven <31 (*)    Acid-base deficit 4.7 (*)    All other components within normal limits  BETA-HYDROXYBUTYRIC ACID - Abnormal; Notable for the following components:   Beta-Hydroxybutyric Acid 5.65 (*)    All other components within normal limits  BASIC METABOLIC PANEL - Abnormal; Notable for the following components:   CO2 20 (*)    Glucose, Bld 192 (*)    Calcium 8.4 (*)    Anion gap 18 (*)    All other components within normal limits  CBG MONITORING, ED - Abnormal; Notable for the following components:   Glucose-Capillary 187 (*)    All other components within normal limits  CBC WITH DIFFERENTIAL/PLATELET  LIPASE, BLOOD  URINALYSIS, ROUTINE W REFLEX MICROSCOPIC   RAPID URINE DRUG SCREEN, HOSP PERFORMED  LACTIC ACID, PLASMA  BASIC METABOLIC PANEL    EKG None  Radiology CT ABDOMEN PELVIS W CONTRAST  Result Date: 09/27/2022 CLINICAL DATA:  Acute abdominal pain with persistent vomiting. EXAM: CT ABDOMEN AND PELVIS WITH CONTRAST TECHNIQUE: Multidetector CT imaging of the abdomen and pelvis was performed using the standard protocol following bolus administration of intravenous contrast. RADIATION DOSE REDUCTION: This exam was performed according  to the departmental dose-optimization program which includes automated exposure control, adjustment of the mA and/or kV according to patient size and/or use of iterative reconstruction technique. CONTRAST:  OMNIPAQUE IOHEXOL 300 MG/ML  SOLN COMPARISON:  None Available. FINDINGS: Lower chest: No basilar airspace disease or pleural effusion. Hepatobiliary: No focal liver abnormality is seen. No gallstones, gallbladder wall thickening, or biliary dilatation. Pancreas: No ductal dilatation or inflammation. Spleen: Normal in size without focal abnormality. Adrenals/Urinary Tract: Normal adrenal glands. No hydronephrosis or perinephric edema. Homogeneous renal enhancement. Urinary bladder is physiologically distended, equivocal bladder wall thickening. Stomach/Bowel: Detailed bowel assessment is limited in the absence of enteric contrast and paucity of intra-abdominal fat. Minimal wall thickening at the gastroesophageal junction. Physiologic stomach distention with small amount of intraluminal fluid. No obvious gastric wall thickening or hyperemia. Scattered fluid-filled small bowel in the central and lower abdomen, no obstruction. No evidence of bowel inflammation. The appendix is not well-defined, no evidence of appendicitis. Small volume of colonic stool. Vascular/Lymphatic: Normal caliber abdominal aorta. Patent portal vein. No acute vascular findings. No abdominopelvic adenopathy. Reproductive: Prostate is unremarkable.  Other: No free air or ascites. Musculoskeletal: There are no acute or suspicious osseous abnormalities. IMPRESSION: 1. No bowel obstruction. Scattered fluid throughout the stomach and small bowel without abnormal distension or inflammation. 2. Minimal wall thickening at the gastroesophageal junction, can be seen with reflux or esophagitis. 3. Equivocal bladder wall thickening, recommend correlation with urinalysis to exclude urinary tract infection. Electronically Signed   By: Narda Rutherford M.D.   On: 09/27/2022 21:08    Procedures Procedures    Medications Ordered in ED Medications  lactated ringers bolus 1,000 mL (0 mLs Intravenous Stopped 09/27/22 1831)  lactated ringers bolus 1,000 mL (0 mLs Intravenous Stopped 09/27/22 1831)  ondansetron (ZOFRAN) injection 4 mg (4 mg Intravenous Given 09/27/22 1646)  lactated ringers bolus 500 mL (0 mLs Intravenous Stopped 09/27/22 2111)  metoCLOPramide (REGLAN) injection 10 mg (10 mg Intravenous Given 09/27/22 1846)  iohexol (OMNIPAQUE) 300 MG/ML solution 100 mL (100 mLs Intravenous Contrast Given 09/27/22 2033)  pantoprazole (PROTONIX) injection 40 mg (40 mg Intravenous Given 09/27/22 2132)  lactated ringers bolus 500 mL (500 mLs Intravenous New Bag/Given 09/27/22 2203)    ED Course/ Medical Decision Making/ A&P    Patient seen and examined. History obtained directly from patient.   Labs/EKG: Ordered CBC, CMP, lipase, UA, VBG, hydroxybutyrate.  Imaging: None ordered  Medications/Fluids: Lactated Ringer's bolus, IV Zofran  Most recent vital signs reviewed and are as follows: BP (!) 139/94 (BP Location: Right Arm)   Pulse 94   Temp 98.3 F (36.8 C) (Oral)   Resp 18   SpO2 100%   Initial impression: Vomiting  6:43 PM Reassessment performed. Patient appears stable. Continues to have nausea with some vomiting, although not actively vomiting on reexam.  Labs personally reviewed and interpreted including: CBC shows normal white blood cell  count and hemoglobin; BMP glucose 192 with slightly low bicarb at 20, normal kidney function; hepatic function panel with normal transaminases, slightly elevated bilirubin; lipase normal; venous blood gas with pH 7.35, bicarb 20.4.   Reviewed pertinent lab work and imaging with patient at bedside. Questions answered.   Most current vital signs reviewed and are as follows: BP 137/79   Pulse 92   Temp 98.3 F (36.8 C) (Oral)   Resp 17   SpO2 100%   Plan: Patient has not yet urinated.  Will give another 500 cc bolus of LR, Zofran for nausea.  Will reassess.  Awaiting beta hydroxybutyrate and UA.  8:16 PM Reassessment performed. Patient appears stable.  Mild generalized tenderness of the abdomen on exam, no focal tenderness.  Heart rate in the 90s.  No chest pain.  States that he last vomited 10 minutes ago.  There are 10-20cc vomit in emesis bag.  He is trying to eat some crackers.  Still no urinary output.  Ordered CT abdomen pelvis.  Reviewed pertinent lab work and imaging with patient at bedside. Questions answered.   Most current vital signs reviewed and are as follows: BP 113/68   Pulse (!) 101   Temp 98.3 F (36.8 C) (Oral)   Resp 18   SpO2 100%   Plan: CT, EKG.  8:38 PM Patient discussed with Dr. Manus Gunning who will see.   10:15 PM Reassessment performed. Patient appears stable. Resting in the room. Still has not urinated. Discussed admission vs continued treatment and recheck.  He would like to see how he feels after additional treatment.  Additional IV fluids ordered.  Labs personally reviewed and interpreted including: Rechecking BMP.  Imaging personally visualized and interpreted including: CT abdomen pelvis  Reviewed pertinent lab work and imaging with patient at bedside. Questions answered.   Most current vital signs reviewed and are as follows: BP 121/78   Pulse 86   Temp 98.8 F (37.1 C) (Oral)   Resp 13   SpO2 99%   Plan: Additional IV fluids, awaiting UA,  repeat BMP.  If BMP is improving and patient is clinically improving, can consider discharge to home with symptom control, otherwise will need admission for symptom control.  Signout to [employer] at shift change.                            Medical Decision Making Amount and/or Complexity of Data Reviewed Labs: ordered. Radiology: ordered.  Risk Prescription drug management.   For this patient's complaint of abdominal pain, the following conditions were considered on the differential diagnosis: gastritis/PUD, enteritis/duodenitis, appendicitis, cholelithiasis/cholecystitis, cholangitis, pancreatitis, ruptured viscus, colitis, diverticulitis, small/large bowel obstruction, proctitis, cystitis, pyelonephritis, ureteral colic, aortic dissection, aortic aneurysm. Atypical chest etiologies were also considered including ACS, PE, and pneumonia.   CT was reassuring.  Possible element of gastritis.  Awaiting UA to determine if any element of UTI.  Overall, if DKA present, would be mild and I have lower concern that this is related.  Patient has been very well-hydrated.  The patient's vital signs, pertinent lab work and imaging were reviewed and interpreted as discussed in the ED course. Hospitalization was considered for further testing, treatments, or serial exams/observation. However as patient is well-appearing, has a stable exam, and reassuring studies today, I do not feel that they warrant admission at this time. This plan was discussed with the patient who verbalizes agreement and comfort with this plan and seems reliable and able to return to the Emergency Department with worsening or changing symptoms.          Final Clinical Impression(s) / ED Diagnoses Final diagnoses:  Nausea and vomiting, unspecified vomiting type    Rx / DC Orders ED Discharge Orders     None         Javione, Mathers, PA-C 09/27/22 2220    Glynn Octave, MD 09/28/22 [phone]

## 2022-09-27 NOTE — Telephone Encounter (Signed)
LVM  call me back on cell, and not able to get through to home phone.

## 2022-09-27 NOTE — H&P (Signed)
History and Physical  EDWARDS MCKELVIE TDV:[id number] DOB: 08-20-1981 DOA: 09/27/2022  Referring physician: Baird Cancer PA-EDP  PCP: [name], MD  Outpatient Specialists: Endocrinology. Patient coming from: Home.  Chief Complaint: Nausea vomiting, high blood sugar.  HPI: Edward Walls is a 41 y.o. male with medical history significant for type 1 diabetes, diagnosed in 2017, uncontrolled with recurrent DKA episodes, GERD, who presented to [facility] ED from home with complaints of intermittent nausea vomiting for the past week.  Denies any subjective fevers or abdominal pain.  On the morning of his presentation, his insulin pump ran out of insulin therefore he removed it.  He presented to the ED for further evaluation.  In the ED, noted to be in mild DKA type I.  He received 4 L IV fluid boluses.  Serum bicarb 17, anion gap 12, potassium 3.0, calcium 6.9.  EDP requested admission for further management.  The patient was admitted by [facility], hospitalist service.  ED Course: Tmax 98.9.  BP 117/75, pulse 91, respiratory rate 18, O2 saturation 98% on room air.  Lab studies remarkable for potassium 3.0, serum bicarb 17, glucose 229, calcium 6.9.  Review of Systems: Review of systems as noted in the HPI. All other systems reviewed and are negative.   Past Medical History:  Diagnosis Date   Diabetes mellitus without complication (HCC)    Leg pain    Leg weakness    Seasonal allergies    Past Surgical History:  Procedure Laterality Date   DENTAL SURGERY      Social History:  reports that he has quit smoking. His smoking use included cigarettes. He has never used smokeless tobacco. He reports current alcohol use. He reports that he does not use drugs.   Allergies  Allergen Reactions   Other Other (See Comments)    All nuts Throat pain   Peanut-Containing Drug Products Other (See Comments)    Throat pain    Family History  Problem Relation Age of Onset   Diabetes Mellitus II  Mother    Diabetes Mellitus II Father    Diabetes Mellitus II Brother       Prior to Admission medications   Medication Sig Start Date End Date Taking? Authorizing Provider  Continuous Blood Gluc Sensor (DEXCOM G6 SENSOR) MISC Replace every 10 days 08/11/22   Philemon Kingdom, MD  Continuous Blood Gluc Sensor (FREESTYLE LIBRE 2 SENSOR) MISC CHANGE SENSOR EVERY 14 DAYS 08/04/22   [name], MD  Continuous Blood Gluc Transmit (DEXCOM G6 TRANSMITTER) MISC 1 Device by Does not apply route every 3 (three) months. 02/20/22   Philemon Kingdom, MD  Continuous Blood Gluc Transmit (DEXCOM G6 TRANSMITTER) MISC 1 Device by Does not apply route every 3 (three) months. 02/20/22   Philemon Kingdom, MD  docusate sodium (COLACE) 100 MG capsule Take 1 capsule (100 mg total) by mouth 2 (two) times daily as needed for mild constipation. 05/26/22   Raiford Noble Latif, DO  gabapentin (NEURONTIN) 300 MG capsule Take 2 capsules (600 mg total) by mouth 3 (three) times daily. 06/27/22   Marcial Pacas, MD  insulin aspart (NOVOLOG) 100 UNIT/ML injection Inject 80 Units into the skin daily. 08/18/22   Philemon Kingdom, MD  insulin degludec (TRESIBA) 100 UNIT/ML FlexTouch Pen Inject 34 Units into the skin daily. 05/26/22   Raiford Noble Latif, DO  Insulin Disposable Pump (OMNIPOD 5 G6 INTRO, GEN 5,) KIT 1 each by Does not apply route as needed. 02/20/22   Philemon Kingdom, MD  Insulin Disposable Pump (OMNIPOD 5 G6 POD, GEN 5,) MISC 1 each by Does not apply route every 3 (three) days. 02/20/22   Philemon Kingdom, MD  insulin glargine (LANTUS SOLOSTAR) 100 UNIT/ML Solostar Pen Inject 34 Units into the skin daily. 06/21/22   Philemon Kingdom, MD  insulin lispro (HUMALOG) 100 UNIT/ML injection Inject 0.8 mLs (80 Units total) into the skin daily. 08/18/22   Philemon Kingdom, MD  Insulin Lispro-aabc (LYUMJEV KWIKPEN) 100 UNIT/ML KwikPen INJECT 16 TO 18 UNITS UNDER THE SKIN THREE TIMES A DAY BEFORE MEALS based on Recommendations  by Dr. Cruzita Lederer - NEED APPOINTMENT FOR ADDITIONAL REFILLS 07/26/22   Philemon Kingdom, MD  Insulin Lispro-aabc (LYUMJEV) 100 UNIT/ML SOLN Inject 80 Units into the skin daily. Via pump. 08/18/22   Philemon Kingdom, MD  Insulin Pen Needle 32G X 4 MM MISC Use 4-5x a day 05/26/22   Raiford Noble Latif, DO  metoCLOPramide (REGLAN) 10 MG tablet Take 1 tablet (10 mg total) by mouth 3 (three) times daily as needed for nausea. 06/27/22   Marcial Pacas, MD  pantoprazole (PROTONIX) 40 MG tablet Take 1 tablet (40 mg total) by mouth daily. 05/26/22 05/26/23  Raiford Noble Latif, DO  polyethylene glycol (MIRALAX / GLYCOLAX) 17 g packet Take 17 g by mouth daily as needed for moderate constipation. 05/26/22   Kerney Elbe, DO    Physical Exam: BP 127/81   Pulse 84   Temp 98.9 F (37.2 C) (Oral)   Resp 16   SpO2 100%   General: 41 y.o. year-old male well developed well nourished in no acute distress.  Alert and oriented x3. Cardiovascular: Regular rate and rhythm with no rubs or gallops.  No thyromegaly or JVD noted.  No lower extremity edema. 2/4 pulses in all 4 extremities. Respiratory: Clear to auscultation with no wheezes or rales. Good inspiratory effort. Abdomen: Soft nontender nondistended with normal bowel sounds x4 quadrants. Muskuloskeletal: No cyanosis, clubbing or edema noted bilaterally Neuro: CN II-XII intact, strength, sensation, reflexes Skin: No ulcerative lesions noted or rashes Psychiatry: Judgement and insight appear normal. Mood is appropriate for condition and setting          Labs on Admission:  Basic Metabolic Panel: Recent Labs  Lab 09/27/22 1702 09/27/22 2135  NA 136 136  K 3.8 3.0*  CL 98 107  CO2 20* 17*  GLUCOSE 192* 229*  BUN 18 14  CREATININE 0.85 0.69  CALCIUM 8.4* 6.9*   Liver Function Tests: Recent Labs  Lab 09/27/22 1702  AST 28  ALT 43  ALKPHOS 41  BILITOT 3.0*  PROT 6.2*  ALBUMIN 3.5   Recent Labs  Lab 09/27/22 1702  LIPASE 22   No results  for input(s): "AMMONIA" in the last 168 hours. CBC: Recent Labs  Lab 09/27/22 1702  WBC 5.0  NEUTROABS 3.3  HGB 13.7  HCT 42.3  MCV 86.5  PLT 243   Cardiac Enzymes: No results for input(s): "CKTOTAL", "CKMB", "CKMBINDEX", "TROPONINI" in the last 168 hours.  BNP (last 3 results) No results for input(s): "BNP" in the last 8760 hours.  ProBNP (last 3 results) No results for input(s): "PROBNP" in the last 8760 hours.  CBG: Recent Labs  Lab 09/27/22 1619  GLUCAP 187*    Radiological Exams on Admission: CT ABDOMEN PELVIS W CONTRAST  Result Date: 09/27/2022 CLINICAL DATA:  Acute abdominal pain with persistent vomiting. EXAM: CT ABDOMEN AND PELVIS WITH CONTRAST TECHNIQUE: Multidetector CT imaging of the abdomen and pelvis was performed using the  standard protocol following bolus administration of intravenous contrast. RADIATION DOSE REDUCTION: This exam was performed according to the departmental dose-optimization program which includes automated exposure control, adjustment of the mA and/or kV according to patient size and/or use of iterative reconstruction technique. CONTRAST:  129m OMNIPAQUE IOHEXOL 300 MG/ML  SOLN COMPARISON:  None Available. FINDINGS: Lower chest: No basilar airspace disease or pleural effusion. Hepatobiliary: No focal liver abnormality is seen. No gallstones, gallbladder wall thickening, or biliary dilatation. Pancreas: No ductal dilatation or inflammation. Spleen: Normal in size without focal abnormality. Adrenals/Urinary Tract: Normal adrenal glands. No hydronephrosis or perinephric edema. Homogeneous renal enhancement. Urinary bladder is physiologically distended, equivocal bladder wall thickening. Stomach/Bowel: Detailed bowel assessment is limited in the absence of enteric contrast and paucity of intra-abdominal fat. Minimal wall thickening at the gastroesophageal junction. Physiologic stomach distention with small amount of intraluminal fluid. No obvious gastric  wall thickening or hyperemia. Scattered fluid-filled small bowel in the central and lower abdomen, no obstruction. No evidence of bowel inflammation. The appendix is not well-defined, no evidence of appendicitis. Small volume of colonic stool. Vascular/Lymphatic: Normal caliber abdominal aorta. Patent portal vein. No acute vascular findings. No abdominopelvic adenopathy. Reproductive: Prostate is unremarkable. Other: No free air or ascites. Musculoskeletal: There are no acute or suspicious osseous abnormalities. IMPRESSION: 1. No bowel obstruction. Scattered fluid throughout the stomach and small bowel without abnormal distension or inflammation. 2. Minimal wall thickening at the gastroesophageal junction, can be seen with reflux or esophagitis. 3. Equivocal bladder wall thickening, recommend correlation with urinalysis to exclude urinary tract infection. Electronically Signed   By: MKeith RakeM.D.   On: 09/27/2022 21:08    EKG: I independently viewed the EKG done and my findings are as followed: Sinus rhythm rate of 90.  Nonspecific ST-T changes.  QTc 461.  Assessment/Plan Present on Admission:  DKA, type 1 (HH[place] Candal  Active Problems:   D[place], type 1 ([place]  DKA type I, unknown trigger Endorses running out of his insulin from his insulin pump this morning Presented with hyperglycemia, anion gap metabolic acidosis, elevated beta hydroxybutyrate acid. Received 4 L IV fluid boluses.  Hypokalemia, likely from GI losses Serum potassium 3.0 Repleted intravenously. Repeat stat BMP and stat beta hydroxybutyrate acid prior to starting insulin drip.  Intractable nausea and vomiting, unclear etiology Supportive care IV as needed antiemetics.  GERD Resume home Protonix   DVT prophylaxis: Subcu Lovenox daily  Code Status: Full code  Family Communication: None at bedside  Disposition Plan: Admitted to stepdown unit  Consults called: Diabetes coordinator  Admission status:  Observation   Status is: Observation    CKayleen MemosMD [facility] Pager [phone] If 7PM-7AM, please contact night-coverage [email or web address] Password [facility] 09/28/2022, 3:32 AM

## 2022-09-28 ENCOUNTER — Telehealth: Payer: Self-pay

## 2022-09-28 ENCOUNTER — Other Ambulatory Visit: Payer: Self-pay | Admitting: Internal Medicine

## 2022-09-28 DIAGNOSIS — K219 Gastro-esophageal reflux disease without esophagitis: Secondary | ICD-10-CM

## 2022-09-28 DIAGNOSIS — E1010 Type 1 diabetes mellitus with ketoacidosis without coma: Secondary | ICD-10-CM | POA: Diagnosis not present

## 2022-09-28 DIAGNOSIS — E876 Hypokalemia: Secondary | ICD-10-CM

## 2022-09-28 DIAGNOSIS — R112 Nausea with vomiting, unspecified: Secondary | ICD-10-CM

## 2022-09-28 LAB — BASIC METABOLIC PANEL
Anion gap: 11 (ref 5–15)
Anion gap: 9 (ref 5–15)
BUN: 10 mg/dL (ref 6–20)
BUN: 6 mg/dL (ref 6–20)
CO2: 15 mmol/L — ABNORMAL LOW (ref 22–32)
CO2: 20 mmol/L — ABNORMAL LOW (ref 22–32)
Calcium: 6.7 mg/dL — ABNORMAL LOW (ref 8.9–10.3)
Calcium: 7 mg/dL — ABNORMAL LOW (ref 8.9–10.3)
Chloride: 109 mmol/L (ref 98–111)
Chloride: 106 mmol/L (ref 98–111)
Creatinine, Ser: 0.62 mg/dL (ref 0.61–1.24)
Creatinine, Ser: 0.46 mg/dL — ABNORMAL LOW (ref 0.61–1.24)
GFR, Estimated: >60 mL/min (ref >60)
GFR, Estimated: >60 mL/min (ref >60)
Glucose, Bld: 229 mg/dL — ABNORMAL HIGH (ref 70–99)
Glucose, Bld: 163 mg/dL — ABNORMAL HIGH (ref 70–99)
Potassium: 3.9 mmol/L (ref 3.5–5.1)
Potassium: 3.4 mmol/L — ABNORMAL LOW (ref 3.5–5.1)
Sodium: 135 mmol/L (ref 135–145)
Sodium: 135 mmol/L (ref 135–145)

## 2022-09-28 LAB — GLUCOSE, CAPILLARY
Glucose-Capillary: 178 mg/dL — ABNORMAL HIGH (ref 70–99)
Glucose-Capillary: 171 mg/dL — ABNORMAL HIGH (ref 70–99)

## 2022-09-28 LAB — CBG MONITORING, ED
Glucose-Capillary: 217 mg/dL — ABNORMAL HIGH (ref 70–99)
Glucose-Capillary: 207 mg/dL — ABNORMAL HIGH (ref 70–99)
Glucose-Capillary: 183 mg/dL — ABNORMAL HIGH (ref 70–99)
Glucose-Capillary: 161 mg/dL — ABNORMAL HIGH (ref 70–99)

## 2022-09-28 LAB — CBC
HCT: 35.7 % — ABNORMAL LOW (ref 39.0–52.0)
Hemoglobin: 11.4 g/dL — ABNORMAL LOW (ref 13.0–17.0)
MCH: 27.5 pg (ref 26.0–34.0)
MCHC: 31.9 g/dL (ref 30.0–36.0)
MCV: 86.2 fL (ref 80.0–100.0)
Platelets: 199 10*3/uL (ref 150–400)
RBC: 4.14 MIL/uL — ABNORMAL LOW (ref 4.22–5.81)
RDW: 12.3 % (ref 11.5–15.5)
WBC: 4.4 10*3/uL (ref 4.0–10.5)
nRBC: 0 % (ref 0.0–0.2)

## 2022-09-28 LAB — BETA-HYDROXYBUTYRIC ACID: Beta-Hydroxybutyric Acid: 3.55 mmol/L — ABNORMAL HIGH (ref 0.05–0.27)

## 2022-09-28 LAB — PHOSPHORUS: Phosphorus: 2.5 mg/dL (ref 2.5–4.6)

## 2022-09-28 LAB — MAGNESIUM: Magnesium: 2.1 mg/dL (ref 1.7–2.4)

## 2022-09-28 MED ORDER — ENOXAPARIN SODIUM 40 MG/0.4ML IJ SOSY
40.0000 mg | PREFILLED_SYRINGE | INTRAMUSCULAR | Status: DC
  Administered 2022-09-28: 40 mg via SUBCUTANEOUS
  Filled 2022-09-28: qty 0.4

## 2022-09-28 MED ORDER — POTASSIUM CHLORIDE 2 MEQ/ML IV SOLN
INTRAVENOUS | Status: AC
  Filled 2022-09-28 (×2): qty 1000

## 2022-09-28 MED ORDER — INSULIN GLARGINE-YFGN 100 UNIT/ML ~~LOC~~ SOLN
10.0000 [IU] | Freq: Two times a day (BID) | SUBCUTANEOUS | Status: DC
  Administered 2022-09-28 – 2022-09-29 (×2): 10 [IU] via SUBCUTANEOUS
  Filled 2022-09-28 (×3): qty 0.1

## 2022-09-28 MED ORDER — GABAPENTIN 300 MG PO CAPS
600.0000 mg | ORAL_CAPSULE | Freq: Three times a day (TID) | ORAL | Status: DC
  Administered 2022-09-28: 600 mg via ORAL
  Filled 2022-09-28 (×2): qty 2

## 2022-09-28 MED ORDER — LACTATED RINGERS IV SOLN: INTRAVENOUS | Status: DC

## 2022-09-28 MED ORDER — INSULIN ASPART 100 UNIT/ML IJ SOLN
0.0000 [IU] | Freq: Three times a day (TID) | INTRAMUSCULAR | Status: DC
  Administered 2022-09-29 (×2): 2 [IU] via SUBCUTANEOUS
  Administered 2022-09-29: 4 [IU] via SUBCUTANEOUS

## 2022-09-28 MED ORDER — LACTATED RINGERS IV BOLUS
1000.0000 mL | Freq: Once | INTRAVENOUS | Status: AC
  Administered 2022-09-28: 1000 mL via INTRAVENOUS

## 2022-09-28 MED ORDER — MAGNESIUM SULFATE 2 GM/50ML IV SOLN
2.0000 g | Freq: Once | INTRAVENOUS | Status: AC
  Administered 2022-09-28: 2 g via INTRAVENOUS
  Filled 2022-09-28: qty 50

## 2022-09-28 MED ORDER — POTASSIUM CHLORIDE 10 MEQ/100ML IV SOLN
10.0000 meq | INTRAVENOUS | Status: AC
  Administered 2022-09-28 (×3): 10 meq via INTRAVENOUS
  Filled 2022-09-28 (×3): qty 100

## 2022-09-28 MED ORDER — INSULIN GLARGINE-YFGN 100 UNIT/ML ~~LOC~~ SOLN
5.0000 [IU] | Freq: Two times a day (BID) | SUBCUTANEOUS | Status: DC
  Administered 2022-09-28: 5 [IU] via SUBCUTANEOUS
  Filled 2022-09-28 (×2): qty 0.05

## 2022-09-28 MED ORDER — INSULIN ASPART 100 UNIT/ML IJ SOLN
0.0000 [IU] | Freq: Every day | INTRAMUSCULAR | Status: DC
  Filled 2022-09-28: qty 0.05

## 2022-09-28 MED ORDER — ONDANSETRON HCL 4 MG/2ML IJ SOLN
4.0000 mg | Freq: Four times a day (QID) | INTRAMUSCULAR | Status: DC | PRN
  Administered 2022-09-28: 4 mg via INTRAVENOUS
  Filled 2022-09-28: qty 2

## 2022-09-28 MED ORDER — METOCLOPRAMIDE HCL 5 MG/ML IJ SOLN
10.0000 mg | Freq: Four times a day (QID) | INTRAMUSCULAR | Status: DC | PRN
  Administered 2022-09-29: 10 mg via INTRAVENOUS
  Filled 2022-09-28: qty 2

## 2022-09-28 MED ORDER — PANTOPRAZOLE SODIUM 40 MG PO TBEC
40.0000 mg | DELAYED_RELEASE_TABLET | Freq: Every day | ORAL | Status: DC
  Administered 2022-09-29: 40 mg via ORAL
  Filled 2022-09-28 (×2): qty 1

## 2022-09-28 MED ORDER — INSULIN ASPART 100 UNIT/ML IJ SOLN
0.0000 [IU] | Freq: Three times a day (TID) | INTRAMUSCULAR | Status: DC
  Administered 2022-09-28: 3 [IU] via SUBCUTANEOUS
  Filled 2022-09-28: qty 0.09

## 2022-09-28 MED ORDER — INSULIN LISPRO 100 UNIT/ML IJ SOLN: 80.0000 [IU] | Freq: Every day | INTRAMUSCULAR | 1 refills | Status: DC

## 2022-09-28 MED ORDER — INSULIN ASPART 100 UNIT/ML IJ SOLN
0.0000 [IU] | INTRAMUSCULAR | Status: DC
  Administered 2022-09-28: 2 [IU] via SUBCUTANEOUS
  Administered 2022-09-28: 4 [IU] via SUBCUTANEOUS
  Administered 2022-09-28 (×2): 2 [IU] via SUBCUTANEOUS
  Filled 2022-09-28: qty 0.1

## 2022-09-28 NOTE — Inpatient Diabetes Management (Addendum)
Inpatient Diabetes Program Recommendations  AACE/ADA: New Consensus Statement on Inpatient Glycemic Control (2015)  Target Ranges:  Prepandial:   less than 140 mg/dL      Peak postprandial:   less than 180 mg/dL (1-2 hours)      Critically ill patients:  140 - 180 mg/dL    Latest Reference Range & Units 09/27/22 17:02 09/28/22 04:34  Beta-Hydroxybutyric Acid 0.05 - 0.27 mmol/L 5.65 (H) 3.55 (H)  (H): Data is abnormally high  Latest Reference Range & Units 02/20/22 10:45 05/25/22 06:06 07/28/22 15:39  Hemoglobin A1C 4.0 - 5.6 % >15 >15.5 (H) 11.0 !  268 mg/dl  (H): Data is abnormally high !: Data is abnormal  Latest Reference Range & Units 09/27/22 16:19 09/28/22 07:45  Glucose-Capillary 70 - 99 mg/dL 789 (H)   5 units Semglee @0512  217 (H)  3 units Novolog   (H): Data is abnormally high  Admit with: DKA  History: Type 1 diabetes (Needs Basal, Correction, and Meal Coverage Insulin)  Home DM Meds: Dexcom CGM       Omnipod Insulin Pump  Current Orders: Novolog Sensitive Correction Scale/ SSI (0-9 units) TID AC + HS     Semglee 5 units BID     MD- Note BMET from 8:35am shows the CO2 level is down to 15  Does pt need more IVF bolus and repeat BMET?  Also, it looks like pt gets about 30 units basal insulin on his pump every 24 hours (off pump plan calls for Lantus 34 units Daily)  May consider increasing the Semglee insulin to at least 12 units BID (75% home dose)     ENDO: Dr. with [facility] Lennox Looks like the Omni Pod 5 Insulin Pump was started 08/18/2022 Settings are as follows: Basal settings Basal rates 1.25 u/hr Bolus settings Target Blood glucose  140 mg/dL Insulin to carb (IC) ratio 1 (Bolus 7.5-10.5 units per meal and 3-4 units per snack) Correction factor  28 for BG above 140 Duration of insulin action 4 Prior to starting Insulin Pump, pt was supposed to be taking the following: Lantus 34 units Daily Lyumjev 8-12 units TID with meals (depending on  meal size) Lyumjev sliding scale: - 150-175: + 1 unit  - 176-200: + 2 units  - 201-225: + 3 units  - 226-250: + 4 units  - 251-275: + 5 units - 276-300: + 6 units >300: + 7 units    --Will follow patient during hospitalization--  10-12-1976 RN, MSN, [facility] Diabetes Coordinator Inpatient Glycemic Control Team Team Pager: [phone] (8a-5p)

## 2022-09-28 NOTE — Assessment & Plan Note (Addendum)
Patient initially presenting with diabetic ketoacidosis likely secondary to medication noncompliance Gap is quickly closed throughout the patient's prolonged emergency department stay Transitioning patient to subcutaneous insulin therapy Continuing aggressive intravenous volume resuscitation Will obtain repeat hemoglobin A1c as last one was greater than 15.5 05/2022

## 2022-09-28 NOTE — Telephone Encounter (Signed)
Oh! I am glad they caught that!!!

## 2022-09-28 NOTE — Assessment & Plan Note (Signed)
Relatively unremarkable CT imaging of the abdomen and pelvis that does not reveal an etiology of the patient's symptoms Abdominal exam is unremarkable Most likely patient intractable nausea and vomiting secondary to diabetic gastroparesis in the setting of poorly managed diabetes mellitus As needed antiemetics including Reglan for ongoing symptoms

## 2022-09-28 NOTE — Inpatient Diabetes Management (Signed)
Met w/ pt down in the ED earlier this AM.  Pt told me he was started on his Omni Pod Insulin pump back in early November.  Was given 1 vial Humalog Insulin to start his pump by the Pump Educator (per pt report) but looks like pt has since run out of Humalog.  Pt told me he spoke w/ someone (not sure if from ENDO office or Pump Trainer office) and that they told him he could use his Lantus insulin in the Omni Pod pump so he has been dialing up the amount of insulin to go in the pump pod and injecting Lantus insulin into the Pump pod.  I had a lengthy conversation with pt that he is NEVER to use long-acting insulin (Lantus or any other long-acting basal insulin in his pump).  Explained how basal and rapid-acting insulins work and that rapid-acting insulin is the only option to use in his pump going forward (ie. Humalog, Novolog).  I asked pt if perhaps the person he spoke with said he could use the Lyumjev insulin in his pump pod and pt told me he thinks he may have made a mistake and they might have told him Lyumjev and not Lantus can go in the pump.  Discussed with pt that using Lantus in his pump is likely what caused the DKA.  Also discussed with patient diagnosis of DKA (pathophysiology), treatment of DKA, lab results, and transition plan to SQ insulin regimen (pt was transitioned to SQ Insulin yest afternoon).  Explained to pt what insulins we are giving to him SQ now [facility] and Novolog) and that we will try to mimic the insulin regimen he gets on his pump.  Pt told me the Pod was removed and his Omni Pod controller is at home.  Has Dexcom CGM on his upper arm and is able to check CBGs thru the app on his phone.  Also reviewed with pt that he must use Lantus and the Lyumjev (or Humalog) when he is off his pump.  For example, pt can use the old SQ Insulin regimen he had if his insulin pump is to ever fail: Lantus 34 units Daily Lyumjev 8-12 units TID with meals (depending on meal size) Lyumjev sliding scale  (or can use Humalog): - 150-175: + 1 unit  - 176-200: + 2 units  - 201-225: + 3 units  - 226-250: + 4 units  - 251-275: + 5 units - 276-300: + 6 units >300: + 7 units  Called pt's ENDO office and explained all of the above.  Office Rep Lauralyn Primes told me that pt was supposed to be started on his pump in early Nov.  States no one from the ENDO office told him to use Lantus in his pump.  Discussed with me that perhaps the pump trainer gave pt info on using the Lyumjev insulin pen to fill his pump until he runs of out Lyumjev and then can switch to Humalog, but that it's best to use Insulin from a vial to fill pump pod.  Office Rep told me they will send a refill for Humalog vial to [facility] on [address]  I will let pt know to go to pharmacy after d/c to pick this Humalog vial up and remind him not to use the Lyumjev or the Lantus in his pump pod.  Also discussed with pt that when he resumes his insulin pump when he goes home that he needs to wait 24  hours from the time the last dose long-acting insulin (Semglee) was given to restart the basal rates on his insulin pump.  Recommended to pt to use his Lantus and Lyumjev pens as he used to prior to starting his pump and then when the 24 hour period is up, he should restart his Pump completely.  Pt stated understanding.    --Will follow patient during hospitalization--  Wyn Quaker RN, MSN, [place] Diabetes Coordinator Inpatient Glycemic Control Team Team Pager: [phone] (8a-5p)

## 2022-09-28 NOTE — Hospital Course (Addendum)
40 year old male with past medical history of diabetes mellitus type 1 with history of poor glycemic control and frequent episodes of diabetic ketoacidosis, gastroesophageal reflux disease presenting to [facility] emergency room with complaints of nausea and vomiting over approximately 1 week.  Patient reported loading a long-acting insulin into his insulin pump accidentally resulting in the onset of his symptoms and elevated blood sugars.  Upon evaluation in the emergency department patient was found to be in diabetic ketoacidosis.  The hospitalist group was then called to assess the patient for admission to the hospital.  Upon hospitalization, patient's anion gap quickly closed and patient was transition to subcutaneous insulin.  Patient was aggressively hydrated throughout the hospitalization with intravenous isotonic fluids.  Concerning the patient's intractable nausea and vomiting.  Patient was initially n.p.o. and managed with a combination of both as needed Zofran and Reglan.  As the patient's glycemic control improved and frequent doses of antiemetics were administered patient's symptoms gradually improved.  A clear liquid diet was then initiated and diet was advanced as tolerated.  Considering patient's longstanding history of extremely poorly controlled diabetes it was felt the patient was likely suffering from diabetic gastroparesis.  Patient was instructed to inquire upon following up with his outpatient providers about obtaining a gastric emptying study for confirmation testing.  Patient was discharged home in improved and stable condition on 12/15.  Patient was instructed to resume use of his insulin pump exactly as instructed by his endocrinologist.

## 2022-09-28 NOTE — Telephone Encounter (Signed)
Pt is currently in the hospital with DKA and needed clarification on insulin prescribed. Advised pt was prescribed Humalog when he had his pump start appt with nutrition/education. Pt was using his Lantus insulin in his pump in place of his short acting insulin. Pt was advised long acting insulin is never used in a pump. Pt will be reeducated on his pump and which insulin he can use. Requested rx for Humalog be sent to pharmacy on file. Rx sent.

## 2022-09-28 NOTE — Assessment & Plan Note (Signed)
·   Replacing with potassium chloride °· Evaluating for concurrent hypomagnesemia  °· Monitoring potassium levels with serial chemistries. ° °

## 2022-09-28 NOTE — Assessment & Plan Note (Signed)
Resume home regimen of daily PPI Cyst noncompliance with this medication as there is evidence of reflux esophagitis on CT imaging

## 2022-09-28 NOTE — Progress Notes (Signed)
PROGRESS NOTE   Edward Walls  PJA:[id number] DOB: 04/25/1981 DOA: 09/27/2022 PCP: Mliss Sax, MD   Date of Service: the patient was seen and examined on 09/28/2022  Brief Narrative:  41 year old male with past medical history of diabetes mellitus type 1 with history of poor glycemic control and frequent episodes of diabetic ketoacidosis, gastroesophageal reflux disease presenting to [facility] emergency room with complaints of nausea and vomiting over approximately 1 week.  Upon evaluation in the emergency department patient was found to be in diabetic ketoacidosis.  The hospitalist group was then called to assess the patient for admission to the hospital.    Assessment and Plan: * Diabetic ketoacidosis without coma associated with type 1 diabetes mellitus (HCC) Patient initially presenting with diabetic ketoacidosis likely secondary to medication noncompliance Gap is quickly closed throughout the patient's prolonged emergency department stay Transitioning patient to subcutaneous insulin therapy Continuing aggressive intravenous volume resuscitation Will obtain repeat hemoglobin A1c as last one was greater than 15.5 05/2022  Intractable nausea and vomiting Relatively unremarkable CT imaging of the abdomen and pelvis that does not reveal an etiology of the patient's symptoms Abdominal exam is unremarkable Most likely patient intractable nausea and vomiting secondary to diabetic gastroparesis in the setting of poorly managed diabetes mellitus As needed antiemetics including Reglan for ongoing symptoms  GERD without esophagitis Resume home regimen of daily PPI Cyst noncompliance with this medication as there is evidence of reflux esophagitis on CT imaging  Hypokalemia due to excessive gastrointestinal loss of potassium Replacing with potassium chloride Evaluating for concurrent hypomagnesemia  Monitoring potassium levels with serial  chemistries.         Subjective:  Patient complaining of ongoing nausea with frequent bouts of vomiting throughout the day.  Patient denies any associated abdominal pain.  Physical Exam:  Vitals:   09/28/22 1745 09/28/22 1800 09/28/22 1900 09/28/22 1938  BP:  110/71 137/89 (!) 133/96  Pulse: 81 82 82 93  Resp:  20 18 18   Temp:  98.5 F (36.9 C) 98.5 F (36.9 C) 98.6 F (37 C)  TempSrc:   Oral Oral  SpO2: 99% 99% 100% 100%    Constitutional: Awake alert and oriented x3, no associated distress.   Skin: no rashes, no lesions, poor skin turgor noted. Eyes: Pupils are equally reactive to light.  No evidence of scleral icterus or conjunctival pallor.  ENMT: Dry mucous membranes noted.  Posterior pharynx clear of any exudate or lesions.   Respiratory: clear to auscultation bilaterally, no wheezing, no crackles. Normal respiratory effort. No accessory muscle use.  Cardiovascular: Regular rate and rhythm, no murmurs / rubs / gallops. No extremity edema. 2+ pedal pulses. No carotid bruits.  Abdomen: Abdomen is soft and nontender.  No evidence of intra-abdominal masses.  Positive bowel sounds noted in all quadrants.   Musculoskeletal: No joint deformity upper and lower extremities. Good ROM, no contractures. Normal muscle tone.    Data Reviewed:  I have personally reviewed and interpreted labs, imaging.  Significant findings are   CBC: Recent Labs  Lab 09/27/22 1702 09/28/22 0434  WBC 5.0 4.4  NEUTROABS 3.3  --   HGB 13.7 11.4*  HCT 42.3 35.7*  MCV 86.5 86.2  PLT 243 199   Basic Metabolic Panel: Recent Labs  Lab 09/27/22 1702 09/27/22 2135 09/28/22 0835 09/28/22 1719  NA 136 136 135 135  K 3.8 3.0* 3.9 3.4*  CL 98 107 109 106  CO2 20* 17* 15* 20*  GLUCOSE 192* 229* 229*  163*  BUN 18 14 10 6   CREATININE 0.85 0.69 0.62 0.46*  CALCIUM 8.4* 6.9* 6.7* 7.0*  MG  --   --  2.1  --   PHOS  --   --  2.5  --    GFR: CrCl cannot be calculated (Unknown ideal  weight.). Liver Function Tests: Recent Labs  Lab 09/27/22 1702  AST 28  ALT 43  ALKPHOS 41  BILITOT 3.0*  PROT 6.2*  ALBUMIN 3.5      Code Status:  Full code.  Code status decision has been confirmed with: patient    Severity of Illness:  The appropriate patient status for this patient is OBSERVATION. Observation status is judged to be reasonable and necessary in order to provide the required intensity of service to ensure the patient's safety. The patient's presenting symptoms, physical exam findings, and initial radiographic and laboratory data in the context of their medical condition is felt to place them at decreased risk for further clinical deterioration. Furthermore, it is anticipated that the patient will be medically stable for discharge from the hospital within 2 midnights of admission.   Time spent:  50 minutes  Author:  Vernelle Emerald MD  09/28/2022 10:22 PM

## 2022-09-29 DIAGNOSIS — E876 Hypokalemia: Secondary | ICD-10-CM | POA: Diagnosis not present

## 2022-09-29 DIAGNOSIS — R112 Nausea with vomiting, unspecified: Secondary | ICD-10-CM | POA: Diagnosis not present

## 2022-09-29 DIAGNOSIS — K219 Gastro-esophageal reflux disease without esophagitis: Secondary | ICD-10-CM | POA: Diagnosis not present

## 2022-09-29 DIAGNOSIS — E1010 Type 1 diabetes mellitus with ketoacidosis without coma: Secondary | ICD-10-CM | POA: Diagnosis not present

## 2022-09-29 LAB — COMPREHENSIVE METABOLIC PANEL
ALT: 36 U/L (ref 0–44)
AST: 25 U/L (ref 15–41)
Albumin: 3.5 g/dL (ref 3.5–5.0)
Alkaline Phosphatase: 43 U/L (ref 38–126)
Anion gap: 14 (ref 5–15)
BUN: 5 mg/dL — ABNORMAL LOW (ref 6–20)
CO2: 22 mmol/L (ref 22–32)
Calcium: 8.7 mg/dL — ABNORMAL LOW (ref 8.9–10.3)
Chloride: 103 mmol/L (ref 98–111)
Creatinine, Ser: 0.61 mg/dL (ref 0.61–1.24)
GFR, Estimated: >60 mL/min (ref >60)
Glucose, Bld: 174 mg/dL — ABNORMAL HIGH (ref 70–99)
Potassium: 3.8 mmol/L (ref 3.5–5.1)
Sodium: 139 mmol/L (ref 135–145)
Total Bilirubin: 3.3 mg/dL — ABNORMAL HIGH (ref 0.3–1.2)
Total Protein: 6.2 g/dL — ABNORMAL LOW (ref 6.5–8.1)

## 2022-09-29 LAB — CBC WITH DIFFERENTIAL/PLATELET
Abs Immature Granulocytes: 0 10*3/uL (ref 0.00–0.07)
Basophils Absolute: 0 10*3/uL (ref 0.0–0.1)
Basophils Relative: 0 %
Eosinophils Absolute: 0.1 10*3/uL (ref 0.0–0.5)
Eosinophils Relative: 2 %
HCT: 40 % (ref 39.0–52.0)
Hemoglobin: 13.1 g/dL (ref 13.0–17.0)
Immature Granulocytes: 0 %
Lymphocytes Relative: 43 %
Lymphs Abs: 1.5 10*3/uL (ref 0.7–4.0)
MCH: 27.5 pg (ref 26.0–34.0)
MCHC: 32.8 g/dL (ref 30.0–36.0)
MCV: 83.9 fL (ref 80.0–100.0)
Monocytes Absolute: 0.4 10*3/uL (ref 0.1–1.0)
Monocytes Relative: 12 %
Neutro Abs: 1.5 10*3/uL — ABNORMAL LOW (ref 1.7–7.7)
Neutrophils Relative %: 43 %
Platelets: 240 10*3/uL (ref 150–400)
RBC: 4.77 MIL/uL (ref 4.22–5.81)
RDW: 12.4 % (ref 11.5–15.5)
WBC: 3.6 10*3/uL — ABNORMAL LOW (ref 4.0–10.5)
nRBC: 0 % (ref 0.0–0.2)

## 2022-09-29 LAB — HEMOGLOBIN A1C
Hgb A1c MFr Bld: 10.6 % — ABNORMAL HIGH (ref 4.8–5.6)
Mean Plasma Glucose: 258 mg/dL

## 2022-09-29 LAB — GLUCOSE, CAPILLARY
Glucose-Capillary: 166 mg/dL — ABNORMAL HIGH (ref 70–99)
Glucose-Capillary: 220 mg/dL — ABNORMAL HIGH (ref 70–99)
Glucose-Capillary: 184 mg/dL — ABNORMAL HIGH (ref 70–99)

## 2022-09-29 LAB — MAGNESIUM: Magnesium: 2.1 mg/dL (ref 1.7–2.4)

## 2022-09-29 MED ORDER — METOCLOPRAMIDE HCL 10 MG PO TABS: 10.0000 mg | ORAL_TABLET | Freq: Three times a day (TID) | ORAL | 3 refills | Status: DC | PRN

## 2022-09-29 MED ORDER — PANTOPRAZOLE SODIUM 40 MG PO TBEC: 40.0000 mg | DELAYED_RELEASE_TABLET | Freq: Every day | ORAL | 1 refills | Status: DC

## 2022-09-29 NOTE — Inpatient Diabetes Management (Addendum)
Inpatient Diabetes Program Recommendations  AACE/ADA: New Consensus Statement on Inpatient Glycemic Control (2015)  Target Ranges:  Prepandial:   less than 140 mg/dL      Peak postprandial:   less than 180 mg/dL (1-2 hours)      Critically ill patients:  140 - 180 mg/dL    Latest Reference Range & Units 09/28/22 07:45 09/28/22 09:56 09/28/22 12:37 09/28/22 17:31 09/28/22 19:37 09/28/22 21:45  Glucose-Capillary 70 - 99 mg/dL 217 (H)  3 units Novolog  5 units Semglee _0  207 (H)  4 units Novolog  183 (H)  2 units Novolog _1  161 (H)  2 units Novolog  178 (H) 171 (H)  2 units Novolog  10 units Semglee _2   (H): Data is abnormally high  Latest Reference Range & Units 09/29/22 07:41  Glucose-Capillary 70 - 99 mg/dL 166 (H)  (H): Data is abnormally high    Admit with: DKA   History: Type 1 diabetes (Needs Basal, Correction, and Meal Coverage Insulin)   Home DM Meds: Dexcom CGM                             Omnipod Insulin Pump   Current Orders: Novolog 0-10 units TID ac/hs                           Semglee 10 units BID    Met w/ pt at bedside this AM.  Discussed with pt that I called his ENDO office yest and that the ENDO office sent a Rx for Humalog vial to be picked up after discharge to the [facility] on [address].  Instructed pt to NOT put any more Lantus NOR Lyumjev into his Omni Pod Insulin pump and to only use the Humalog insulin from here forth.  Pt knows to call ENDO office if he needs more refills on the Humalog vials.  I further discussed with pt that we are giving him Semglee insulin (basal insulin) here in hospital (10 units BID) which is a lower dose than the amount of basal insulin he gets on his insulin pump Q24H.  Pt just got 10 units Semglee insulin this AM.  If pt goes home later today, pt knows to keep his insulin pump off until about 10pm tonight--Can use his Lyumjev insulin pens to cover his food intake if he goes home later  today--Pt understands that with the Semglee insulin on board, he could have some Hypoglycemia once he resumes his basal rates on his pump, but that hopefully, the lower dose of Semglee insulin will not cause HYPO issues.  Asked pt to keep a close eye on his Dexcom CGM and if he has issues with HYPO while the Semlgee is leaving his system, then he can temporarily suspend the basal rates on his pump if needed.   ENDO: Dr. Cruzita Lederer with [facility] Looks like the Omni Pod 5 Insulin Pump was started 08/18/2022 Settings are as follows: Basal settings Basal rates 1.25 u/hr Bolus settings Target Blood glucose  140 mg/dL Insulin to carb (IC) ratio 1 (Bolus 7.5-10.5 units per meal and 3-4 units per snack) Correction factor  28 for BG above 140 Duration of insulin action 4 Prior to starting Insulin Pump, pt was supposed to be taking the following: Lantus 34 units Daily Lyumjev 8-12 units TID with meals (depending on meal size) Lyumjev sliding scale: - 150-175: + 1 unit  -  176-200: + 2 units  - 201-225: + 3 units  - 226-250: + 4 units  - 251-275: + 5 units - 276-300: + 6 units >300: + 7 units       --Will follow patient during hospitalization--   Wyn Quaker RN, MSN, [place] Diabetes Coordinator Inpatient Glycemic Control Team Team Pager: [phone] (8a-5p)

## 2022-09-29 NOTE — Discharge Summary (Signed)
Physician Discharge Summary   Patient: Edward Walls MRN: [id number] DOB: Sep 17, 1981  Admit date:     09/27/2022  Discharge date: 09/29/22  Discharge Physician: Vernelle Emerald   PCP: [name], MD   Recommendations at discharge:   Patient was instructed to resume his use of his insulin pump as previously instructed by his endocrinologist.  Patient was instructed to be extremely careful and pay attention to what type of insulin he is loading into the pump. Patient has been instructed to consume a low carbohydrate diet Patient has been instructed to follow-up closely with his endocrinologist and primary care provider Patient has been instructed to seek medical attention if his blood sugars remain less than 70 despite taking juice, blood sugars greater than 400 despite taking additional insulin, weakness, nausea, vomiting or fevers greater than 100.5 F. Finally, patient has been instructed to inquire with his outpatient provider obtaining a gastric emptying study due to concerns for diabetic gastroparesis.  Discharge Diagnoses: Principal Problem:   Diabetic ketoacidosis without coma associated with type 1 diabetes mellitus ([place]) Active Problems:   Intractable nausea and vomiting   GERD without esophagitis   Hypokalemia due to excessive gastrointestinal loss of potassium  Resolved Problems:   * No resolved hospital problems. *   Hospital Course: 41 year old male with past medical history of diabetes mellitus type 1 with history of poor glycemic control and frequent episodes of diabetic ketoacidosis, gastroesophageal reflux disease presenting to [facility] emergency room with complaints of nausea and vomiting over approximately 1 week.  Patient reported loading a long-acting insulin into his insulin pump accidentally resulting in the onset of his symptoms and elevated blood sugars.  Upon evaluation in the emergency department patient was found to be in diabetic  ketoacidosis.  The hospitalist group was then called to assess the patient for admission to the hospital.  Upon hospitalization, patient's anion gap quickly closed and patient was transition to subcutaneous insulin.  Patient was aggressively hydrated throughout the hospitalization with intravenous isotonic fluids.  Concerning the patient's intractable nausea and vomiting.  Patient was initially n.p.o. and managed with a combination of both as needed Zofran and Reglan.  As the patient's glycemic control improved and frequent doses of antiemetics were administered patient's symptoms gradually improved.  A clear liquid diet was then initiated and diet was advanced as tolerated.  Considering patient's longstanding history of extremely poorly controlled diabetes it was felt the patient was likely suffering from diabetic gastroparesis.  Patient was instructed to inquire upon following up with his outpatient providers about obtaining a gastric emptying study for confirmation testing.  Patient was discharged home in improved and stable condition on 12/15.  Patient was instructed to resume use of his insulin pump exactly as instructed by his endocrinologist.   Consultants: None Procedures performed: None  Disposition: Home Diet recommendation:  Discharge Diet Orders (From admission, onward)     Start     Ordered   09/29/22 0000  Diet - low sodium heart healthy        09/29/22 1635           Carb modified diet  DISCHARGE MEDICATION: Allergies as of 09/29/2022       Reactions   Other Other (See Comments)   All nuts Throat pain   Peanut-containing Drug Products Other (See Comments)   Throat pain        Medication List     STOP taking these medications    HumaLOG 100 UNIT/ML injection  Generic drug: insulin lispro   Lantus SoloStar 100 UNIT/ML Solostar Pen Generic drug: insulin glargine       TAKE these medications    Dexcom G6 Transmitter Misc 1 Device by Does not apply  route every 3 (three) months.   Dexcom G6 Transmitter Misc 1 Device by Does not apply route every 3 (three) months.   docusate sodium 100 MG capsule Commonly known as: COLACE Take 1 capsule (100 mg total) by mouth 2 (two) times daily as needed for mild constipation.   FreeStyle Libre 2 Sensor Misc CHANGE SENSOR EVERY 14 DAYS   Dexcom G6 Sensor Misc Replace every 10 days   gabapentin 300 MG capsule Commonly known as: NEURONTIN Take 2 capsules (600 mg total) by mouth 3 (three) times daily.   Insulin Pen Needle 32G X 4 MM Misc Use 4-5x a day   Lyumjev 100 UNIT/ML Soln Generic drug: Insulin Lispro-aabc Inject 80 Units into the skin daily. Via pump. What changed: Another medication with the same name was removed. Continue taking this medication, and follow the directions you see here.   metoCLOPramide 10 MG tablet Commonly known as: Reglan Take 1 tablet (10 mg total) by mouth 3 (three) times daily as needed for nausea.   Omnipod 5 G6 Pod (Gen 5) Misc 1 each by Does not apply route every 3 (three) days.   Omnipod 5 G6 Intro (Gen 5) Kit 1 each by Does not apply route as needed.   pantoprazole 40 MG tablet Commonly known as: Protonix Take 1 tablet (40 mg total) by mouth daily.   polyethylene glycol 17 g packet Commonly known as: MIRALAX / GLYCOLAX Take 17 g by mouth daily as needed for moderate constipation.        Follow-up Information     [name], MD. Schedule an appointment as soon as possible for a visit in 1 week(s).   Specialty: Family Medicine Contact information: [place] [address] [phone]         Philemon Kingdom, MD. Go on 11/02/2022.   Specialty: Internal Medicine Why: 3PM Contact information: [address] [place] [phone]                 Discharge Exam: Danley Danker Weights   09/29/22 1417  Weight: 64.4 kg    Constitutional: Awake alert and oriented  x3, no associated distress.   Respiratory: clear to auscultation bilaterally, no wheezing, no crackles. Normal respiratory effort. No accessory muscle use.  Cardiovascular: Regular rate and rhythm, no murmurs / rubs / gallops. No extremity edema. 2+ pedal pulses. No carotid bruits.  Abdomen: Abdomen is soft and nontender.  No evidence of intra-abdominal masses.  Positive bowel sounds noted in all quadrants.   Musculoskeletal: No joint deformity upper and lower extremities. Good ROM, no contractures. Normal muscle tone.     Condition at discharge: fair  The results of significant diagnostics from this hospitalization (including imaging, microbiology, ancillary and laboratory) are listed below for reference.   Imaging Studies: CT ABDOMEN PELVIS W CONTRAST  Result Date: 09/27/2022 CLINICAL DATA:  Acute abdominal pain with persistent vomiting. EXAM: CT ABDOMEN AND PELVIS WITH CONTRAST TECHNIQUE: Multidetector CT imaging of the abdomen and pelvis was performed using the standard protocol following bolus administration of intravenous contrast. RADIATION DOSE REDUCTION: This exam was performed according to the departmental dose-optimization program which includes automated exposure control, adjustment of the mA and/or kV according to patient size and/or use of iterative  reconstruction technique. CONTRAST:  176m OMNIPAQUE IOHEXOL 300 MG/ML  SOLN COMPARISON:  None Available. FINDINGS: Lower chest: No basilar airspace disease or pleural effusion. Hepatobiliary: No focal liver abnormality is seen. No gallstones, gallbladder wall thickening, or biliary dilatation. Pancreas: No ductal dilatation or inflammation. Spleen: Normal in size without focal abnormality. Adrenals/Urinary Tract: Normal adrenal glands. No hydronephrosis or perinephric edema. Homogeneous renal enhancement. Urinary bladder is physiologically distended, equivocal bladder wall thickening. Stomach/Bowel: Detailed bowel assessment is limited in the  absence of enteric contrast and paucity of intra-abdominal fat. Minimal wall thickening at the gastroesophageal junction. Physiologic stomach distention with small amount of intraluminal fluid. No obvious gastric wall thickening or hyperemia. Scattered fluid-filled small bowel in the central and lower abdomen, no obstruction. No evidence of bowel inflammation. The appendix is not well-defined, no evidence of appendicitis. Small volume of colonic stool. Vascular/Lymphatic: Normal caliber abdominal aorta. Patent portal vein. No acute vascular findings. No abdominopelvic adenopathy. Reproductive: Prostate is unremarkable. Other: No free air or ascites. Musculoskeletal: There are no acute or suspicious osseous abnormalities. IMPRESSION: 1. No bowel obstruction. Scattered fluid throughout the stomach and small bowel without abnormal distension or inflammation. 2. Minimal wall thickening at the gastroesophageal junction, can be seen with reflux or esophagitis. 3. Equivocal bladder wall thickening, recommend correlation with urinalysis to exclude urinary tract infection. Electronically Signed   By: MKeith RakeM.D.   On: 09/27/2022 21:08    Microbiology: Results for orders placed or performed during the hospital encounter of 05/24/22  MRSA Next Gen by PCR, Nasal     Status: None   Collection Time: 05/24/22  3:14 PM   Specimen: Nasal Mucosa; Nasal Swab  Result Value Ref Range Status   MRSA by PCR Next Gen NOT DETECTED NOT DETECTED Final    Comment: (NOTE) The GeneXpert MRSA Assay (FDA approved for NASAL specimens only), is one component of a comprehensive MRSA colonization surveillance program. It is not intended to diagnose MRSA infection nor to guide or monitor treatment for MRSA infections. Test performance is not FDA approved in patients less than 239years old. [facility] [address] [place] [address]    Labs: CBC: Recent Labs  Lab  09/27/22 1702 09/28/22 0434 09/29/22 0852  WBC 5.0 4.4 3.6*  NEUTROABS 3.3  --  1.5*  HGB 13.7 11.4* 13.1  HCT 42.3 35.7* 40.0  MCV 86.5 86.2 83.9  PLT 243 199 2809  Basic Metabolic Panel: Recent Labs  Lab 09/27/22 1702 09/27/22 2135 09/28/22 0835 09/28/22 1719 09/29/22 0511  NA 136 136 135 135 139  K 3.8 3.0* 3.9 3.4* 3.8  CL 98 107 109 106 103  CO2 20* 17* 15* 20* 22  GLUCOSE 192* 229* 229* 163* 174*  BUN _0 5*  CREATININE 0.85 0.69 0.62 0.46* 0.61  CALCIUM 8.4* 6.9* 6.7* 7.0* 8.7*  MG  --   --  2.1  --  2.1  PHOS  --   --  2.5  --   --    Liver Function Tests: Recent Labs  Lab 09/27/22 1702 09/29/22 0511  AST 28 25  ALT 43 36  ALKPHOS 41 43  BILITOT 3.0* 3.3*  PROT 6.2* 6.2*  ALBUMIN 3.5 3.5   CBG: Recent Labs  Lab 09/28/22 1937 09/28/22 2145 09/29/22 0741 09/29/22 1159 09/29/22 1737  GLUCAP 178* 171* 166* 220* 184*    Discharge time spent: greater than 30 minutes.  Signed: GVernelle Emerald  MD [facility] 09/29/2022

## 2022-09-29 NOTE — Discharge Instructions (Addendum)
Please return to the ED or contact your provider if for sugars that are below 70 that do not come up with juice/sugar, sugars that remain above 400 without insulin, weakness, inability to keep food down or fever in excess of 100.68F. Please follow up with your primary care provider and endocrinologist on your regularly scheduled appointment of 1/18 Please use your insulin pump exactly as directed with the appropriate insulin Please follow-up with your primary care provider in 1 to 2 weeks Consume a low carbohydrate diet Inquire with your outpatient provider about obtaining a gastric emptying study as an outpatient.

## 2022-09-29 NOTE — Progress Notes (Signed)
  Transition of Care [facility]) Screening Note   Patient Details  Name: [name] Date of Birth: 04/15/1981   [facility] CM/SW Contact:    Adrian Prows, RN Phone Number: [phone] 09/29/2022, 11:24 AM    Transition of Care Department [facility]) has reviewed patient and no TOC needs have been identified at this time. We will continue to monitor patient advancement through interdisciplinary progression rounds. If new patient transition needs arise, please place a TOC consult.

## 2022-09-29 NOTE — Plan of Care (Signed)
  Problem: Coping: Goal: Ability to adjust to condition or change in health will improve Outcome: Adequate for Discharge   Problem: Health Behavior/Discharge Planning: Goal: Ability to manage health-related needs will improve Outcome: Adequate for Discharge   Problem: Clinical Measurements: Goal: Ability to maintain clinical measurements within normal limits will improve Outcome: Adequate for Discharge

## 2022-10-02 ENCOUNTER — Telehealth: Payer: Self-pay

## 2022-10-02 NOTE — Telephone Encounter (Signed)
Transition Care Management Unsuccessful Follow-up Telephone Call  Date of discharge and from where:  09/29/22 [facility]. Dx: DKA aossicated w/ T1DM.  Attempts:  1st Attempt  Reason for unsuccessful TCM follow-up call:  Left voice message

## 2022-10-24 NOTE — Progress Notes (Unsigned)
10/24/2022 Edward Walls [id number] [birth year]  Referring provider: Mliss Sax,* Primary GI doctor: {acdocs:27040}  ASSESSMENT AND PLAN:   There are no diagnoses linked to this encounter.   Patient Care Team: Mliss Sax, MD as PCP - General (Family Medicine)  HISTORY OF PRESENT ILLNESS: 42 y.o. male with a past medical history of type 1 diabetes with history of diabetic ketoacidosis, neuropathy, alcohol abuse, gastroparesis, GERD and others listed below presents for evaluation of nausea, vomiting and weight loss.  Patient is insulin-dependent diabetic. Has a prescription for Reglan, on Protonix, Colace and MiraLAX Most recent labs 09/29/2022 showed WBC 3.6 hemoglobin 13.1, platelets 240, glucose 174, 09/28/2022 hemoglobin 11.4, medic at 35.7. 09/27/2022 CT abdomen and pelvis with contrast for abdominal pain and persistent vomiting showed no bowel obstruction.  Minimal wall thickening at the gastroesophageal junction with reflux or esophagitis 09/29/2022 A1c 10.6 09/27/2022 positive for [facility]  He  reports that he has quit smoking. His smoking use included cigarettes. He has never used smokeless tobacco. He reports current alcohol use. He reports that he does not use drugs.  Current Medications:   Current Outpatient Medications (Endocrine & Metabolic):    Insulin Lispro-aabc (LYUMJEV) 100 UNIT/ML SOLN, Inject 80 Units into the skin daily. Via pump.      Current Outpatient Medications (Other):    Continuous Blood Gluc Sensor (DEXCOM G6 SENSOR) MISC, Replace every 10 days   Continuous Blood Gluc Sensor (FREESTYLE LIBRE 2 SENSOR) MISC, CHANGE SENSOR EVERY 14 DAYS   Continuous Blood Gluc Transmit (DEXCOM G6 TRANSMITTER) MISC, 1 Device by Does not apply route every 3 (three) months.   Continuous Blood Gluc Transmit (DEXCOM G6 TRANSMITTER) MISC, 1 Device by Does not apply route every 3 (three) months.   docusate sodium (COLACE) 100 MG capsule, Take 1  capsule (100 mg total) by mouth 2 (two) times daily as needed for mild constipation.   gabapentin (NEURONTIN) 300 MG capsule, Take 2 capsules (600 mg total) by mouth 3 (three) times daily.   Insulin Disposable Pump (OMNIPOD 5 G6 INTRO, GEN 5,) KIT, 1 each by Does not apply route as needed.   Insulin Disposable Pump (OMNIPOD 5 G6 POD, GEN 5,) MISC, 1 each by Does not apply route every 3 (three) days.   Insulin Pen Needle 32G X 4 MM MISC, Use 4-5x a day   metoCLOPramide (REGLAN) 10 MG tablet, Take 1 tablet (10 mg total) by mouth 3 (three) times daily as needed for nausea.   pantoprazole (PROTONIX) 40 MG tablet, Take 1 tablet (40 mg total) by mouth daily.   polyethylene glycol (MIRALAX / GLYCOLAX) 17 g packet, Take 17 g by mouth daily as needed for moderate constipation.  Medical History:  Past Medical History:  Diagnosis Date   Diabetes mellitus without complication (HCC)    Leg pain    Leg weakness    Seasonal allergies    Allergies:  Allergies  Allergen Reactions   Other Other (See Comments)    All nuts Throat pain   Peanut-Containing Drug Products Other (See Comments)    Throat pain     Surgical History:  He  has a past surgical history that includes Dental surgery. Family History:  His family history includes Diabetes Mellitus II in his brother, father, and mother.  REVIEW OF SYSTEMS  : All other systems reviewed and negative except where noted in the History of Present Illness.  PHYSICAL EXAM: There were no vitals taken for this visit. General:  Pleasant, well developed male in no acute distress Head:   Normocephalic and atraumatic. Eyes:  sclerae anicteric,conjunctive pink  Heart:   {HEART EXAM HEM/ONC:21750} Pulm:  Clear anteriorly; no wheezing Abdomen:   {BlankSingle:19197::"Distended","Ridged","Soft"}, {BlankSingle:19197::"Flat","Obese","Non-distended"} AB, {BlankSingle:19197::"Absent","Hyperactive, tinkling","Hypoactive","Sluggish","Active"} bowel sounds.  {actendernessAB:27319} tenderness {anatomy; site abdomen:5010}. {BlankMultiple:19196::"Without guarding","With guarding","Without rebound","With rebound"}, No organomegaly appreciated. Rectal: {acrectalexam:27461} Extremities:  {With/Without:304960234} edema. Msk: Symmetrical without gross deformities. Peripheral pulses intact.  Neurologic:  Alert and  oriented x4;  No focal deficits.  Skin:   Dry and intact without significant lesions or rashes. Psychiatric:  Cooperative. Normal mood and affect.  RELEVANT LABS AND IMAGING: CBC    Component Value Date/Time   WBC 3.6 (L) 09/29/2022 0852   RBC 4.77 09/29/2022 0852   HGB 13.1 09/29/2022 0852   HCT 40.0 09/29/2022 0852   PLT 240 09/29/2022 0852   MCV 83.9 09/29/2022 0852   MCH 27.5 09/29/2022 0852   MCHC 32.8 09/29/2022 0852   RDW 12.4 09/29/2022 0852   LYMPHSABS 1.5 09/29/2022 0852   MONOABS 0.4 09/29/2022 0852   EOSABS 0.1 09/29/2022 0852   BASOSABS 0.0 09/29/2022 0852    CMP     Component Value Date/Time   NA 139 09/29/2022 0511   K 3.8 09/29/2022 0511   CL 103 09/29/2022 0511   CO2 22 09/29/2022 0511   GLUCOSE 174 (H) 09/29/2022 0511   BUN 5 (L) 09/29/2022 0511   CREATININE 0.61 09/29/2022 0511   CREATININE 0.96 12/12/2018 0846   CALCIUM 8.7 (L) 09/29/2022 0511   PROT 6.2 (L) 09/29/2022 0511   PROT 6.8 12/14/2020 0830   ALBUMIN 3.5 09/29/2022 0511   AST 25 09/29/2022 0511   ALT 36 09/29/2022 0511   ALKPHOS 43 09/29/2022 0511   BILITOT 3.3 (H) 09/29/2022 0511   GFRNONAA >60 09/29/2022 0511   GFRNONAA 102 12/12/2018 0846   GFRAA >60 06/07/2020 0543   GFRAA 118 12/12/2018 [name], PA-C 1:41 PM

## 2022-10-25 ENCOUNTER — Other Ambulatory Visit: Payer: Self-pay

## 2022-10-25 ENCOUNTER — Emergency Department (HOSPITAL_COMMUNITY): Payer: Managed Care, Other (non HMO)

## 2022-10-25 ENCOUNTER — Encounter (HOSPITAL_COMMUNITY): Payer: Self-pay

## 2022-10-25 ENCOUNTER — Inpatient Hospital Stay (HOSPITAL_COMMUNITY)
Admission: EM | Admit: 2022-10-25 | Discharge: 2022-10-28 | DRG: 073 | Disposition: A | Discharge status: Home / Self Care | Payer: Managed Care, Other (non HMO) | Source: Ambulatory Visit | Attending: Internal Medicine | Admitting: Internal Medicine

## 2022-10-25 ENCOUNTER — Ambulatory Visit (INDEPENDENT_AMBULATORY_CARE_PROVIDER_SITE_OTHER): Payer: Managed Care, Other (non HMO) | Admitting: Physician Assistant

## 2022-10-25 ENCOUNTER — Encounter: Payer: Self-pay | Admitting: Physician Assistant

## 2022-10-25 VITALS — BP 100/60 | HR 80 | Ht 75.0 in | Wt 139.0 lb

## 2022-10-25 DIAGNOSIS — Z91010 Allergy to peanuts: Secondary | ICD-10-CM

## 2022-10-25 DIAGNOSIS — R112 Nausea with vomiting, unspecified: Secondary | ICD-10-CM

## 2022-10-25 DIAGNOSIS — E1043 Type 1 diabetes mellitus with diabetic autonomic (poly)neuropathy: Secondary | ICD-10-CM | POA: Diagnosis not present

## 2022-10-25 DIAGNOSIS — Z87891 Personal history of nicotine dependence: Secondary | ICD-10-CM

## 2022-10-25 DIAGNOSIS — E876 Hypokalemia: Secondary | ICD-10-CM | POA: Diagnosis present

## 2022-10-25 DIAGNOSIS — Z91018 Allergy to other foods: Secondary | ICD-10-CM

## 2022-10-25 DIAGNOSIS — Z794 Long term (current) use of insulin: Secondary | ICD-10-CM

## 2022-10-25 DIAGNOSIS — K219 Gastro-esophageal reflux disease without esophagitis: Secondary | ICD-10-CM

## 2022-10-25 DIAGNOSIS — E860 Dehydration: Secondary | ICD-10-CM | POA: Diagnosis present

## 2022-10-25 DIAGNOSIS — R634 Abnormal weight loss: Secondary | ICD-10-CM | POA: Diagnosis not present

## 2022-10-25 DIAGNOSIS — F1011 Alcohol abuse, in remission: Secondary | ICD-10-CM | POA: Diagnosis present

## 2022-10-25 DIAGNOSIS — K3184 Gastroparesis: Secondary | ICD-10-CM

## 2022-10-25 DIAGNOSIS — Z79899 Other long term (current) drug therapy: Secondary | ICD-10-CM

## 2022-10-25 DIAGNOSIS — Z681 Body mass index (BMI) 19 or less, adult: Secondary | ICD-10-CM

## 2022-10-25 DIAGNOSIS — E785 Hyperlipidemia, unspecified: Secondary | ICD-10-CM | POA: Diagnosis present

## 2022-10-25 DIAGNOSIS — R066 Hiccough: Secondary | ICD-10-CM | POA: Diagnosis present

## 2022-10-25 DIAGNOSIS — R002 Palpitations: Secondary | ICD-10-CM

## 2022-10-25 DIAGNOSIS — E1010 Type 1 diabetes mellitus with ketoacidosis without coma: Secondary | ICD-10-CM | POA: Diagnosis not present

## 2022-10-25 DIAGNOSIS — E43 Unspecified severe protein-calorie malnutrition: Secondary | ICD-10-CM | POA: Insufficient documentation

## 2022-10-25 DIAGNOSIS — K5900 Constipation, unspecified: Secondary | ICD-10-CM | POA: Diagnosis present

## 2022-10-25 DIAGNOSIS — Z9641 Presence of insulin pump (external) (internal): Secondary | ICD-10-CM | POA: Diagnosis present

## 2022-10-25 DIAGNOSIS — Z833 Family history of diabetes mellitus: Secondary | ICD-10-CM

## 2022-10-25 DIAGNOSIS — K2100 Gastro-esophageal reflux disease with esophagitis, without bleeding: Secondary | ICD-10-CM | POA: Diagnosis present

## 2022-10-25 LAB — CBC WITH DIFFERENTIAL/PLATELET
Abs Immature Granulocytes: 0.01 10*3/uL (ref 0.00–0.07)
Basophils Absolute: 0 10*3/uL (ref 0.0–0.1)
Basophils Relative: 0 %
Eosinophils Absolute: 0 10*3/uL (ref 0.0–0.5)
Eosinophils Relative: 1 %
HCT: 48.4 % (ref 39.0–52.0)
Hemoglobin: 15.7 g/dL (ref 13.0–17.0)
Immature Granulocytes: 0 %
Lymphocytes Relative: 39 %
Lymphs Abs: 1.7 10*3/uL (ref 0.7–4.0)
MCH: 27.3 pg (ref 26.0–34.0)
MCHC: 32.4 g/dL (ref 30.0–36.0)
MCV: 84.2 fL (ref 80.0–100.0)
Monocytes Absolute: 0.4 10*3/uL (ref 0.1–1.0)
Monocytes Relative: 8 %
Neutro Abs: 2.3 10*3/uL (ref 1.7–7.7)
Neutrophils Relative %: 52 %
Platelets: 371 10*3/uL (ref 150–400)
RBC: 5.75 MIL/uL (ref 4.22–5.81)
RDW: 12.6 % (ref 11.5–15.5)
WBC: 4.4 10*3/uL (ref 4.0–10.5)
nRBC: 0 % (ref 0.0–0.2)

## 2022-10-25 LAB — URINALYSIS, ROUTINE W REFLEX MICROSCOPIC
Bacteria, UA: NONE SEEN
Bilirubin Urine: NEGATIVE
Glucose, UA: NEGATIVE mg/dL
Hgb urine dipstick: NEGATIVE
Ketones, ur: 20 mg/dL — AB
Leukocytes,Ua: NEGATIVE
Nitrite: NEGATIVE
Protein, ur: 30 mg/dL — AB
Specific Gravity, Urine: 1.023 (ref 1.005–1.030)
pH: 5 (ref 5.0–8.0)

## 2022-10-25 LAB — COMPREHENSIVE METABOLIC PANEL
ALT: 22 U/L (ref 0–44)
AST: 21 U/L (ref 15–41)
Albumin: 4.3 g/dL (ref 3.5–5.0)
Alkaline Phosphatase: 54 U/L (ref 38–126)
Anion gap: 12 (ref 5–15)
BUN: 19 mg/dL (ref 6–20)
CO2: 26 mmol/L (ref 22–32)
Calcium: 9 mg/dL (ref 8.9–10.3)
Chloride: 97 mmol/L — ABNORMAL LOW (ref 98–111)
Creatinine, Ser: 0.91 mg/dL (ref 0.61–1.24)
GFR, Estimated: >60 mL/min (ref >60)
Glucose, Bld: 179 mg/dL — ABNORMAL HIGH (ref 70–99)
Potassium: 3 mmol/L — ABNORMAL LOW (ref 3.5–5.1)
Sodium: 135 mmol/L (ref 135–145)
Total Bilirubin: 4.3 mg/dL — ABNORMAL HIGH (ref 0.3–1.2)
Total Protein: 7.6 g/dL (ref 6.5–8.1)

## 2022-10-25 LAB — CBG MONITORING, ED
Glucose-Capillary: 185 mg/dL — ABNORMAL HIGH (ref 70–99)
Glucose-Capillary: 186 mg/dL — ABNORMAL HIGH (ref 70–99)

## 2022-10-25 LAB — LIPASE, BLOOD: Lipase: 28 U/L (ref 11–51)

## 2022-10-25 LAB — TROPONIN I (HIGH SENSITIVITY): Troponin I (High Sensitivity): 4 ng/L (ref <18)

## 2022-10-25 LAB — GLUCOSE, CAPILLARY: Glucose-Capillary: 180 mg/dL — ABNORMAL HIGH (ref 70–99)

## 2022-10-25 MED ORDER — METOCLOPRAMIDE HCL 5 MG/ML IJ SOLN
10.0000 mg | Freq: Once | INTRAMUSCULAR | Status: AC
  Administered 2022-10-25: 10 mg via INTRAVENOUS
  Filled 2022-10-25: qty 2

## 2022-10-25 MED ORDER — MAGNESIUM OXIDE -MG SUPPLEMENT 400 (240 MG) MG PO TABS
800.0000 mg | ORAL_TABLET | Freq: Once | ORAL | Status: AC
  Administered 2022-10-25: 800 mg via ORAL
  Filled 2022-10-25: qty 2

## 2022-10-25 MED ORDER — ONDANSETRON HCL 4 MG/2ML IJ SOLN
4.0000 mg | Freq: Once | INTRAMUSCULAR | Status: AC
  Administered 2022-10-25: 4 mg via INTRAVENOUS
  Filled 2022-10-25: qty 2

## 2022-10-25 MED ORDER — METOCLOPRAMIDE HCL 5 MG/ML IJ SOLN
10.0000 mg | Freq: Four times a day (QID) | INTRAMUSCULAR | Status: DC
  Administered 2022-10-25 – 2022-10-28 (×12): 10 mg via INTRAVENOUS
  Filled 2022-10-25 (×12): qty 2

## 2022-10-25 MED ORDER — ACETAMINOPHEN 650 MG RE SUPP: 650.0000 mg | Freq: Four times a day (QID) | RECTAL | Status: DC | PRN

## 2022-10-25 MED ORDER — MAGNESIUM SULFATE 2 GM/50ML IV SOLN: 2.0000 g | Freq: Once | INTRAVENOUS | Status: DC

## 2022-10-25 MED ORDER — DIPHENHYDRAMINE HCL 50 MG/ML IJ SOLN
25.0000 mg | Freq: Once | INTRAMUSCULAR | Status: AC
  Administered 2022-10-25: 25 mg via INTRAVENOUS
  Filled 2022-10-25: qty 1

## 2022-10-25 MED ORDER — HYDROMORPHONE HCL 1 MG/ML IJ SOLN
0.5000 mg | Freq: Once | INTRAMUSCULAR | Status: AC
  Administered 2022-10-25: 0.5 mg via INTRAVENOUS
  Filled 2022-10-25: qty 1

## 2022-10-25 MED ORDER — PROMETHAZINE HCL 25 MG PO TABS: 25.0000 mg | ORAL_TABLET | Freq: Four times a day (QID) | ORAL | 0 refills | Status: DC | PRN

## 2022-10-25 MED ORDER — PANTOPRAZOLE SODIUM 40 MG IV SOLR
40.0000 mg | Freq: Every day | INTRAVENOUS | Status: DC
  Administered 2022-10-25 – 2022-10-28 (×4): 40 mg via INTRAVENOUS
  Filled 2022-10-25 (×4): qty 10

## 2022-10-25 MED ORDER — SODIUM CHLORIDE (PF) 0.9 % IJ SOLN
INTRAMUSCULAR | Status: AC
  Filled 2022-10-25: qty 50

## 2022-10-25 MED ORDER — LACTATED RINGERS IV BOLUS
1000.0000 mL | Freq: Once | INTRAVENOUS | Status: AC
  Administered 2022-10-25: 1000 mL via INTRAVENOUS

## 2022-10-25 MED ORDER — HYDROMORPHONE HCL 1 MG/ML IJ SOLN: 0.5000 mg | INTRAMUSCULAR | Status: DC | PRN

## 2022-10-25 MED ORDER — IOHEXOL 300 MG/ML  SOLN
100.0000 mL | Freq: Once | INTRAMUSCULAR | Status: AC | PRN
  Administered 2022-10-25: 100 mL via INTRAVENOUS

## 2022-10-25 MED ORDER — POTASSIUM CHLORIDE CRYS ER 20 MEQ PO TBCR
40.0000 meq | EXTENDED_RELEASE_TABLET | Freq: Once | ORAL | Status: AC
  Administered 2022-10-25: 40 meq via ORAL
  Filled 2022-10-25: qty 2

## 2022-10-25 MED ORDER — ONDANSETRON HCL 4 MG/2ML IJ SOLN
4.0000 mg | Freq: Four times a day (QID) | INTRAMUSCULAR | Status: DC
  Administered 2022-10-25 – 2022-10-28 (×13): 4 mg via INTRAVENOUS
  Filled 2022-10-25 (×13): qty 2

## 2022-10-25 MED ORDER — ACETAMINOPHEN 325 MG PO TABS: 650.0000 mg | ORAL_TABLET | Freq: Four times a day (QID) | ORAL | Status: DC | PRN

## 2022-10-25 MED ORDER — POTASSIUM CHLORIDE IN NACL 20-0.9 MEQ/L-% IV SOLN
INTRAVENOUS | Status: AC
  Filled 2022-10-25 (×4): qty 1000

## 2022-10-25 NOTE — H&P (Signed)
History and Physical    Patient: Edward Walls TFT:[id number] DOB: 05/16/1981 DOA: 10/25/2022 DOS: the patient was seen and examined on 10/25/2022 PCP: [name], MD  Patient coming from: Home  Chief Complaint:  Chief Complaint  Patient presents with   Abdominal Pain   HPI: Edward Walls is a 42 y.o. male with medical history significant of lower extremity weakness, seasonal allergies, type 1 diabetes mellitus, recently admitted last month for DKA who is coming to the emergency department referred by GI clinic due to dyspnea on exertion for the past month associated with abdominal pain, nausea and vomiting for the past week.  He was found to tachycardic with a blood pressure 100/60 mmHg.  He has been having dyspnea and postural dizziness.  Stated that over the past month he has been having postprandial emesis just a few minutes after eating out associated with epigastric pain, dyspepsia and occasional GE reflux. He thinks he has lost about 20 pounds of weight.  He does not drink alcohol regularly.  He has been using cannabis occasionally, which she said helps with the symptoms.  He has not been using OTC analgesics. No diarrhea, melena or hematochezia.  He has occasional constipation.  No flank pain, dysuria, frequency or hematuria.  No polyuria, polydipsia, polyphagia or blurred vision. He denied fever, chills, rhinorrhea, sore throat, wheezing or hemoptysis.  No chest pain, palpitations, diaphoresis, PND, orthopnea or pitting edema of the lower extremities.   ED course: Initial vital signs were temperature 98 F, pulse 113, respiration 18, BP 127/98 mmHg O2 sat 100% on room air.  The patient received 2000 mL of LR bolus, diphenhydramine 25 mg IVP x 1, ondansetron 4 mg IVP, metoclopramide 10 mg IVP, magnesium oxide 100 mg p.o. x 1 and K-Lor 40 mEq p.o. x 1.  Lab work: His urinalysis was hazy with ketonuria 20 and proteinuria 30 mg/dL.  CBC was normal with a white count of 4.4,  hemoglobin 15.7 g/dL platelets 371.  Lipase was normal.  Troponin 4 ng/L.  CMP showed a potassium 3.0 and chloride 97 mmol/L, total bilirubin 4.3 and glucose 179 mg/dL.  Imaging: 2 view chest radiograph with no active cardiopulmonary disease.  CT abdomen and pelvis with contrast was normal.   Review of Systems: As mentioned in the history of present illness. All other systems reviewed and are negative. Past Medical History:  Diagnosis Date   Diabetes mellitus without complication (HCC)    Leg pain    Leg weakness    Seasonal allergies    Past Surgical History:  Procedure Laterality Date   DENTAL SURGERY     Social History:  reports that he has quit smoking. His smoking use included cigarettes. He has never used smokeless tobacco. He reports current alcohol use. He reports that he does not use drugs.  Allergies  Allergen Reactions   Other Other (See Comments)    All nuts Throat pain   Peanut-Containing Drug Products Other (See Comments)    Throat pain    Family History  Problem Relation Age of Onset   Diabetes Mellitus II Mother    Diabetes Mellitus II Father    Diabetes Mellitus II Brother     Prior to Admission medications   Medication Sig Start Date End Date Taking? Authorizing Provider  promethazine (PHENERGAN) 25 MG tablet Take 1 tablet (25 mg total) by mouth every 6 (six) hours as needed for nausea or vomiting. 10/25/22  Yes Kommor, [place], MD  Continuous Blood Gluc  Sensor (DEXCOM G6 SENSOR) MISC Replace every 10 days 08/11/22   Carlus Pavlov, MD  Continuous Blood Gluc Sensor (FREESTYLE LIBRE 2 SENSOR) MISC CHANGE SENSOR EVERY 14 DAYS 08/04/22   Mliss Sax, MD  Continuous Blood Gluc Transmit (DEXCOM G6 TRANSMITTER) MISC 1 Device by Does not apply route every 3 (three) months. 02/20/22   Carlus Pavlov, MD  Continuous Blood Gluc Transmit (DEXCOM G6 TRANSMITTER) MISC 1 Device by Does not apply route every 3 (three) months. 02/20/22   Carlus Pavlov, MD   docusate sodium (COLACE) 100 MG capsule Take 1 capsule (100 mg total) by mouth 2 (two) times daily as needed for mild constipation. 05/26/22   Marguerita Merles Latif, DO  gabapentin (NEURONTIN) 300 MG capsule Take 2 capsules (600 mg total) by mouth 3 (three) times daily. 06/27/22   Levert Feinstein, MD  Insulin Disposable Pump (OMNIPOD 5 G6 INTRO, GEN 5,) KIT 1 each by Does not apply route as needed. 02/20/22   Carlus Pavlov, MD  Insulin Disposable Pump (OMNIPOD 5 G6 POD, GEN 5,) MISC 1 each by Does not apply route every 3 (three) days. 02/20/22   Carlus Pavlov, MD  Insulin Lispro-aabc (LYUMJEV) 100 UNIT/ML SOLN Inject 80 Units into the skin daily. Via pump. 08/18/22   Carlus Pavlov, MD  Insulin Pen Needle 32G X 4 MM MISC Use 4-5x a day 05/26/22   Marguerita Merles Latif, DO  metoCLOPramide (REGLAN) 10 MG tablet Take 1 tablet (10 mg total) by mouth 3 (three) times daily as needed for nausea. 09/29/22   Shalhoub, Deno Lunger, MD  pantoprazole (PROTONIX) 40 MG tablet Take 1 tablet (40 mg total) by mouth daily. 09/29/22 09/29/23  Shalhoub, Deno Lunger, MD  polyethylene glycol (MIRALAX / GLYCOLAX) 17 g packet Take 17 g by mouth daily as needed for moderate constipation. 05/26/22   Marguerita Merles Lawson, DO    Physical Exam: Vitals:   10/25/22 1130 10/25/22 1200 10/25/22 1230 10/25/22 1354  BP: 103/73 104/73 (!) 119/93 120/70  Pulse: 95  93 (!) 111  Resp: 17  19 (!) 24  Temp:    98.2 F (36.8 C)  TempSrc:    Oral  SpO2: 100%  100% 100%   Physical Exam Vitals and nursing note reviewed.  Constitutional:      General: He is awake. He is not in acute distress.    Appearance: He is well-developed and underweight.  HENT:     Head: Normocephalic.     Nose: No rhinorrhea.     Mouth/Throat:     Mouth: Mucous membranes are dry.     Pharynx: No oropharyngeal exudate.  Eyes:     General: No scleral icterus.    Pupils: Pupils are equal, round, and reactive to light.  Neck:     Vascular: No JVD.  Cardiovascular:      Rate and Rhythm: Regular rhythm. Tachycardia present.     Heart sounds: S1 normal and S2 normal.  Pulmonary:     Effort: Pulmonary effort is normal.     Breath sounds: Normal breath sounds. No wheezing or rhonchi.  Abdominal:     General: Bowel sounds are normal. There is no distension.     Palpations: Abdomen is soft.     Tenderness: There is abdominal tenderness in the epigastric area. There is no right CVA tenderness, left CVA tenderness, guarding or rebound.  Musculoskeletal:     Cervical back: Neck supple.     Right lower leg: No edema.  Left lower leg: No edema.  Skin:    General: Skin is warm and dry.  Neurological:     General: No focal deficit present.     Mental Status: He is alert and oriented to person, place, and time.  Psychiatric:        Mood and Affect: Mood normal.        Behavior: Behavior normal. Behavior is cooperative.   Data Reviewed:  Results are pending, will review when available.  Assessment and Plan: Principal Problem:   Nausea & vomiting In the setting of:   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 ([place]) Observation/MedSurg. Continue IV fluids. Clear liquid diet. Analgesics as needed. Antiemetics per GI orders. Pantoprazole 40 mg IVP daily. Follow CBC, CMP in AM. GI input appreciated.  Active Problems:   GERD without esophagitis Continue PPI as above.    Hypokalemia due to excessive gastrointestinal loss of potassium Continue potassium supplementation. Follow-up potassium level.    Dyslipidemia Currently not on medical therapy. Follow-up with primary care provider.    Hyperbilirubinemia LFTs otherwise WNL. Seen by GI. 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 Follow-up bilirubin level in AM.     Advance Care Planning:   Code Status: Full Code    Consults: [facility] Wilfrid Lund, MD).  Family Communication:   Severity of Illness: The appropriate patient status for this patient is OBSERVATION. Observation  status is judged to be reasonable and necessary in order to provide the required intensity of service to ensure the patient's safety. The patient's presenting symptoms, physical exam findings, and initial radiographic and laboratory data in the context of their medical condition is felt to place them at decreased risk for further clinical deterioration. Furthermore, it is anticipated that the patient will be medically stable for discharge from the hospital within 2 midnights of admission.   Author: Reubin Milan, MD 10/25/2022 2:55 PM  This document was prepared using Dragon voice recognition software and may contain some unintended transcription errors.  For on call review [email or web address].

## 2022-10-25 NOTE — ED Notes (Signed)
ED TO INPATIENT HANDOFF REPORT  ED Nurse Name and Phone #: Delice Bison, RN  S Name/Age/Gender Edward Walls 42 y.o. male Room/Bed: WA22/WA22  Code Status   Code Status: Prior  Home/SNF/Other Home Patient oriented to: self, place, time, and situation Is this baseline? 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 Doctors office checked pt pressure and called EMS for tx.   Allergies Allergies  Allergen Reactions   Other Other (See Comments)    All nuts Throat pain   Peanut-Containing Drug Products Other (See Comments)    Throat pain    Level of Care/Admitting Diagnosis ED Disposition     ED Disposition  Admit   Condition  --   Comment  Hospital Area: [facility] [[id number]]  Level of Care: Telemetry [5]  Admit to tele based on following criteria: Monitor QTC interval  May place patient in observation at [facility] or [facility] if equivalent level of care is available:: No  Covid Evaluation: Asymptomatic - no recent exposure (last 10 days) testing not required  Diagnosis: 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 [facility]) [[id number]]  Admitting Physician: Bobette Mo [[id number]]  Attending Physician: Bobette Mo [[id number]]          B Medical/Surgery History Past Medical History:  Diagnosis Date   Diabetes mellitus without complication (HCC)    Leg pain    Leg weakness    Seasonal allergies    Past Surgical History:  Procedure Laterality Date   DENTAL SURGERY       A IV Location/Drains/Wounds Patient Lines/Drains/Airways Status     Active Line/Drains/Airways     Name Placement date Placement time Site Days   Peripheral IV 10/25/22 20 G 1" Anterior;Proximal;Right Forearm  10/25/22  0949  Forearm  less than 1            Intake/Output Last 24 hours No intake or output data in the 24 hours ending 10/25/22 1529  Labs/Imaging Results for orders placed or performed during the hospital encounter of 10/25/22 (from the past 48 hour(s))  CBC with Differential     Status: None   Collection Time: 10/25/22  9:38 AM  Result Value Ref Range   WBC 4.4 4.0 - 10.5 K/uL   RBC 5.75 4.22 - 5.81 MIL/uL   Hemoglobin 15.7 13.0 - 17.0 g/dL   HCT 29.5 62.1 - 30.8 %   MCV 84.2 80.0 - 100.0 fL   MCH 27.3 26.0 - 34.0 pg   MCHC 32.4 30.0 - 36.0 g/dL   RDW 65.7 84.6 - 96.2 %   Platelets 371 150 - 400 K/uL   nRBC 0.0 0.0 - 0.2 %   Neutrophils Relative % 52 %   Neutro Abs 2.3 1.7 - 7.7 K/uL   Lymphocytes Relative 39 %   Lymphs Abs 1.7 0.7 - 4.0 K/uL   Monocytes Relative 8 %   Monocytes Absolute 0.4 0.1 - 1.0 K/uL   Eosinophils Relative 1 %   Eosinophils Absolute 0.0 0.0 - 0.5 K/uL   Basophils Relative 0 %   Basophils Absolute 0.0 0.0 - 0.1 K/uL   Immature Granulocytes 0 %   Abs Immature Granulocytes 0.01 0.00 - 0.07 K/uL    Comment: Performed at [facility],  [place] [address] [place], [place] [address]  Comprehensive metabolic panel     Status: Abnormal   Collection Time: 10/25/22  9:38 AM  Result Value Ref Range   Sodium 135 135 - 145 mmol/L   Potassium 3.0 (L) 3.5 - 5.1 mmol/L   Chloride 97 (L) 98 - 111 mmol/L   CO2 26 22 - 32 mmol/L   Glucose, Bld 179 (H) 70 - 99 mg/dL    Comment: Glucose reference range applies only to samples taken after fasting for at least 8 hours.   BUN 19 6 - 20 mg/dL   Creatinine, Ser 0.91 0.61 - 1.24 mg/dL   Calcium 9.0 8.9 - 10.3 mg/dL   Total Protein 7.6 6.5 - 8.1 g/dL   Albumin 4.3 3.5 - 5.0 g/dL   AST 21 15 - 41 U/L   ALT 22 0 - 44 U/L   Alkaline Phosphatase 54 38 - 126 U/L   Total Bilirubin 4.3 (H) 0.3 - 1.2 mg/dL   GFR, Estimated >60 >60 mL/min    Comment: (NOTE) Calculated using the CKD-EPI Creatinine  Equation (2021)    Anion gap 12 5 - 15    Comment: Performed at [facility], [place] [address] [place], [place] [address]  Lipase, blood     Status: None   Collection Time: 10/25/22  9:38 AM  Result Value Ref Range   Lipase 28 11 - 51 U/L    Comment: Performed at [facility], [place] [address], [place], [place] [address]  Urinalysis, Routine w reflex microscopic Urine, Clean Catch     Status: Abnormal   Collection Time: 10/25/22  9:38 AM  Result Value Ref Range   Color, Urine YELLOW YELLOW   APPearance HAZY (A) CLEAR   Specific Gravity, Urine 1.023 1.005 - 1.030   pH 5.0 5.0 - 8.0   Glucose, UA NEGATIVE NEGATIVE mg/dL   Hgb urine dipstick NEGATIVE NEGATIVE   Bilirubin Urine NEGATIVE NEGATIVE   Ketones, ur 20 (A) NEGATIVE mg/dL   Protein, ur 30 (A) NEGATIVE mg/dL   Nitrite NEGATIVE NEGATIVE   Leukocytes,Ua NEGATIVE NEGATIVE   RBC / HPF 0-5 0 - 5 RBC/hpf   WBC, UA 11-20 0 - 5 WBC/hpf   Bacteria, UA NONE SEEN NONE SEEN   Squamous Epithelial / HPF 0-5 0 - 5 /HPF   Mucus PRESENT     Comment: Performed at [facility], [place] [address], [place], [place] [address]  POC CBG, ED     Status: Abnormal   Collection Time: 10/25/22  9:43 AM  Result Value Ref Range   Glucose-Capillary 185 (H) 70 - 99 mg/dL    Comment: Glucose reference range applies only to samples taken after fasting for at least 8 hours.  Troponin I (High Sensitivity)     Status: None   Collection Time: 10/25/22  9:50 AM  Result Value Ref Range   Troponin I (High Sensitivity) 4 <18 ng/L    Comment: (NOTE) Elevated high sensitivity troponin I (hsTnI) values and significant  changes across serial measurements may suggest ACS but many other  chronic and acute conditions are known to elevate hsTnI results.  Refer to the "Links" section for chest pain algorithms and additional  guidance. Performed at [facility], [place] [address] [place], [place]  [address]    CT ABDOMEN PELVIS W CONTRAST  Result Date: 10/25/2022 CLINICAL DATA:  Shortness of breath with exertion. Abdominal pain with nausea vomiting. EXAM: CT  ABDOMEN AND PELVIS WITH CONTRAST TECHNIQUE: Multidetector CT imaging of the abdomen and pelvis was performed using the standard protocol following bolus administration of intravenous contrast. RADIATION DOSE REDUCTION: This exam was performed according to the departmental dose-optimization program which includes automated exposure control, adjustment of the mA and/or kV according to patient size and/or use of iterative reconstruction technique. CONTRAST:  197mL OMNIPAQUE IOHEXOL 300 MG/ML  SOLN COMPARISON:  09/27/2022. FINDINGS: Lower chest: Clear lung bases. Hepatobiliary: No focal liver abnormality is seen. No gallstones, gallbladder wall thickening, or biliary dilatation. Pancreas: Unremarkable. No pancreatic ductal dilatation or surrounding inflammatory changes. Spleen: Normal in size without focal abnormality. Adrenals/Urinary Tract: Adrenal glands are unremarkable. Kidneys are normal, without renal calculi, focal lesion, or hydronephrosis. Bladder is unremarkable. Stomach/Bowel: Bowel assessment somewhat limited by lack mesenteric fat. Allowing for this, stomach is unremarkable. Small bowel and colon are normal in caliber. No wall thickening or convincing inflammation. Appendix not definitively seen, but no findings of acute appendicitis. Vascular/Lymphatic: No significant vascular findings are present. No enlarged abdominal or pelvic lymph nodes. Reproductive: Unremarkable. Other: No abdominal wall hernia or abnormality. No abdominopelvic ascites. Musculoskeletal: Normal. IMPRESSION: 1. Normal CT scan of the abdomen and pelvis with contrast. No findings to account for the patient's abdominal pain or nausea and vomiting. Electronically Signed   By: Lajean Manes M.D.   On: 10/25/2022 13:33   DG Chest 2 View  Result Date: 10/25/2022 CLINICAL  DATA:  Dyspnea.  Shortness of breath. EXAM: CHEST - 2 VIEW COMPARISON:  AP chest 05/24/2022 FINDINGS: Cardiac silhouette and mediastinal contours are within normal limits. The lungs are clear. No pleural effusion or pneumothorax. There is again mild dextrocurvature of the mid to lower thoracic spine. IMPRESSION: No active cardiopulmonary disease. Electronically Signed   By: Yvonne Kendall M.D.   On: 10/25/2022 10:22    Pending Labs Unresulted Labs (From admission, onward)    None       Vitals/Pain Today's Vitals   10/25/22 1200 10/25/22 1230 10/25/22 1339 10/25/22 1354  BP: 104/73 (!) 119/93  120/70  Pulse:  93  (!) 111  Resp:  19  (!) 24  Temp:    98.2 F (36.8 C)  TempSrc:    Oral  SpO2:  100%  100%  PainSc:   0-No pain     Isolation Precautions No active isolations  Medications Medications  sodium chloride (PF) 0.9 % injection (  Not Given 10/25/22 1313)  lactated ringers bolus 1,000 mL (0 mLs Intravenous Stopped 10/25/22 1110)  ondansetron (ZOFRAN) injection 4 mg (4 mg Intravenous Given 10/25/22 1005)  metoCLOPramide (REGLAN) injection 10 mg (10 mg Intravenous Given 10/25/22 1107)  diphenhydrAMINE (BENADRYL) injection 25 mg (25 mg Intravenous Given 10/25/22 1107)  lactated ringers bolus 1,000 mL (0 mLs Intravenous Stopped 10/25/22 1339)  potassium chloride SA (KLOR-CON M) CR tablet 40 mEq (40 mEq Oral Given 10/25/22 1142)  magnesium oxide (MAG-OX) tablet 800 mg (800 mg Oral Given 10/25/22 1141)  iohexol (OMNIPAQUE) 300 MG/ML solution 100 mL (100 mLs Intravenous Contrast Given 10/25/22 1301)    Mobility walks Low fall risk   Focused Assessments Cardiac Assessment Handoff:    Lab Results  Component Value Date   CKTOTAL 82 12/14/2020   TROPONINI <0.03 07/03/2017   Lab Results  Component Value Date   DDIMER <0.27 11/22/2021   Does the Patient currently have chest pain? No    R Recommendations: See Admitting Provider Note  Report given to:   Additional Notes:

## 2022-10-25 NOTE — ED Triage Notes (Signed)
[facility] reports pt coming from GI doc today for sob with exertion for the past month, N/V and abdominal pain x1 week. Doctors office checked pt pressure and called EMS for tx.

## 2022-10-25 NOTE — ED Provider Notes (Signed)
[facility] DEPT Provider Note  CSN: [id number] Arrival date & time: 10/25/22 6606  Chief Complaint(s) Abdominal Pain  HPI Edward Walls is a 42 y.o. male with PMH type 1 diabetes, previous hospital admissions for DKA who presents emergency department for evaluation of abdominal pain nausea and vomiting.  Patient states that he has had progressive symptoms for approximately 1 month and has been unable to successfully tolerate p.o. over the last 6 days.  He was seen at his outpatient gastroenterology appointment today and transferred to the emergency department for further evaluation.  Sugars have been well-controlled as he has an insulin pump and the patient is tachycardic but overall hemodynamically stable on arrival.   Past Medical History Past Medical History:  Diagnosis Date   Diabetes mellitus without complication ([place])    Leg pain    Leg weakness    Seasonal allergies    Patient Active Problem List   Diagnosis Date Noted   Diabetic ketoacidosis without coma associated with type 1 diabetes mellitus ([place]) 09/28/2022   GERD without esophagitis 09/28/2022   Intractable nausea and vomiting 09/27/2022   Gastroparesis 06/27/2022   DKA (diabetic ketoacidosis) ([place]) 05/24/2022   Paresthesia 12/14/2020   Diabetic neuropathy ([place]) 12/14/2020   Neuropathic pain 12/14/2020   Hypokalemia due to excessive gastrointestinal loss of potassium 06/06/2020   Hypophosphatemia 06/06/2020   Hypomagnesemia 06/06/2020   Hypocalcemia 06/06/2020   Dyslipidemia 03/23/2020   Diabetes mellitus type 1.5, managed as type 1 ([place]) 01/28/2020   Onychomycosis 01/28/2020   Alcohol abuse 03/14/2018   Type 1 diabetes mellitus with ketoacidosis, uncontrolled ([place]) 03/14/2018   Abnormal weight loss 07/04/2017   Headache 07/03/2017   Nausea & vomiting 07/03/2017   Abdominal pain 07/03/2017   DKA (diabetic ketoacidoses) 10/20/2016   Home Medication(s) Prior to Admission  medications   Medication Sig Start Date End Date Taking? Authorizing Provider  Continuous Blood Gluc Sensor (DEXCOM G6 SENSOR) MISC Replace every 10 days 08/11/22   Philemon Kingdom, MD  Continuous Blood Gluc Sensor (FREESTYLE LIBRE 2 SENSOR) MISC CHANGE SENSOR EVERY 14 DAYS 08/04/22   [name], MD  Continuous Blood Gluc Transmit (DEXCOM G6 TRANSMITTER) MISC 1 Device by Does not apply route every 3 (three) months. 02/20/22   Philemon Kingdom, MD  Continuous Blood Gluc Transmit (DEXCOM G6 TRANSMITTER) MISC 1 Device by Does not apply route every 3 (three) months. 02/20/22   Philemon Kingdom, MD  docusate sodium (COLACE) 100 MG capsule Take 1 capsule (100 mg total) by mouth 2 (two) times daily as needed for mild constipation. 05/26/22   Raiford Noble Latif, DO  gabapentin (NEURONTIN) 300 MG capsule Take 2 capsules (600 mg total) by mouth 3 (three) times daily. 06/27/22   Marcial Pacas, MD  Insulin Disposable Pump (OMNIPOD 5 G6 INTRO, GEN 5,) KIT 1 each by Does not apply route as needed. 02/20/22   Philemon Kingdom, MD  Insulin Disposable Pump (OMNIPOD 5 G6 POD, GEN 5,) MISC 1 each by Does not apply route every 3 (three) days. 02/20/22   Philemon Kingdom, MD  Insulin Lispro-aabc (LYUMJEV) 100 UNIT/ML SOLN Inject 80 Units into the skin daily. Via pump. 08/18/22   Philemon Kingdom, MD  Insulin Pen Needle 32G X 4 MM MISC Use 4-5x a day 05/26/22   Raiford Noble Latif, DO  metoCLOPramide (REGLAN) 10 MG tablet Take 1 tablet (10 mg total) by mouth 3 (three) times daily as needed for nausea. 09/29/22   Shalhoub, Sherryll Burger, MD  pantoprazole ([place])  40 MG tablet Take 1 tablet (40 mg total) by mouth daily. 09/29/22 09/29/23  Shalhoub, Sherryll Burger, MD  polyethylene glycol (MIRALAX / GLYCOLAX) 17 g packet Take 17 g by mouth daily as needed for moderate constipation. 05/26/22   Kerney Elbe, DO                                                                                                                                     Past Surgical History Past Surgical History:  Procedure Laterality Date   DENTAL SURGERY     Family History Family History  Problem Relation Age of Onset   Diabetes Mellitus II Mother    Diabetes Mellitus II Father    Diabetes Mellitus II Brother     Social History Social History   Tobacco Use   Smoking status: Former    Types: Cigarettes   Smokeless tobacco: Never  Substance Use Topics   Alcohol use: Yes    Alcohol/week: 0.0 standard drinks of alcohol    Comment: occasional    Drug use: Never   Allergies Other and Peanut-containing drug products  Review of Systems Review of Systems  Constitutional:  Positive for fatigue.  Respiratory:  Positive for shortness of breath.   Gastrointestinal:  Positive for abdominal pain, nausea and vomiting.    Physical Exam Vital Signs  I have reviewed the triage vital signs BP (!) 127/98 (BP Location: Left Arm)   Pulse (!) 113   Temp 98 F (36.7 C) (Oral)   Resp 18   SpO2 100%   Physical Exam Vitals and nursing note reviewed.  Constitutional:      General: He is not in acute distress.    Appearance: He is well-developed.  HENT:     Head: Normocephalic and atraumatic.  Eyes:     Conjunctiva/sclera: Conjunctivae normal.  Cardiovascular:     Rate and Rhythm: Normal rate and regular rhythm.     Heart sounds: No murmur heard. Pulmonary:     Effort: Pulmonary effort is normal. No respiratory distress.     Breath sounds: Normal breath sounds.  Abdominal:     Palpations: Abdomen is soft.     Tenderness: There is generalized abdominal tenderness.  Musculoskeletal:        General: No swelling.     Cervical back: Neck supple.  Skin:    General: Skin is warm and dry.     Capillary Refill: Capillary refill takes less than 2 seconds.  Neurological:     Mental Status: He is alert.  Psychiatric:        Mood and Affect: Mood normal.     ED Results and Treatments Labs (all labs ordered are listed, but only  abnormal results are displayed) Labs Reviewed  CBG MONITORING, ED - Abnormal; Notable for the following components:      Result Value   Glucose-Capillary 185 (*)    All other components  within normal limits  CBC WITH DIFFERENTIAL/PLATELET  COMPREHENSIVE METABOLIC PANEL  LIPASE, BLOOD  URINALYSIS, ROUTINE W REFLEX MICROSCOPIC                                                                                                                          Radiology No results found.  Pertinent labs & imaging results that were available during my care of the patient were reviewed by me and considered in my medical decision making (see MDM for details).  Medications Ordered in ED Medications - No data to display                                                                                                                                   Procedures .Critical Care  Performed by: Glendora Score, MD Authorized by: Glendora Score, MD   Critical care provider statement:    Critical care time (minutes):  30   Critical care was necessary to treat or prevent imminent or life-threatening deterioration of the following conditions:  Dehydration   Critical care was time spent personally by me on the following activities:  Development of treatment plan with patient or surrogate, discussions with consultants, evaluation of patient's response to treatment, examination of patient, ordering and review of laboratory studies, ordering and review of radiographic studies, ordering and performing treatments and interventions, pulse oximetry, re-evaluation of patient's condition and review of old charts   (including critical care time)  Medical Decision Making / ED Course   This patient presents to the ED for concern of abdominal pain nausea and vomiting, this involves an extensive number of treatment options, and is a complaint that carries with it a high risk of complications and morbidity.  The differential  diagnosis includes gastroparesis, obstruction, intra-abdominal infection, cyclic vomiting, DKA  MDM: Patient seen emergency room for evaluation of abdominal pain nausea vomiting, chest pain shortness of breath.  Physical exam with generalized abdominal tenderness to palpation but is otherwise unremarkable.  Laboratory evaluation is reassuring outside of a mild hypokalemia to 3.0 but is otherwise unremarkable.  Chest x-ray unremarkable, high-sensitivity troponin negative.  CT abdomen pelvis unremarkable.  Patient given multiple rounds of antiemetics and did have a mild improvement but vomited after p.o. challenge.  Spoke with Amy Esterwood of [facility] who will see the patient while inpatient.  Patient require hospital admission for inability tolerate p.o.  Suspect diabetic gastroparesis.  Additional history obtained:  -External records from outside source obtained and reviewed including: Chart review including previous notes, labs, imaging, consultation notes   Lab Tests: -I ordered, reviewed, and interpreted labs.   The pertinent results include:   Labs Reviewed  CBG MONITORING, ED - Abnormal; Notable for the following components:      Result Value   Glucose-Capillary 185 (*)    All other components within normal limits  CBC WITH DIFFERENTIAL/PLATELET  COMPREHENSIVE METABOLIC PANEL  LIPASE, BLOOD  URINALYSIS, ROUTINE W REFLEX MICROSCOPIC      EKG   EKG Interpretation  Date/Time:  Wednesday October 25 2022 10:30:14 EST Ventricular Rate:  81 PR Interval:  106 QRS Duration: 91 QT Interval:  392 QTC Calculation: 455 R Axis:   90 Text Interpretation: Sinus rhythm Short PR interval No significant change since last tracing Confirmed by [place] ([id number]) on 10/25/2022 3:05:30 PM         Imaging Studies ordered: I ordered imaging studies including CXR, CTAP I independently visualized and interpreted imaging. I agree with the radiologist interpretation   Medicines ordered  and prescription drug management: No orders of the defined types were placed in this encounter.   -I have reviewed the patients home medicines and have made adjustments as needed  Critical interventions IVF  Consultations Obtained: I requested consultation with the [facility] PA,  and discussed lab and imaging findings as well as pertinent plan - they recommend: Recommendations pending   Cardiac Monitoring: The patient was maintained on a cardiac monitor.  I personally viewed and interpreted the cardiac monitored which showed an underlying rhythm of: NSR, sinus tachycardia  Social Determinants of Health:  Factors impacting patients care include: none    Reevaluation: After the interventions noted above, I reevaluated the patient and found that they have :improved  Co morbidities that complicate the patient evaluation  Past Medical History:  Diagnosis Date   Diabetes mellitus without complication ([place])    Leg pain    Leg weakness    Seasonal allergies       Dispostion: I considered admission for this patient, and due to inability to tolerate p.o. patient require hospital admission     Final Clinical Impression(s) / ED Diagnoses Final diagnoses:  None     @PCDICTATION @    Teressa Lower, MD 10/25/22 [phone]

## 2022-10-25 NOTE — Patient Instructions (Addendum)
  We've sent you ER for evaluation. _______________________________________________________  If you are age 40 or younger, your body mass index should be between 19-25. Your Body mass index is 17.37 kg/m. If this is out of the aformentioned range listed, please consider follow up with your Primary Care Provider.   ________________________________________________________  The  GI providers would like to encourage you to use [facility] to communicate with providers for non-urgent requests or questions.  Due to long hold times on the telephone, sending your provider a message by [facility] may be a faster and more efficient way to get a response.  Please allow 48 business hours for a response.  Please remember that this is for non-urgent requests.  _______________________________________________________  Due to recent changes in healthcare laws, you may see the results of your imaging and laboratory studies on MyChart before your provider has had a chance to review them.  We understand that in some cases there may be results that are confusing or concerning to you. Not all laboratory results come back in the same time frame and the provider may be waiting for multiple results in order to interpret others.  Please give [place] 48 hours in order for your provider to thoroughly review all the results before contacting the office for clarification of your results.    Thank you for choosing me and [facility].  Vicie Mutters, PA-C

## 2022-10-25 NOTE — Progress Notes (Signed)
Reviewed and agree with management plans. ? ?Slyvia Lartigue L. Liza Czerwinski, MD, MPH  ?

## 2022-10-25 NOTE — Progress Notes (Addendum)
Patient ID: Edward Walls, male   DOB: 31-Dec-1980, 42 y.o.   MRN: [id number]    Progress Note   Subjective   Chief complaint; persistent daily nausea vomiting, inability to keep down p.o.'s and 20 pound weight loss over the past month.  Patient was seen in our office earlier today by Vicie Mutters, PA-C and sent to the emergency room for further evaluation after he had presented with the above symptoms, and was also complaining of dizziness, some shortness of breath and dehydration.  He was tachycardic in the office with blood pressure 100/60.  Patient is now being admitted GI asked to follow.  Patient is type I diabetic, diagnosed about 4 years ago and currently has insulin pump.  He had admission in mid December 2023 for DKA and had nausea and vomiting intractable associated with that.  He says he never really resolved those symptoms when he was discharged and has had persistent problems with daily nausea and vomiting ever since.  Usually vomits within 5 minutes of eating.  He complains of epigastric discomfort no severe pain, has had some recent heartburn and indigestion with vomiting.  No diarrhea or melena.  Has had some recent chills, and documented 20 pound weight loss. With his admission in December hemoglobin A1c was 10.6 and drug screen positive for THC. He was given a prescription for Reglan but had only been taking that once daily, he does not believe that he had any regular antiemetic to take at home.  No regular NSAID use, no regular EtOH use.  He admits that he had been using some cannabis recently for abdominal pain and said that seem to help.  He has not been using cannabis on a daily basis for months per patient.  CT of the abdomen and pelvis today with contrast shows no gallstones, no abnormality in the abdomen, no wall thickening or inflammatory changes.  Chest x-ray is negative WBC 4.4/hemoglobin 15.7/hematocrit 48.4 Potassium 3.0/glucose 179/CO2 26//BUN 19/creatinine  0.91 LFTs normal other than T. bili of 4.3 UA positive for protein and ketones 11-20 WBCs, no bacteria seen    Objective   Vital signs in last 24 hours: Temp:  [98 F (36.7 C)-98.2 F (36.8 C)] 98.2 F (36.8 C) (01/10 1354) Pulse Rate:  [80-113] 111 (01/10 1354) Resp:  [17-24] 24 (01/10 1354) BP: (100-127)/(60-101) 120/70 (01/10 1354) SpO2:  [100 %] 100 % (01/10 1354) Weight:  [63 kg] 63 kg (01/10 0829)   General:    Thin African-American male in NAD with an emesis basin, vomited once while I was in the room BMI 17 Heart: tachy Regular rate and rhythm; no murmurs Lungs: Respirations even and unlabored, lungs CTA bilaterally Abdomen:  Soft,  nondistended.,  He is tender across the upper abdomen no guarding or rebound no palpable mass or hepatosplenomegaly normal bowel sounds. Extremities:  Without edema. Neurologic:  Alert and oriented,  grossly normal neurologically. Psych:  Cooperative. Normal mood and affect.  Intake/Output from previous day: No intake/output data recorded. Intake/Output this shift: No intake/output data recorded.  Lab Results: Recent Labs    10/25/22 0938  WBC 4.4  HGB 15.7  HCT 48.4  PLT 371   BMET Recent Labs    10/25/22 0938  NA 135  K 3.0*  CL 97*  CO2 26  GLUCOSE 179*  BUN 19  CREATININE 0.91  CALCIUM 9.0   LFT Recent Labs    10/25/22 0938  PROT 7.6  ALBUMIN 4.3  AST 21  ALT 22  ALKPHOS 54  BILITOT 4.3*   PT/INR No results for input(s): "LABPROT", "INR" in the last 72 hours.  Studies/Results: CT ABDOMEN PELVIS W CONTRAST  Result Date: 10/25/2022 CLINICAL DATA:  Shortness of breath with exertion. Abdominal pain with nausea vomiting. EXAM: CT ABDOMEN AND PELVIS WITH CONTRAST TECHNIQUE: Multidetector CT imaging of the abdomen and pelvis was performed using the standard protocol following bolus administration of intravenous contrast. RADIATION DOSE REDUCTION: This exam was performed according to the departmental  dose-optimization program which includes automated exposure control, adjustment of the mA and/or kV according to patient size and/or use of iterative reconstruction technique. CONTRAST:  158mL OMNIPAQUE IOHEXOL 300 MG/ML  SOLN COMPARISON:  09/27/2022. FINDINGS: Lower chest: Clear lung bases. Hepatobiliary: No focal liver abnormality is seen. No gallstones, gallbladder wall thickening, or biliary dilatation. Pancreas: Unremarkable. No pancreatic ductal dilatation or surrounding inflammatory changes. Spleen: Normal in size without focal abnormality. Adrenals/Urinary Tract: Adrenal glands are unremarkable. Kidneys are normal, without renal calculi, focal lesion, or hydronephrosis. Bladder is unremarkable. Stomach/Bowel: Bowel assessment somewhat limited by lack mesenteric fat. Allowing for this, stomach is unremarkable. Small bowel and colon are normal in caliber. No wall thickening or convincing inflammation. Appendix not definitively seen, but no findings of acute appendicitis. Vascular/Lymphatic: No significant vascular findings are present. No enlarged abdominal or pelvic lymph nodes. Reproductive: Unremarkable. Other: No abdominal wall hernia or abnormality. No abdominopelvic ascites. Musculoskeletal: Normal. IMPRESSION: 1. Normal CT scan of the abdomen and pelvis with contrast. No findings to account for the patient's abdominal pain or nausea and vomiting. Electronically Signed   By: Lajean Manes M.D.   On: 10/25/2022 13:33   DG Chest 2 View  Result Date: 10/25/2022 CLINICAL DATA:  Dyspnea.  Shortness of breath. EXAM: CHEST - 2 VIEW COMPARISON:  AP chest 05/24/2022 FINDINGS: Cardiac silhouette and mediastinal contours are within normal limits. The lungs are clear. No pleural effusion or pneumothorax. There is again mild dextrocurvature of the mid to lower thoracic spine. IMPRESSION: No active cardiopulmonary disease. Electronically Signed   By: Yvonne Kendall M.D.   On: 10/25/2022 10:22       Assessment /  Plan:    #9 42 year old African-American male with type 1 diabetes, currently with insulin pump.  Presented to the office today for evaluation with 1 month history of daily nausea and vomiting, inability to keep down p.o.'s and 20 pound weight loss Admission mid December 2023 with DKA, patient says symptoms never really resolved after that admission and a has had persistent daily nausea and vomiting  I suspect his symptoms are secondary to diabetic gastroparesis Less likely gastropathy or peptic ulcer disease, he has been using some cannabis but that has not been on a long-term regular basis, making cannabis hyperemesis syndrome less likely  Ruled out for DKA today  #2 hypokalemia secondary to above #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 IV daily Start Zofran 4 mg every 6 hours IV around-the-clock Sips of clears for today, can advance as tolerated  Consider endoscopy this admission. 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 I agree with the Advanced Practitioner's note, impression and recommendations, and have recorded additional findings, impressions and recommendations below. I performed a substantive portion of this encounter (>50% time spent), including a complete performance of the medical decision making.  My additional thoughts are as follows:  42 year old man with insulin requiring type 1 diabetes for the last several years here for intractable nausea and vomiting with volume depletion and electrolyte derangement.  Not currently in DKA.  Suspect  diabetic gastropathy, and he has lately been in a vicious cycle of symptoms from that leading to poor glucose control and worsening of the underlying condition.  Admission to medical service for fluids, round-the-clock alternating antiemetics, electrolyte repletion.  He was taking Reglan as prescribed once daily since recent hospital discharge, and I believe he will need it 3-4 times daily at the time of discharge this time.  Possible inpatient upper endoscopy depending on patient's overall clinical progress and endoscopy schedule availability.  He probably does not have an obstructive cause for the symptoms, but that should be ruled out interim to inform outpatient medical therapy.  Optimal glucose management essential for control of these digestive symptoms as well.   Charlie Pitter III [phone]

## 2022-10-26 DIAGNOSIS — E43 Unspecified severe protein-calorie malnutrition: Secondary | ICD-10-CM | POA: Diagnosis present

## 2022-10-26 DIAGNOSIS — Z833 Family history of diabetes mellitus: Secondary | ICD-10-CM | POA: Diagnosis not present

## 2022-10-26 DIAGNOSIS — D649 Anemia, unspecified: Secondary | ICD-10-CM | POA: Diagnosis not present

## 2022-10-26 DIAGNOSIS — Z9641 Presence of insulin pump (external) (internal): Secondary | ICD-10-CM | POA: Diagnosis present

## 2022-10-26 DIAGNOSIS — K2100 Gastro-esophageal reflux disease with esophagitis, without bleeding: Secondary | ICD-10-CM | POA: Diagnosis present

## 2022-10-26 DIAGNOSIS — E785 Hyperlipidemia, unspecified: Secondary | ICD-10-CM | POA: Diagnosis present

## 2022-10-26 DIAGNOSIS — E860 Dehydration: Secondary | ICD-10-CM | POA: Diagnosis present

## 2022-10-26 DIAGNOSIS — R112 Nausea with vomiting, unspecified: Secondary | ICD-10-CM

## 2022-10-26 DIAGNOSIS — K3184 Gastroparesis: Secondary | ICD-10-CM | POA: Diagnosis not present

## 2022-10-26 DIAGNOSIS — F1011 Alcohol abuse, in remission: Secondary | ICD-10-CM | POA: Diagnosis present

## 2022-10-26 DIAGNOSIS — Z79899 Other long term (current) drug therapy: Secondary | ICD-10-CM | POA: Diagnosis not present

## 2022-10-26 DIAGNOSIS — E876 Hypokalemia: Secondary | ICD-10-CM | POA: Diagnosis not present

## 2022-10-26 DIAGNOSIS — R066 Hiccough: Secondary | ICD-10-CM | POA: Diagnosis present

## 2022-10-26 DIAGNOSIS — E1065 Type 1 diabetes mellitus with hyperglycemia: Secondary | ICD-10-CM | POA: Diagnosis not present

## 2022-10-26 DIAGNOSIS — Z91010 Allergy to peanuts: Secondary | ICD-10-CM | POA: Diagnosis not present

## 2022-10-26 DIAGNOSIS — Z681 Body mass index (BMI) 19 or less, adult: Secondary | ICD-10-CM | POA: Diagnosis not present

## 2022-10-26 DIAGNOSIS — E1043 Type 1 diabetes mellitus with diabetic autonomic (poly)neuropathy: Secondary | ICD-10-CM | POA: Diagnosis present

## 2022-10-26 DIAGNOSIS — Z91018 Allergy to other foods: Secondary | ICD-10-CM | POA: Diagnosis not present

## 2022-10-26 DIAGNOSIS — K5900 Constipation, unspecified: Secondary | ICD-10-CM | POA: Diagnosis present

## 2022-10-26 DIAGNOSIS — R634 Abnormal weight loss: Secondary | ICD-10-CM | POA: Diagnosis present

## 2022-10-26 DIAGNOSIS — Z794 Long term (current) use of insulin: Secondary | ICD-10-CM | POA: Diagnosis not present

## 2022-10-26 DIAGNOSIS — Z87891 Personal history of nicotine dependence: Secondary | ICD-10-CM | POA: Diagnosis not present

## 2022-10-26 LAB — CBC
HCT: 38 % — ABNORMAL LOW (ref 39.0–52.0)
Hemoglobin: 12.3 g/dL — ABNORMAL LOW (ref 13.0–17.0)
MCH: 27.6 pg (ref 26.0–34.0)
MCHC: 32.4 g/dL (ref 30.0–36.0)
MCV: 85.2 fL (ref 80.0–100.0)
Platelets: 271 10*3/uL (ref 150–400)
RBC: 4.46 MIL/uL (ref 4.22–5.81)
RDW: 12.6 % (ref 11.5–15.5)
WBC: 4.2 10*3/uL (ref 4.0–10.5)
nRBC: 0 % (ref 0.0–0.2)

## 2022-10-26 LAB — COMPREHENSIVE METABOLIC PANEL
ALT: 15 U/L (ref 0–44)
AST: 12 U/L — ABNORMAL LOW (ref 15–41)
Albumin: 3 g/dL — ABNORMAL LOW (ref 3.5–5.0)
Alkaline Phosphatase: 37 U/L — ABNORMAL LOW (ref 38–126)
Anion gap: 10 (ref 5–15)
BUN: 12 mg/dL (ref 6–20)
CO2: 26 mmol/L (ref 22–32)
Calcium: 8.1 mg/dL — ABNORMAL LOW (ref 8.9–10.3)
Chloride: 100 mmol/L (ref 98–111)
Creatinine, Ser: 0.77 mg/dL (ref 0.61–1.24)
GFR, Estimated: >60 mL/min (ref >60)
Glucose, Bld: 197 mg/dL — ABNORMAL HIGH (ref 70–99)
Potassium: 4.1 mmol/L (ref 3.5–5.1)
Sodium: 136 mmol/L (ref 135–145)
Total Bilirubin: 3.1 mg/dL — ABNORMAL HIGH (ref 0.3–1.2)
Total Protein: 4.8 g/dL — ABNORMAL LOW (ref 6.5–8.1)

## 2022-10-26 LAB — GLUCOSE, CAPILLARY
Glucose-Capillary: 211 mg/dL — ABNORMAL HIGH (ref 70–99)
Glucose-Capillary: 239 mg/dL — ABNORMAL HIGH (ref 70–99)
Glucose-Capillary: 239 mg/dL — ABNORMAL HIGH (ref 70–99)
Glucose-Capillary: 143 mg/dL — ABNORMAL HIGH (ref 70–99)
Glucose-Capillary: 187 mg/dL — ABNORMAL HIGH (ref 70–99)

## 2022-10-26 MED ORDER — SODIUM CHLORIDE 0.9 % IV SOLN
6.2500 mg | Freq: Four times a day (QID) | INTRAVENOUS | Status: DC | PRN
  Administered 2022-10-26: 6.25 mg via INTRAVENOUS
  Filled 2022-10-26 (×4): qty 0.25

## 2022-10-26 MED ORDER — INSULIN ASPART 100 UNIT/ML IJ SOLN
0.0000 [IU] | Freq: Three times a day (TID) | INTRAMUSCULAR | Status: DC
  Administered 2022-10-26: 3 [IU] via SUBCUTANEOUS
  Administered 2022-10-27: 5 [IU] via SUBCUTANEOUS
  Administered 2022-10-27: 1 [IU] via SUBCUTANEOUS
  Administered 2022-10-27: 2 [IU] via SUBCUTANEOUS
  Administered 2022-10-28: 5 [IU] via SUBCUTANEOUS
  Administered 2022-10-28: 7 [IU] via SUBCUTANEOUS
  Administered 2022-10-28: 1 [IU] via SUBCUTANEOUS

## 2022-10-26 MED ORDER — SODIUM CHLORIDE 0.9 % IV SOLN: 12.5000 mg | Freq: Four times a day (QID) | INTRAVENOUS | Status: DC | PRN

## 2022-10-26 MED ORDER — POTASSIUM CHLORIDE IN NACL 20-0.9 MEQ/L-% IV SOLN
INTRAVENOUS | Status: AC
  Filled 2022-10-26 (×2): qty 1000

## 2022-10-26 MED ORDER — INSULIN GLARGINE-YFGN 100 UNIT/ML ~~LOC~~ SOLN
15.0000 [IU] | Freq: Every day | SUBCUTANEOUS | Status: DC
  Administered 2022-10-26 – 2022-10-28 (×3): 15 [IU] via SUBCUTANEOUS
  Filled 2022-10-26 (×4): qty 0.15

## 2022-10-26 NOTE — Progress Notes (Signed)
Central cardiac Monitoring called notified HR 150. Nurse in room. Patient was sitting up and vomiting.

## 2022-10-26 NOTE — TOC CM/SW Note (Signed)
Transition of Care [facility]) Screening Note  Patient Details  Name: Edward Walls Date of Birth: 10-31-1980  Transition of Care [facility]) CM/SW Contact:    Sherie Don, LCSW Phone Number: 10/26/2022, 10:44 AM  Transition of Care Department [facility]) has reviewed patient and no TOC needs have been identified at this time. We will continue to monitor patient advancement through interdisciplinary progression rounds. If new patient transition needs arise, please place a TOC consult.

## 2022-10-26 NOTE — Progress Notes (Signed)
[facility]                                                                               Jonn Chaikin, is a 42 y.o. male, DOB - 04/17/1981, GEX:[id number] Admit date - 10/25/2022    Outpatient Primary MD for the patient is [name], MD  LOS - 0  days    Brief summary     Edward Walls is a 42 y.o. male with medical history significant of lower extremity weakness, seasonal allergies, type 1 diabetes mellitus, recently admitted last month for DKA who is coming to the emergency department referred by GI clinic due to dyspnea on exertion for the past month associated with abdominal pain, nausea and vomiting for the past week.    He was admitted for evaluation and management of persistent nausea and vomiting.   Assessment & Plan    Assessment and Plan:   Persistent Nausea and vomiting  Differential include DKA vs gastroparesis vs viral gastroenteritis vs gastritis.  DKA ruled out.  Will need outpatient gastrict emptying study.  GI on board and plan for EGD when available.  Symptomatic management with IV fluids,  IV Reglan, IV Protonix. IV phenergan.  CT abd and pelvis wnl.     Type 1 DM,  On insulin pump at home. Currently on SSI.  CBG (last 3)  Recent Labs    10/25/22 2300 10/26/22 0732 10/26/22 1152  GLUCAP 180* 211* 239*   Get A1c.  Continue with SSI for now.       Hypokalemia:  Replaced.      Estimated body mass index is 18.49 kg/m as calculated from the following:   Height as of this encounter: 6\' 3"  (1.905 m).   Weight as of this encounter: 67.1 kg.  Code Status: full code.  DVT Prophylaxis:  SCDs Start: 10/25/22 1600   Level of Care: Level of care: Med-Surg Family Communication: none at bedside.   Disposition Plan:     Remains inpatient appropriate:  still symptomatic with nausea. EGD int he next 24 hours.   Procedures:  EGD to be scheduled.   Consultants:   GI  Antimicrobials:   Anti-infectives (From  admission, onward)    None        Medications  Scheduled Meds:  metoCLOPramide (REGLAN) injection  10 mg Intravenous Q6H   ondansetron  4 mg Intravenous Q6H   pantoprazole (PROTONIX) IV  40 mg Intravenous Daily   Continuous Infusions:  0.9 % NaCl with KCl 20 mEq / L 100 mL/hr at 10/26/22 1229   promethazine (PHENERGAN) injection (IM or IVPB)     PRN Meds:.acetaminophen **OR** acetaminophen, HYDROmorphone (DILAUDID) injection, promethazine (PHENERGAN) injection (IM or IVPB)    Subjective:   Edward Walls was seen and examined today.  Nausea , no vomiting today.   Objective:   Vitals:   10/25/22 1811 10/25/22 2145 10/25/22 2159 10/25/22 2232  BP: 109/76 115/81    Pulse: 88 82    Resp: 18 18    Temp: 98.4 F (36.9 C)  98.1 F (36.7 C)   TempSrc: Oral  Oral   SpO2: 97% 99%    Weight:  67.1 kg  Height:    6\' 3"  (1.905 m)    Intake/Output Summary (Last 24 hours) at 10/26/2022 1414 Last data filed at 10/26/2022 1342 Gross per 24 hour  Intake 1982.36 ml  Output 1450 ml  Net 532.36 ml   Filed Weights   10/25/22 2232  Weight: 67.1 kg     Exam General exam: ALERT AND in mild discomfort from vomiting.  Respiratory system: Clear to auscultation. Respiratory effort normal. Cardiovascular system: S1 & S2 heard, RRR. No JVD, murmurs, Gastrointestinal system: Abdomen is nondistended, soft and nontender.  Central nervous system: Alert and oriented. No focal neurological deficits. Extremities: Symmetric 5 x 5 power. Skin: No rashes, lesions or ulcers Psychiatry: Mood & affect appropriate.     Data Reviewed:  I have personally reviewed following labs and imaging studies   CBC Lab Results  Component Value Date   WBC 4.2 10/26/2022   RBC 4.46 10/26/2022   HGB 12.3 (L) 10/26/2022   HCT 38.0 (L) 10/26/2022   MCV 85.2 10/26/2022   MCH 27.6 10/26/2022   PLT 271 10/26/2022   MCHC 32.4 10/26/2022   RDW 12.6 10/26/2022   LYMPHSABS 1.7 10/25/2022   MONOABS 0.4  10/25/2022   EOSABS 0.0 10/25/2022   BASOSABS 0.0 10/25/2022     Last metabolic panel Lab Results  Component Value Date   NA 136 10/26/2022   K 4.1 10/26/2022   CL 100 10/26/2022   CO2 26 10/26/2022   BUN 12 10/26/2022   CREATININE 0.77 10/26/2022   GLUCOSE 197 (H) 10/26/2022   GFRNONAA >60 10/26/2022   GFRAA >60 06/07/2020   CALCIUM 8.1 (L) 10/26/2022   PHOS 2.5 09/28/2022   PROT 4.8 (L) 10/26/2022   ALBUMIN 3.0 (L) 10/26/2022   LABGLOB 2.9 12/14/2020   BILITOT 3.1 (H) 10/26/2022   ALKPHOS 37 (L) 10/26/2022   AST 12 (L) 10/26/2022   ALT 15 10/26/2022   ANIONGAP 10 10/26/2022    CBG (last 3)  Recent Labs    10/25/22 2300 10/26/22 0732 10/26/22 1152  GLUCAP 180* 211* 239*      Coagulation Profile: No results for input(s): "INR", "PROTIME" in the last 168 hours.   Radiology Studies: CT ABDOMEN PELVIS W CONTRAST  Result Date: 10/25/2022 CLINICAL DATA:  Shortness of breath with exertion. Abdominal pain with nausea vomiting. EXAM: CT ABDOMEN AND PELVIS WITH CONTRAST TECHNIQUE: Multidetector CT imaging of the abdomen and pelvis was performed using the standard protocol following bolus administration of intravenous contrast. RADIATION DOSE REDUCTION: This exam was performed according to the departmental dose-optimization program which includes automated exposure control, adjustment of the mA and/or kV according to patient size and/or use of iterative reconstruction technique. CONTRAST:  12/24/2022 OMNIPAQUE IOHEXOL 300 MG/ML  SOLN COMPARISON:  09/27/2022. FINDINGS: Lower chest: Clear lung bases. Hepatobiliary: No focal liver abnormality is seen. No gallstones, gallbladder wall thickening, or biliary dilatation. Pancreas: Unremarkable. No pancreatic ductal dilatation or surrounding inflammatory changes. Spleen: Normal in size without focal abnormality. Adrenals/Urinary Tract: Adrenal glands are unremarkable. Kidneys are normal, without renal calculi, focal lesion, or hydronephrosis.  Bladder is unremarkable. Stomach/Bowel: Bowel assessment somewhat limited by lack mesenteric fat. Allowing for this, stomach is unremarkable. Small bowel and colon are normal in caliber. No wall thickening or convincing inflammation. Appendix not definitively seen, but no findings of acute appendicitis. Vascular/Lymphatic: No significant vascular findings are present. No enlarged abdominal or pelvic lymph nodes. Reproductive: Unremarkable. Other: No abdominal wall hernia or abnormality. No abdominopelvic ascites. Musculoskeletal: Normal. IMPRESSION:  1. Normal CT scan of the abdomen and pelvis with contrast. No findings to account for the patient's abdominal pain or nausea and vomiting. Electronically Signed   By: Lajean Manes M.D.   On: 10/25/2022 13:33   DG Chest 2 View  Result Date: 10/25/2022 CLINICAL DATA:  Dyspnea.  Shortness of breath. EXAM: CHEST - 2 VIEW COMPARISON:  AP chest 05/24/2022 FINDINGS: Cardiac silhouette and mediastinal contours are within normal limits. The lungs are clear. No pleural effusion or pneumothorax. There is again mild dextrocurvature of the mid to lower thoracic spine. IMPRESSION: No active cardiopulmonary disease. Electronically Signed   By: Yvonne Kendall M.D.   On: 10/25/2022 10:22       Hosie Poisson M.D. [facility] 10/26/2022, 2:14 PM  Available via Epic secure chat 7am-7pm After 7 pm, please refer to night coverage provider listed on amion.

## 2022-10-26 NOTE — Inpatient Diabetes Management (Signed)
Inpatient Diabetes Program Recommendations  AACE/ADA: New Consensus Statement on Inpatient Glycemic Control (2015)  Target Ranges:  Prepandial:   less than 140 mg/dL      Peak postprandial:   less than 180 mg/dL (1-2 hours)      Critically ill patients:  140 - 180 mg/dL   Lab Results  Component Value Date   GLUCAP 211 (H) 10/26/2022   HGBA1C 10.6 (H) 09/29/2022    Review of Glycemic Control  History: Type 1 diabetes (Needs Basal, Correction, and Meal Coverage Insulin)   Home DM Meds: Dexcom CGM                             Omnipod Insulin Pump   Current Orders:   Novolog 0-9 units TID                            ENDO: Dr. Cruzita Lederer with [facility] Looks like the Omni Pod 5 Insulin Pump was started 08/18/2022 Settings are as follows: Basal settings Basal rates 1.25 u/hr Bolus settings Target Blood glucose  140 mg/dL Insulin to carb (IC) ratio 1 (Bolus 7.5-10.5 units per meal and 3-4 units per snack) Correction factor  28 for BG above 140 Duration of insulin action 4  Inpatient Diabetes Program Recommendations:    Will need basal insulin since Type 1 DM.  Consider adding Semglee 15-20 units QHS  Secure text to MD. Will continue to follow.  Thank you. Lorenda Peck, RD, LDN, [facility] Coordinator [phone]

## 2022-10-26 NOTE — H&P (View-Only) (Signed)
[facility] Progress Note  CC:   persistent daily nausea vomiting, inability to keep down p.o.'s and 20 pound weight loss over the past month.   Subjective:  Feeling better today with the Zofran and Reglan ATC but still with some waves of nausea and just had an episode of vomiting about an hour before I saw him.  Has been sipping on small amounts of clears throughout the day.    Objective:  Vital signs in last 24 hours: Temp:  [98 F (36.7 C)-98.4 F (36.9 C)] 98.1 F (36.7 C) (01/10 2159) Pulse Rate:  [82-113] 82 (01/10 2145) Resp:  [11-24] 18 (01/10 2145) BP: (103-131)/(68-101) 115/81 (01/10 2145) SpO2:  [95 %-100 %] 99 % (01/10 2145) Weight:  [67.1 kg] 67.1 kg (01/10 2232) Last BM Date : 10/25/22 General:  Alert, Well-developed, in NAD Heart:  Regular rate and rhythm; no murmurs Pulm:  CTAB.  No W/R/R. Abdomen:  Soft, non-distended.  BS present.  Non-tender. Extremities:  Without edema. Neurologic:  Alert and oriented x 4;  grossly normal neurologically. Psych:  Alert and cooperative. Normal mood and affect.  Intake/Output from previous day: 01/10 0701 - 01/11 0700 In: 1380.7 [P.O.:240; I.V.:1140.7] Out: 1150 [Urine:1150]  Lab Results: Recent Labs    10/25/22 0938 10/26/22 0346  WBC 4.4 4.2  HGB 15.7 12.3*  HCT 48.4 38.0*  PLT 371 271   BMET Recent Labs    10/25/22 0938 10/26/22 0346  NA 135 136  K 3.0* 4.1  CL 97* 100  CO2 26 26  GLUCOSE 179* 197*  BUN 19 12  CREATININE 0.91 0.77  CALCIUM 9.0 8.1*   LFT Recent Labs    10/26/22 0346  PROT 4.8*  ALBUMIN 3.0*  AST 12*  ALT 15  ALKPHOS 37*  BILITOT 3.1*   CT ABDOMEN PELVIS W CONTRAST  Result Date: 10/25/2022 CLINICAL DATA:  Shortness of breath with exertion. Abdominal pain with nausea vomiting. EXAM: CT ABDOMEN AND PELVIS WITH CONTRAST TECHNIQUE: Multidetector CT imaging of the abdomen and pelvis was performed using the standard protocol following bolus administration of  intravenous contrast. RADIATION DOSE REDUCTION: This exam was performed according to the departmental dose-optimization program which includes automated exposure control, adjustment of the mA and/or kV according to patient size and/or use of iterative reconstruction technique. CONTRAST:  137mL OMNIPAQUE IOHEXOL 300 MG/ML  SOLN COMPARISON:  09/27/2022. FINDINGS: Lower chest: Clear lung bases. Hepatobiliary: No focal liver abnormality is seen. No gallstones, gallbladder wall thickening, or biliary dilatation. Pancreas: Unremarkable. No pancreatic ductal dilatation or surrounding inflammatory changes. Spleen: Normal in size without focal abnormality. Adrenals/Urinary Tract: Adrenal glands are unremarkable. Kidneys are normal, without renal calculi, focal lesion, or hydronephrosis. Bladder is unremarkable. Stomach/Bowel: Bowel assessment somewhat limited by lack mesenteric fat. Allowing for this, stomach is unremarkable. Small bowel and colon are normal in caliber. No wall thickening or convincing inflammation. Appendix not definitively seen, but no findings of acute appendicitis. Vascular/Lymphatic: No significant vascular findings are present. No enlarged abdominal or pelvic lymph nodes. Reproductive: Unremarkable. Other: No abdominal wall hernia or abnormality. No abdominopelvic ascites. Musculoskeletal: Normal. IMPRESSION: 1. Normal CT scan of the abdomen and pelvis with contrast. No findings to account for the patient's abdominal pain or nausea and vomiting. Electronically Signed   By: Lajean Manes M.D.   On: 10/25/2022 13:33   DG Chest 2 View  Result Date: 10/25/2022 CLINICAL DATA:  Dyspnea.  Shortness of breath. EXAM: CHEST - 2 VIEW COMPARISON:  AP chest  05/24/2022 FINDINGS: Cardiac silhouette and mediastinal contours are within normal limits. The lungs are clear. No pleural effusion or pneumothorax. There is again mild dextrocurvature of the mid to lower thoracic spine. IMPRESSION: No active cardiopulmonary  disease. Electronically Signed   By: Yvonne Kendall M.D.   On: 10/25/2022 10:22    Assessment / Plan: #47 42 year old African-American male with type 1 diabetes, currently with insulin pump.  Presented to the office today for evaluation with 1 month history of daily nausea and vomiting, inability to keep down p.o.'s and 20 pound weight loss Admission mid December 2023 with DKA, patient says symptoms never really resolved after that admission and a has had persistent daily nausea and vomiting.   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   Ruled out for DKA today.   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  Has been sipping on small amounts of clears throughout the day.  ? EGD with Dr. Loletha Carrow tomorrow afternoon. -Needs improved glucose management. -Consider GES as outpatient. -Continue current meds for now.    LOS: 0 days   Laban Emperor. Zehr  10/26/2022, 8:59 AM  I have taken an interval history, thoroughly reviewed the chart and examined the patient. I agree with the Advanced Practitioner's note, impression and recommendations, and have recorded additional findings, impressions and recommendations below. I performed a substantive portion of this encounter (>50% time spent), including a complete performance of the medical decision making.  My additional thoughts are as follows:  Still highly symptomatic and he appears miserable from nausea and hiccups.  It has been less than 24 hours since we started the standing dose of rotating antiemetics, so I think it needs some more time to take  effect.  As before, I would like him to have the upper endoscopy as inpatient.  We are working to see if endoscopy/anesthesia staffing and availability will allow tomorrow.  He was agreeable to proceeding if we could.  Agree his elevated bilirubin with remainder of LFTs normal in the setting of this physiologic stress most likely Gilbert's.  No biliary obstruction or portal/hepatic vein clot on CT scan.   Nelida Meuse III Office:[phone]

## 2022-10-26 NOTE — Anesthesia Preprocedure Evaluation (Addendum)
Anesthesia Evaluation  Patient identified by MRN, date of birth, ID band Patient awake    Reviewed: Allergy & Precautions, NPO status , Patient's Chart, lab work & pertinent test results  Airway Mallampati: II  TM Distance: >3 FB Neck ROM: Full    Dental  (+) Teeth Intact, Dental Advisory Given   Pulmonary former smoker   Pulmonary exam normal breath sounds clear to auscultation       Cardiovascular negative cardio ROS Normal cardiovascular exam Rhythm:Regular Rate:Normal     Neuro/Psych negative neurological ROS     GI/Hepatic Neg liver ROS,GERD  Medicated,, nausea and vomiting   Endo/Other  diabetes, Poorly Controlled, Type 1, Insulin Dependent    Renal/GU negative Renal ROS     Musculoskeletal negative musculoskeletal ROS (+)    Abdominal   Peds  Hematology  (+) Blood dyscrasia, anemia   Anesthesia Other Findings   Reproductive/Obstetrics                             Anesthesia Physical Anesthesia Plan  ASA: 3  Anesthesia Plan: MAC   Post-op Pain Management: Minimal or no pain anticipated   Induction: Intravenous  PONV Risk Score and Plan: 1 and TIVA and Treatment may vary due to age or medical condition  Airway Management Planned: Natural Airway and Simple Face Mask  Additional Equipment:   Intra-op Plan:   Post-operative Plan:   Informed Consent: I have reviewed the patients History and Physical, chart, labs and discussed the procedure including the risks, benefits and alternatives for the proposed anesthesia with the patient or authorized representative who has indicated his/her understanding and acceptance.     Dental advisory given  Plan Discussed with: CRNA  Anesthesia Plan Comments:        Anesthesia Quick Evaluation

## 2022-10-26 NOTE — Plan of Care (Signed)

## 2022-10-26 NOTE — Plan of Care (Signed)
?  Problem: Activity: ?Goal: Risk for activity intolerance will decrease ?Outcome: Progressing ?  ?Problem: Safety: ?Goal: Ability to remain free from injury will improve ?Outcome: Progressing ?  ?Problem: Pain Managment: ?Goal: General experience of comfort will improve ?Outcome: Progressing ?  ?

## 2022-10-26 NOTE — Plan of Care (Signed)
  Problem: Pain Managment: Goal: General experience of comfort will improve 10/26/2022 0734 by Mayme Genta, RN Outcome: Progressing 10/26/2022 0733 by Mayme Genta, RN Outcome: Progressing   Problem: Safety: Goal: Ability to remain free from injury will improve 10/26/2022 0734 by Mayme Genta, RN Outcome: Progressing 10/26/2022 0733 by Mayme Genta, RN Outcome: Progressing

## 2022-10-26 NOTE — Progress Notes (Addendum)
   [facility] Progress Note  CC:   persistent daily nausea vomiting, inability to keep down p.o.'s and 20 pound weight loss over the past month.   Subjective:  Feeling better today with the Zofran and Reglan ATC but still with some waves of nausea and just had an episode of vomiting about an hour before I saw him.  Has been sipping on small amounts of clears throughout the day.    Objective:  Vital signs in last 24 hours: Temp:  [98 F (36.7 C)-98.4 F (36.9 C)] 98.1 F (36.7 C) (01/10 2159) Pulse Rate:  [82-113] 82 (01/10 2145) Resp:  [11-24] 18 (01/10 2145) BP: (103-131)/(68-101) 115/81 (01/10 2145) SpO2:  [95 %-100 %] 99 % (01/10 2145) Weight:  [67.1 kg] 67.1 kg (01/10 2232) Last BM Date : 10/25/22 General:  Alert, Well-developed, in NAD Heart:  Regular rate and rhythm; no murmurs Pulm:  CTAB.  No W/R/R. Abdomen:  Soft, non-distended.  BS present.  Non-tender. Extremities:  Without edema. Neurologic:  Alert and oriented x 4;  grossly normal neurologically. Psych:  Alert and cooperative. Normal mood and affect.  Intake/Output from previous day: 01/10 0701 - 01/11 0700 In: 1380.7 [P.O.:240; I.V.:1140.7] Out: 1150 [Urine:1150]  Lab Results: Recent Labs    10/25/22 0938 10/26/22 0346  WBC 4.4 4.2  HGB 15.7 12.3*  HCT 48.4 38.0*  PLT 371 271   BMET Recent Labs    10/25/22 0938 10/26/22 0346  NA 135 136  K 3.0* 4.1  CL 97* 100  CO2 26 26  GLUCOSE 179* 197*  BUN 19 12  CREATININE 0.91 0.77  CALCIUM 9.0 8.1*   LFT Recent Labs    10/26/22 0346  PROT 4.8*  ALBUMIN 3.0*  AST 12*  ALT 15  ALKPHOS 37*  BILITOT 3.1*   CT ABDOMEN PELVIS W CONTRAST  Result Date: 10/25/2022 CLINICAL DATA:  Shortness of breath with exertion. Abdominal pain with nausea vomiting. EXAM: CT ABDOMEN AND PELVIS WITH CONTRAST TECHNIQUE: Multidetector CT imaging of the abdomen and pelvis was performed using the standard protocol following bolus administration of  intravenous contrast. RADIATION DOSE REDUCTION: This exam was performed according to the departmental dose-optimization program which includes automated exposure control, adjustment of the mA and/or kV according to patient size and/or use of iterative reconstruction technique. CONTRAST:  100mL OMNIPAQUE IOHEXOL 300 MG/ML  SOLN COMPARISON:  09/27/2022. FINDINGS: Lower chest: Clear lung bases. Hepatobiliary: No focal liver abnormality is seen. No gallstones, gallbladder wall thickening, or biliary dilatation. Pancreas: Unremarkable. No pancreatic ductal dilatation or surrounding inflammatory changes. Spleen: Normal in size without focal abnormality. Adrenals/Urinary Tract: Adrenal glands are unremarkable. Kidneys are normal, without renal calculi, focal lesion, or hydronephrosis. Bladder is unremarkable. Stomach/Bowel: Bowel assessment somewhat limited by lack mesenteric fat. Allowing for this, stomach is unremarkable. Small bowel and colon are normal in caliber. No wall thickening or convincing inflammation. Appendix not definitively seen, but no findings of acute appendicitis. Vascular/Lymphatic: No significant vascular findings are present. No enlarged abdominal or pelvic lymph nodes. Reproductive: Unremarkable. Other: No abdominal wall hernia or abnormality. No abdominopelvic ascites. Musculoskeletal: Normal. IMPRESSION: 1. Normal CT scan of the abdomen and pelvis with contrast. No findings to account for the patient's abdominal pain or nausea and vomiting. Electronically Signed   By: David  Ormond M.D.   On: 10/25/2022 13:33   DG Chest 2 View  Result Date: 10/25/2022 CLINICAL DATA:  Dyspnea.  Shortness of breath. EXAM: CHEST - 2 VIEW COMPARISON:  AP chest   05/24/2022 FINDINGS: Cardiac silhouette and mediastinal contours are within normal limits. The lungs are clear. No pleural effusion or pneumothorax. There is again mild dextrocurvature of the mid to lower thoracic spine. IMPRESSION: No active cardiopulmonary  disease. Electronically Signed   By: Yvonne Kendall M.D.   On: 10/25/2022 10:22    Assessment / Plan: #47 42 year old African-American male with type 1 diabetes, currently with insulin pump.  Presented to the office today for evaluation with 1 month history of daily nausea and vomiting, inability to keep down p.o.'s and 20 pound weight loss Admission mid December 2023 with DKA, patient says symptoms never really resolved after that admission and a has had persistent daily nausea and vomiting.   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   Ruled out for DKA today.   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  Has been sipping on small amounts of clears throughout the day.  ? EGD with Dr. Loletha Carrow tomorrow afternoon. -Needs improved glucose management. -Consider GES as outpatient. -Continue current meds for now.    LOS: 0 days   Edward Walls. Edward Walls  10/26/2022, 8:59 AM  I have taken an interval history, thoroughly reviewed the chart and examined the patient. I agree with the Advanced Practitioner's note, impression and recommendations, and have recorded additional findings, impressions and recommendations below. I performed a substantive portion of this encounter (>50% time spent), including a complete performance of the medical decision making.  My additional thoughts are as follows:  Still highly symptomatic and he appears miserable from nausea and hiccups.  It has been less than 24 hours since we started the standing dose of rotating antiemetics, so I think it needs some more time to take  effect.  As before, I would like him to have the upper endoscopy as inpatient.  We are working to see if endoscopy/anesthesia staffing and availability will allow tomorrow.  He was agreeable to proceeding if we could.  Agree his elevated bilirubin with remainder of LFTs normal in the setting of this physiologic stress most likely Gilbert's.  No biliary obstruction or portal/hepatic vein clot on CT scan.   Nelida Meuse III Office:[phone]

## 2022-10-27 ENCOUNTER — Inpatient Hospital Stay (HOSPITAL_COMMUNITY): Payer: Managed Care, Other (non HMO) | Admitting: Certified Registered Nurse Anesthetist

## 2022-10-27 ENCOUNTER — Encounter (HOSPITAL_COMMUNITY): Payer: Self-pay | Admitting: Internal Medicine

## 2022-10-27 ENCOUNTER — Encounter (HOSPITAL_COMMUNITY)
Admission: EM | Disposition: A | Discharge status: Home / Self Care | Payer: Self-pay | Source: Ambulatory Visit | Attending: Internal Medicine

## 2022-10-27 DIAGNOSIS — E1065 Type 1 diabetes mellitus with hyperglycemia: Secondary | ICD-10-CM

## 2022-10-27 DIAGNOSIS — K3184 Gastroparesis: Secondary | ICD-10-CM | POA: Diagnosis not present

## 2022-10-27 DIAGNOSIS — E876 Hypokalemia: Secondary | ICD-10-CM | POA: Diagnosis not present

## 2022-10-27 DIAGNOSIS — Z794 Long term (current) use of insulin: Secondary | ICD-10-CM | POA: Diagnosis not present

## 2022-10-27 DIAGNOSIS — Z87891 Personal history of nicotine dependence: Secondary | ICD-10-CM

## 2022-10-27 DIAGNOSIS — D649 Anemia, unspecified: Secondary | ICD-10-CM

## 2022-10-27 DIAGNOSIS — K2100 Gastro-esophageal reflux disease with esophagitis, without bleeding: Secondary | ICD-10-CM | POA: Diagnosis not present

## 2022-10-27 DIAGNOSIS — E1043 Type 1 diabetes mellitus with diabetic autonomic (poly)neuropathy: Secondary | ICD-10-CM | POA: Diagnosis not present

## 2022-10-27 DIAGNOSIS — E43 Unspecified severe protein-calorie malnutrition: Secondary | ICD-10-CM | POA: Insufficient documentation

## 2022-10-27 DIAGNOSIS — R112 Nausea with vomiting, unspecified: Secondary | ICD-10-CM | POA: Diagnosis not present

## 2022-10-27 HISTORY — PX: ESOPHAGOGASTRODUODENOSCOPY (EGD) WITH PROPOFOL: SHX5813

## 2022-10-27 LAB — GLUCOSE, CAPILLARY
Glucose-Capillary: 169 mg/dL — ABNORMAL HIGH (ref 70–99)
Glucose-Capillary: 180 mg/dL — ABNORMAL HIGH (ref 70–99)
Glucose-Capillary: 288 mg/dL — ABNORMAL HIGH (ref 70–99)
Glucose-Capillary: 137 mg/dL — ABNORMAL HIGH (ref 70–99)
Glucose-Capillary: 266 mg/dL — ABNORMAL HIGH (ref 70–99)

## 2022-10-27 SURGERY — ESOPHAGOGASTRODUODENOSCOPY (EGD) WITH PROPOFOL
Anesthesia: Monitor Anesthesia Care

## 2022-10-27 MED ORDER — PROPOFOL 1000 MG/100ML IV EMUL
INTRAVENOUS | Status: AC
  Filled 2022-10-27: qty 100

## 2022-10-27 MED ORDER — PROPOFOL 500 MG/50ML IV EMUL
INTRAVENOUS | Status: DC | PRN
  Administered 2022-10-27: 125 ug/kg/min via INTRAVENOUS

## 2022-10-27 MED ORDER — LIDOCAINE 2% (20 MG/ML) 5 ML SYRINGE
INTRAMUSCULAR | Status: DC | PRN
  Administered 2022-10-27: 60 mg via INTRAVENOUS

## 2022-10-27 MED ORDER — LACTATED RINGERS IV SOLN: INTRAVENOUS | Status: DC

## 2022-10-27 MED ORDER — PROPOFOL 10 MG/ML IV BOLUS
INTRAVENOUS | Status: DC | PRN
  Administered 2022-10-27 (×2): 20 mg via INTRAVENOUS
  Administered 2022-10-27: 10 mg via INTRAVENOUS
  Administered 2022-10-27: 20 mg via INTRAVENOUS

## 2022-10-27 MED ORDER — SODIUM CHLORIDE 0.9 % IV SOLN: INTRAVENOUS | Status: DC

## 2022-10-27 MED ORDER — GABAPENTIN 300 MG PO CAPS
600.0000 mg | ORAL_CAPSULE | Freq: Three times a day (TID) | ORAL | Status: DC
  Administered 2022-10-27 – 2022-10-28 (×5): 600 mg via ORAL
  Filled 2022-10-27 (×5): qty 2

## 2022-10-27 MED ORDER — PROPOFOL 10 MG/ML IV BOLUS
INTRAVENOUS | Status: AC
  Filled 2022-10-27: qty 20

## 2022-10-27 MED ORDER — GLUCERNA SHAKE PO LIQD
237.0000 mL | Freq: Three times a day (TID) | ORAL | Status: DC
  Administered 2022-10-27 – 2022-10-28 (×5): 237 mL via ORAL
  Filled 2022-10-27 (×6): qty 237

## 2022-10-27 MED ORDER — ADULT MULTIVITAMIN W/MINERALS CH
1.0000 | ORAL_TABLET | Freq: Every day | ORAL | Status: DC
  Administered 2022-10-27 – 2022-10-28 (×2): 1 via ORAL
  Filled 2022-10-27 (×2): qty 1

## 2022-10-27 SURGICAL SUPPLY — 15 items

## 2022-10-27 NOTE — Interval H&P Note (Signed)
History and Physical Interval Note:  10/27/2022 7:31 AM  Edward Walls  has presented today for surgery, with the diagnosis of nausea and vomiting.  The various methods of treatment have been discussed with the patient and family. After consideration of risks, benefits and other options for treatment, the patient has consented to  Procedure(s): ESOPHAGOGASTRODUODENOSCOPY (EGD) WITH PROPOFOL (N/A) as a surgical intervention.  The patient's history has been reviewed, patient examined, no change in status, stable for surgery.  I have reviewed the patient's chart and labs.  Questions were answered to the patient's satisfaction.    Patient feels about the same as when I saw him yesterday.  Nelida Meuse III

## 2022-10-27 NOTE — Op Note (Signed)
[facility] Patient Name: Edward Walls Procedure Date: 10/27/2022 MRN: [id number] Attending MD: Estill Cotta. Loletha Carrow , MD, [id number] Date of Birth: Apr 22, 1981 CSN: [id number] Age: 42 Admit Type: Inpatient Procedure:                Upper GI endoscopy Indications:              Nausea with vomiting and weight loss                           Poorly controlled type 1 diabetes -suspected                            gastroparesis Providers:                Mallie Mussel L. Loletha Carrow, MD, Jaci Carrel, RN, Brien Mates, Technician Referring MD:             [facility] Medicines:                Monitored Anesthesia Care Complications:            No immediate complications. Estimated Blood Loss:     Estimated blood loss: none. Procedure:                Pre-Anesthesia Assessment:                           - Prior to the procedure, a History and Physical                            was performed, and patient medications and                            allergies were reviewed. The patient's tolerance of                            previous anesthesia was also reviewed. The risks                            and benefits of the procedure and the sedation                            options and risks were discussed with the patient.                            All questions were answered, and informed consent                            was obtained. Prior Anticoagulants: The patient has                            taken no anticoagulant or antiplatelet agents. ASA                            Grade Assessment:  III - A patient with severe                            systemic disease. After reviewing the risks and                            benefits, the patient was deemed in satisfactory                            condition to undergo the procedure.                           After obtaining informed consent, the endoscope was                            passed under direct  vision. Throughout the                            procedure, the patient's blood pressure, pulse, and                            oxygen saturations were monitored continuously. The                            [id number]-[id number] ([id number]) Olympus endoscope was introduced                            through the mouth, and advanced to the second part                            of duodenum. The upper GI endoscopy was                            accomplished without difficulty. The patient                            tolerated the procedure well. Scope In: Scope Out: Findings:      The larynx was normal.      LA Grade A (one or more mucosal breaks less than 5 mm, not extending       between tops of 2 mucosal folds) esophagitis was found at the       gastroesophageal junction. This is the result of patient's protracted       vomiting.      The stomach was visibly normal. However, prolonged observation of the       stomach revealed no visible motility waves.      The cardia and gastric fundus were normal on retroflexion.      The examined duodenum was normal. Impression:               - Normal larynx.                           - LA Grade A esophagitis from vomiting                           -  Normal stomach.                           - Normal examined duodenum.                           - No specimens collected. Moderate Sedation:      MAC sedation used Recommendation:           - Return patient to hospital ward for ongoing care.                           - Full liquid diet and advance as tolerated.                           - Continue present medications. Patient is on                            standing doses of rotating metoclopramide and                            ondansetron with limited improvement in last 48                            hours of hospitalization.                           Optimal glucose control is essential for long-term                            management of his gastroparesis.                            If medical management is unable to control this                            patient's vomiting to allow oral nutrition, then                            consideration must be given to placement of a                            surgical J-tube and perhaps outpatient evaluation                            at an academic center specializing in upper GI                            dysmotility. Procedure Code(s):        --- Professional ---                           [id number], Esophagogastroduodenoscopy, flexible,                            transoral; diagnostic, including collection of  specimen(s) by brushing or washing, when performed                            (separate procedure) Diagnosis Code(s):        --- Professional ---                           R11.2, Nausea with vomiting, unspecified CPT copyright 2022 American Medical Association. All rights reserved. The codes documented in this report are preliminary and upon coder review may  be revised to meet current compliance requirements. Jaqueline Uber L. Loletha Carrow, MD 10/27/2022 7:57:33 AM This report has been signed electronically. Number of Addenda: 0

## 2022-10-27 NOTE — Plan of Care (Signed)
  Problem: Nutritional: Goal: Maintenance of adequate nutrition will improve Outcome: Progressing   Problem: Education: Goal: Knowledge of General Education information will improve Description: Including pain rating scale, medication(s)/side effects and non-pharmacologic comfort measures Outcome: Progressing   Problem: Pain Managment: Goal: General experience of comfort will improve Outcome: Progressing   Problem: Safety: Goal: Ability to remain free from injury will improve Outcome: Progressing   

## 2022-10-27 NOTE — Progress Notes (Signed)
[facility]                                                                               Edward Walls, is a 42 y.o. male, DOB - 07-07-1981, FIE:[id number] Admit date - 10/25/2022    Outpatient Primary MD for the patient is Edward Maw, MD  LOS - 1  days    Brief summary     Edward Walls is a 42 y.o. male with medical history significant of lower extremity weakness, seasonal allergies, type 1 diabetes mellitus, recently admitted last month for DKA who is coming to the emergency department referred by GI clinic due to dyspnea on exertion for the past month associated with abdominal pain, nausea and vomiting for the past week.    He was admitted for evaluation and management of persistent nausea and vomiting.   Assessment & Plan    Assessment and Plan:   Persistent Nausea and vomiting  Differential include DKA vs gastroparesis vs viral gastroenteritis vs gastritis.  DKA ruled out.  Will need outpatient gastrict emptying study.  GI on board and  pt underwent EGD, which was wnl.  Symptomatic management with IV fluids,  IV Reglan, IV Protonix. IV phenergan.  CT abd and pelvis wnl.     Type 1 DM,  On insulin pump at home. Currently on SSI.  CBG (last 3)  Recent Labs    10/26/22 2230 10/27/22 0702 10/27/22 0854  GLUCAP 187* 169* 180*   A1c is 10.3 Continue with SSI for now and semglee 15 units daily.       Hypokalemia:  Replaced.    Severe protein calorie malnutrition.  Supplementation will be added.     Estimated body mass index is 17.5 kg/m as calculated from the following:   Height as of this encounter: 6\' 3"  (1.905 m).   Weight as of this encounter: 63.5 kg.  Code Status: full code.  DVT Prophylaxis:  SCDs Start: 10/25/22 1600   Level of Care: Level of care: Med-Surg Family Communication: none at bedside.   Disposition Plan:     Remains inpatient appropriate:  still symptomatic with nausea. EGD int he next 24 hours.    Procedures:  EGD to be scheduled.   Consultants:   GI  Antimicrobials:   Anti-infectives (From admission, onward)    None        Medications  Scheduled Meds:  feeding supplement (GLUCERNA SHAKE)  237 mL Oral TID BM   gabapentin  600 mg Oral TID   insulin aspart  0-9 Units Subcutaneous TID WC   insulin glargine-yfgn  15 Units Subcutaneous Daily   metoCLOPramide (REGLAN) injection  10 mg Intravenous Q6H   ondansetron  4 mg Intravenous Q6H   pantoprazole (PROTONIX) IV  40 mg Intravenous Daily   Continuous Infusions:  0.9 % NaCl with KCl 20 mEq / L 100 mL/hr at 10/27/22 0308   promethazine (PHENERGAN) injection (IM or IVPB) 6.25 mg (10/26/22 1611)   PRN Meds:.acetaminophen **OR** acetaminophen, HYDROmorphone (DILAUDID) injection, promethazine (PHENERGAN) injection (IM or IVPB)    Subjective:   [name] was seen and examined today.  No nausea or vomiting today. Feels weak  Objective:   Vitals:   10/27/22 0803 10/27/22 0810 10/27/22 0816 10/27/22 0843  BP:  (!) 134/95 (!) 127/92 (!) 127/91  Pulse: 87 84 86 71  Resp: 12 16 20 16   Temp: 97.6 F (36.4 C)   98.6 F (37 C)  TempSrc: Temporal   Oral  SpO2: 100% 99% 99% 100%  Weight:      Height:        Intake/Output Summary (Last 24 hours) at 10/27/2022 0902 Last data filed at 10/27/2022 0748 Gross per 24 hour  Intake 2538.17 ml  Output 1725 ml  Net 813.17 ml    Filed Weights   10/25/22 2232 10/27/22 0702  Weight: 67.1 kg 63.5 kg     Exam General exam: Appears calm and comfortable  Respiratory system: Clear to auscultation. Respiratory effort normal. Cardiovascular system: S1 & S2 heard, RRR. No JVD,  No pedal edema. Gastrointestinal system: Abdomen is nondistended, soft and nontender. Central nervous system: Alert and oriented. No focal neurological deficits. Extremities: Symmetric 5 x 5 power. Skin: No rashes, lesions or ulcers Psychiatry: Mood & affect appropriate.     Data Reviewed:  I  have personally reviewed following labs and imaging studies   CBC Lab Results  Component Value Date   WBC 4.2 10/26/2022   RBC 4.46 10/26/2022   HGB 12.3 (L) 10/26/2022   HCT 38.0 (L) 10/26/2022   MCV 85.2 10/26/2022   MCH 27.6 10/26/2022   PLT 271 10/26/2022   MCHC 32.4 10/26/2022   RDW 12.6 10/26/2022   LYMPHSABS 1.7 10/25/2022   MONOABS 0.4 10/25/2022   EOSABS 0.0 10/25/2022   BASOSABS 0.0 10/25/2022     Last metabolic panel Lab Results  Component Value Date   NA 136 10/26/2022   K 4.1 10/26/2022   CL 100 10/26/2022   CO2 26 10/26/2022   BUN 12 10/26/2022   CREATININE 0.77 10/26/2022   GLUCOSE 197 (H) 10/26/2022   GFRNONAA >60 10/26/2022   GFRAA >60 06/07/2020   CALCIUM 8.1 (L) 10/26/2022   PHOS 2.5 09/28/2022   PROT 4.8 (L) 10/26/2022   ALBUMIN 3.0 (L) 10/26/2022   LABGLOB 2.9 12/14/2020   BILITOT 3.1 (H) 10/26/2022   ALKPHOS 37 (L) 10/26/2022   AST 12 (L) 10/26/2022   ALT 15 10/26/2022   ANIONGAP 10 10/26/2022    CBG (last 3)  Recent Labs    10/26/22 2230 10/27/22 0702 10/27/22 0854  GLUCAP 187* 169* 180*       Coagulation Profile: No results for input(s): "INR", "PROTIME" in the last 168 hours.   Radiology Studies: CT ABDOMEN PELVIS W CONTRAST  Result Date: 10/25/2022 CLINICAL DATA:  Shortness of breath with exertion. Abdominal pain with nausea vomiting. EXAM: CT ABDOMEN AND PELVIS WITH CONTRAST TECHNIQUE: Multidetector CT imaging of the abdomen and pelvis was performed using the standard protocol following bolus administration of intravenous contrast. RADIATION DOSE REDUCTION: This exam was performed according to the departmental dose-optimization program which includes automated exposure control, adjustment of the mA and/or kV according to patient size and/or use of iterative reconstruction technique. CONTRAST:  12/24/2022 OMNIPAQUE IOHEXOL 300 MG/ML  SOLN COMPARISON:  09/27/2022. FINDINGS: Lower chest: Clear lung bases. Hepatobiliary: No focal liver  abnormality is seen. No gallstones, gallbladder wall thickening, or biliary dilatation. Pancreas: Unremarkable. No pancreatic ductal dilatation or surrounding inflammatory changes. Spleen: Normal in size without focal abnormality. Adrenals/Urinary Tract: Adrenal glands are unremarkable. Kidneys are normal, without renal calculi, focal lesion, or hydronephrosis. Bladder is unremarkable. Stomach/Bowel: Bowel assessment  somewhat limited by lack mesenteric fat. Allowing for this, stomach is unremarkable. Small bowel and colon are normal in caliber. No wall thickening or convincing inflammation. Appendix not definitively seen, but no findings of acute appendicitis. Vascular/Lymphatic: No significant vascular findings are present. No enlarged abdominal or pelvic lymph nodes. Reproductive: Unremarkable. Other: No abdominal wall hernia or abnormality. No abdominopelvic ascites. Musculoskeletal: Normal. IMPRESSION: 1. Normal CT scan of the abdomen and pelvis with contrast. No findings to account for the patient's abdominal pain or nausea and vomiting. Electronically Signed   By: Lajean Manes M.D.   On: 10/25/2022 13:33   DG Chest 2 View  Result Date: 10/25/2022 CLINICAL DATA:  Dyspnea.  Shortness of breath. EXAM: CHEST - 2 VIEW COMPARISON:  AP chest 05/24/2022 FINDINGS: Cardiac silhouette and mediastinal contours are within normal limits. The lungs are clear. No pleural effusion or pneumothorax. There is again mild dextrocurvature of the mid to lower thoracic spine. IMPRESSION: No active cardiopulmonary disease. Electronically Signed   By: Yvonne Kendall M.D.   On: 10/25/2022 10:22       Hosie Poisson M.D. [facility] 10/27/2022, 9:02 AM  Available via Epic secure chat 7am-7pm After 7 pm, please refer to night coverage provider listed on amion.

## 2022-10-27 NOTE — Anesthesia Postprocedure Evaluation (Signed)
Anesthesia Post Note  Patient: Edward Walls  Procedure(s) Performed: ESOPHAGOGASTRODUODENOSCOPY (EGD) WITH PROPOFOL     Patient location during evaluation: Endoscopy Anesthesia Type: MAC Level of consciousness: oriented, awake and alert and awake Pain management: pain level controlled Vital Signs Assessment: post-procedure vital signs reviewed and stable Respiratory status: spontaneous breathing, nonlabored ventilation, respiratory function stable and patient connected to nasal cannula oxygen Cardiovascular status: blood pressure returned to baseline and stable Postop Assessment: no headache, no backache and no apparent nausea or vomiting Anesthetic complications: no   No notable events documented.  Last Vitals:  Vitals:   10/27/22 0843 10/27/22 1340  BP: (!) 127/91 123/76  Pulse: 71 98  Resp: 16 17  Temp: 37 C 37.5 C  SpO2: 100% 100%    Last Pain:  Vitals:   10/27/22 1340  TempSrc: Oral  PainSc:                  [name]

## 2022-10-27 NOTE — Progress Notes (Signed)
Initial Nutrition Assessment  DOCUMENTATION CODES:   Severe malnutrition in context of acute illness/injury  INTERVENTION:  - Full Liquid diet per MD, advance as tolerated.  - Glucerna Shake po TID, each supplement provides 220 kcal and 10 grams of protein - Daily multivitamin to support micronutrient needs.  - Monitor weight trends.    NUTRITION DIAGNOSIS:   Severe Malnutrition related to acute illness as evidenced by moderate fat depletion, moderate muscle depletion, energy intake < or equal to 50% for > or equal to 5 days, percent weight loss (8.5% weight loss in 3 months).  GOAL:   Patient will meet greater than or equal to 90% of their needs  MONITOR:   PO intake, Supplement acceptance, Diet advancement, Weight trends  REASON FOR ASSESSMENT:   Malnutrition Screening Tool    ASSESSMENT:   42 y.o. male with type 1 diabetes who presented with 1 month history of daily nausea and vomiting, inability to keep down p.o.'s and weight loss. Had recent admission mid December 2023 with DKA and patient says symptoms never really resolved after that admission and a has had persistent daily nausea and vomiting. Concern for diabetic gastroparesis.  Patient reports UBW of 150# and weight loss over the past 2 weeks due to N/V. Per EMR, patient weighed at 153# in October and now weighed at 140# - a 13# or 8.5% weight loss in 3 months, significant for the time frame.  Patient endorses prior to N/V began he would eat well with 3 meals a day and good appetite. Since N/V began a few weeks ago he hasn't been able to keep anything down. Since admission, he has been mostly on clear liquids and taking in a little. Notes he didn't have a breakfast today. Thankfully, he reports his appetite is slowly improving and he is hoping to start taking in more full liquids today. He is ordered Glucerna and endorses drinking one this morning and enjoying it. Discussed drinking Glucerna when able for more caloric  and protein dense liquid options and trying to increase intake at each meal.    Medications reviewed and include: Insulin, Reglan, Zofran  Labs reviewed:  HA1C 10.6 Blood Glucose 143-239 x24 hours   NUTRITION - FOCUSED PHYSICAL EXAM:  Flowsheet Row Most Recent Value  Orbital Region Mild depletion  Upper Arm Region Moderate depletion  Thoracic and Lumbar Region Moderate depletion  Buccal Region Mild depletion  Temple Region Mild depletion  Clavicle Bone Region Moderate depletion  Clavicle and Acromion Bone Region Moderate depletion  Scapular Bone Region Unable to assess  Dorsal Hand Mild depletion  Patellar Region Mild depletion  Anterior Thigh Region Mild depletion  Posterior Calf Region Mild depletion  Edema (RD Assessment) None  Hair Reviewed  Eyes Reviewed  Mouth Reviewed  Skin Reviewed  Nails Reviewed       Diet Order:   Diet Order             Diet full liquid Room service appropriate? Yes; Fluid consistency: Thin  Diet effective now                   EDUCATION NEEDS:  Education needs have been addressed  Skin:  Skin Assessment: Reviewed RN Assessment  Last BM:  1/12  Height:  Ht Readings from Last 1 Encounters:  10/27/22 6\' 3"  (1.905 m)   Weight:  Wt Readings from Last 1 Encounters:  10/27/22 63.5 kg   BMI:  Body mass index is 17.5 kg/m.  Estimated Nutritional Needs:  Kcal:  2100-2300 kcals Protein:  90-100 grams Fluid:  >/= 2.1L    Samson Frederic RD, LDN For contact information, refer to [facility].

## 2022-10-27 NOTE — Inpatient Diabetes Management (Signed)
Inpatient Diabetes Program Recommendations  AACE/ADA: New Consensus Statement on Inpatient Glycemic Control (2015)  Target Ranges:  Prepandial:   less than 140 mg/dL      Peak postprandial:   less than 180 mg/dL (1-2 hours)      Critically ill patients:  140 - 180 mg/dL   Lab Results  Component Value Date   GLUCAP 180 (H) 10/27/2022   HGBA1C 10.6 (H) 09/29/2022    Review of Glycemic Control  Diabetes history: DM1 Outpatient Diabetes medications: Insulin pump Current orders for Inpatient glycemic control: Semglee 15 QD, Novolog 0-9 TID  HgbA1C - 10.6% CBGs 169, 180 mg/dL  Familiar with pt from previous admissions. Spoke with pt on 1/11 in afternoon regarding his insulin pump and issues with nausea and vomiting.  For EGD this am, therefore was NPO after MN  Inpatient Diabetes Program Recommendations:    Increase Semglee to 20 units QD  Add meal coverage - Novolog 4 units TID with meals (while on FL diet)  If eating regular meal - will need at least 7-8 units of Novolog per meal  Will go back on insulin pump when discharged, hold basal rate until 24H after last Semglee dose. Can restart pump with bolus (correction and meal coverage) anytime.  Continue to follow.  Thank you. Lorenda Peck, RD, LDN, [place] Inpatient Diabetes Coordinator [phone]

## 2022-10-27 NOTE — Transfer of Care (Signed)
Immediate Anesthesia Transfer of Care Note  Patient: Edward Walls  Procedure(s) Performed: ESOPHAGOGASTRODUODENOSCOPY (EGD) WITH PROPOFOL  Patient Location: PACU  Anesthesia Type:MAC  Level of Consciousness: drowsy and patient cooperative  Airway & Oxygen Therapy: Patient Spontanous Breathing and Patient connected to face mask oxygen  Post-op Assessment: Report given to RN and Post -op Vital signs reviewed and stable  Post vital signs: Reviewed and stable  Last Vitals:  Vitals Value Taken Time  BP    Temp    Pulse    Resp    SpO2      Last Pain:  Vitals:   10/27/22 0706  TempSrc: Temporal  PainSc:          Complications: No notable events documented.

## 2022-10-27 NOTE — Plan of Care (Signed)
  Problem: Metabolic: Goal: Ability to maintain appropriate glucose levels will improve Outcome: Progressing   Problem: Education: Goal: Knowledge of General Education information will improve Description: Including pain rating scale, medication(s)/side effects and non-pharmacologic comfort measures 10/27/2022 0252 by Abagail Kitchens, RN Outcome: Progressing 10/27/2022 0250 by Abagail Kitchens, RN Outcome: Progressing   Problem: Pain Managment: Goal: General experience of comfort will improve 10/27/2022 0252 by Abagail Kitchens, RN Outcome: Progressing 10/27/2022 0250 by Abagail Kitchens, RN Outcome: Progressing   Problem: Safety: Goal: Ability to remain free from injury will improve 10/27/2022 0252 by Abagail Kitchens, RN Outcome: Progressing 10/27/2022 0250 by Abagail Kitchens, RN Outcome: Progressing

## 2022-10-28 DIAGNOSIS — R112 Nausea with vomiting, unspecified: Secondary | ICD-10-CM | POA: Diagnosis not present

## 2022-10-28 DIAGNOSIS — E43 Unspecified severe protein-calorie malnutrition: Secondary | ICD-10-CM | POA: Diagnosis not present

## 2022-10-28 DIAGNOSIS — E1043 Type 1 diabetes mellitus with diabetic autonomic (poly)neuropathy: Secondary | ICD-10-CM | POA: Diagnosis not present

## 2022-10-28 DIAGNOSIS — E876 Hypokalemia: Secondary | ICD-10-CM | POA: Diagnosis not present

## 2022-10-28 LAB — BASIC METABOLIC PANEL
Anion gap: 6 (ref 5–15)
BUN: 6 mg/dL (ref 6–20)
CO2: 31 mmol/L (ref 22–32)
Calcium: 8.7 mg/dL — ABNORMAL LOW (ref 8.9–10.3)
Chloride: 99 mmol/L (ref 98–111)
Creatinine, Ser: 0.92 mg/dL (ref 0.61–1.24)
GFR, Estimated: >60 mL/min (ref >60)
Glucose, Bld: 256 mg/dL — ABNORMAL HIGH (ref 70–99)
Potassium: 3.6 mmol/L (ref 3.5–5.1)
Sodium: 136 mmol/L (ref 135–145)

## 2022-10-28 LAB — CBC WITH DIFFERENTIAL/PLATELET
Abs Immature Granulocytes: 0 10*3/uL (ref 0.00–0.07)
Basophils Absolute: 0 10*3/uL (ref 0.0–0.1)
Basophils Relative: 0 %
Eosinophils Absolute: 0.2 10*3/uL (ref 0.0–0.5)
Eosinophils Relative: 4 %
HCT: 41.4 % (ref 39.0–52.0)
Hemoglobin: 13.4 g/dL (ref 13.0–17.0)
Immature Granulocytes: 0 %
Lymphocytes Relative: 56 %
Lymphs Abs: 2.6 10*3/uL (ref 0.7–4.0)
MCH: 27.3 pg (ref 26.0–34.0)
MCHC: 32.4 g/dL (ref 30.0–36.0)
MCV: 84.3 fL (ref 80.0–100.0)
Monocytes Absolute: 0.3 10*3/uL (ref 0.1–1.0)
Monocytes Relative: 7 %
Neutro Abs: 1.5 10*3/uL — ABNORMAL LOW (ref 1.7–7.7)
Neutrophils Relative %: 33 %
Platelets: 286 10*3/uL (ref 150–400)
RBC: 4.91 MIL/uL (ref 4.22–5.81)
RDW: 12.7 % (ref 11.5–15.5)
WBC: 4.7 10*3/uL (ref 4.0–10.5)
nRBC: 0 % (ref 0.0–0.2)

## 2022-10-28 LAB — GLUCOSE, CAPILLARY
Glucose-Capillary: 293 mg/dL — ABNORMAL HIGH (ref 70–99)
Glucose-Capillary: 337 mg/dL — ABNORMAL HIGH (ref 70–99)
Glucose-Capillary: 147 mg/dL — ABNORMAL HIGH (ref 70–99)

## 2022-10-28 MED ORDER — GLUCERNA SHAKE PO LIQD: 237.0000 mL | Freq: Three times a day (TID) | ORAL | 2 refills | Status: AC

## 2022-10-28 MED ORDER — ONDANSETRON HCL 4 MG PO TABS: 4.0000 mg | ORAL_TABLET | Freq: Three times a day (TID) | ORAL | 0 refills | Status: DC | PRN

## 2022-10-28 MED ORDER — PANTOPRAZOLE SODIUM 40 MG PO TBEC: 40.0000 mg | DELAYED_RELEASE_TABLET | Freq: Every day | ORAL | 11 refills | Status: DC

## 2022-10-28 NOTE — Progress Notes (Signed)
The patient is alert and oriented and has been seen by his physician. The orders for discharge were written. IV has been removed. Went over discharge instructions with patient. He is being walked down to his ride by nurse tech with all of his belongings.

## 2022-10-28 NOTE — Progress Notes (Signed)
Subjective: No complaints.  Feeling well.  Objective: Vital signs in last 24 hours: Temp:  [97.8 F (36.6 C)-99.5 F (37.5 C)] 97.8 F (36.6 C) (01/13 0515) Pulse Rate:  [83-98] 83 (01/13 0515) Resp:  [16-18] 16 (01/13 0515) BP: (112-123)/(73-88) 115/88 (01/13 0515) SpO2:  [96 %-100 %] 100 % (01/13 0515) Last BM Date : 10/27/22  Intake/Output from previous day: 01/12 0701 - 01/13 0700 In: 1849 [P.O.:1494; I.V.:355] Out: 950 [Urine:950] Intake/Output this shift: Total I/O In: 240 [P.O.:240] Out: -   General appearance: alert and no distress GI: soft, non-tender; bowel sounds normal; no masses,  no organomegaly  Lab Results: Recent Labs    10/26/22 0346 10/28/22 0454  WBC 4.2 4.7  HGB 12.3* 13.4  HCT 38.0* 41.4  PLT 271 286   BMET Recent Labs    10/26/22 0346 10/28/22 0454  NA 136 136  K 4.1 3.6  CL 100 99  CO2 26 31  GLUCOSE 197* 256*  BUN 12 6  CREATININE 0.77 0.92  CALCIUM 8.1* 8.7*   LFT Recent Labs    10/26/22 0346  PROT 4.8*  ALBUMIN 3.0*  AST 12*  ALT 15  ALKPHOS 37*  BILITOT 3.1*   PT/INR No results for input(s): "LABPROT", "INR" in the last 72 hours. Hepatitis Panel No results for input(s): "HEPBSAG", "HCVAB", "HEPAIGM", "HEPBIGM" in the last 72 hours. C-Diff No results for input(s): "CDIFFTOX" in the last 72 hours. Fecal Lactopherrin No results for input(s): "FECLLACTOFRN" in the last 72 hours.  Studies/Results: No results found.  Medications: Scheduled:  feeding supplement (GLUCERNA SHAKE)  237 mL Oral TID BM   gabapentin  600 mg Oral TID   insulin aspart  0-9 Units Subcutaneous TID WC   insulin glargine-yfgn  15 Units Subcutaneous Daily   metoCLOPramide (REGLAN) injection  10 mg Intravenous Q6H   multivitamin with minerals  1 tablet Oral Daily   ondansetron  4 mg Intravenous Q6H   pantoprazole (PROTONIX) IV  40 mg Intravenous Daily   Continuous:  promethazine (PHENERGAN) injection (IM or IVPB) 6.25 mg (10/26/22 1611)     Assessment/Plan: 1) Nausea/vomiting - resolved. 2) Diabetic gastroparesis.   He reports that he is back to his baseline.  He is not sure how it all happened, but he reports feeling "great".  His entire regular breakfast was consumed and he is looking forward to his lunch.  The patient is eager to go home.  Plan: 1) Okay to D/C home. 2) Continue with Reglan and Zofran. 3) Pantoprazole 40 mg QD is fine. 4) Follow up with [facility].    LOS: 2 days   Kiki Bivens D 10/28/2022, 12:34 PM

## 2022-10-28 NOTE — Plan of Care (Signed)

## 2022-10-30 ENCOUNTER — Encounter (HOSPITAL_COMMUNITY): Payer: Self-pay | Admitting: Gastroenterology

## 2022-10-30 ENCOUNTER — Telehealth: Payer: Self-pay

## 2022-10-30 NOTE — Telephone Encounter (Signed)
Transition Care Management Unsuccessful Follow-up Telephone Call  Date of discharge and from where:  [facility] 1.13.24  Attempts:  1st Attempt  Reason for unsuccessful TCM follow-up call:  Left voice message

## 2022-10-30 NOTE — Discharge Summary (Signed)
Physician Discharge Summary   Patient: Edward Walls MRN: [id number] DOB: 22-Jun-1981  Admit date:     10/25/2022  Discharge date: 10/28/2022  Discharge Physician: Hosie Poisson   PCP: [name], MD   Recommendations at discharge:  Please follow up with PCP in one week.  Please follow up with cbc and bmp in one week.  Please follow up with Gi as scheduled.   Discharge Diagnoses: Principal Problem:   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 ([place]) Active Problems:   Intractable nausea and vomiting   GERD without esophagitis   Hypokalemia due to excessive gastrointestinal loss of potassium   Nausea & vomiting   Abnormal loss of weight   Dyslipidemia   Hyperbilirubinemia   Protein-calorie malnutrition, severe    Hospital Course:  Edward Walls is a 42 y.o. male with medical history significant of lower extremity weakness, seasonal allergies, type 1 diabetes mellitus, recently admitted last month for DKA who is coming to the emergency department referred by GI clinic due to dyspnea on exertion for the past month associated with abdominal pain, nausea and vomiting for the past week.    He was admitted for evaluation and management of persistent nausea and vomiting.  Assessment and Plan:  Persistent Nausea and vomiting  Differential include DKA vs gastroparesis vs viral gastroenteritis vs gastritis.  DKA ruled out.  Will need outpatient gastrict emptying study.  GI on board and  pt underwent EGD, which was wnl.  Patient's diet advanced as tolerated. He was discharged home to follow up with GI in one week.  CT abd and pelvis wnl.        Type 1 DM,  On insulin pump at home.  Resume the same on discharge.      Hypokalemia:  Replaced.      Severe protein calorie malnutrition.  Supplementation will be added.    Estimated body mass index is 17.5 kg/m as calculated from the following:   Height as of this encounter: 6\' 3"  (1.905  m).   Weight as of this encounter: 63.5 kg.        Consultants: GI Procedures performed: EGD  Disposition: Home Diet recommendation:  Discharge Diet Orders (From admission, onward)     Start     Ordered   10/28/22 0000  Diet - low sodium heart healthy        10/28/22 1333           Carb modified diet DISCHARGE MEDICATION: Allergies as of 10/28/2022       Reactions   Other Other (See Comments)   All nuts Throat pain   Peanut-containing Drug Products Other (See Comments)   Throat pain        Medication List     TAKE these medications    Dexcom G6 Transmitter Misc 1 Device by Does not apply route every 3 (three) months.   Dexcom G6 Transmitter Misc 1 Device by Does not apply route every 3 (three) months.   docusate sodium 100 MG capsule Commonly known as: COLACE Take 1 capsule (100 mg total) by mouth 2 (two) times daily as needed for mild constipation.   feeding supplement (GLUCERNA SHAKE) Liqd Take 237 mLs by mouth 3 (three) times daily between meals.   FreeStyle Libre 2 Sensor Misc CHANGE SENSOR EVERY 14 DAYS   Dexcom G6 Sensor Misc Replace every 10 days   gabapentin 300 MG capsule Commonly known as: NEURONTIN Take 2 capsules (600 mg total) by mouth  3 (three) times daily.   insulin lispro 100 UNIT/ML injection Commonly known as: HUMALOG Inject 20 Units into the skin 3 (three) times daily with meals. Using in PUMP   Insulin Pen Needle 32G X 4 MM Misc Use 4-5x a day   Lyumjev 100 UNIT/ML Soln Generic drug: Insulin Lispro-aabc Inject 80 Units into the skin daily. Via pump.   metoCLOPramide 10 MG tablet Commonly known as: Reglan Take 1 tablet (10 mg total) by mouth 3 (three) times daily as needed for nausea.   Omnipod 5 G6 Pod (Gen 5) Misc 1 each by Does not apply route every 3 (three) days.   Omnipod 5 G6 Intro (Gen 5) Kit 1 each by Does not apply route as needed.   ondansetron 4 MG tablet Commonly known as: Zofran Take 1 tablet (4 mg  total) by mouth every 8 (eight) hours as needed for nausea or vomiting.   pantoprazole 40 MG tablet Commonly known as: Protonix Take 1 tablet (40 mg total) by mouth daily.   polyethylene glycol 17 g packet Commonly known as: MIRALAX / GLYCOLAX Take 17 g by mouth daily as needed for moderate constipation.   promethazine 25 MG tablet Commonly known as: PHENERGAN Take 1 tablet (25 mg total) by mouth every 6 (six) hours as needed for nausea or vomiting.        Follow-up Information     Schedule an appointment as soon as possible for a visit  with  Gastroenterology.   Specialty: Gastroenterology Contact information: [address] [place] [address] [phone]               Discharge Exam: Ceasar Mons Weights   10/25/22 2232 10/27/22 0702  Weight: 67.1 kg 63.5 kg   General exam: Appears calm and comfortable  Respiratory system: Clear to auscultation. Respiratory effort normal. Cardiovascular system: S1 & S2 heard, RRR. No JVD, murmurs, rubs, gallops or clicks. No pedal edema. Gastrointestinal system: Abdomen is nondistended, soft and nontender. No organomegaly or masses felt. Normal bowel sounds heard. Central nervous system: Alert and oriented. No focal neurological deficits. Extremities: Symmetric 5 x 5 power. Skin: No rashes, lesions or ulcers Psychiatry: Judgement and insight appear normal. Mood & affect appropriate.    Condition at discharge: fair  The results of significant diagnostics from this hospitalization (including imaging, microbiology, ancillary and laboratory) are listed below for reference.   Imaging Studies: CT ABDOMEN PELVIS W CONTRAST  Result Date: 10/25/2022 CLINICAL DATA:  Shortness of breath with exertion. Abdominal pain with nausea vomiting. EXAM: CT ABDOMEN AND PELVIS WITH CONTRAST TECHNIQUE: Multidetector CT imaging of the abdomen and pelvis was performed using the standard protocol following bolus administration of  intravenous contrast. RADIATION DOSE REDUCTION: This exam was performed according to the departmental dose-optimization program which includes automated exposure control, adjustment of the mA and/or kV according to patient size and/or use of iterative reconstruction technique. CONTRAST:  OMNIPAQUE IOHEXOL 300 MG/ML  SOLN COMPARISON:  09/27/2022. FINDINGS: Lower chest: Clear lung bases. Hepatobiliary: No focal liver abnormality is seen. No gallstones, gallbladder wall thickening, or biliary dilatation. Pancreas: Unremarkable. No pancreatic ductal dilatation or surrounding inflammatory changes. Spleen: Normal in size without focal abnormality. Adrenals/Urinary Tract: Adrenal glands are unremarkable. Kidneys are normal, without renal calculi, focal lesion, or hydronephrosis. Bladder is unremarkable. Stomach/Bowel: Bowel assessment somewhat limited by lack mesenteric fat. Allowing for this, stomach is unremarkable. Small bowel and colon are normal in caliber. No wall thickening or convincing inflammation. Appendix not definitively seen,  but no findings of acute appendicitis. Vascular/Lymphatic: No significant vascular findings are present. No enlarged abdominal or pelvic lymph nodes. Reproductive: Unremarkable. Other: No abdominal wall hernia or abnormality. No abdominopelvic ascites. Musculoskeletal: Normal. IMPRESSION: 1. Normal CT scan of the abdomen and pelvis with contrast. No findings to account for the patient's abdominal pain or nausea and vomiting. Electronically Signed   By: Lajean Manes M.D.   On: 10/25/2022 13:33   DG Chest 2 View  Result Date: 10/25/2022 CLINICAL DATA:  Dyspnea.  Shortness of breath. EXAM: CHEST - 2 VIEW COMPARISON:  AP chest 05/24/2022 FINDINGS: Cardiac silhouette and mediastinal contours are within normal limits. The lungs are clear. No pleural effusion or pneumothorax. There is again mild dextrocurvature of the mid to lower thoracic spine. IMPRESSION: No active cardiopulmonary  disease. Electronically Signed   By: Yvonne Kendall M.D.   On: 10/25/2022 10:22    Microbiology: Results for orders placed or performed during the hospital encounter of 05/24/22  MRSA Next Gen by PCR, Nasal     Status: None   Collection Time: 05/24/22  3:14 PM   Specimen: Nasal Mucosa; Nasal Swab  Result Value Ref Range Status   MRSA by PCR Next Gen NOT DETECTED NOT DETECTED Final    Comment: (NOTE) The GeneXpert MRSA Assay (FDA approved for NASAL specimens only), is one component of a comprehensive MRSA colonization surveillance program. It is not intended to diagnose MRSA infection nor to guide or monitor treatment for MRSA infections. Test performance is not FDA approved in patients less than 52 years old. Performed at [facility], [place] [address], [place], [place] [address]     Labs: CBC: Recent Labs  Lab 10/25/22 [phone] 10/26/22 0346 10/28/22 0454  WBC 4.4 4.2 4.7  NEUTROABS 2.3  --  1.5*  HGB 15.7 12.3* 13.4  HCT 48.4 38.0* 41.4  MCV 84.2 85.2 84.3  PLT 371 271 025   Basic Metabolic Panel: Recent Labs  Lab 10/25/22 0938 10/26/22 0346 10/28/22 0454  NA 135 136 136  K 3.0* 4.1 3.6  CL 97* 100 99  CO2 26 26 31   GLUCOSE 179* 197* 256*  BUN 19 12 6   CREATININE 0.91 0.77 0.92  CALCIUM 9.0 8.1* 8.7*   Liver Function Tests: Recent Labs  Lab 10/25/22 0938 10/26/22 0346  AST 21 12*  ALT 22 15  ALKPHOS 54 37*  BILITOT 4.3* 3.1*  PROT 7.6 4.8*  ALBUMIN 4.3 3.0*   CBG: Recent Labs  Lab 10/27/22 1636 10/27/22 2143 10/28/22 0736 10/28/22 1206 10/28/22 1640  GLUCAP 137* 266* 293* 337* 147*    Discharge time spent: 42 minutes.   Signed: Hosie Poisson, MD [facility] 10/30/2022

## 2022-10-31 NOTE — Telephone Encounter (Signed)
Transition Care Management Unsuccessful Follow-up Telephone Call  Date of discharge and from where:  [facility] 1.13.24  Attempts:  2nd Attempt  Reason for unsuccessful TCM follow-up call:  Left voice message

## 2022-11-02 ENCOUNTER — Encounter: Payer: Self-pay | Admitting: Internal Medicine

## 2022-11-02 ENCOUNTER — Ambulatory Visit (INDEPENDENT_AMBULATORY_CARE_PROVIDER_SITE_OTHER): Payer: Managed Care, Other (non HMO) | Admitting: Internal Medicine

## 2022-11-02 VITALS — BP 120/70 | HR 121 | Ht 75.0 in | Wt 149.0 lb

## 2022-11-02 DIAGNOSIS — E1010 Type 1 diabetes mellitus with ketoacidosis without coma: Secondary | ICD-10-CM

## 2022-11-02 DIAGNOSIS — E785 Hyperlipidemia, unspecified: Secondary | ICD-10-CM | POA: Diagnosis not present

## 2022-11-02 NOTE — Patient Instructions (Signed)
Please use the following pump settings: - Basal rates: 12 am: 1.25  >> 1.35 units/h - Insulin to carb ratio: 12 am: 1 - Target: 12 am: 140 - Correction factor (insulin sensitivity factor):  12 am: 28 - Active insulin time: 4h  Enter between 6-12 g carbs in the pump and start the boluses right before meals.  Please return in 2  months.

## 2022-11-02 NOTE — Progress Notes (Signed)
Patient ID: Edward Walls, male   DOB: 09/17/1981, 42 y.o.   MRN: [id number]    HPI: Edward Walls is a 42 y.o.-year-old male, returning for follow-up for DM1, dx in 2017, insulin-dependent, uncontrolled, with complications (recurrent DKA episodes).  Last visit 3 months ago.  Interim history: No increased urination, blurry vision, nausea, chest pain.  He contacted me last month with high blood sugars along with nausea and vomiting.  We discussed that if he could not take p.o., to go to the hospital.  He did go to the hospital and was found to be in DKA.  It was also found that he was using Lantus in the pump instead of Lyumjev.  Sugars improved after switching to the correct insulin. He continues to have nausea and vomiting.  He was admitted 10/25/2022 for the same symptoms and suspicion of gastroparesis.  Of note, an abdominal CT scan and an EGD were normal.  Gastric emptying study is pending.  Reviewed HbA1c levels: Lab Results  Component Value Date   HGBA1C 10.6 (H) 09/29/2022   HGBA1C 11.0 (A) 07/28/2022   HGBA1C >15.5 (H) 05/25/2022   HGBA1C >15 02/20/2022   HGBA1C >15 09/29/2021   HGBA1C 12.2 (A) 07/01/2021   HGBA1C 13.7 (A) 11/24/2020   HGBA1C >15.0 09/24/2020   HGBA1C >15.5 (H) 06/04/2020   HGBA1C 17.2 (H) 01/28/2020  07/05/2017: GAD Ab <5  Previously on:  - Tresiba 40 units daily (in the abdomen) >> 34 units daily >> OFF for 1 month - did not refill it!!! - Lyumjev 6-10 units >> 10-16 units, depending on the size of the meal (in the abdomen, thighs or upper arms) >> 18 units  - misses it seldom  At last visit was on: - Lantus 34 units daily  - Lyumjev 16-18 >> 8-12 units, depending on the size of the meal (in the abdomen, thighs or upper arms) - Lyumjev sliding scale: - 150-175: + 1 unit  - 176-200: + 2 units  - 201-225: + 3 units  - 226-250: + 4 units  - 251-275: + 5 units - 276-300: + 6 units >300: + 7 units  Since last visit, he started on OmniPod 5 insulin  pump - now Humalog in the pump: - Basal rates: 12 am: 1.25 units/h - Insulin to carb ratio: 12 am: 1 - Target: 12 am: 140 - Correction factor (insulin sensitivity factor):  12 am: 28 - Active insulin time: 4h - Changes infusion site: q3 days  Total daily dose from basal insulin: 79% Total daily dose from bolus insulin: 21% Total daily dose 17.5-30 units Number of boluses a day: 0.7! Stayed in the auto mode 91% of the time  He was previously on the freestyle libre CGM >> Dexcom G6 -no more irritation at the site after she learned to attach it correctly:  Previously:   Previously  Previously:   Lowest sugar was  320 >> 50s >> 90; he has hypoglycemia awareness in the 80s. Highest sugar was HI >> HI >> 300s. He had 1 previous DKA episode when he could not afford his insulin in the past.  Pt's meals are: - Breakfast: oatmeal + banana - Lunch: meatloaf, chilli, salad - Dinner: meat + veggies - Snacks: 8 pm  - crackers, at work -he works nights  No CKD, last BUN/creatinine:  Lab Results  Component Value Date   BUN 6 10/28/2022   BUN 12 10/26/2022   CREATININE 0.92 10/28/2022   CREATININE 0.77 10/26/2022  He has dyslipidemia: Lab Results  Component Value Date   CHOL 85 (L) 07/28/2022   HDL 39 (L) 07/28/2022   LDLCALC 37 07/28/2022   TRIG 26 07/28/2022   CHOLHDL 2.2 07/28/2022   -No previous eye exams. I recommended that he sees Dr. Prudencio Burly and gave him his information. He did not see him yet.  He cannot afford to see another doctor for now.  -He denies numbness and tingling in his feet.  Last foot exam was in 07/2022.  Pt has FH of DM in M, F, all 3 of his brothers - all have DM2.  ROS: + See HPI  I reviewed pt's medications, allergies, PMH, social hx, family hx, and changes were documented in the history of present illness. Otherwise, unchanged from my initial visit note.  Past Medical History:  Diagnosis Date   Diabetes mellitus without complication (HCC)     Leg pain    Leg weakness    Seasonal allergies    Past Surgical History:  Procedure Laterality Date   DENTAL SURGERY     ESOPHAGOGASTRODUODENOSCOPY (EGD) WITH PROPOFOL N/A 10/27/2022   Procedure: ESOPHAGOGASTRODUODENOSCOPY (EGD) WITH PROPOFOL;  Surgeon: Doran Stabler, MD;  Location: [facility] ENDOSCOPY;  Service: Gastroenterology;  Laterality: N/A;   Social History   Socioeconomic History   Marital status: Single    Spouse name: Not on file   Number of children:  To: 80 and 17 10/2008   Years of education: Not on file   Highest education level: Not on file  Occupational History    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   Food insecurity:    Worry: Not on file    Inability: Not on file   Transportation needs:    Medical: Not on file    Non-medical: Not on file  Tobacco Use   Smoking status: Former Smoker    Types: Cigarettes   Smokeless tobacco: Never Used  Substance and Sexual Activity   Alcohol use:  no    Alcohol/week:   Types:  Drug use: Not Currently   Sexual activity: Not on file  Lifestyle   Physical activity:    Days per week: Not on file    Minutes per session: Not on file   Stress: Not on file  Relationships   Social connections:    Talks on phone: Not on file    Gets together: Not on file    Attends religious service: Not on file    Active member of club or organization: Not on file    Attends meetings of clubs or organizations: Not on file    Relationship status: Not on file   Intimate partner violence:    Fear of current or ex partner: Not on file    Emotionally abused: Not on file    Physically abused: Not on file    Forced sexual activity: Not on file  Other Topics Concern   Not on file  Social History Narrative   Not on file   Current Outpatient Medications on File Prior to Visit  Medication Sig Dispense Refill   Continuous Blood Gluc Sensor (DEXCOM G6 SENSOR) MISC Replace every 10 days  9 each 3   Continuous Blood Gluc Sensor (FREESTYLE LIBRE 2 SENSOR) MISC CHANGE SENSOR EVERY 14 DAYS 6 each 2   Continuous Blood Gluc Transmit (DEXCOM G6 TRANSMITTER) MISC 1 Device by Does not apply route every 3 (three) months. 1 each  3   Continuous Blood Gluc Transmit (DEXCOM G6 TRANSMITTER) MISC 1 Device by Does not apply route every 3 (three) months. 1 each 3   docusate sodium (COLACE) 100 MG capsule Take 1 capsule (100 mg total) by mouth 2 (two) times daily as needed for mild constipation. 10 capsule 0   feeding supplement, GLUCERNA SHAKE, (GLUCERNA SHAKE) LIQD Take 237 mLs by mouth 3 (three) times daily between meals. 21330 mL 2   gabapentin (NEURONTIN) 300 MG capsule Take 2 capsules (600 mg total) by mouth 3 (three) times daily. 180 capsule 11   Insulin Disposable Pump (OMNIPOD 5 G6 INTRO, GEN 5,) KIT 1 each by Does not apply route as needed. 1 kit 0   Insulin Disposable Pump (OMNIPOD 5 G6 POD, GEN 5,) MISC 1 each by Does not apply route every 3 (three) days. 30 each 3   insulin lispro (HUMALOG) 100 UNIT/ML injection Inject 20 Units into the skin 3 (three) times daily with meals. Using in PUMP     Insulin Lispro-aabc (LYUMJEV) 100 UNIT/ML SOLN Inject 80 Units into the skin daily. Via pump. 10 mL 0   Insulin Pen Needle 32G X 4 MM MISC Use 4-5x a day 300 each 3   metoCLOPramide (REGLAN) 10 MG tablet Take 1 tablet (10 mg total) by mouth 3 (three) times daily as needed for nausea. 60 tablet 3   ondansetron (ZOFRAN) 4 MG tablet Take 1 tablet (4 mg total) by mouth every 8 (eight) hours as needed for nausea or vomiting. 20 tablet 0   pantoprazole (PROTONIX) 40 MG tablet Take 1 tablet (40 mg total) by mouth daily. 30 tablet 11   polyethylene glycol (MIRALAX / GLYCOLAX) 17 g packet Take 17 g by mouth daily as needed for moderate constipation. 14 each 0   promethazine (PHENERGAN) 25 MG tablet Take 1 tablet (25 mg total) by mouth every 6 (six) hours as needed for nausea or vomiting. 30 tablet 0   No  current facility-administered medications on file prior to visit.   Allergies  Allergen Reactions   Other Other (See Comments)    All nuts Throat pain   Peanut-Containing Drug Products Other (See Comments)    Throat pain   Family History  Problem Relation Age of Onset   Diabetes Mellitus II Mother    Diabetes Mellitus II Father    Diabetes Mellitus II Brother    PE: BP 120/70 (BP Location: Right Arm, Patient Position: Sitting, Cuff Size: Normal)   Pulse (!) 121   Ht 6\' 3"  (1.905 m)   Wt 149 lb (67.6 kg)   SpO2 99%   BMI 18.62 kg/m  Wt Readings from Last 3 Encounters:  11/02/22 149 lb (67.6 kg)  10/27/22 140 lb (63.5 kg)  10/25/22 139 lb (63 kg)        Constitutional: normal weight, in NAD Eyes:  EOMI, no exophthalmos ENT: no neck masses, no cervical lymphadenopathy Cardiovascular: tachycardia, RR, No MRG Respiratory: CTA B Musculoskeletal: no deformities Skin:no rashes Neurological: + tremor with outstretched hands  ASSESSMENT: 1. DM1, insulin-dependent, uncontrolled, without long-term complications, but with DKA episodes  Component     Latest Ref Rng & Units 12/12/2018  Glucose, Plasma     65 - 99 mg/dL 12/14/2018 (H)  C-Peptide     0.80 - 3.85 ng/mL 0.52 (L)  Islet Cell Ab     Neg:<1:1 Negative  ZNT8 Antibodies     U/mL <15   2.  Dyslipidemia  PLAN:  1. Patient  with longstanding, very uncontrolled type 1 diabetes, with history of DKA, in the setting of not affording insulin in the past, but more recently due to either putting the Lantus into the pump instead of rapid acting insulin or due to nausea and vomiting.  At our last several visits, his sugars were extremely high and we discussed at last visit that the only solution that we have was to switch to an insulin pump.  He was able to start the OmniPod 5 pump. -He had another HbA1c obtained last month which was lower, at 10.6%, which is a significant improvement from 4 months prior, when it was >15.5% CGM  interpretation: -At today's visit, we reviewed his CGM downloads: It appears that 41 of values are in target range (goal >70%), while 59% are higher than 180 (goal <25%), and 0% are lower than 70 (goal <4%).  The calculated average blood sugar is 207.  The projected HbA1c for the next 3 months (GMI) is approximately 9%. -Reviewing the CGM trends, sugars are almost dramatically improved from before, now on the insulin pump.  They are now at or slightly above the upper limit of the target range, with more pronounced spikes during the night and then after approximately 2 PM.  We will increase his basal rates, but we discussed about the need to bolus for every meal.  As of now, he is not doing any boluses.  He describes that he is occasionally kicked out of the automatic mode due to the high blood sugars and I explained that this is most likely due to not bolusing for meals.  He has a 1: 1 insulin to carb ratio as he is not completely comfortable with carb counting.  I advised him to start by entering 6 g of carbs and may increase to 12 depending on the size and consistency of his meals.  Due to his suspected gastroparesis, I advised him to hold his Humalog at the start of the meal, rather than 15 minutes before. -Wise, since the start of his blood sugars and I have seen for him, I did not recommend further changes. - I suggested to:  Patient Instructions  Please use the following pump settings: - Basal rates: 12 am: 1.25  >> 1.35 units/h - Insulin to carb ratio: 12 am: 1 - Target: 12 am: 140 - Correction factor (insulin sensitivity factor):  12 am: 28 - Active insulin time: 4h  Enter between 6-12 g carbs in the pump and start the boluses right before meals.  Please return in 2  months.    - advised to check sugars at different times of the day - 4x a day, rotating check times - advised for yearly eye exams >> he is not UTD - return to clinic in 2 months  2.  Dyslipidemia -Reviewed latest lipid  panel from 07/2022: Fractions at goal with exception of a low HDL: Lab Results  Component Value Date   CHOL 85 (L) 07/28/2022   HDL 39 (L) 07/28/2022   LDLCALC 37 07/28/2022   TRIG 26 07/28/2022   CHOLHDL 2.2 07/28/2022  -He is not on a statin  Philemon Kingdom, MD PhD [facility] Endocrinology

## 2022-11-03 NOTE — Telephone Encounter (Signed)
Transition Care Management Unsuccessful Follow-up Telephone Call  Date of discharge and from where:  [facility] 1.13.24  Attempts:  3rd Attempt  Reason for unsuccessful TCM follow-up call:  Left voice message

## 2022-11-28 ENCOUNTER — Encounter: Payer: Self-pay | Admitting: Internal Medicine

## 2022-11-30 ENCOUNTER — Encounter: Payer: Self-pay | Admitting: Family Medicine

## 2022-12-15 ENCOUNTER — Ambulatory Visit: Payer: Managed Care, Other (non HMO) | Admitting: Family Medicine

## 2022-12-15 ENCOUNTER — Telehealth: Payer: Self-pay | Admitting: Family Medicine

## 2022-12-15 NOTE — Telephone Encounter (Signed)
Pt was a no show for an OV with Dr Ethelene Hal on 12/15/22, I sent a letter.

## 2022-12-26 NOTE — Telephone Encounter (Signed)
1st no show, fee waived, letter sent to reschedule/notify of policy 

## 2022-12-29 ENCOUNTER — Ambulatory Visit: Payer: Managed Care, Other (non HMO) | Admitting: Family Medicine

## 2022-12-29 ENCOUNTER — Other Ambulatory Visit: Payer: Self-pay

## 2022-12-29 ENCOUNTER — Emergency Department (HOSPITAL_BASED_OUTPATIENT_CLINIC_OR_DEPARTMENT_OTHER)
Admission: EM | Admit: 2022-12-29 | Discharge: 2022-12-29 | Disposition: A | Discharge status: Home / Self Care | Payer: Managed Care, Other (non HMO) | Attending: Emergency Medicine | Admitting: Emergency Medicine

## 2022-12-29 ENCOUNTER — Encounter: Payer: Self-pay | Admitting: Family Medicine

## 2022-12-29 ENCOUNTER — Emergency Department (HOSPITAL_BASED_OUTPATIENT_CLINIC_OR_DEPARTMENT_OTHER): Payer: Managed Care, Other (non HMO)

## 2022-12-29 VITALS — BP 116/72 | HR 113 | Temp 98.1°F | Ht 75.0 in | Wt 132.0 lb

## 2022-12-29 DIAGNOSIS — R079 Chest pain, unspecified: Secondary | ICD-10-CM

## 2022-12-29 DIAGNOSIS — R5381 Other malaise: Secondary | ICD-10-CM | POA: Insufficient documentation

## 2022-12-29 DIAGNOSIS — E860 Dehydration: Secondary | ICD-10-CM | POA: Diagnosis not present

## 2022-12-29 DIAGNOSIS — R112 Nausea with vomiting, unspecified: Secondary | ICD-10-CM | POA: Insufficient documentation

## 2022-12-29 DIAGNOSIS — R000 Tachycardia, unspecified: Secondary | ICD-10-CM | POA: Diagnosis not present

## 2022-12-29 DIAGNOSIS — E139 Other specified diabetes mellitus without complications: Secondary | ICD-10-CM

## 2022-12-29 DIAGNOSIS — R634 Abnormal weight loss: Secondary | ICD-10-CM

## 2022-12-29 DIAGNOSIS — K5901 Slow transit constipation: Secondary | ICD-10-CM | POA: Insufficient documentation

## 2022-12-29 DIAGNOSIS — R9431 Abnormal electrocardiogram [ECG] [EKG]: Secondary | ICD-10-CM

## 2022-12-29 LAB — CBC
HCT: 42.1 % (ref 39.0–52.0)
Hemoglobin: 14.4 g/dL (ref 13.0–17.0)
MCH: 28 pg (ref 26.0–34.0)
MCHC: 34.2 g/dL (ref 30.0–36.0)
MCV: 81.7 fL (ref 80.0–100.0)
Platelets: 325 10*3/uL (ref 150–400)
RBC: 5.15 MIL/uL (ref 4.22–5.81)
RDW: 12.1 % (ref 11.5–15.5)
WBC: 7.3 10*3/uL (ref 4.0–10.5)
nRBC: 0 % (ref 0.0–0.2)

## 2022-12-29 LAB — BASIC METABOLIC PANEL
Anion gap: 12 (ref 5–15)
BUN: 16 mg/dL (ref 6–20)
CO2: 26 mmol/L (ref 22–32)
Calcium: 8.6 mg/dL — ABNORMAL LOW (ref 8.9–10.3)
Chloride: 88 mmol/L — ABNORMAL LOW (ref 98–111)
Creatinine, Ser: 0.89 mg/dL (ref 0.61–1.24)
GFR, Estimated: >60 mL/min (ref >60)
Glucose, Bld: 180 mg/dL — ABNORMAL HIGH (ref 70–99)
Potassium: 2.6 mmol/L — CL (ref 3.5–5.1)
Sodium: 126 mmol/L — ABNORMAL LOW (ref 135–145)

## 2022-12-29 LAB — CBG MONITORING, ED: Glucose-Capillary: 182 mg/dL — ABNORMAL HIGH (ref 70–99)

## 2022-12-29 LAB — TROPONIN I (HIGH SENSITIVITY): Troponin I (High Sensitivity): 5 ng/L (ref <18)

## 2022-12-29 MED ORDER — ONDANSETRON HCL 4 MG/2ML IJ SOLN
4.0000 mg | Freq: Once | INTRAMUSCULAR | Status: AC
  Administered 2022-12-29: 4 mg via INTRAVENOUS
  Filled 2022-12-29: qty 2

## 2022-12-29 MED ORDER — PROMETHAZINE HCL 25 MG RE SUPP: 25.0000 mg | Freq: Four times a day (QID) | RECTAL | 0 refills | Status: DC | PRN

## 2022-12-29 MED ORDER — ONDANSETRON HCL 4 MG PO TABS: 4.0000 mg | ORAL_TABLET | Freq: Four times a day (QID) | ORAL | 0 refills | Status: AC | PRN

## 2022-12-29 MED ORDER — LACTATED RINGERS IV BOLUS
1000.0000 mL | Freq: Once | INTRAVENOUS | Status: AC
  Administered 2022-12-29: 1000 mL via INTRAVENOUS

## 2022-12-29 MED ORDER — POTASSIUM CHLORIDE 20 MEQ PO PACK
60.0000 meq | PACK | Freq: Once | ORAL | Status: AC
  Administered 2022-12-29: 60 meq via ORAL
  Filled 2022-12-29: qty 3

## 2022-12-29 MED ORDER — HALOPERIDOL LACTATE 5 MG/ML IJ SOLN: 2.5000 mg | Freq: Once | INTRAMUSCULAR | Status: DC

## 2022-12-29 MED ORDER — POTASSIUM CHLORIDE 10 MEQ/100ML IV SOLN
10.0000 meq | INTRAVENOUS | Status: AC
  Administered 2022-12-29 (×2): 10 meq via INTRAVENOUS
  Filled 2022-12-29 (×2): qty 100

## 2022-12-29 MED ORDER — MAGNESIUM SULFATE 2 GM/50ML IV SOLN
2.0000 g | Freq: Once | INTRAVENOUS | Status: AC
  Administered 2022-12-29: 2 g via INTRAVENOUS
  Filled 2022-12-29: qty 50

## 2022-12-29 MED ORDER — METOCLOPRAMIDE HCL 5 MG/ML IJ SOLN
5.0000 mg | Freq: Once | INTRAMUSCULAR | Status: AC
  Administered 2022-12-29: 5 mg via INTRAVENOUS
  Filled 2022-12-29: qty 2

## 2022-12-29 MED ORDER — POTASSIUM CHLORIDE CRYS ER 20 MEQ PO TBCR: 40.0000 meq | EXTENDED_RELEASE_TABLET | Freq: Two times a day (BID) | ORAL | 0 refills | Status: DC

## 2022-12-29 NOTE — Discharge Instructions (Addendum)
You were seen today for nausea and vomiting for the last 6 months. I am worried you might be developing gastroparesis or another stomach dysfunction though this does require evaluation by a gastroenterologist.  I placed a referral to gastroenterology. Concerning your T wave inversions on your EKG, those have been seen in your EKG since 2018.  Your troponin studies were negative today.  Please follow up with cardiology given your chest pain for further outpatient care and management.

## 2022-12-29 NOTE — Progress Notes (Signed)
Established Patient Office Visit   Subjective:  Patient ID: Edward Walls, male    DOB: 1981-01-24  Age: 42 y.o. MRN: [id number]  Chief Complaint  Patient presents with   Emesis    Nauseous vomiting, no appetite x 1-2 months. Abdominal pains, heart racing sometimes.     Emesis  Pertinent negatives include no abdominal pain or myalgias.   Encounter Diagnoses  Name Primary?   Abnormal loss of weight Yes   Tachycardia    Nausea and vomiting, unspecified vomiting type    Dehydration    Diabetes mellitus type 1.5, managed as type 1 (HCC)    Abnormal EKG    1 to 10-month history of weight loss with elevated heart rate and intermittent nausea and vomiting.  History of poorly controlled diabetes.  He reports compliance with his insulins.  He has worked on improving his fluid intake with water.  Urination has been infrequent.  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  Upcoming appointments with GI and endocrinology.  He denies chest pain but has been experiencing shortness of breath with DOE.   Review of Systems  Constitutional: Negative.   HENT: Negative.    Eyes:  Negative for blurred vision, discharge and redness.  Respiratory: Negative.    Cardiovascular:  Positive for palpitations.  Gastrointestinal:  Positive for constipation, nausea and vomiting. Negative for abdominal pain.  Genitourinary: Negative.  Negative for frequency.  Musculoskeletal: Negative.  Negative for myalgias.  Skin:  Negative for rash.  Neurological:  Negative for tingling, loss of consciousness and weakness.  Endo/Heme/Allergies:  Negative for polydipsia.  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  Thanks TAVR right here   Current Outpatient Medications:    Continuous Blood Gluc Sensor (DEXCOM G6 SENSOR) MISC, Replace every 10 days, Disp: 9 each, Rfl: 3   Continuous Blood Gluc Sensor (FREESTYLE LIBRE 2 SENSOR) MISC, CHANGE SENSOR EVERY 14 DAYS, Disp: 6 each, Rfl: 2   Continuous Blood Gluc Transmit (DEXCOM G6  TRANSMITTER) MISC, 1 Device by Does not apply route every 3 (three) months., Disp: 1 each, Rfl: 3   Continuous Blood Gluc Transmit (DEXCOM G6 TRANSMITTER) MISC, 1 Device by Does not apply route every 3 (three) months., Disp: 1 each, Rfl: 3   feeding supplement, GLUCERNA SHAKE, (GLUCERNA SHAKE) LIQD, Take 237 mLs by mouth 3 (three) times daily between meals., Disp: 21330 mL, Rfl: 2   gabapentin (NEURONTIN) 300 MG capsule, Take 2 capsules (600 mg total) by mouth 3 (three) times daily., Disp: 180 capsule, Rfl: 11   Insulin Disposable Pump (OMNIPOD 5 G6 INTRO, GEN 5,) KIT, 1 each by Does not apply route as needed., Disp: 1 kit, Rfl: 0   Insulin Disposable Pump (OMNIPOD 5 G6 POD, GEN 5,) MISC, 1 each by Does not apply route every 3 (three) days., Disp: 30 each, Rfl: 3   insulin lispro (HUMALOG) 100 UNIT/ML injection, Inject 20 Units into the skin 3 (three) times daily with meals. Using in PUMP, Disp: , Rfl:    Insulin Lispro-aabc (LYUMJEV) 100 UNIT/ML SOLN, Inject 80 Units into the skin daily. Via pump., Disp: 10 mL, Rfl: 0   Insulin Pen Needle 32G X 4 MM MISC, Use 4-5x a day, Disp: 300 each, Rfl: 3   metoCLOPramide (REGLAN) 10 MG tablet, Take 1 tablet (10 mg total) by mouth 3 (three) times daily as needed for nausea., Disp: 60 tablet, Rfl: 3   ondansetron (ZOFRAN) 4 MG tablet, Take 1 tablet (4 mg total) by mouth every  8 (eight) hours as needed for nausea or vomiting., Disp: 20 tablet, Rfl: 0   pantoprazole (PROTONIX) 40 MG tablet, Take 1 tablet (40 mg total) by mouth daily., Disp: 30 tablet, Rfl: 11   polyethylene glycol (MIRALAX / GLYCOLAX) 17 g packet, Take 17 g by mouth daily as needed for moderate constipation., Disp: 14 each, Rfl: 0   promethazine (PHENERGAN) 25 MG tablet, Take 1 tablet (25 mg total) by mouth every 6 (six) hours as needed for nausea or vomiting., Disp: 30 tablet, Rfl: 0   Objective:     BP 116/72 (BP Location: Right Arm, Patient Position: Sitting, Cuff Size: Normal)   Pulse (!)  113   Temp 98.1 F (36.7 C) (Temporal)   Ht 6\' 3"  (1.905 m)   Wt 132 lb (59.9 kg)   SpO2 98%   BMI 16.50 kg/m  Wt Readings from Last 3 Encounters:  12/29/22 132 lb (59.9 kg)  11/02/22 149 lb (67.6 kg)  10/27/22 140 lb (63.5 kg)      Physical Exam Constitutional:      General: He is not in acute distress.    Appearance: Normal appearance. He is not ill-appearing, toxic-appearing or diaphoretic.  HENT:     Head: Normocephalic and atraumatic.     Right Ear: External ear normal.     Left Ear: External ear normal.     Mouth/Throat:     Mouth: Mucous membranes are moist.     Pharynx: Oropharynx is clear. No oropharyngeal exudate or posterior oropharyngeal erythema.  Eyes:     General: No scleral icterus.       Right eye: No discharge.        Left eye: No discharge.     Extraocular Movements: Extraocular movements intact.     Conjunctiva/sclera: Conjunctivae normal.     Pupils: Pupils are equal, round, and reactive to light.  Cardiovascular:     Rate and Rhythm: Normal rate and regular rhythm.  Pulmonary:     Effort: Pulmonary effort is normal. No respiratory distress.     Breath sounds: Normal breath sounds.  Abdominal:     General: Bowel sounds are normal.     Tenderness: There is no abdominal tenderness. There is no guarding.  Musculoskeletal:     Cervical back: No rigidity or tenderness.  Skin:    General: Skin is warm and dry.       Neurological:     Mental Status: He is alert and oriented to person, place, and time.  Psychiatric:        Mood and Affect: Mood normal.        Behavior: Behavior normal.      No results found for any visits on 12/29/22.    The ASCVD Risk score (Arnett DK, et al., 2019) failed to calculate for the following reasons:   The valid total cholesterol range is 130 to 320 mg/dL    Assessment & Plan:   Abnormal loss of weight -     TSH+T4F+T3Free+ThyAbs+TPO+VD25  Tachycardia -     TSH+T4F+T3Free+ThyAbs+TPO+VD25 -     CBC -      EKG 12-Lead  Nausea and vomiting, unspecified vomiting type  Dehydration -     Basic metabolic panel -     Urinalysis, Routine w reflex microscopic  Diabetes mellitus type 1.5, managed as type 1 (HCC) -     Basic metabolic panel -     Hemoglobin A1c  Abnormal EKG    Return in about 1  week (around 01/05/2023), or Go directly to the emergency room now..  EKG shows an elevated heart rate of 95 with flipped T waves in the precordial leads.  Question of ST segment elevation V2 V3.  Cannot rule out ACS.  Patient is comfortable and stable.  His mother will drive him directly to the emergency room.    [name], MD

## 2022-12-29 NOTE — ED Provider Notes (Signed)
[facility] Provider Note   CSN: [id number] Arrival date & time: 12/29/22  1652     History Chief Complaint  Patient presents with   Chest Pain    HPI Edward Walls is a 42 y.o. male presenting for multiple complaints. He has had approximately 1 year of weight loss abdominal pain, frequent nausea vomiting, feels dehydrated and having malaise. Required admission in January for further evaluation of this grossly benign was recommended for outpatient gastroenterology follow-up but was unable to obtain this follow-up.  Had his follow-up with his PCP today who sent him to the emergency department for T wave inversions on her EKG.  He denies any worsening chest pain.  States has had daily chest pain over the past year worse with his nausea vomiting and indigestion.  Has been on daily Protonix since he was discharged.  Otherwise ambulatory in no acute distress tolerating p.o. fluids..   Patient's recorded medical, surgical, social, medication list and allergies were reviewed in the Snapshot window as part of the initial history.   Review of Systems   Review of Systems  Constitutional:  Negative for chills and fever.  HENT:  Negative for ear pain and sore throat.   Eyes:  Negative for pain and visual disturbance.  Respiratory:  Negative for cough and shortness of breath.   Cardiovascular:  Negative for chest pain and palpitations.  Gastrointestinal:  Positive for abdominal pain, nausea and vomiting.  Genitourinary:  Negative for dysuria and hematuria.  Musculoskeletal:  Negative for arthralgias and back pain.  Skin:  Negative for color change and rash.  Neurological:  Negative for seizures and syncope.  All other systems reviewed and are negative.   Physical Exam Updated Vital Signs BP (!) 129/96 (BP Location: Left Arm)   Pulse (!) 108   Temp 98.8 F (37.1 C) (Oral)   Resp 14   Wt 59 kg   SpO2 100%   BMI 16.25 kg/m  Physical  Exam Vitals and nursing note reviewed.  Constitutional:      General: He is not in acute distress.    Appearance: He is well-developed.  HENT:     Head: Normocephalic and atraumatic.  Eyes:     Conjunctiva/sclera: Conjunctivae normal.  Cardiovascular:     Rate and Rhythm: Normal rate and regular rhythm.     Heart sounds: No murmur heard. Pulmonary:     Effort: Pulmonary effort is normal. No respiratory distress.     Breath sounds: Normal breath sounds.  Abdominal:     Palpations: Abdomen is soft.     Tenderness: There is no abdominal tenderness.  Musculoskeletal:        General: No swelling.     Cervical back: Neck supple.  Skin:    General: Skin is warm and dry.     Capillary Refill: Capillary refill takes less than 2 seconds.  Neurological:     Mental Status: He is alert.  Psychiatric:        Mood and Affect: Mood normal.      ED Course/ Medical Decision Making/ A&P    Procedures Procedures   Medications Ordered in ED Medications  lactated ringers bolus 1,000 mL (has no administration in time range)    Medical Decision Making:    Edward Walls is a 42 y.o. male who presented to the ED today with multiple concerns detailed above detailed above.     Patient's presentation is complicated by their history of  type 1 diabetes.  Patient placed on continuous vitals and telemetry monitoring while in ED which was reviewed periodically.   Complete initial physical exam performed, notably the patient  was hemodynamically stable in no acute distress.      Reviewed and confirmed nursing documentation for past medical history, family history, social history.    Initial Assessment:   Patient's history of present onset physical exam findings are not consistent with any acute pathology.  His EKG looks the same as prior EKGs since 2018.  No new ST elevation no new T wave inversions.  Detected T wave inversion per PCP has been present for years. He is not having active chest  pain during my conversation with the patient.  Will evaluate broadly with serial troponins, CBC, CMP and recommend close follow-up with primary care provider in the outpatient setting.  Outpatient labs are ordered per PCP. Concerning his chronic nausea vomiting, this is likely developing gastroparesis in the setting of longstanding diabetes with intermittently poor control.  Cyclic vomiting is also considered.  However he is tolerating fluids and his symptoms have been present over an extended amount of time.  Patient does warrant immediate outpatient follow-up for evaluation for malignancy given his weight loss Will refer to gastroenterology.  Concerning his longstanding T wave inversions, patient be referred to cardiology in the outpatient setting given negative serial troponins today and lack of acute chest pain.  It is likely more gastroenterologic in nature but given patient's developing failure to thrive in the outpatient setting, he warrants multiple subspecialty evaluations for potential alternative etiologies. Pulmonary embolism is considered less likely based on intermittent nature of symptoms, lack of acute symptoms today and low Wells score.  Negative troponin today.  Patient tolerating potassium supplementation p.o.  Given IV KCL as well.  Patient's symptoms were stabilized with medications administered.  Disposition:  I have considered need for hospitalization, however, considering all of the above, I believe this patient is stable for discharge at this time.  Patient/family educated about specific return precautions for given chief complaint and symptoms.  Patient/family educated about follow-up with PCP.     Patient/family expressed understanding of return precautions and need for follow-up. Patient spoken to regarding all imaging and laboratory results and appropriate follow up for these results. All education provided in verbal form with additional information in written form. Time was  allowed for answering of patient questions. Patient discharged.    Emergency Department Medication Summary:   Medications  lactated ringers bolus 1,000 mL (has no administration in time range)             clinical Impression:  1. Nonspecific chest pain      Data Unavailable   Final Clinical Impression(s) / ED Diagnoses Final diagnoses:  Nonspecific chest pain    Rx / DC Orders ED Discharge Orders     None         Tretha Sciara, MD 12/29/22 2259

## 2022-12-29 NOTE — ED Triage Notes (Signed)
Pt arrives pov, c/o mid-sternal and LT side CP  with n/v"for several months". Mother in triage providing info. Pts mother states pt is not eating well d/t emesis after eating. Pt referred by PCP, stated pt was told by pcp that "heart stressed"

## 2023-01-01 ENCOUNTER — Telehealth: Payer: Self-pay
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
## 2023-01-03 ENCOUNTER — Encounter: Payer: Self-pay | Admitting: Family Medicine

## 2023-01-03 ENCOUNTER — Ambulatory Visit: Payer: Managed Care, Other (non HMO) | Admitting: Family Medicine

## 2023-01-03 VITALS — BP 120/72 | HR 105 | Temp 98.4°F | Ht 75.0 in | Wt 144.8 lb

## 2023-01-03 DIAGNOSIS — T2105XA Burn of unspecified degree of buttock, initial encounter: Secondary | ICD-10-CM | POA: Diagnosis not present

## 2023-01-03 DIAGNOSIS — L0291 Cutaneous abscess, unspecified: Secondary | ICD-10-CM | POA: Diagnosis not present

## 2023-01-03 MED ORDER — AMOXICILLIN-POT CLAVULANATE 875-125 MG PO TABS: 1.0000 | ORAL_TABLET | Freq: Two times a day (BID) | ORAL | 0 refills | Status: DC
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
## 2023-01-04 ENCOUNTER — Encounter: Payer: Self-pay | Admitting: Family Medicine

## 2023-01-05 ENCOUNTER — Telehealth: Payer: Self-pay | Admitting: Family Medicine

## 2023-01-05 NOTE — Telephone Encounter (Signed)
[facility]/ [facility] [phone]  They stated that this pt needs to be referred to wound care. They will not be able to help him.

## 2023-01-05 NOTE — Telephone Encounter (Signed)
Please advise message below  °

## 2023-01-08 ENCOUNTER — Other Ambulatory Visit: Payer: Self-pay

## 2023-01-08 ENCOUNTER — Telehealth: Payer: Self-pay

## 2023-01-08 DIAGNOSIS — T2105XA Burn of unspecified degree of buttock, initial encounter: Secondary | ICD-10-CM

## 2023-01-08 DIAGNOSIS — L0231 Cutaneous abscess of buttock: Secondary | ICD-10-CM

## 2023-01-08 DIAGNOSIS — L0291 Cutaneous abscess, unspecified: Secondary | ICD-10-CM

## 2023-01-08 NOTE — Telephone Encounter (Signed)
Referral placed patient aware someone will call to schedule an appointment.

## 2023-01-08 NOTE — Telephone Encounter (Signed)
Spoke with patient informed that new referral was place to wound clinic and someone would be giving patient a call to schedule an appointment.

## 2023-01-08 NOTE — Telephone Encounter (Signed)
Edward Walls called to answer your question. He has not heard from the wound clinic as of yet.
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
## 2023-01-10 NOTE — Telephone Encounter (Signed)
Patient has appointment scheduled with wound clinic and awaiting a call to be scheduled with general surgery to have abscess drained

## 2023-01-10 NOTE — Telephone Encounter (Signed)
Spoke with patient who verbalizes understanding that someone will give him a call tomorrow to schedule and appointment at [facility] surgery.

## 2023-01-22 ENCOUNTER — Telehealth: Payer: Self-pay | Admitting: Family Medicine

## 2023-01-22 NOTE — Telephone Encounter (Signed)
Caller Name: Cori Razor mom Call back phone #: [phone]  REFERRAL REQUEST Has patient seen PCP for this complaint? No.Specifically nausea and vomitting - - IF NO, is insurance requiring patient see PCP for this issue before PCP can refer them? Yes.   Referral for which specialty: Gastrointology Preferred provider/office: [facility] Reason for referral: stomach issues

## 2023-01-22 NOTE — Telephone Encounter (Signed)
Patient requesting referral to GI for continued stomach issues with vomiting and diarrhea. Okay for referral?

## 2023-01-22 NOTE — Telephone Encounter (Signed)
Wanting this referral to [facility] in [place], [place]

## 2023-01-23 ENCOUNTER — Other Ambulatory Visit: Payer: Self-pay

## 2023-01-23 ENCOUNTER — Encounter (HOSPITAL_BASED_OUTPATIENT_CLINIC_OR_DEPARTMENT_OTHER): Payer: Managed Care, Other (non HMO) | Attending: Internal Medicine | Admitting: Internal Medicine

## 2023-01-23 DIAGNOSIS — R112 Nausea with vomiting, unspecified: Secondary | ICD-10-CM

## 2023-01-23 DIAGNOSIS — R634 Abnormal weight loss: Secondary | ICD-10-CM

## 2023-01-23 DIAGNOSIS — E1043 Type 1 diabetes mellitus with diabetic autonomic (poly)neuropathy: Secondary | ICD-10-CM

## 2023-01-23 NOTE — Telephone Encounter (Signed)
Mom called to f/u on referral to [facility]. Advised it is still pending.

## 2023-01-23 NOTE — Telephone Encounter (Signed)
Please advise patient and patients mom calling requesting referral to GI for continued Stomach issues. They would like to be referred to [facility]

## 2023-01-24 NOTE — Telephone Encounter (Signed)
Referral placed patient aware someone will call to schedule an appointment. LM detailed message on VM asked patient to call back with any questions.

## 2023-01-26 ENCOUNTER — Other Ambulatory Visit: Payer: Self-pay

## 2023-01-26 ENCOUNTER — Telehealth: Payer: Self-pay | Admitting: Family Medicine

## 2023-01-26 ENCOUNTER — Observation Stay (HOSPITAL_COMMUNITY)
Admission: EM | Admit: 2023-01-26 | Discharge: 2023-01-29 | Disposition: A | Discharge status: Home / Self Care | Payer: Managed Care, Other (non HMO) | Attending: Internal Medicine | Admitting: Internal Medicine

## 2023-01-26 ENCOUNTER — Encounter (HOSPITAL_COMMUNITY): Payer: Self-pay

## 2023-01-26 DIAGNOSIS — Z79899 Other long term (current) drug therapy: Secondary | ICD-10-CM | POA: Diagnosis not present

## 2023-01-26 DIAGNOSIS — Z87891 Personal history of nicotine dependence: Secondary | ICD-10-CM | POA: Diagnosis not present

## 2023-01-26 DIAGNOSIS — E43 Unspecified severe protein-calorie malnutrition: Secondary | ICD-10-CM | POA: Insufficient documentation

## 2023-01-26 DIAGNOSIS — E1010 Type 1 diabetes mellitus with ketoacidosis without coma: Secondary | ICD-10-CM | POA: Diagnosis present

## 2023-01-26 DIAGNOSIS — Z91010 Allergy to peanuts: Secondary | ICD-10-CM | POA: Diagnosis not present

## 2023-01-26 DIAGNOSIS — E1043 Type 1 diabetes mellitus with diabetic autonomic (poly)neuropathy: Secondary | ICD-10-CM | POA: Diagnosis not present

## 2023-01-26 DIAGNOSIS — E1110 Type 2 diabetes mellitus with ketoacidosis without coma: Secondary | ICD-10-CM | POA: Diagnosis present

## 2023-01-26 DIAGNOSIS — Z794 Long term (current) use of insulin: Secondary | ICD-10-CM | POA: Insufficient documentation

## 2023-01-26 DIAGNOSIS — M5459 Other low back pain: Secondary | ICD-10-CM | POA: Insufficient documentation

## 2023-01-26 DIAGNOSIS — E871 Hypo-osmolality and hyponatremia: Secondary | ICD-10-CM | POA: Insufficient documentation

## 2023-01-26 DIAGNOSIS — R112 Nausea with vomiting, unspecified: Secondary | ICD-10-CM | POA: Diagnosis present

## 2023-01-26 DIAGNOSIS — G8929 Other chronic pain: Secondary | ICD-10-CM | POA: Insufficient documentation

## 2023-01-26 DIAGNOSIS — E785 Hyperlipidemia, unspecified: Secondary | ICD-10-CM | POA: Diagnosis present

## 2023-01-26 DIAGNOSIS — E8729 Other acidosis: Secondary | ICD-10-CM | POA: Diagnosis present

## 2023-01-26 DIAGNOSIS — E139 Other specified diabetes mellitus without complications: Secondary | ICD-10-CM | POA: Diagnosis present

## 2023-01-26 LAB — CBC
HCT: 46.5 % (ref 39.0–52.0)
Hemoglobin: 14.9 g/dL (ref 13.0–17.0)
MCH: 28 pg (ref 26.0–34.0)
MCHC: 32 g/dL (ref 30.0–36.0)
MCV: 87.4 fL (ref 80.0–100.0)
Platelets: 386 10*3/uL (ref 150–400)
RBC: 5.32 MIL/uL (ref 4.22–5.81)
RDW: 12.7 % (ref 11.5–15.5)
WBC: 6.4 10*3/uL (ref 4.0–10.5)
nRBC: 0 % (ref 0.0–0.2)

## 2023-01-26 LAB — BASIC METABOLIC PANEL
Anion gap: 23 — ABNORMAL HIGH (ref 5–15)
BUN: 25 mg/dL — ABNORMAL HIGH (ref 6–20)
CO2: 17 mmol/L — ABNORMAL LOW (ref 22–32)
Calcium: 8.9 mg/dL (ref 8.9–10.3)
Chloride: 91 mmol/L — ABNORMAL LOW (ref 98–111)
Creatinine, Ser: 1.08 mg/dL (ref 0.61–1.24)
GFR, Estimated: >60 mL/min (ref >60)
Glucose, Bld: 391 mg/dL — ABNORMAL HIGH (ref 70–99)
Potassium: 4.4 mmol/L (ref 3.5–5.1)
Sodium: 131 mmol/L — ABNORMAL LOW (ref 135–145)

## 2023-01-26 LAB — HEPATIC FUNCTION PANEL
ALT: 21 U/L (ref 0–44)
AST: 19 U/L (ref 15–41)
Albumin: 4.1 g/dL (ref 3.5–5.0)
Alkaline Phosphatase: 60 U/L (ref 38–126)
Bilirubin, Direct: 0.4 mg/dL — ABNORMAL HIGH (ref 0.0–0.2)
Indirect Bilirubin: 3.7 mg/dL — ABNORMAL HIGH (ref 0.3–0.9)
Total Bilirubin: 4.1 mg/dL — ABNORMAL HIGH (ref 0.3–1.2)
Total Protein: 7.6 g/dL (ref 6.5–8.1)

## 2023-01-26 LAB — BLOOD GAS, VENOUS
Acid-base deficit: 10.2 mmol/L — ABNORMAL HIGH (ref 0.0–2.0)
Bicarbonate: 14.4 mmol/L — ABNORMAL LOW (ref 20.0–28.0)
O2 Saturation: 87.7 %
Patient temperature: 37
pCO2, Ven: 28 mmHg — ABNORMAL LOW (ref 44–60)
pH, Ven: 7.32 (ref 7.25–7.43)
pO2, Ven: 56 mmHg — ABNORMAL HIGH (ref 32–45)

## 2023-01-26 LAB — CBG MONITORING, ED
Glucose-Capillary: 391 mg/dL — ABNORMAL HIGH (ref 70–99)
Glucose-Capillary: 409 mg/dL — ABNORMAL HIGH (ref 70–99)

## 2023-01-26 LAB — LIPASE, BLOOD: Lipase: 28 U/L (ref 11–51)

## 2023-01-26 MED ORDER — FENTANYL CITRATE PF 50 MCG/ML IJ SOSY
50.0000 ug | PREFILLED_SYRINGE | Freq: Once | INTRAMUSCULAR | Status: AC
  Administered 2023-01-26: 50 ug via INTRAVENOUS
  Filled 2023-01-26: qty 1

## 2023-01-26 MED ORDER — INSULIN REGULAR(HUMAN) IN NACL 100-0.9 UT/100ML-% IV SOLN
INTRAVENOUS | Status: DC
  Administered 2023-01-26: 8.5 [IU]/h via INTRAVENOUS
  Filled 2023-01-26: qty 100

## 2023-01-26 MED ORDER — LACTATED RINGERS IV SOLN: INTRAVENOUS | Status: DC

## 2023-01-26 MED ORDER — DEXTROSE 50 % IV SOLN: 0.0000 mL | INTRAVENOUS | Status: DC | PRN

## 2023-01-26 MED ORDER — METOCLOPRAMIDE HCL 5 MG/ML IJ SOLN
10.0000 mg | Freq: Once | INTRAMUSCULAR | Status: AC
  Administered 2023-01-26: 10 mg via INTRAVENOUS
  Filled 2023-01-26: qty 2

## 2023-01-26 MED ORDER — DEXTROSE IN LACTATED RINGERS 5 % IV SOLN: INTRAVENOUS | Status: DC

## 2023-01-26 MED ORDER — LACTATED RINGERS IV BOLUS
20.0000 mL/kg | Freq: Once | INTRAVENOUS | Status: AC
  Administered 2023-01-26: 1180 mL via INTRAVENOUS

## 2023-01-26 MED ORDER — POTASSIUM CHLORIDE 10 MEQ/100ML IV SOLN
10.0000 meq | INTRAVENOUS | Status: AC
  Administered 2023-01-26 – 2023-01-27 (×2): 10 meq via INTRAVENOUS
  Filled 2023-01-26 (×2): qty 100

## 2023-01-26 NOTE — Telephone Encounter (Signed)
I have printed and faxed manually to [facility]

## 2023-01-26 NOTE — ED Triage Notes (Signed)
Patient BIB EMS. Patient reports high blood sugars starting 2 days ago. Patient's dexcom read high this evening. Last dose of insulin was this AM. Patient reports nausea and vomiting "for a while," patient seeing GI for gastroparesis currently. Patients BGL is 409 on arrival. Patient received 4 mg of zofran with ems.

## 2023-01-26 NOTE — ED Provider Notes (Signed)
[place] EMERGENCY DEPARTMENT AT [facility] Provider Note   CSN: [id number] Arrival date & time: 01/26/23  2126     History {Add pertinent medical, surgical, social history, OB history to HPI:1} Chief Complaint  Patient presents with   Hyperglycemia    Edward Walls is a 42 y.o. male.  The history is provided by the patient and medical records.  Hyperglycemia Edward Walls is a 42 y.o. male who presents to the Emergency Department complaining of *** Vomiting for months due to gastroparesis Felt hot a few days ago  No diarrhea/constipation, dysuria.  No sores or wounds.  Stopped using pump one month ago due to feeling bad.  Still taking insulin.  Sugar high for two days      Home Medications Prior to Admission medications   Medication Sig Start Date End Date Taking? Authorizing Provider  amoxicillin-clavulanate (AUGMENTIN) 875-125 MG tablet Take 1 tablet by mouth 2 (two) times daily. 01/03/23   Mliss Sax, MD  Continuous Blood Gluc Sensor (DEXCOM G6 SENSOR) MISC Replace every 10 days 08/11/22   Carlus Pavlov, MD  Continuous Blood Gluc Sensor (FREESTYLE LIBRE 2 SENSOR) MISC CHANGE SENSOR EVERY 14 DAYS 08/04/22   Mliss Sax, MD  Continuous Blood Gluc Transmit (DEXCOM G6 TRANSMITTER) MISC 1 Device by Does not apply route every 3 (three) months. 02/20/22   Carlus Pavlov, MD  Continuous Blood Gluc Transmit (DEXCOM G6 TRANSMITTER) MISC 1 Device by Does not apply route every 3 (three) months. 02/20/22   Carlus Pavlov, MD  feeding supplement, GLUCERNA SHAKE, (GLUCERNA SHAKE) LIQD Take 237 mLs by mouth 3 (three) times daily between meals. 10/28/22 01/26/23  Kathlen Mody, MD  gabapentin (NEURONTIN) 300 MG capsule Take 2 capsules (600 mg total) by mouth 3 (three) times daily. 06/27/22   Levert Feinstein, MD  Insulin Disposable Pump (OMNIPOD 5 G6 INTRO, GEN 5,) KIT 1 each by Does not apply route as needed. 02/20/22   Carlus Pavlov, MD   Insulin Disposable Pump (OMNIPOD 5 G6 POD, GEN 5,) MISC 1 each by Does not apply route every 3 (three) days. 02/20/22   Carlus Pavlov, MD  insulin lispro (HUMALOG) 100 UNIT/ML injection Inject 20 Units into the skin 3 (three) times daily with meals. Using in PUMP 09/29/22   [provider]  Insulin Lispro-aabc (LYUMJEV) 100 UNIT/ML SOLN Inject 80 Units into the skin daily. Via pump. 08/18/22   Carlus Pavlov, MD  Insulin Pen Needle 32G X 4 MM MISC Use 4-5x a day 05/26/22   Marguerita Merles Latif, DO  metoCLOPramide (REGLAN) 10 MG tablet Take 1 tablet (10 mg total) by mouth 3 (three) times daily as needed for nausea. 09/29/22   Shalhoub, Deno Lunger, MD  ondansetron (ZOFRAN) 4 MG tablet Take 1 tablet (4 mg total) by mouth every 8 (eight) hours as needed for nausea or vomiting. 10/28/22   Kathlen Mody, MD  ondansetron (ZOFRAN) 4 MG tablet Take 1 tablet (4 mg total) by mouth every 6 (six) hours as needed for nausea or vomiting. 12/29/22   [name], MD  pantoprazole (PROTONIX) 40 MG tablet Take 1 tablet (40 mg total) by mouth daily. 10/28/22 10/28/23  Kathlen Mody, MD  polyethylene glycol (MIRALAX / GLYCOLAX) 17 g packet Take 17 g by mouth daily as needed for moderate constipation. 05/26/22   Marguerita Merles Latif, DO  potassium chloride SA (KLOR-CON M) 20 MEQ tablet Take 2 tablets (40 mEq total) by mouth 2 (two) times daily. 12/29/22  [name], MD  promethazine (PHENERGAN) 25 MG suppository Place 1 suppository (25 mg total) rectally every 6 (six) hours as needed for nausea or vomiting. 12/29/22   [name], MD  promethazine (PHENERGAN) 25 MG tablet Take 1 tablet (25 mg total) by mouth every 6 (six) hours as needed for nausea or vomiting. 10/25/22   Kommor, Wyn Forster, MD      Allergies    Other and Peanut-containing drug products    Review of Systems   Review of Systems  All other systems reviewed and are negative.   Physical Exam Updated Vital Signs BP (!) 143/97    Pulse (!) 106   Temp 97.7 F (36.5 C) (Oral)   Resp 19   Ht 6\' 3"  (1.905 m)   Wt 59 kg   SpO2 99%   BMI 16.25 kg/m  Physical Exam Vitals and nursing note reviewed.  Constitutional:      General: He is in acute distress.     Appearance: He is well-developed. He is ill-appearing.  HENT:     Head: Normocephalic and atraumatic.     Mouth/Throat:     Mouth: Mucous membranes are dry.  Cardiovascular:     Rate and Rhythm: Regular rhythm. Tachycardia present.     Heart sounds: No murmur heard. Pulmonary:     Effort: Pulmonary effort is normal. No respiratory distress.     Breath sounds: Normal breath sounds.  Abdominal:     Palpations: Abdomen is soft.     Tenderness: There is no abdominal tenderness. There is no guarding or rebound.  Musculoskeletal:        General: No swelling or tenderness.  Skin:    General: Skin is warm and dry.  Neurological:     Mental Status: He is alert and oriented to person, place, and time.  Psychiatric:        Behavior: Behavior normal.     ED Results / Procedures / Treatments   Labs (all labs ordered are listed, but only abnormal results are displayed) Labs Reviewed  BASIC METABOLIC PANEL - Abnormal; Notable for the following components:      Result Value   Sodium 131 (*)    Chloride 91 (*)    CO2 17 (*)    Glucose, Bld 391 (*)    BUN 25 (*)    Anion gap 23 (*)    All other components within normal limits  HEPATIC FUNCTION PANEL - Abnormal; Notable for the following components:   Total Bilirubin 4.1 (*)    Bilirubin, Direct 0.4 (*)    Indirect Bilirubin 3.7 (*)    All other components within normal limits  CBG MONITORING, ED - Abnormal; Notable for the following components:   Glucose-Capillary 409 (*)    All other components within normal limits  CBC  URINALYSIS, ROUTINE W REFLEX MICROSCOPIC  BETA-HYDROXYBUTYRIC ACID    EKG None  Radiology No results found.  Procedures Procedures  {Document cardiac monitor, telemetry  assessment procedure when appropriate:1}  Medications Ordered in ED Medications - No data to display  ED Course/ Medical Decision Making/ A&P   {   Click here for ABCD2, HEART and other calculatorsREFRESH Note before signing :1}                          Medical Decision Making Amount and/or Complexity of Data Reviewed Labs: ordered.   ***  {Document critical care time when appropriate:1} {Document review of labs and  clinical decision tools ie heart score, Chads2Vasc2 etc:1}  {Document your independent review of radiology images, and any outside records:1} {Document your discussion with family members, caretakers, and with consultants:1} {Document social determinants of health affecting pt's care:1} {Document your decision making why or why not admission, treatments were needed:1} Final Clinical Impression(s) / ED Diagnoses Final diagnoses:  None    Rx / DC Orders ED Discharge Orders     None

## 2023-01-26 NOTE — Telephone Encounter (Signed)
Referral resent electronically and manually

## 2023-01-26 NOTE — Telephone Encounter (Signed)
Pt mom called and said she is still waiting for you to submit a referral for the gastro dr office at [facility]. Pt mom said she  called [facility] and they  never got the referral for pt the [facility] fax # is [phone] and dr. Clydie Braun a. Chachu.

## 2023-01-26 NOTE — Telephone Encounter (Signed)
Pt mom called and said she is still waiting for you to submit a referral for the gastro dr office at [facility]. Pt mom said she  called [facility] and they  never got the referral for pt the [facility] fax # is [phone] and dr. Clydie Braun a. Chachu.  ( MRN IS [id number] ) PT Edward Walls and D.O.B. is April 24, 1981

## 2023-01-26 NOTE — Telephone Encounter (Signed)
Can you look into this? There is a referral for GI with [facility] with provider Maeola Sarah

## 2023-01-27 DIAGNOSIS — E871 Hypo-osmolality and hyponatremia: Secondary | ICD-10-CM | POA: Diagnosis present

## 2023-01-27 DIAGNOSIS — E1010 Type 1 diabetes mellitus with ketoacidosis without coma: Secondary | ICD-10-CM

## 2023-01-27 DIAGNOSIS — E8729 Other acidosis: Secondary | ICD-10-CM | POA: Diagnosis present

## 2023-01-27 LAB — CBG MONITORING, ED
Glucose-Capillary: 156 mg/dL — ABNORMAL HIGH (ref 70–99)
Glucose-Capillary: 161 mg/dL — ABNORMAL HIGH (ref 70–99)
Glucose-Capillary: 166 mg/dL — ABNORMAL HIGH (ref 70–99)
Glucose-Capillary: 177 mg/dL — ABNORMAL HIGH (ref 70–99)
Glucose-Capillary: 190 mg/dL — ABNORMAL HIGH (ref 70–99)
Glucose-Capillary: 195 mg/dL — ABNORMAL HIGH (ref 70–99)
Glucose-Capillary: 195 mg/dL — ABNORMAL HIGH (ref 70–99)
Glucose-Capillary: 196 mg/dL — ABNORMAL HIGH (ref 70–99)
Glucose-Capillary: 197 mg/dL — ABNORMAL HIGH (ref 70–99)
Glucose-Capillary: 233 mg/dL — ABNORMAL HIGH (ref 70–99)
Glucose-Capillary: 298 mg/dL — ABNORMAL HIGH (ref 70–99)

## 2023-01-27 LAB — URINALYSIS, ROUTINE W REFLEX MICROSCOPIC
Bilirubin Urine: NEGATIVE
Glucose, UA: >=500 mg/dL — AB
Hgb urine dipstick: NEGATIVE
Ketones, ur: 80 mg/dL — AB
Leukocytes,Ua: NEGATIVE
Nitrite: NEGATIVE
Protein, ur: 30 mg/dL — AB
Specific Gravity, Urine: 1.026 (ref 1.005–1.030)
pH: 5 (ref 5.0–8.0)

## 2023-01-27 LAB — BASIC METABOLIC PANEL
Anion gap: 12 (ref 5–15)
Anion gap: 12 (ref 5–15)
BUN: 22 mg/dL — ABNORMAL HIGH (ref 6–20)
BUN: 20 mg/dL (ref 6–20)
CO2: 21 mmol/L — ABNORMAL LOW (ref 22–32)
CO2: 24 mmol/L (ref 22–32)
Calcium: 8.6 mg/dL — ABNORMAL LOW (ref 8.9–10.3)
Calcium: 8.5 mg/dL — ABNORMAL LOW (ref 8.9–10.3)
Chloride: 99 mmol/L (ref 98–111)
Chloride: 99 mmol/L (ref 98–111)
Creatinine, Ser: 0.94 mg/dL (ref 0.61–1.24)
Creatinine, Ser: 0.82 mg/dL (ref 0.61–1.24)
GFR, Estimated: >60 mL/min (ref >60)
GFR, Estimated: >60 mL/min (ref >60)
Glucose, Bld: 220 mg/dL — ABNORMAL HIGH (ref 70–99)
Glucose, Bld: 210 mg/dL — ABNORMAL HIGH (ref 70–99)
Potassium: 3.8 mmol/L (ref 3.5–5.1)
Potassium: 3.8 mmol/L (ref 3.5–5.1)
Sodium: 132 mmol/L — ABNORMAL LOW (ref 135–145)
Sodium: 135 mmol/L (ref 135–145)

## 2023-01-27 LAB — TROPONIN I (HIGH SENSITIVITY): Troponin I (High Sensitivity): 5 ng/L (ref <18)

## 2023-01-27 LAB — GLUCOSE, CAPILLARY
Glucose-Capillary: 104 mg/dL — ABNORMAL HIGH (ref 70–99)
Glucose-Capillary: 212 mg/dL — ABNORMAL HIGH (ref 70–99)
Glucose-Capillary: 190 mg/dL — ABNORMAL HIGH (ref 70–99)

## 2023-01-27 LAB — BETA-HYDROXYBUTYRIC ACID: Beta-Hydroxybutyric Acid: >8 mmol/L — ABNORMAL HIGH (ref 0.05–0.27)

## 2023-01-27 MED ORDER — ENOXAPARIN SODIUM 40 MG/0.4ML IJ SOSY
40.0000 mg | PREFILLED_SYRINGE | INTRAMUSCULAR | Status: DC
  Administered 2023-01-27 – 2023-01-29 (×3): 40 mg via SUBCUTANEOUS
  Filled 2023-01-27 (×3): qty 0.4

## 2023-01-27 MED ORDER — METOCLOPRAMIDE HCL 5 MG PO TABS
10.0000 mg | ORAL_TABLET | Freq: Three times a day (TID) | ORAL | Status: DC | PRN
  Administered 2023-01-27 – 2023-01-28 (×2): 10 mg via ORAL
  Filled 2023-01-27: qty 1
  Filled 2023-01-27: qty 2

## 2023-01-27 MED ORDER — INSULIN GLARGINE-YFGN 100 UNIT/ML ~~LOC~~ SOLN
20.0000 [IU] | Freq: Every day | SUBCUTANEOUS | Status: DC
  Administered 2023-01-27: 20 [IU] via SUBCUTANEOUS
  Filled 2023-01-27 (×2): qty 0.2

## 2023-01-27 MED ORDER — ALBUTEROL SULFATE (2.5 MG/3ML) 0.083% IN NEBU: 2.5000 mg | INHALATION_SOLUTION | RESPIRATORY_TRACT | Status: DC | PRN

## 2023-01-27 MED ORDER — ACETAMINOPHEN 325 MG PO TABS: 650.0000 mg | ORAL_TABLET | Freq: Four times a day (QID) | ORAL | Status: DC | PRN

## 2023-01-27 MED ORDER — SENNOSIDES-DOCUSATE SODIUM 8.6-50 MG PO TABS: 1.0000 | ORAL_TABLET | Freq: Every evening | ORAL | Status: DC | PRN

## 2023-01-27 MED ORDER — ONDANSETRON HCL 4 MG/2ML IJ SOLN
4.0000 mg | Freq: Once | INTRAMUSCULAR | Status: AC
  Administered 2023-01-27: 4 mg via INTRAVENOUS
  Filled 2023-01-27: qty 2

## 2023-01-27 MED ORDER — SODIUM CHLORIDE 0.9 % IV SOLN: INTRAVENOUS | Status: DC

## 2023-01-27 MED ORDER — INSULIN ASPART 100 UNIT/ML IJ SOLN
0.0000 [IU] | Freq: Every day | INTRAMUSCULAR | Status: DC
  Administered 2023-01-28: 2 [IU] via SUBCUTANEOUS
  Filled 2023-01-27: qty 0.05

## 2023-01-27 MED ORDER — ACETAMINOPHEN 650 MG RE SUPP: 650.0000 mg | Freq: Four times a day (QID) | RECTAL | Status: DC | PRN

## 2023-01-27 MED ORDER — METOPROLOL TARTRATE 5 MG/5ML IV SOLN: 5.0000 mg | Freq: Four times a day (QID) | INTRAVENOUS | Status: DC | PRN

## 2023-01-27 MED ORDER — PANTOPRAZOLE SODIUM 40 MG IV SOLR
40.0000 mg | INTRAVENOUS | Status: DC
  Administered 2023-01-27 – 2023-01-29 (×3): 40 mg via INTRAVENOUS
  Filled 2023-01-27 (×3): qty 10

## 2023-01-27 MED ORDER — INSULIN ASPART 100 UNIT/ML IJ SOLN
0.0000 [IU] | Freq: Three times a day (TID) | INTRAMUSCULAR | Status: DC
  Administered 2023-01-27: 5 [IU] via SUBCUTANEOUS
  Administered 2023-01-27: 3 [IU] via SUBCUTANEOUS
  Administered 2023-01-28: 5 [IU] via SUBCUTANEOUS
  Administered 2023-01-28: 2 [IU] via SUBCUTANEOUS
  Administered 2023-01-29: 3 [IU] via SUBCUTANEOUS
  Administered 2023-01-29: 2 [IU] via SUBCUTANEOUS
  Filled 2023-01-27: qty 0.15

## 2023-01-27 NOTE — H&P (Signed)
History and Physical  Edward Walls QIO:[id number] DOB: 01-21-1981 DOA: 01/26/2023  PCP: Mliss Sax, MD   Chief Complaint: DKA  HPI: Edward Walls is a 42 y.o. male with medical history significant for insulin-dependent type 1 diabetes and gastroparesis who presented to the emergency department with concerns that he might be in DKA.  He vomits quite frequently, but has been more frequent in the last few days, and he is also noticed a dramatic rise in his blood sugars.  He has continued to take his insulin, he found his insulin pump bothersome as it was waking him up at night with alarms, so he has not used it in the last month.  However he has been taking Lantus and short acting insulin on sliding scale.  Had some subjective fever, but no chills, no diarrhea/constipation, no dysuria.  Denies any rashes or sores on the skin.  ED Course: Presented to the emergency department, blood sugar was over 400, vital signs were stable but tachycardic.  Had anion gap acidosis, and elevated beta hydroxybutyrate.  Given IV fluid bolus, started on maintenance IV fluids, as well as insulin drip.  Currently: He is resting comfortably on the stretcher in the ER, still quite nauseous, has been throwing up intermittently despite Zofran.  Remains on insulin drip, anion gap is closed and blood sugars are much improved.  Although he is quite nauseous, says he has not eaten in 5 days and would like to try some p.o. liquids.  Denies any significant abdominal pain.  Review of Systems: Please see HPI for pertinent positives and negatives. A complete 10 system review of systems are otherwise negative.  Past Medical History:  Diagnosis Date   Diabetes mellitus without complication    Leg pain    Leg weakness    Seasonal allergies    Past Surgical History:  Procedure Laterality Date   DENTAL SURGERY     ESOPHAGOGASTRODUODENOSCOPY (EGD) WITH PROPOFOL N/A 10/27/2022   Procedure:  ESOPHAGOGASTRODUODENOSCOPY (EGD) WITH PROPOFOL;  Surgeon: Sherrilyn Rist, MD;  Location: [facility] ENDOSCOPY;  Service: Gastroenterology;  Laterality: N/A;    Social History:  reports that he has quit smoking. His smoking use included cigarettes. He has never used smokeless tobacco. He reports current alcohol use. He reports that he does not use drugs.   Allergies  Allergen Reactions   Other Other (See Comments)    All nuts Throat pain   Peanut-Containing Drug Products Other (See Comments)    Throat pain    Family History  Problem Relation Age of Onset   Diabetes Mellitus II Mother    Diabetes Mellitus II Father    Diabetes Mellitus II Brother      Prior to Admission medications   Medication Sig Start Date End Date Taking? Authorizing Provider  insulin lispro (HUMALOG) 100 UNIT/ML injection Inject 20 Units into the skin 3 (three) times daily with meals. Using in PUMP 09/29/22  Yes [provider]  Insulin Lispro-aabc (LYUMJEV) 100 UNIT/ML SOLN Inject 80 Units into the skin daily. Via pump. 08/18/22  Yes Carlus Pavlov, MD  metoCLOPramide (REGLAN) 10 MG tablet Take 1 tablet (10 mg total) by mouth 3 (three) times daily as needed for nausea. 09/29/22  Yes Shalhoub, Deno Lunger, MD  ondansetron (ZOFRAN) 4 MG tablet Take 1 tablet (4 mg total) by mouth every 6 (six) hours as needed for nausea or vomiting. 12/29/22  Yes Countryman, Chase, MD  polyethylene glycol (MIRALAX / GLYCOLAX) 17 g packet Take 17  g by mouth daily as needed for moderate constipation. 05/26/22  Yes Sheikh, Omair Latif, DO  potassium chloride SA (KLOR-CON M) 20 MEQ tablet Take 2 tablets (40 mEq total) by mouth 2 (two) times daily. 12/29/22  Yes [name], MD  promethazine (PHENERGAN) 25 MG suppository Place 1 suppository (25 mg total) rectally every 6 (six) hours as needed for nausea or vomiting. 12/29/22  Yes Countryman, Chase, MD  promethazine (PHENERGAN) 25 MG tablet Take 1 tablet (25 mg total) by mouth every 6  (six) hours as needed for nausea or vomiting. 10/25/22  Yes Kommor, Madison, MD  amoxicillin-clavulanate (AUGMENTIN) 875-125 MG tablet Take 1 tablet by mouth 2 (two) times daily. Patient not taking: Reported on 01/27/2023 01/03/23   Mliss Sax, MD  Continuous Blood Gluc Sensor (DEXCOM G6 SENSOR) MISC Replace every 10 days 08/11/22   Carlus Pavlov, MD  Continuous Blood Gluc Sensor (FREESTYLE LIBRE 2 SENSOR) MISC CHANGE SENSOR EVERY 14 DAYS 08/04/22   Mliss Sax, MD  Continuous Blood Gluc Transmit (DEXCOM G6 TRANSMITTER) MISC 1 Device by Does not apply route every 3 (three) months. 02/20/22   Carlus Pavlov, MD  Continuous Blood Gluc Transmit (DEXCOM G6 TRANSMITTER) MISC 1 Device by Does not apply route every 3 (three) months. 02/20/22   Carlus Pavlov, MD  gabapentin (NEURONTIN) 300 MG capsule Take 2 capsules (600 mg total) by mouth 3 (three) times daily. Patient not taking: Reported on 01/27/2023 06/27/22   Levert Feinstein, MD  Insulin Disposable Pump (OMNIPOD 5 G6 INTRO, GEN 5,) KIT 1 each by Does not apply route as needed. 02/20/22   Carlus Pavlov, MD  Insulin Disposable Pump (OMNIPOD 5 G6 POD, GEN 5,) MISC 1 each by Does not apply route every 3 (three) days. 02/20/22   Carlus Pavlov, MD  Insulin Pen Needle 32G X 4 MM MISC Use 4-5x a day 05/26/22   Marguerita Merles Latif, DO  ondansetron (ZOFRAN) 4 MG tablet Take 1 tablet (4 mg total) by mouth every 8 (eight) hours as needed for nausea or vomiting. Patient not taking: Reported on 01/27/2023 10/28/22   Kathlen Mody, MD  pantoprazole (PROTONIX) 40 MG tablet Take 1 tablet (40 mg total) by mouth daily. Patient not taking: Reported on 01/27/2023 10/28/22 10/28/23  Kathlen Mody, MD    Physical Exam: BP (!) 119/92   Pulse 93   Temp 98 F (36.7 C) (Oral)   Resp 11   Ht  (1.905 m)   Wt 59 kg   SpO2 100%   BMI 16.25 kg/m   General: Tired appearing, thin, well-developed well-nourished male lying on a stretcher in the ER,  holding emesis bag. Eyes: EOMI, clear conjuctivae, white sclerea Neck: supple, no masses, trachea mildline  Cardiovascular: RRR, no murmurs or rubs, no peripheral edema  Respiratory: clear to auscultation bilaterally, no wheezes, no crackles  Abdomen: soft, nontender, nondistended, normal bowel tones heard, some voluntary guarding.  No peritoneal signs Skin: dry, no rashes  Musculoskeletal: no joint effusions, normal range of motion  Psychiatric: appropriate affect, normal speech  Neurologic: extraocular muscles intact, clear speech, moving all extremities with intact sensorium          Labs on Admission:  Basic Metabolic Panel: Recent Labs  Lab 01/26/23 2141 01/27/23 0231 01/27/23 0541  NA 131* 132* 135  K 4.4 3.8 3.8  CL 91* 99 99  CO2 17* 21* 24  GLUCOSE 391* 220* 210*  BUN 25* 22* 20  CREATININE 1.08 0.94 0.82  CALCIUM 8.9  8.6* 8.5*   Liver Function Tests: Recent Labs  Lab 01/26/23 2154  AST 19  ALT 21  ALKPHOS 60  BILITOT 4.1*  PROT 7.6  ALBUMIN 4.1   Recent Labs  Lab 01/26/23 2154  LIPASE 28   No results for input(s): "AMMONIA" in the last 168 hours. CBC: Recent Labs  Lab 01/26/23 2141  WBC 6.4  HGB 14.9  HCT 46.5  MCV 87.4  PLT 386   Cardiac Enzymes: No results for input(s): "CKTOTAL", "CKMB", "CKMBINDEX", "TROPONINI" in the last 168 hours.  BNP (last 3 results) No results for input(s): "BNP" in the last 8760 hours.  ProBNP (last 3 results) No results for input(s): "PROBNP" in the last 8760 hours.  CBG: Recent Labs  Lab 01/27/23 0317 01/27/23 0424 01/27/23 0537 01/27/23 0635 01/27/23 0736  GLUCAP 195* 177* 196* 166* 195*    Radiological Exams on Admission: No results found.  Assessment/Plan Principal Problem:   DKA (diabetic ketoacidosis)-patient presented with hyperglycemia, anion gap metabolic acidosis, elevated beta hydroxybutyric acid.  Likely due to dehydration due to his acute on chronic vomiting.  No evidence of inciting  infection. -Observation admission -Continue IV fluids -Anion gap is closed x 2, will discontinue and start basal bolus insulin dosing -Lantus 20 units now -Moderate dose sliding scale insulin -Okay for clear liquid diet, orders placed to advance as tolerated -IV Zofran and oral Reglan as needed nausea/vomiting.  Active Problems:   Nausea & vomiting   Diabetes mellitus type 1.5, managed as type 1   Dyslipidemia   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   Hyponatremia-resolved   Increased anion gap metabolic acidosis-resolved  DVT prophylaxis: Lovenox     Code Status: Full Code  Consults called: None  Admission status: Observation  Time spent: 35 minutes  Malvin Morrish Sharlette Dense MD [facility] Pager [phone]  If 7PM-7AM, please contact night-coverage [email or web address] Password [facility]  01/27/2023, 7:48 AM

## 2023-01-27 NOTE — ED Notes (Signed)
Delay in pt going to the floor- waiting on semglee since 0745. Cannot d/c insulin infusion until 2 hours after administering semglee.

## 2023-01-27 NOTE — ED Notes (Signed)
ED TO INPATIENT HANDOFF REPORT  ED Nurse Name and Phone #:  Elis Rawlinson  S Name/Age/Gender Edward Walls 42 y.o. male Room/Bed: RESA/RESA  Code Status   Code Status: Full Code  Home/SNF/Other Home Patient oriented to: self, place, time, and situation Is this baseline? Yes   Triage Complete: Triage complete  Chief Complaint DKA (diabetic ketoacidosis) [E11.10]  Triage Note Patient BIB EMS. Patient reports high blood sugars starting 2 days ago. Patient's dexcom read high this evening. Last dose of insulin was this AM. Patient reports nausea and vomiting "for a while," patient seeing GI for gastroparesis currently. Patients BGL is 409 on arrival. Patient received 4 mg of zofran with ems.    Allergies Allergies  Allergen Reactions   Other Other (See Comments)    All nuts Throat pain   Peanut-Containing Drug Products Other (See Comments)    Throat pain    Level of Care/Admitting Diagnosis ED Disposition     ED Disposition  Admit   Condition  --   Comment  Hospital Area: [facility] [[id number]]  Level of Care: Med-Surg [16]  May place patient in observation at [facility] or [facility] if equivalent level of care is available:: Yes  Covid Evaluation: Asymptomatic - no recent exposure (last 10 days) testing not required  Diagnosis: DKA (diabetic ketoacidosis) [161096]  Admitting Physician: Maryln Gottron [[id number]]  Attending Physician: Kirby Crigler, MIR [email or web address] [[id number]]          B Medical/Surgery History Past Medical History:  Diagnosis Date   Diabetes mellitus without complication    Leg pain    Leg weakness    Seasonal allergies    Past Surgical History:  Procedure Laterality Date   DENTAL SURGERY     ESOPHAGOGASTRODUODENOSCOPY (EGD) WITH PROPOFOL N/A 10/27/2022   Procedure: ESOPHAGOGASTRODUODENOSCOPY (EGD) WITH PROPOFOL;  Surgeon: Sherrilyn Rist, MD;  Location: [facility];  Service: Gastroenterology;  Laterality: N/A;     A IV  Location/Drains/Wounds Patient Lines/Drains/Airways Status     Active Line/Drains/Airways     Name Placement date Placement time Site Days   Peripheral IV 01/26/23 22 G Left;Posterior Hand 01/26/23  2139  Hand  1   Peripheral IV 01/26/23 20 G Right Antecubital 01/26/23  2335  Antecubital  1            Intake/Output Last 24 hours  Intake/Output Summary (Last 24 hours) at 01/27/2023 1214 Last data filed at 01/27/2023 1135 Gross per 24 hour  Intake 3270.98 ml  Output 600 ml  Net 2670.98 ml    Labs/Imaging Results for orders placed or performed during the hospital encounter of 01/26/23 (from the past 48 hour(s))  Urinalysis, Routine w reflex microscopic -Urine, Clean Catch     Status: Abnormal   Collection Time: 01/26/23  2:14 AM  Result Value Ref Range   Color, Urine YELLOW YELLOW   APPearance CLEAR CLEAR   Specific Gravity, Urine 1.026 1.005 - 1.030   pH 5.0 5.0 - 8.0   Glucose, UA >=500 (A) NEGATIVE mg/dL   Hgb urine dipstick NEGATIVE NEGATIVE   Bilirubin Urine NEGATIVE NEGATIVE   Ketones, ur 80 (A) NEGATIVE mg/dL   Protein, ur 30 (A) NEGATIVE mg/dL   Nitrite NEGATIVE NEGATIVE   Leukocytes,Ua NEGATIVE NEGATIVE   RBC / HPF 0-5 0 - 5 RBC/hpf   WBC, UA 0-5 0 - 5 WBC/hpf   Bacteria, UA RARE (A) NONE SEEN   Squamous Epithelial / HPF 0-5 0 - 5 /HPF  Mucus PRESENT    Hyaline Casts, UA PRESENT     Comment: Performed at [facility], [address] [place], [place] [address]  CBG monitoring, ED     Status: Abnormal   Collection Time: 01/26/23  9:31 PM  Result Value Ref Range   Glucose-Capillary 409 (H) 70 - 99 mg/dL    Comment: Glucose reference range applies only to samples taken after fasting for at least 8 hours.  Basic metabolic panel     Status: Abnormal   Collection Time: 01/26/23  9:41 PM  Result Value Ref Range   Sodium 131 (L) 135 - 145 mmol/L    Comment: ELECTROLYTES REPEATED TO VERIFY   Potassium 4.4 3.5 - 5.1 mmol/L    Comment:  ELECTROLYTES REPEATED TO VERIFY   Chloride 91 (L) 98 - 111 mmol/L    Comment: ELECTROLYTES REPEATED TO VERIFY   CO2 17 (L) 22 - 32 mmol/L    Comment: ELECTROLYTES REPEATED TO VERIFY   Glucose, Bld 391 (H) 70 - 99 mg/dL    Comment: Glucose reference range applies only to samples taken after fasting for at least 8 hours.   BUN 25 (H) 6 - 20 mg/dL   Creatinine, Ser 2.75 0.61 - 1.24 mg/dL   Calcium 8.9 8.9 - 17.0 mg/dL   GFR, Estimated >01 >74 mL/min    Comment: (NOTE) Calculated using the CKD-EPI Creatinine Equation (2021)    Anion gap 23 (H) 5 - 15    Comment: ELECTROLYTES REPEATED TO VERIFY Performed at [facility] [address], [place], [place] [address]   CBC     Status: None   Collection Time: 01/26/23  9:41 PM  Result Value Ref Range   WBC 6.4 4.0 - 10.5 K/uL   RBC 5.32 4.22 - 5.81 MIL/uL   Hemoglobin 14.9 13.0 - 17.0 g/dL   HCT 75.9 16.3 - 84.6 %   MCV 87.4 80.0 - 100.0 fL   MCH 28.0 26.0 - 34.0 pg   MCHC 32.0 30.0 - 36.0 g/dL   RDW 65.9 93.5 - 70.1 %   Platelets 386 150 - 400 K/uL   nRBC 0.0 0.0 - 0.2 %    Comment: Performed at [facility], [address] [place], [place] [address]  Hepatic function panel     Status: Abnormal   Collection Time: 01/26/23  9:54 PM  Result Value Ref Range   Total Protein 7.6 6.5 - 8.1 g/dL   Albumin 4.1 3.5 - 5.0 g/dL   AST 19 15 - 41 U/L   ALT 21 0 - 44 U/L   Alkaline Phosphatase 60 38 - 126 U/L   Total Bilirubin 4.1 (H) 0.3 - 1.2 mg/dL   Bilirubin, Direct 0.4 (H) 0.0 - 0.2 mg/dL   Indirect Bilirubin 3.7 (H) 0.3 - 0.9 mg/dL    Comment: Performed at [facility], [address], [place], [place] [address]  Beta-hydroxybutyric acid     Status: Abnormal   Collection Time: 01/26/23  9:54 PM  Result Value Ref Range   Beta-Hydroxybutyric Acid >8.00 (H) 0.05 - 0.27 mmol/L    Comment: RESULT CONFIRMED BY MANUAL DILUTION Performed at [facility], [address] [place], [place] [address]   Lipase, blood     Status: None   Collection Time: 01/26/23  9:54 PM  Result Value Ref Range   Lipase 28 11 - 51 U/L    Comment: Performed at [facility], [address] [place], [place] [address]  Troponin I (High Sensitivity)     Status: None   Collection Time: 01/26/23  9:54 PM  Result Value Ref Range   Troponin I (High Sensitivity) 5 <18 ng/L    Comment: (NOTE) Elevated high sensitivity troponin I (hsTnI) values and significant  changes across serial measurements may suggest ACS but many other  chronic and acute conditions are known to elevate hsTnI results.  Refer to the "Links" section for chest pain algorithms and additional  guidance. Performed at [facility], [address], [place], [place] [address]   Blood gas, venous     Status: Abnormal   Collection Time: 01/26/23 11:21 PM  Result Value Ref Range   pH, Ven 7.32 7.25 - 7.43   pCO2, Ven 28 (L) 44 - 60 mmHg   pO2, Ven 56 (H) 32 - 45 mmHg   Bicarbonate 14.4 (L) 20.0 - 28.0 mmol/L   Acid-base deficit 10.2 (H) 0.0 - 2.0 mmol/L   O2 Saturation 87.7 %   Patient temperature 37.0     Comment: Performed at [facility], [address] [place], [place] [address]  CBG monitoring, ED     Status: Abnormal   Collection Time: 01/26/23 11:44 PM  Result Value Ref Range   Glucose-Capillary 391 (H) 70 - 99 mg/dL    Comment: Glucose reference range applies only to samples taken after fasting for at least 8 hours.  CBG monitoring, ED     Status: Abnormal   Collection Time: 01/27/23 12:54 AM  Result Value Ref Range   Glucose-Capillary 298 (H) 70 - 99 mg/dL    Comment: Glucose reference range applies only to samples taken after fasting for at least 8 hours.  CBG monitoring, ED     Status: Abnormal   Collection Time: 01/27/23  2:12 AM  Result Value Ref Range   Glucose-Capillary 197 (H) 70 - 99 mg/dL    Comment: Glucose reference range  applies only to samples taken after fasting for at least 8 hours.  Basic metabolic panel     Status: Abnormal   Collection Time: 01/27/23  2:31 AM  Result Value Ref Range   Sodium 132 (L) 135 - 145 mmol/L   Potassium 3.8 3.5 - 5.1 mmol/L   Chloride 99 98 - 111 mmol/L   CO2 21 (L) 22 - 32 mmol/L   Glucose, Bld 220 (H) 70 - 99 mg/dL    Comment: Glucose reference range applies only to samples taken after fasting for at least 8 hours.   BUN 22 (H) 6 - 20 mg/dL   Creatinine, Ser 0.86 0.61 - 1.24 mg/dL   Calcium 8.6 (L) 8.9 - 10.3 mg/dL   GFR, Estimated >57 >84 mL/min    Comment: (NOTE) Calculated using the CKD-EPI Creatinine Equation (2021)    Anion gap 12 5 - 15    Comment: Performed at [facility], [address] [place], [place] [address]  CBG monitoring, ED     Status: Abnormal   Collection Time: 01/27/23  3:17 AM  Result Value Ref Range   Glucose-Capillary 195 (H) 70 - 99 mg/dL    Comment: Glucose reference range applies only to samples taken after fasting for at least 8 hours.  CBG monitoring, ED     Status: Abnormal   Collection Time: 01/27/23  4:24 AM  Result Value Ref Range   Glucose-Capillary 177 (H) 70 - 99 mg/dL    Comment:  Glucose reference range applies only to samples taken after fasting for at least 8 hours.  CBG monitoring, ED     Status: Abnormal   Collection Time: 01/27/23  5:37 AM  Result Value Ref Range   Glucose-Capillary 196 (H) 70 - 99 mg/dL    Comment: Glucose reference range applies only to samples taken after fasting for at least 8 hours.  Basic metabolic panel     Status: Abnormal   Collection Time: 01/27/23  5:41 AM  Result Value Ref Range   Sodium 135 135 - 145 mmol/L   Potassium 3.8 3.5 - 5.1 mmol/L   Chloride 99 98 - 111 mmol/L   CO2 24 22 - 32 mmol/L   Glucose, Bld 210 (H) 70 - 99 mg/dL    Comment: Glucose reference range applies only to samples taken after fasting for at least 8 hours.   BUN 20 6 - 20 mg/dL   Creatinine,  Ser 1.91 0.61 - 1.24 mg/dL   Calcium 8.5 (L) 8.9 - 10.3 mg/dL   GFR, Estimated >47 >82 mL/min    Comment: (NOTE) Calculated using the CKD-EPI Creatinine Equation (2021)    Anion gap 12 5 - 15    Comment: Performed at [facility], [address], [place], [place] [address]  CBG monitoring, ED     Status: Abnormal   Collection Time: 01/27/23  6:35 AM  Result Value Ref Range   Glucose-Capillary 166 (H) 70 - 99 mg/dL    Comment: Glucose reference range applies only to samples taken after fasting for at least 8 hours.  CBG monitoring, ED     Status: Abnormal   Collection Time: 01/27/23  7:36 AM  Result Value Ref Range   Glucose-Capillary 195 (H) 70 - 99 mg/dL    Comment: Glucose reference range applies only to samples taken after fasting for at least 8 hours.  CBG monitoring, ED     Status: Abnormal   Collection Time: 01/27/23  8:36 AM  Result Value Ref Range   Glucose-Capillary 156 (H) 70 - 99 mg/dL    Comment: Glucose reference range applies only to samples taken after fasting for at least 8 hours.  CBG monitoring, ED     Status: Abnormal   Collection Time: 01/27/23  9:29 AM  Result Value Ref Range   Glucose-Capillary 161 (H) 70 - 99 mg/dL    Comment: Glucose reference range applies only to samples taken after fasting for at least 8 hours.  CBG monitoring, ED     Status: Abnormal   Collection Time: 01/27/23 10:37 AM  Result Value Ref Range   Glucose-Capillary 233 (H) 70 - 99 mg/dL    Comment: Glucose reference range applies only to samples taken after fasting for at least 8 hours.  CBG monitoring, ED     Status: Abnormal   Collection Time: 01/27/23 11:32 AM  Result Value Ref Range   Glucose-Capillary 190 (H) 70 - 99 mg/dL    Comment: Glucose reference range applies only to samples taken after fasting for at least 8 hours.   No results found.  Pending Labs Unresulted Labs (From admission, onward)     Start     Ordered   02/03/23 0500  Creatinine, serum   (enoxaparin (LOVENOX)    CrCl >/= 30 ml/min)  Weekly,   R     Comments: while on enoxaparin therapy    01/27/23 0745            Vitals/Pain Today's Vitals  01/27/23 1130 01/27/23 1142 01/27/23 1145 01/27/23 1200  BP: 133/86 100/80 100/80 121/77  Pulse: (!) 114 88 86 88  Resp: Temp:  97.9 F (36.6 C)    TempSrc:  Oral    SpO2: 99% 97% 96% 100%  Weight:      Height:      PainSc:        Isolation Precautions No active isolations  Medications Medications  lactated ringers infusion (0 mLs Intravenous Stopped 01/27/23 0216)  dextrose 50 % solution 0-50 mL (has no administration in time range)  metoCLOPramide (REGLAN) tablet 10 mg (10 mg Oral Given 01/27/23 0838)  pantoprazole (PROTONIX) injection 40 mg (40 mg Intravenous Given 01/27/23 0839)  insulin aspart (novoLOG) injection 0-15 Units (3 Units Subcutaneous Given 01/27/23 1146)  insulin aspart (novoLOG) injection 0-5 Units (has no administration in time range)  insulin glargine-yfgn (SEMGLEE) injection 20 Units (20 Units Subcutaneous Given 01/27/23 0935)  enoxaparin (LOVENOX) injection 40 mg (40 mg Subcutaneous Given 01/27/23 0839)  acetaminophen (TYLENOL) tablet 650 mg (has no administration in time range)    Or  acetaminophen (TYLENOL) suppository 650 mg (has no administration in time range)  senna-docusate (Senokot-S) tablet 1 tablet (has no administration in time range)  metoprolol tartrate (LOPRESSOR) injection 5 mg (has no administration in time range)  albuterol (PROVENTIL) (2.5 MG/3ML) 0.083% nebulizer solution 2.5 mg (has no administration in time range)  0.9 %  sodium chloride infusion ( Intravenous New Bag/Given 01/27/23 1135)  lactated ringers bolus 1,180 mL (0 mLs Intravenous Stopped 01/27/23 0038)  potassium chloride 10 mEq in 100 mL IVPB (0 mEq Intravenous Stopped 01/27/23 0216)  fentaNYL (SUBLIMAZE) injection 50 mcg (50 mcg Intravenous Given 01/26/23 2334)  metoCLOPramide (REGLAN) injection 10 mg (10  mg Intravenous Given 01/26/23 2335)  ondansetron (ZOFRAN) injection 4 mg (4 mg Intravenous Given 01/27/23 0158)  ondansetron (ZOFRAN) injection 4 mg (4 mg Intravenous Given 01/27/23 0540)    Mobility walks       R Recommendations: See Admitting Provider Note  Report given to:   Additional Notes:

## 2023-01-27 NOTE — ED Notes (Signed)
Breakfast tray given. °

## 2023-01-28 DIAGNOSIS — E1043 Type 1 diabetes mellitus with diabetic autonomic (poly)neuropathy: Secondary | ICD-10-CM

## 2023-01-28 DIAGNOSIS — K3184 Gastroparesis: Secondary | ICD-10-CM

## 2023-01-28 DIAGNOSIS — G63 Polyneuropathy in diseases classified elsewhere: Secondary | ICD-10-CM | POA: Diagnosis not present

## 2023-01-28 DIAGNOSIS — E1010 Type 1 diabetes mellitus with ketoacidosis without coma: Secondary | ICD-10-CM | POA: Diagnosis not present

## 2023-01-28 LAB — GLUCOSE, CAPILLARY
Glucose-Capillary: 210 mg/dL — ABNORMAL HIGH (ref 70–99)
Glucose-Capillary: 256 mg/dL — ABNORMAL HIGH (ref 70–99)
Glucose-Capillary: 150 mg/dL — ABNORMAL HIGH (ref 70–99)
Glucose-Capillary: 207 mg/dL — ABNORMAL HIGH (ref 70–99)

## 2023-01-28 MED ORDER — METOCLOPRAMIDE HCL 5 MG/ML IJ SOLN
10.0000 mg | Freq: Three times a day (TID) | INTRAMUSCULAR | Status: DC
  Administered 2023-01-28 – 2023-01-29 (×4): 10 mg via INTRAVENOUS
  Filled 2023-01-28 (×4): qty 2

## 2023-01-28 MED ORDER — INSULIN GLARGINE-YFGN 100 UNIT/ML ~~LOC~~ SOLN
25.0000 [IU] | Freq: Every day | SUBCUTANEOUS | Status: DC
  Administered 2023-01-28 – 2023-01-29 (×2): 25 [IU] via SUBCUTANEOUS
  Filled 2023-01-28 (×2): qty 0.25

## 2023-01-28 MED ORDER — LIDOCAINE 5 % EX PTCH
1.0000 | MEDICATED_PATCH | CUTANEOUS | Status: DC
  Administered 2023-01-28 – 2023-01-29 (×2): 1 via TRANSDERMAL
  Filled 2023-01-28 (×2): qty 1

## 2023-01-28 MED ORDER — INSULIN ASPART 100 UNIT/ML IJ SOLN
5.0000 [IU] | Freq: Three times a day (TID) | INTRAMUSCULAR | Status: DC
  Administered 2023-01-29 (×2): 5 [IU] via SUBCUTANEOUS

## 2023-01-28 MED ORDER — GABAPENTIN 300 MG PO CAPS
300.0000 mg | ORAL_CAPSULE | Freq: Three times a day (TID) | ORAL | Status: DC
  Administered 2023-01-28 – 2023-01-29 (×4): 300 mg via ORAL
  Filled 2023-01-28 (×4): qty 1

## 2023-01-28 MED ORDER — PROCHLORPERAZINE EDISYLATE 10 MG/2ML IJ SOLN: 10.0000 mg | Freq: Four times a day (QID) | INTRAMUSCULAR | Status: DC | PRN

## 2023-01-28 NOTE — Progress Notes (Addendum)
PROGRESS NOTE    Edward Walls  WUJ:[id number] DOB: May 18, 1981 DOA: 01/26/2023 PCP: Mliss Sax, MD    Brief Narrative:   Edward Walls is a 42 y.o. male with past medical history significant for type 1 diabetes mellitus, diabetic gastroparesis, peripheral neuropathy who presented to [facility] ED on 4/13 via EMS from home with high blood sugars over the last 2 days.  Patient has had increased nausea/vomiting over the last 2 days and noticed a dramatic rise in his blood sugar.  He previously was on an insulin pump but since has been on Lantus 30 units daily and short acting insulin 12 units with meals.  Despite use, his blood sugar was reading high on his Dexcom monitor.  On EMS arrival, patient was transported to ED for further evaluation and given IV Zofran.  In the ED, temperature 98.0 F, HR 120, RR 17, BP 124/91, SpO2 96% on room air.  Glucose on arrival was 409.  WBC 6.4, hemoglobin 14.9, platelets 386.  Sodium 131, potassium 4.4, chloride 91, CO2 17, BUN 25, creatinine 1.08.  Anion gap 23.  Urinalysis with greater than 500 glucose, 80 ketones.  Beta hydroxybutyrate acid greater than 8.00.  Patient was given IV fluid bolus, started on insulin drip.  TRH consulted for admission for further evaluation and management of diabetic ketoacidosis  Assessment & Plan:   Diabetic ketoacidosis Type 1 diabetes mellitus Patient presenting to ED with nausea/vomiting over the last 2 days.  On arrival was noted to have a glucose of 409, anion gap of 23, beta hydroxybutyrate acid greater than 8.00 and 80 ketones on urinalysis.  Patient was initially started on a insulin drip and transition back to subcutaneous insulin following anion gap closure.  Home regimen includes Lantus 30 units daily, insulin lispro 12 units 3 times daily AC.  Last hemoglobin A1c 10.6 on 09/29/2022. -- Diabetic educator consulted -- Update hemoglobin A1c -- Semglee increased to 25u [place] daily today --  Novolog 5u TIDAC -- moderate SSI for coverage -- CBGs qAC/hs  Diabetic gastroparesis Seen by [facility] in the past, Dr. Myrtie Neither; underwent EGD 09/2022 with findings LA grade A esophagitis likely secondary to vomiting.  No other further recommendations and if unable to tolerate oral intake recommended being seen at a tertiary academic center specializing in dysmotility orders and may end up needing surgical placement of J-tube. -- Reglan 10 mg IV TID scheduled -- Compazine 10 mg IV every 6 hours as needed nausea/vomiting  Peripheral neuropathy -- Gabapentin 300 mg p.o. 3 times daily  Low back pain, chronic -- Lidocaine patch  Severe protein calorie malnutrition Body mass index is 16.25 kg/m.  Etiology likely secondary to his chronic medical condition, poorly controlled diabetes with notable muscle mass/fat wasting/depletion on physical exam. -- Dietitian consult    DVT prophylaxis: enoxaparin (LOVENOX) injection 40 mg Start: 01/27/23 0800 SCDs Start: 01/27/23 0745    Code Status: Full Code Family Communication: No family present at bedside this morning  Disposition Plan:  Level of care: Med-Surg Status is: Observation The patient remains OBS appropriate and will d/c before 2 midnights.    Consultants:  None  Procedures:  None  Antimicrobials:  None   Subjective: Patient seen examined bedside, resting comfortably.  Sleeping but easy arousable.  Continues with mild nausea.  Blood sugars also remain not well-controlled.  Discussed will increase long-acting insulin today as well as add mealtime insulin.  Will also schedule IV Reglan as well.  Patient requesting  restart of his gabapentin.  No other specific questions or concerns at this time.  Denies headache, no visual changes, no chest pain, no palpitations, no shortness of breath, no current abdominal pain, no fever/chills/night sweats, no current vomiting/diarrhea, no cough/congestion, no focal weakness, no fatigue, no  paresthesias.  No acute events overnight per nursing staff.  Objective: Vitals:   01/27/23 1200 01/27/23 1318 01/27/23 1925 01/28/23 0611  BP: 121/77 124/85 (!) 133/91 133/86  Pulse: 88 80 89 99  Resp: Temp:   98.4 F (36.9 C) 98.6 F (37 C)  TempSrc:      SpO2: 100% 100% 100% 100%  Weight:      Height:        Intake/Output Summary (Last 24 hours) at 01/28/2023 1227 Last data filed at 01/28/2023 0423 Gross per 24 hour  Intake 1212.9 ml  Output 1000 ml  Net 212.9 ml   Filed Weights   01/26/23 2134  Weight: 59 kg    Examination:  Physical Exam: GEN: NAD, alert and oriented x 3, thin in appearance HEENT: NCAT, PERRL, EOMI, sclera clear, MMM PULM: CTAB w/o wheezes/crackles, normal respiratory effort, on room air CV: RRR w/o M/G/R GI: abd soft, NTND, NABS, no R/G/M MSK: no peripheral edema, muscle strength globally intact 5/5 bilateral upper/lower extremities NEURO: CN II-XII intact, no focal deficits, sensation to light touch intact PSYCH: normal mood/affect Integumentary: No concerning rashes/lesions/wounds noted on exposed skin surfaces    Data Reviewed: I have personally reviewed following labs and imaging studies  CBC: Recent Labs  Lab 01/26/23 2141  WBC 6.4  HGB 14.9  HCT 46.5  MCV 87.4  PLT 386   Basic Metabolic Panel: Recent Labs  Lab 01/26/23 2141 01/27/23 0231 01/27/23 0541  NA 131* 132* 135  K 4.4 3.8 3.8  CL 91* 99 99  CO2 17* 21* 24  GLUCOSE 391* 220* 210*  BUN 25* 22* 20  CREATININE 1.08 0.94 0.82  CALCIUM 8.9 8.6* 8.5*   GFR: Estimated Creatinine Clearance: 99.9 mL/min (by C-G formula based on SCr of 0.82 mg/dL). Liver Function Tests: Recent Labs  Lab 01/26/23 2154  AST 19  ALT 21  ALKPHOS 60  BILITOT 4.1*  PROT 7.6  ALBUMIN 4.1   Recent Labs  Lab 01/26/23 2154  LIPASE 28   No results for input(s): "AMMONIA" in the last 168 hours. Coagulation Profile: No results for input(s): "INR", "PROTIME" in the last 168  hours. Cardiac Enzymes: No results for input(s): "CKTOTAL", "CKMB", "CKMBINDEX", "TROPONINI" in the last 168 hours. BNP (last 3 results) No results for input(s): "PROBNP" in the last 8760 hours. HbA1C: No results for input(s): "HGBA1C" in the last 72 hours. CBG: Recent Labs  Lab 01/27/23 1306 01/27/23 1901 01/27/23 2118 01/28/23 0728 01/28/23 1146  GLUCAP 104* 212* 190* 210* 256*   Lipid Profile: No results for input(s): "CHOL", "HDL", "LDLCALC", "TRIG", "CHOLHDL", "LDLDIRECT" in the last 72 hours. Thyroid Function Tests: No results for input(s): "TSH", "T4TOTAL", "FREET4", "T3FREE", "THYROIDAB" in the last 72 hours. Anemia Panel: No results for input(s): "VITAMINB12", "FOLATE", "FERRITIN", "TIBC", "IRON", "RETICCTPCT" in the last 72 hours. Sepsis Labs: No results for input(s): "PROCALCITON", "LATICACIDVEN" in the last 168 hours.  No results found for this or any previous visit (from the past 240 hour(s)).       Radiology Studies: No results found.      Scheduled Meds:  enoxaparin (LOVENOX) injection  40 mg Subcutaneous Q24H   gabapentin  300 mg  Oral TID   insulin aspart  0-15 Units Subcutaneous TID WC   insulin aspart  0-5 Units Subcutaneous QHS   insulin glargine-yfgn  25 Units Subcutaneous Daily   lidocaine  1 patch Transdermal Q24H   metoCLOPramide (REGLAN) injection  10 mg Intravenous Q8H   pantoprazole (PROTONIX) IV  40 mg Intravenous Q24H   Continuous Infusions:  sodium chloride 75 mL/hr at 01/28/23 0423     LOS: 0 days    Time spent: 52 minutes spent on chart review, discussion with nursing staff, consultants, updating family and interview/physical exam; more than 50% of that time was spent in counseling and/or coordination of care.    [name] [name], DO [facility] Available via Epic secure chat 7am-7pm After these hours, please refer to coverage provider listed on [email or web address] 01/28/2023, 12:27 PM

## 2023-01-29 DIAGNOSIS — E1010 Type 1 diabetes mellitus with ketoacidosis without coma: Secondary | ICD-10-CM | POA: Diagnosis not present

## 2023-01-29 DIAGNOSIS — K3184 Gastroparesis: Secondary | ICD-10-CM | POA: Diagnosis not present

## 2023-01-29 LAB — GLUCOSE, CAPILLARY
Glucose-Capillary: 124 mg/dL — ABNORMAL HIGH (ref 70–99)
Glucose-Capillary: 166 mg/dL — ABNORMAL HIGH (ref 70–99)

## 2023-01-29 LAB — BASIC METABOLIC PANEL
Anion gap: 10 (ref 5–15)
BUN: 8 mg/dL (ref 6–20)
CO2: 22 mmol/L (ref 22–32)
Calcium: 7.8 mg/dL — ABNORMAL LOW (ref 8.9–10.3)
Chloride: 99 mmol/L (ref 98–111)
Creatinine, Ser: 0.48 mg/dL — ABNORMAL LOW (ref 0.61–1.24)
GFR, Estimated: >60 mL/min (ref >60)
Glucose, Bld: 115 mg/dL — ABNORMAL HIGH (ref 70–99)
Potassium: 2.8 mmol/L — ABNORMAL LOW (ref 3.5–5.1)
Sodium: 131 mmol/L — ABNORMAL LOW (ref 135–145)

## 2023-01-29 LAB — HEMOGLOBIN A1C
Hgb A1c MFr Bld: 9.7 % — ABNORMAL HIGH (ref 4.8–5.6)
Mean Plasma Glucose: 231.69 mg/dL

## 2023-01-29 LAB — MAGNESIUM: Magnesium: 1.6 mg/dL — ABNORMAL LOW (ref 1.7–2.4)

## 2023-01-29 MED ORDER — LIDOCAINE 5 % EX PTCH: 1.0000 | MEDICATED_PATCH | CUTANEOUS | 0 refills | Status: DC

## 2023-01-29 MED ORDER — INSULIN GLARGINE 100 UNIT/ML SOLOSTAR PEN: 30.0000 [IU] | PEN_INJECTOR | Freq: Every day | SUBCUTANEOUS | 2 refills | Status: DC

## 2023-01-29 MED ORDER — "PEN NEEDLES 3/16"" 31G X 5 MM MISC": 2 refills | Status: DC

## 2023-01-29 MED ORDER — INSULIN ASPART 100 UNIT/ML FLEXPEN: 10.0000 [IU] | PEN_INJECTOR | Freq: Three times a day (TID) | SUBCUTANEOUS | 2 refills | Status: DC

## 2023-01-29 MED ORDER — POTASSIUM CHLORIDE CRYS ER 20 MEQ PO TBCR
40.0000 meq | EXTENDED_RELEASE_TABLET | ORAL | Status: DC
  Administered 2023-01-29 (×2): 40 meq via ORAL
  Filled 2023-01-29 (×2): qty 2

## 2023-01-29 MED ORDER — METOCLOPRAMIDE HCL 10 MG PO TABS: ORAL_TABLET | ORAL | 1 refills | Status: DC
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
## 2023-01-29 NOTE — Discharge Summary (Signed)
Physician Discharge Summary  Edward Walls ZOX:[id number] DOB: Nov 26, 1980 DOA: 01/26/2023  PCP: Mliss Sax, MD  Admit date: 01/26/2023 Discharge date: 01/29/2023  Admitted From: Home Disposition: Home  Recommendations for Outpatient Follow-up:  Follow up with PCP in 1-2 weeks Refilled home Lantus to utilize 30 units subcu as a daily, and NovoLog 10 units 3 times daily AC as patient states no longer using insulin pump Continue encourage consistent carbohydrate diet given poorly controlled type 1 diabetes mellitus  Home Health: No Equipment/Devices: None  Discharge Condition: Stable CODE STATUS: Full code Diet recommendation: Consistent carb regular diet  History of present illness:  Edward Walls is a 42 y.o. male with past medical history significant for type 1 diabetes mellitus, diabetic gastroparesis, peripheral neuropathy who presented to [facility] ED on 4/13 via EMS from home with high blood sugars over the last 2 days.  Patient has had increased nausea/vomiting over the last 2 days and noticed a dramatic rise in his blood sugar.  He previously was on an insulin pump but since has been on Lantus 30 units daily and short acting insulin 12 units with meals.  Despite use, his blood sugar was reading high on his Dexcom monitor.  On EMS arrival, patient was transported to ED for further evaluation and given IV Zofran.   In the ED, temperature 98.0 F, HR 120, RR 17, BP 124/91, SpO2 96% on room air.  Glucose on arrival was 409.  WBC 6.4, hemoglobin 14.9, platelets 386.  Sodium 131, potassium 4.4, chloride 91, CO2 17, BUN 25, creatinine 1.08.  Anion gap 23.  Urinalysis with greater than 500 glucose, 80 ketones.  Beta hydroxybutyrate acid greater than 8.00.  Patient was given IV fluid bolus, started on insulin drip.  TRH consulted for admission for further evaluation and management of diabetic ketoacidosis  Hospital course:  Diabetic ketoacidosis Type 1 diabetes  mellitus Patient presenting to ED with nausea/vomiting over the last 2 days.  On arrival was noted to have a glucose of 409, anion gap of 23, beta hydroxybutyrate acid greater than 8.00 and 80 ketones on urinalysis.  Patient was initially started on a insulin drip and transition back to subcutaneous insulin following anion gap closure.  Patient reports he no longer utilizes insulin pump and states currently on Lantus 30 units daily, insulin lispro 12 units 3 times daily AC.  Last hemoglobin A1c 10.6 on 09/29/2022.  Hemoglobin A1c 9.7 consistent with poorly controlled type 1 diabetes mellitus.  Will discharge on Lantus 30 units subcu as a daily, NovoLog 10 units 3 times daily AC.  Discussed needs consistent carbohydrate diet to maintain blood sugars adequately.  Outpatient follow-up PCP.   Diabetic gastroparesis Seen by [facility] in the past, Dr. Myrtie Neither; underwent EGD 09/2022 with findings LA grade A esophagitis likely secondary to vomiting.  No other further recommendations and if unable to tolerate oral intake recommended being seen at a tertiary academic center specializing in dysmotility orders and may end up needing surgical placement of J-tube.  Continue Reglan on discharge.  Tolerating diet at time of discharge.   Peripheral neuropathy Continue gabapentin   Low back pain, chronic Lidocaine patch   Severe protein calorie malnutrition Body mass index is 16.25 kg/m.  Etiology likely secondary to his chronic medical condition, poorly controlled diabetes with notable muscle mass/fat wasting/depletion on physical exam.  Needs improvement in regards to diabetic control as leading etiology to his malnutrition.  Discharge Diagnoses:  Principal Problem:   DKA (  diabetic ketoacidosis) Active Problems:   Nausea & vomiting   Diabetes mellitus type 1.5, managed as type 1   Dyslipidemia   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   Hyponatremia   Increased anion gap metabolic  acidosis    Discharge Instructions  Discharge Instructions     Call MD for:  difficulty breathing, headache or visual disturbances   Complete by: As directed    Call MD for:  extreme fatigue   Complete by: As directed    Call MD for:  persistant dizziness or light-headedness   Complete by: As directed    Call MD for:  persistant nausea and vomiting   Complete by: As directed    Call MD for:  severe uncontrolled pain   Complete by: As directed    Call MD for:  temperature >100.4   Complete by: As directed    Diet - low sodium heart healthy   Complete by: As directed    Increase activity slowly   Complete by: As directed    No wound care   Complete by: As directed       Allergies as of 01/29/2023       Reactions   Other Other (See Comments)   All nuts Throat pain   Peanut-containing Drug Products Other (See Comments)   Throat pain        Medication List     STOP taking these medications    amoxicillin-clavulanate 875-125 MG tablet Commonly known as: AUGMENTIN   feeding supplement (GLUCERNA SHAKE) Liqd   insulin lispro 100 UNIT/ML injection Commonly known as: HUMALOG   Lyumjev 100 UNIT/ML Soln Generic drug: Insulin Lispro-aabc   promethazine 25 MG suppository Commonly known as: PHENERGAN   promethazine 25 MG tablet Commonly known as: PHENERGAN       TAKE these medications    Dexcom G6 Transmitter Misc 1 Device by Does not apply route every 3 (three) months.   Dexcom G6 Transmitter Misc 1 Device by Does not apply route every 3 (three) months.   FreeStyle Libre 2 Sensor Misc CHANGE SENSOR EVERY 14 DAYS   Dexcom G6 Sensor Misc Replace every 10 days   gabapentin 300 MG capsule Commonly known as: NEURONTIN Take 2 capsules (600 mg total) by mouth 3 (three) times daily.   insulin aspart 100 UNIT/ML FlexPen Commonly known as: NOVOLOG Inject 10 Units into the skin 3 (three) times daily with meals.   insulin glargine 100 UNIT/ML Solostar  Pen Commonly known as: LANTUS Inject 30 Units into the skin daily.   Insulin Pen Needle 32G X 4 MM Misc Use 4-5x a day What changed: Another medication with the same name was added. Make sure you understand how and when to take each.   Pen Needles 3/16" 31G X 5 MM Misc Use as directed with insulin pen What changed: You were already taking a medication with the same name, and this prescription was added. Make sure you understand how and when to take each.   lidocaine 5 % Commonly known as: LIDODERM Place 1 patch onto the skin daily. Remove & Discard patch within 12 hours or as directed by MD Start taking on: January 30, 2023   metoCLOPramide 10 MG tablet Commonly known as: REGLAN Take 1 tablet (10 mg total) by mouth 3 (three) times daily with meals for 7 days, THEN 1 tablet (10 mg total) every 8 (eight) hours as needed for up to 7 days for nausea. Start taking on: January 29, 2023 What changed: See the new instructions.   Omnipod 5 G6 Intro (Gen 5) Kit 1 each by Does not apply route as needed. What changed: Another medication with the same name was removed. Continue taking this medication, and follow the directions you see here.   ondansetron 4 MG tablet Commonly known as: ZOFRAN Take 1 tablet (4 mg total) by mouth every 6 (six) hours as needed for nausea or vomiting. What changed: Another medication with the same name was removed. Continue taking this medication, and follow the directions you see here.   pantoprazole 40 MG tablet Commonly known as: Protonix Take 1 tablet (40 mg total) by mouth daily.   polyethylene glycol 17 g packet Commonly known as: MIRALAX / GLYCOLAX Take 17 g by mouth daily as needed for moderate constipation.   potassium chloride SA 20 MEQ tablet Commonly known as: KLOR-CON M Take 2 tablets (40 mEq total) by mouth 2 (two) times daily.        Follow-up Information     Mliss Sax, MD. Schedule an appointment as soon as possible for a visit  in 1 week(s).   Specialty: Family Medicine Contact information: [address] [place] [address] [phone]                Allergies  Allergen Reactions   Other Other (See Comments)    All nuts Throat pain   Peanut-Containing Drug Products Other (See Comments)    Throat pain    Consultations: None   Procedures/Studies: No results found.   Subjective: Patient seen examined bedside, resting currently.  Lying in bed.  Tolerating diet.  Feels much better today and ready for discharge home.  As he has discontinued use of his insulin pump, will discharge on Lantus 30 units Semglee daily, NovoLog 10 units 3 times daily AC.  Discussed extensively this morning needs a consistent carb regular diet given his type 1 diabetes in order to ensure his blood sugars remain well-controlled.  No other specific questions or concerns at this time.  Denies headache, no dizziness, no chest pain, no visual changes, no palpitations, no shortness of breath, no abdominal pain, no fever/chills/night sweats, no nausea/vomiting/diarrhea, no focal weakness, no fatigue, no paresthesias.  No acute events overnight per nursing staff.  Discharge Exam: Vitals:   01/28/23 1930 01/29/23 0456  BP: (!) 140/88 (!) 118/96  Pulse: (!) 109 74  Resp: 18 18  Temp: 99.1 F (37.3 C) 98.8 F (37.1 C)  SpO2: 100% 100%   Vitals:   01/28/23 0611 01/28/23 1343 01/28/23 1930 01/29/23 0456  BP: 133/86 114/81 (!) 140/88 (!) 118/96  Pulse: 99 (!) 106 (!) 109 74  Resp: 17 14 18 18   Temp: 98.6 F (37 C) 98.6 F (37 C) 99.1 F (37.3 C) 98.8 F (37.1 C)  TempSrc:   Oral Oral  SpO2: 100% 100% 100% 100%  Weight:      Height:        Physical Exam: GEN: NAD, alert and oriented x 3, thin in appearance HEENT: NCAT, PERRL, EOMI, sclera clear, MMM PULM: CTAB w/o wheezes/crackles, normal respiratory effort, on room air CV: RRR w/o M/G/R GI: abd soft, NTND, NABS, no R/G/M MSK: no peripheral edema, muscle  strength globally intact 5/5 bilateral upper/lower extremities NEURO: CN II-XII intact, no focal deficits, sensation to light touch intact PSYCH: normal mood/affect Integumentary: No concerning rashes/lesions/wounds noted on exposed skin surfaces    The results of significant diagnostics from this hospitalization (including imaging,  microbiology, ancillary and laboratory) are listed below for reference.     Microbiology: No results found for this or any previous visit (from the past 240 hour(s)).   Labs: BNP (last 3 results) No results for input(s): "BNP" in the last 8760 hours. Basic Metabolic Panel: Recent Labs  Lab 01/26/23 2141 01/27/23 0231 01/27/23 0541 01/29/23 0536  NA 131* 132* 135 131*  K 4.4 3.8 3.8 2.8*  CL 91* 99 99 99  CO2 17* 21* 24 22  GLUCOSE 391* 220* 210* 115*  BUN 25* 22* 20 8  CREATININE 1.08 0.94 0.82 0.48*  CALCIUM 8.9 8.6* 8.5* 7.8*  MG  --   --   --  1.6*   Liver Function Tests: Recent Labs  Lab 01/26/23 2154  AST 19  ALT 21  ALKPHOS 60  BILITOT 4.1*  PROT 7.6  ALBUMIN 4.1   Recent Labs  Lab 01/26/23 2154  LIPASE 28   No results for input(s): "AMMONIA" in the last 168 hours. CBC: Recent Labs  Lab 01/26/23 2141  WBC 6.4  HGB 14.9  HCT 46.5  MCV 87.4  PLT 386   Cardiac Enzymes: No results for input(s): "CKTOTAL", "CKMB", "CKMBINDEX", "TROPONINI" in the last 168 hours. BNP: Invalid input(s): "POCBNP" CBG: Recent Labs  Lab 01/28/23 0728 01/28/23 1146 01/28/23 1649 01/28/23 2140 01/29/23 0716  GLUCAP 210* 256* 150* 207* 124*   D-Dimer No results for input(s): "DDIMER" in the last 72 hours. Hgb A1c Recent Labs    01/29/23 0354  HGBA1C 9.7*   Lipid Profile No results for input(s): "CHOL", "HDL", "LDLCALC", "TRIG", "CHOLHDL", "LDLDIRECT" in the last 72 hours. Thyroid function studies No results for input(s): "TSH", "T4TOTAL", "T3FREE", "THYROIDAB" in the last 72 hours.  Invalid input(s): "FREET3" Anemia work  up No results for input(s): "VITAMINB12", "FOLATE", "FERRITIN", "TIBC", "IRON", "RETICCTPCT" in the last 72 hours. Urinalysis    Component Value Date/Time   COLORURINE YELLOW 01/26/2023 0214   APPEARANCEUR CLEAR 01/26/2023 0214   LABSPEC 1.026 01/26/2023 0214   PHURINE 5.0 01/26/2023 0214   GLUCOSEU >=500 (A) 01/26/2023 0214   GLUCOSEU >=1000 (A) 05/19/2021 1046   HGBUR NEGATIVE 01/26/2023 0214   BILIRUBINUR NEGATIVE 01/26/2023 0214   KETONESUR 80 (A) 01/26/2023 0214   PROTEINUR 30 (A) 01/26/2023 0214   UROBILINOGEN 0.2 05/19/2021 1046   NITRITE NEGATIVE 01/26/2023 0214   LEUKOCYTESUR NEGATIVE 01/26/2023 0214   Sepsis Labs Recent Labs  Lab 01/26/23 2141  WBC 6.4   Microbiology No results found for this or any previous visit (from the past 240 hour(s)).   Time coordinating discharge: Over 30 minutes  SIGNED:   [name] [name], DO  [facility] 01/29/2023, 11:21 AM

## 2023-01-30 ENCOUNTER — Telehealth: Payer: Self-pay

## 2023-01-30 NOTE — Transitions of Care (Post Inpatient/ED Visit) (Signed)
   01/30/2023  Name: Edward Walls MRN: [id number] DOB: [birth year]  Today's TOC FU Call Status: Today's TOC FU Call Status:: Successful TOC FU Call Competed TOC FU Call Complete Date: 01/30/23  Transition Care Management Follow-up Telephone Call Date of Discharge: 01/29/23 Discharge Facility: [facility]) Type of Discharge: Inpatient Admission Primary Inpatient Discharge Diagnosis:: Diabetic Ketoacidosis How have you been since you were released from the hospital?: Same Any questions or concerns?: No  Items Reviewed: Did you receive and understand the discharge instructions provided?: Yes Medications obtained and verified?: No Medications Not Reviewed Reasons:: Other: (Patient states that he plans to get today) Any new allergies since your discharge?: No Dietary orders reviewed?: NA Do you have support at home?: Yes People in Home: parent(s), other relative(s)  Home Care and Equipment/Supplies: Were Home Health Services Ordered?: No Any new equipment or medical supplies ordered?: No  Functional Questionnaire: Do you need assistance with bathing/showering or dressing?: No Do you need assistance with meal preparation?: No Do you need assistance with eating?: No Do you have difficulty maintaining continence: No Do you need assistance with getting out of bed/getting out of a chair/moving?: No Do you have difficulty managing or taking your medications?: No  Follow up appointments reviewed: PCP Follow-up appointment confirmed?: No (declined appt with pcp) Specialist Hospital Follow-up appointment confirmed?: Yes Date of Specialist follow-up appointment?: 02/07/23 Follow-Up Specialty Provider:: Lafe Garin Do you need transportation to your follow-up appointment?: No Do you understand care options if your condition(s) worsen?: Yes-patient verbalized understanding    SIGNATURE Kandis Fantasia, LPN [facility] Health Advisor [facility] You Are. We Are.  [employer] # [phone]

## 2023-02-08 ENCOUNTER — Ambulatory Visit: Payer: Managed Care, Other (non HMO) | Admitting: Internal Medicine

## 2023-02-08 ENCOUNTER — Encounter: Payer: Self-pay | Admitting: Internal Medicine

## 2023-02-08 ENCOUNTER — Encounter (HOSPITAL_BASED_OUTPATIENT_CLINIC_OR_DEPARTMENT_OTHER): Payer: Managed Care, Other (non HMO) | Admitting: Internal Medicine

## 2023-02-08 VITALS — BP 120/80 | HR 116 | Ht 75.0 in | Wt 157.8 lb

## 2023-02-08 DIAGNOSIS — E1010 Type 1 diabetes mellitus with ketoacidosis without coma: Secondary | ICD-10-CM | POA: Diagnosis not present

## 2023-02-08 DIAGNOSIS — E785 Hyperlipidemia, unspecified: Secondary | ICD-10-CM | POA: Diagnosis not present

## 2023-02-08 NOTE — Patient Instructions (Addendum)
Please switch back to the pump: - Basal rates: 12 am: 1.35 units/h - Insulin to carb ratio: 12 am: 1 - Target: 12 am: 140 - Correction factor (insulin sensitivity factor):  12 am: 28 - Active insulin time: 4h  Enter between 6-12 g carbs in the pump and start the boluses right before meals.  Please return in 2 months.

## 2023-02-08 NOTE — Progress Notes (Signed)
Patient ID: Edward Walls, male   DOB: 1980/11/25, 42 y.o.   MRN: [id number]    HPI: Edward Walls is a 42 y.o.-year-old male, returning for follow-up for DM1, dx in 2017, insulin-dependent, uncontrolled, with complications (recurrent DKA episodes).  Last visit 4 months ago.  Interim history: No increased urination, blurry vision, nausea, chest pain.  He continues to have nausea and vomiting.  He was admitted 10/25/2022 for the same symptoms and suspicion of gastroparesis.  Of note, an abdominal CT scan and an EGD were normal.  He was admitted again 01/26/2023 for the same symptoms in DKA.  A CT scan of the abdomen was again normal. He was started on Reglan. He came off the pump 1 week ago b/c of the alarms.   Reviewed HbA1c levels: Lab Results  Component Value Date   HGBA1C 9.7 (H) 01/29/2023   HGBA1C 10.6 (H) 09/29/2022   HGBA1C 11.0 (A) 07/28/2022   HGBA1C >15.5 (H) 05/25/2022   HGBA1C >15 02/20/2022   HGBA1C >15 09/29/2021   HGBA1C 12.2 (A) 07/01/2021   HGBA1C 13.7 (A) 11/24/2020   HGBA1C >15.0 09/24/2020   HGBA1C >15.5 (H) 06/04/2020  07/05/2017: GAD Ab <5  Previously on:  - Tresiba 40 units daily (in the abdomen) >> 34 units daily >> OFF for 1 month - did not refill it!!! - Lyumjev 6-10 units >> 10-16 units, depending on the size of the meal (in the abdomen, thighs or upper arms) >> 18 units  - misses it seldom  At last visit was on: - Lantus 34 units daily  - Lyumjev 16-18 >> 8-12 units, depending on the size of the meal (in the abdomen, thighs or upper arms) - Lyumjev sliding scale: - 150-175: + 1 unit  - 176-200: + 2 units  - 201-225: + 3 units  - 226-250: + 4 units  - 251-275: + 5 units - 276-300: + 6 units >300: + 7 units  He started on OmniPod 5 insulin pump -Humalog in the pump: - Basal rates: 12 am: 1.25 >> 1.35 units/h - Insulin to carb ratio: 12 am: 1 - Target: 12 am: 140 - Correction factor (insulin sensitivity factor):  12 am: 28 - Active insulin  time: 4h - Changes infusion site: q3 days  Total daily dose from basal insulin: 79% Total daily dose from bolus insulin: 21% Total daily dose 17.5-30 units Number of boluses a day: 0.7! Stayed in the auto mode 91% of the time  Now off the pump x1 week: - Lantus 30 >> 34 units daily (increased yesterday) at bedtime (in am) - Lyumjev 10 units before meals  He was previously on the freestyle libre CGM >> Dexcom G6 -no more irritation at the site after he learned to attach it correctly - however, we could not download the reports today as he was not able to login to Clarity app and he is off the pump: - am: 220 (before going to bed) - 2h after b'fast: ? - lunch: 180-190 - 2h after lunch: ? - dinner: 200 - 2h after dinner: 200 - bedtime: see above  Previously:  Previously:   Previously  Previously:   Lowest sugar was  320 >> 50s >> 90 >> 70; he has hypoglycemia awareness in the 80s. Highest sugar was HI >> HI >> 300s >> 409 at admission 01/2023. He had 1 previous DKA episode when he could not afford his insulin in the past.  Pt's meals are: - Breakfast: oatmeal + banana -  Lunch: meatloaf, chilli, salad - Dinner: meat + veggies - Snacks: 8 pm  - crackers, at work -he works nights  No CKD, last BUN/creatinine:  Lab Results  Component Value Date   BUN 8 01/29/2023   BUN 20 01/27/2023   CREATININE 0.48 (L) 01/29/2023   CREATININE 0.82 01/27/2023   He has dyslipidemia: Lab Results  Component Value Date   CHOL 85 (L) 07/28/2022   HDL 39 (L) 07/28/2022   LDLCALC 37 07/28/2022   TRIG 26 07/28/2022   CHOLHDL 2.2 07/28/2022   -No previous eye exams. I recommended that he sees Dr. Randon Goldsmith and gave him his information. He did not see him yet.    -He denies numbness and tingling in his feet.  Last foot exam was in 07/2022.  Pt has FH of DM in M, F, all 3 of his brothers - all have DM2.  ROS: + See HPI  I reviewed pt's medications, allergies, PMH, social hx, family hx,  and changes were documented in the history of present illness. Otherwise, unchanged from my initial visit note.  Past Medical History:  Diagnosis Date   Diabetes mellitus without complication    Leg pain    Leg weakness    Seasonal allergies    Past Surgical History:  Procedure Laterality Date   DENTAL SURGERY     ESOPHAGOGASTRODUODENOSCOPY (EGD) WITH PROPOFOL N/A 10/27/2022   Procedure: ESOPHAGOGASTRODUODENOSCOPY (EGD) WITH PROPOFOL;  Surgeon: Sherrilyn Rist, MD;  Location: [facility];  Service: Gastroenterology;  Laterality: N/A;   Social History   Socioeconomic History   Marital status: Single    Spouse name: Not on file   Number of children:  To: 36 and 17 10/2008   Years of education: Not on file   Highest education level: Not on file  Occupational History    Distribution receiver for [employer]  Social Needs   Financial resource strain: Not on file   Food insecurity:    Worry: Not on file    Inability: Not on file   Transportation needs:    Medical: Not on file    Non-medical: Not on file  Tobacco Use   Smoking status: Former Smoker    Types: Cigarettes   Smokeless tobacco: Never Used  Substance and Sexual Activity   Alcohol use:  no    Alcohol/week:   Types:  Drug use: Not Currently   Sexual activity: Not on file  Lifestyle   Physical activity:    Days per week: Not on file    Minutes per session: Not on file   Stress: Not on file  Relationships   Social connections:    Talks on phone: Not on file    Gets together: Not on file    Attends religious service: Not on file    Active member of club or organization: Not on file    Attends meetings of clubs or organizations: Not on file    Relationship status: Not on file   Intimate partner violence:    Fear of current or ex partner: Not on file    Emotionally abused: Not on file    Physically abused: Not on file    Forced sexual activity: Not on file  Other Topics Concern   Not on file  Social  History Narrative   Not on file   Current Outpatient Medications on File Prior to Visit  Medication Sig Dispense Refill   Continuous Blood Gluc Sensor (DEXCOM G6 SENSOR) MISC Replace every  10 days 9 each 3   Continuous Blood Gluc Sensor (FREESTYLE LIBRE 2 SENSOR) MISC CHANGE SENSOR EVERY 14 DAYS 6 each 2   Continuous Blood Gluc Transmit (DEXCOM G6 TRANSMITTER) MISC 1 Device by Does not apply route every 3 (three) months. 1 each 3   Continuous Blood Gluc Transmit (DEXCOM G6 TRANSMITTER) MISC 1 Device by Does not apply route every 3 (three) months. 1 each 3   gabapentin (NEURONTIN) 300 MG capsule Take 2 capsules (600 mg total) by mouth 3 (three) times daily. (Patient not taking: Reported on 01/27/2023) 180 capsule 11   insulin aspart (NOVOLOG) 100 UNIT/ML FlexPen Inject 10 Units into the skin 3 (three) times daily with meals. 15 mL 2   Insulin Disposable Pump (OMNIPOD 5 G6 INTRO, GEN 5,) KIT 1 each by Does not apply route as needed. 1 kit 0   insulin glargine (LANTUS) 100 UNIT/ML Solostar Pen Inject 30 Units into the skin daily. 9 mL 2   Insulin Pen Needle (PEN NEEDLES 3/16") 31G X 5 MM MISC Use as directed with insulin pen 100 each 2   Insulin Pen Needle 32G X 4 MM MISC Use 4-5x a day 300 each 3   lidocaine (LIDODERM) 5 % Place 1 patch onto the skin daily. Remove & Discard patch within 12 hours or as directed by MD 30 patch 0   metoCLOPramide (REGLAN) 10 MG tablet Take 1 tablet (10 mg total) by mouth 3 (three) times daily with meals for 7 days, THEN 1 tablet (10 mg total) every 8 (eight) hours as needed for up to 7 days for nausea. 60 tablet 1   ondansetron (ZOFRAN) 4 MG tablet Take 1 tablet (4 mg total) by mouth every 6 (six) hours as needed for nausea or vomiting. 20 tablet 0   pantoprazole (PROTONIX) 40 MG tablet Take 1 tablet (40 mg total) by mouth daily. (Patient not taking: Reported on 01/27/2023) 30 tablet 11   polyethylene glycol (MIRALAX / GLYCOLAX) 17 g packet Take 17 g by mouth daily as  needed for moderate constipation. 14 each 0   potassium chloride SA (KLOR-CON M) 20 MEQ tablet Take 2 tablets (40 mEq total) by mouth 2 (two) times daily. 10 tablet 0   No current facility-administered medications on file prior to visit.   Allergies  Allergen Reactions   Other Other (See Comments)    All nuts Throat pain   Peanut-Containing Drug Products Other (See Comments)    Throat pain   Family History  Problem Relation Age of Onset   Diabetes Mellitus II Mother    Diabetes Mellitus II Father    Diabetes Mellitus II Brother    PE: There were no vitals taken for this visit. Wt Readings from Last 3 Encounters:  01/26/23 130 lb (59 kg)  01/03/23 144 lb 12.8 oz (65.7 kg)  12/29/22 130 lb (59 kg)        Constitutional: normal weight, in NAD Eyes:  EOMI, no exophthalmos ENT: no neck masses, no cervical lymphadenopathy Cardiovascular: tachycardia, RR, No MRG Respiratory: CTA B Musculoskeletal: no deformities Skin:no rashes Neurological: + tremor with outstretched hands  ASSESSMENT: 1. DM1, insulin-dependent, uncontrolled, without long-term complications, but with DKA episodes  Component     Latest Ref Rng & Units 12/12/2018  Glucose, Plasma     65 - 99 mg/dL 161 (H)  C-Peptide     0.80 - 3.85 ng/mL 0.52 (L)  Islet Cell Ab     Neg:<1:1 Negative  ZNT8 Antibodies  U/mL <15   2.  Dyslipidemia  PLAN:  1. Patient with longstanding, very uncontrolled type 1 diabetes, with history of repeated DKA episodes for various reasons including not affording insulin in the past, putting Lantus into the pump instead of rapid acting insulin, or nausea and vomiting.  Latest DKA admission was 04/12-15/24 and it was due to nausea and vomiting.  He was given Reglan and he feels better now.  During this admission, his HbA1c was 9.7%, improved from 10.6%, but previously even higher, >15.5%. -At last visit, reviewing his CGM trends, sugars were dramatically improved from before, after  starting the OmniPod 5 pump.  They were at or slightly above the upper limit of the target range, with more pronounced spikes during the night and then after approximately 2 PM.  We increase his basal rates but we did not change his bolus settings as he was not doing any boluses.  I strongly advised him to enter carbs and bolus before every meal.  Due to the suspected gastroparesis, I advised him to inject Humalog at the start of the meal rather than 15 minutes before. -At today's visit, unfortunately we could not download his CGM as he was not able to log into the Clarity app.  Reportedly, sugars appear to be still elevated.  He is on basal/bolus insulin regimen for now and we discussed about going back to the pump as soon as possible.  Discussed about the sites that he can attach the pump and the fact that the sensor has to be in direct medication with the pump on the same site of the body.  I did not suggest a change in pump settings but we did discuss about varying the dose of insulin that he injects before meals. - I suggested to:  Patient Instructions  Please use the following pump settings: - Basal rates: 12 am: 1.35 units/h - Insulin to carb ratio: 12 am: 1 - Target: 12 am: 140 - Correction factor (insulin sensitivity factor):  12 am: 28 - Active insulin time: 4h  Enter between 6-12 g carbs in the pump and start the boluses right before meals.  Please return in 2  months.    - advised to check sugars at different times of the day - 4x a day, rotating check times - advised for yearly eye exams >> he is not UTD but plans to schedule an appointment - return to clinic in 2 months  2.  Dyslipidemia -Reviewed latest lipid panel from 07/2022: Low HDL, otherwise fractions at goal: Lab Results  Component Value Date   CHOL 85 (L) 07/28/2022   HDL 39 (L) 07/28/2022   LDLCALC 37 07/28/2022   TRIG 26 07/28/2022   CHOLHDL 2.2 07/28/2022  -He is not on a statin  Carlus Pavlov, MD  PhD [facility]

## 2023-02-18 ENCOUNTER — Emergency Department (HOSPITAL_COMMUNITY)
Admission: EM | Admit: 2023-02-18 | Discharge: 2023-02-18 | Disposition: A | Discharge status: Home / Self Care | Payer: Managed Care, Other (non HMO) | Attending: Emergency Medicine | Admitting: Emergency Medicine

## 2023-02-18 ENCOUNTER — Encounter (HOSPITAL_COMMUNITY): Payer: Self-pay

## 2023-02-18 ENCOUNTER — Other Ambulatory Visit: Payer: Self-pay

## 2023-02-18 DIAGNOSIS — Z91010 Allergy to peanuts: Secondary | ICD-10-CM | POA: Insufficient documentation

## 2023-02-18 DIAGNOSIS — E1065 Type 1 diabetes mellitus with hyperglycemia: Secondary | ICD-10-CM | POA: Diagnosis not present

## 2023-02-18 DIAGNOSIS — R1110 Vomiting, unspecified: Secondary | ICD-10-CM

## 2023-02-18 DIAGNOSIS — Z794 Long term (current) use of insulin: Secondary | ICD-10-CM | POA: Diagnosis not present

## 2023-02-18 LAB — COMPREHENSIVE METABOLIC PANEL
ALT: 17 U/L (ref 0–44)
AST: 13 U/L — ABNORMAL LOW (ref 15–41)
Albumin: 3.9 g/dL (ref 3.5–5.0)
Alkaline Phosphatase: 42 U/L (ref 38–126)
Anion gap: 8 (ref 5–15)
BUN: 19 mg/dL (ref 6–20)
CO2: 28 mmol/L (ref 22–32)
Calcium: 8.7 mg/dL — ABNORMAL LOW (ref 8.9–10.3)
Chloride: 99 mmol/L (ref 98–111)
Creatinine, Ser: 0.71 mg/dL (ref 0.61–1.24)
GFR, Estimated: >60 mL/min (ref >60)
Glucose, Bld: 205 mg/dL — ABNORMAL HIGH (ref 70–99)
Potassium: 3.8 mmol/L (ref 3.5–5.1)
Sodium: 135 mmol/L (ref 135–145)
Total Bilirubin: 1.5 mg/dL — ABNORMAL HIGH (ref 0.3–1.2)
Total Protein: 6.7 g/dL (ref 6.5–8.1)

## 2023-02-18 LAB — BLOOD GAS, VENOUS
Acid-Base Excess: 8.2 mmol/L — ABNORMAL HIGH (ref 0.0–2.0)
Bicarbonate: 34.4 mmol/L — ABNORMAL HIGH (ref 20.0–28.0)
O2 Saturation: 52 %
Patient temperature: 36.7
pCO2, Ven: 52 mmHg (ref 44–60)
pH, Ven: 7.42 (ref 7.25–7.43)
pO2, Ven: <31 mmHg — CL (ref 32–45)

## 2023-02-18 LAB — CBC WITH DIFFERENTIAL/PLATELET
Abs Immature Granulocytes: 0.02 10*3/uL (ref 0.00–0.07)
Basophils Absolute: 0 10*3/uL (ref 0.0–0.1)
Basophils Relative: 0 %
Eosinophils Absolute: 0 10*3/uL (ref 0.0–0.5)
Eosinophils Relative: 0 %
HCT: 38.9 % — ABNORMAL LOW (ref 39.0–52.0)
Hemoglobin: 12.4 g/dL — ABNORMAL LOW (ref 13.0–17.0)
Immature Granulocytes: 0 %
Lymphocytes Relative: 19 %
Lymphs Abs: 1.3 10*3/uL (ref 0.7–4.0)
MCH: 27.9 pg (ref 26.0–34.0)
MCHC: 31.9 g/dL (ref 30.0–36.0)
MCV: 87.4 fL (ref 80.0–100.0)
Monocytes Absolute: 0.6 10*3/uL (ref 0.1–1.0)
Monocytes Relative: 8 %
Neutro Abs: 4.9 10*3/uL (ref 1.7–7.7)
Neutrophils Relative %: 73 %
Platelets: 249 10*3/uL (ref 150–400)
RBC: 4.45 MIL/uL (ref 4.22–5.81)
RDW: 13.1 % (ref 11.5–15.5)
WBC: 6.8 10*3/uL (ref 4.0–10.5)
nRBC: 0 % (ref 0.0–0.2)

## 2023-02-18 LAB — LIPASE, BLOOD: Lipase: 31 U/L (ref 11–51)

## 2023-02-18 LAB — CBG MONITORING, ED: Glucose-Capillary: 158 mg/dL — ABNORMAL HIGH (ref 70–99)

## 2023-02-18 MED ORDER — DROPERIDOL 2.5 MG/ML IJ SOLN
1.2500 mg | Freq: Once | INTRAMUSCULAR | Status: AC
  Administered 2023-02-18: 1.25 mg via INTRAVENOUS
  Filled 2023-02-18: qty 2

## 2023-02-18 MED ORDER — LORAZEPAM 2 MG/ML IJ SOLN
0.5000 mg | Freq: Once | INTRAMUSCULAR | Status: AC
  Administered 2023-02-18: 0.5 mg via INTRAVENOUS
  Filled 2023-02-18: qty 1

## 2023-02-18 MED ORDER — LACTATED RINGERS IV SOLN: INTRAVENOUS | Status: DC

## 2023-02-18 MED ORDER — DIPHENHYDRAMINE HCL 50 MG/ML IJ SOLN
12.5000 mg | Freq: Once | INTRAMUSCULAR | Status: AC
  Administered 2023-02-18: 12.5 mg via INTRAVENOUS
  Filled 2023-02-18: qty 1

## 2023-02-18 MED ORDER — ONDANSETRON 4 MG PO TBDP
ORAL_TABLET | ORAL | Status: AC
  Filled 2023-02-18: qty 1

## 2023-02-18 MED ORDER — LACTATED RINGERS IV BOLUS
2000.0000 mL | Freq: Once | INTRAVENOUS | Status: AC
  Administered 2023-02-18: 2000 mL via INTRAVENOUS

## 2023-02-18 MED ORDER — FENTANYL CITRATE PF 50 MCG/ML IJ SOSY
50.0000 ug | PREFILLED_SYRINGE | Freq: Once | INTRAMUSCULAR | Status: AC
  Administered 2023-02-18: 50 ug via INTRAVENOUS
  Filled 2023-02-18: qty 1

## 2023-02-18 MED ORDER — METOCLOPRAMIDE HCL 5 MG/ML IJ SOLN
10.0000 mg | Freq: Once | INTRAMUSCULAR | Status: AC
  Administered 2023-02-18: 10 mg via INTRAVENOUS
  Filled 2023-02-18: qty 2

## 2023-02-18 NOTE — ED Notes (Signed)
Provided pt with a urinal 

## 2023-02-18 NOTE — ED Provider Notes (Signed)
[place] EMERGENCY DEPARTMENT AT [facility] Provider Note   CSN: [id number] Arrival date & time: 02/18/23  1700     History  Chief Complaint  Patient presents with   Emesis   Hyperglycemia    Edward Walls is a 42 y.o. male.  42 year old male with history of diabetes type 1 who presents with several days of nonbilious emesis with diffuse abdominal pain.  Seen for similar symptoms few weeks ago.  States has been using Zofran without relief.  Did have some palpitations which have since resolved.  No anginal type symptoms.  Denies any fever or diarrhea.  His emesis has been nonbilious or bloody.  Unsure of what caused this       Home Medications Prior to Admission medications   Medication Sig Start Date End Date Taking? Authorizing Provider  Continuous Blood Gluc Sensor (DEXCOM G6 SENSOR) MISC Replace every 10 days 08/11/22   Carlus Pavlov, MD  Continuous Blood Gluc Sensor (FREESTYLE LIBRE 2 SENSOR) MISC CHANGE SENSOR EVERY 14 DAYS 08/04/22   Mliss Sax, MD  Continuous Blood Gluc Transmit (DEXCOM G6 TRANSMITTER) MISC 1 Device by Does not apply route every 3 (three) months. 02/20/22   Carlus Pavlov, MD  Continuous Blood Gluc Transmit (DEXCOM G6 TRANSMITTER) MISC 1 Device by Does not apply route every 3 (three) months. 02/20/22   Carlus Pavlov, MD  gabapentin (NEURONTIN) 300 MG capsule Take 2 capsules (600 mg total) by mouth 3 (three) times daily. Patient not taking: Reported on 01/27/2023 06/27/22   Levert Feinstein, MD  insulin aspart (NOVOLOG) 100 UNIT/ML FlexPen Inject 10 Units into the skin 3 (three) times daily with meals. 01/29/23 04/29/23  [name], [name], DO  Insulin Disposable Pump (OMNIPOD 5 G6 INTRO, GEN 5,) KIT 1 each by Does not apply route as needed. 02/20/22   Carlus Pavlov, MD  insulin glargine (LANTUS) 100 UNIT/ML Solostar Pen Inject 30 Units into the skin daily. 01/29/23 04/29/23  [name], [name], DO  Insulin Pen Needle (PEN NEEDLES 3/16")  31G X 5 MM MISC Use as directed with insulin pen 01/29/23   [name], [name], DO  Insulin Pen Needle 32G X 4 MM MISC Use 4-5x a day 05/26/22   Marguerita Merles Latif, DO  lidocaine (LIDODERM) 5 % Place 1 patch onto the skin daily. Remove & Discard patch within 12 hours or as directed by MD 01/30/23   [name], [name], DO  metoCLOPramide (REGLAN) 10 MG tablet Take 1 tablet (10 mg total) by mouth 3 (three) times daily with meals for 7 days, THEN 1 tablet (10 mg total) every 8 (eight) hours as needed for up to 7 days for nausea. 01/29/23 02/12/23  [name], [name], DO  ondansetron (ZOFRAN) 4 MG tablet Take 1 tablet (4 mg total) by mouth every 6 (six) hours as needed for nausea or vomiting. 12/29/22   [name], MD  pantoprazole (PROTONIX) 40 MG tablet Take 1 tablet (40 mg total) by mouth daily. Patient not taking: Reported on 01/27/2023 10/28/22 10/28/23  Kathlen Mody, MD  polyethylene glycol (MIRALAX / GLYCOLAX) 17 g packet Take 17 g by mouth daily as needed for moderate constipation. 05/26/22   Marguerita Merles Latif, DO  potassium chloride SA (KLOR-CON M) 20 MEQ tablet Take 2 tablets (40 mEq total) by mouth 2 (two) times daily. 12/29/22   [name], MD      Allergies    Other and Peanut-containing drug products    Review of Systems  Review of Systems  All other systems reviewed and are negative.   Physical Exam Updated Vital Signs BP 124/86 (BP Location: Right Arm)   Pulse 79   Temp 99 F (37.2 C) (Oral)   Resp 18   Ht 1.905 m (6\' 3" )   Wt 68 kg   SpO2 98%   BMI 18.75 kg/m  Physical Exam Vitals and nursing note reviewed.  Constitutional:      General: He is not in acute distress.    Appearance: Normal appearance. He is well-developed. He is not toxic-appearing.  HENT:     Head: Normocephalic and atraumatic.  Eyes:     General: Lids are normal.     Conjunctiva/sclera: Conjunctivae normal.     Pupils: Pupils are equal, round, and reactive to light.  Neck:     Thyroid: No  thyroid mass.     Trachea: No tracheal deviation.  Cardiovascular:     Rate and Rhythm: Normal rate and regular rhythm.     Heart sounds: Normal heart sounds. No murmur heard.    No gallop.  Pulmonary:     Effort: Pulmonary effort is normal. No respiratory distress.     Breath sounds: Normal breath sounds. No stridor. No decreased breath sounds, wheezing, rhonchi or rales.  Abdominal:     General: There is no distension.     Palpations: Abdomen is soft.     Tenderness: There is generalized abdominal tenderness. There is no rebound.  Musculoskeletal:        General: No tenderness. Normal range of motion.     Cervical back: Normal range of motion and neck supple.  Skin:    General: Skin is warm and dry.     Findings: No abrasion or rash.  Neurological:     Mental Status: He is alert and oriented to person, place, and time. Mental status is at baseline.     GCS: GCS eye subscore is 4. GCS verbal subscore is 5. GCS motor subscore is 6.     Cranial Nerves: No cranial nerve deficit.     Sensory: No sensory deficit.     Motor: Motor function is intact.  Psychiatric:        Attention and Perception: Attention normal.        Speech: Speech normal.        Behavior: Behavior normal.     ED Results / Procedures / Treatments   Labs (all labs ordered are listed, but only abnormal results are displayed) Labs Reviewed  CBG MONITORING, ED - Abnormal; Notable for the following components:      Result Value   Glucose-Capillary 158 (*)    All other components within normal limits  CBC WITH DIFFERENTIAL/PLATELET  URINALYSIS, ROUTINE W REFLEX MICROSCOPIC  LIPASE, BLOOD  COMPREHENSIVE METABOLIC PANEL  BLOOD GAS, VENOUS    EKG None  Radiology No results found.  Procedures Procedures    Medications Ordered in ED Medications  lactated ringers bolus 2,000 mL (has no administration in time range)  lactated ringers infusion (has no administration in time range)  metoCLOPramide  (REGLAN) injection 10 mg (has no administration in time range)  diphenhydrAMINE (BENADRYL) injection 12.5 mg (has no administration in time range)  fentaNYL (SUBLIMAZE) injection 50 mcg (has no administration in time range)    ED Course/ Medical Decision Making/ A&P  Medical Decision Making Amount and/or Complexity of Data Reviewed Labs: ordered.  Risk Prescription drug management.   Patient treatment antiemetics and IV fluids and feels better at this time.  Given p.o. challenge and was eventually able to take liquids.  His abdomen remains benign.  He has no evidence of DKA based on his electrolytes.  No leukocytosis on CBC.  Do not feel needs any intra-abdominal region.  Will discharge home        Final Clinical Impression(s) / ED Diagnoses Final diagnoses:  None    Rx / DC Orders ED Discharge Orders     None         Lorre Nick, MD 02/18/23 2208

## 2023-02-18 NOTE — ED Triage Notes (Signed)
Patient arrived from home, reports elevated BG for the last two days, nausea and vomiting and abdominal pain.  Patient was seen for similar sx a few weeks ago.  Patient also reports that while in transit with EMS he experienced palpitations and CP, he states that those sx have improved.  EMS cleared with cardiology before arriving based on 12-lead.

## 2023-02-19 ENCOUNTER — Telehealth: Payer: Self-pay

## 2023-02-19 ENCOUNTER — Ambulatory Visit: Payer: Managed Care, Other (non HMO) | Admitting: Cardiology

## 2023-02-19 NOTE — Transitions of Care (Post Inpatient/ED Visit) (Signed)
   02/19/2023  Name: Edward Walls MRN: [id number] DOB: 05/02/1981  Today's TOC FU Call Status: Today's TOC FU Call Status:: Unsuccessul Call (1st Attempt) Unsuccessful Call (1st Attempt) Date: 02/19/23  Attempted to reach the patient regarding the most recent Inpatient/ED visit.  Follow Up Plan: Additional outreach attempts will be made to reach the patient to complete the Transitions of Care (Post Inpatient/ED visit) call.   Signature Arvil Persons, BSN, Charity fundraiser

## 2023-02-20 ENCOUNTER — Encounter: Payer: Self-pay | Admitting: Family Medicine

## 2023-02-21 ENCOUNTER — Ambulatory Visit: Payer: Managed Care, Other (non HMO) | Admitting: Family Medicine

## 2023-02-21 ENCOUNTER — Encounter: Payer: Self-pay | Admitting: Family Medicine

## 2023-02-21 VITALS — BP 122/72 | HR 108 | Temp 98.1°F | Ht 75.0 in | Wt 145.0 lb

## 2023-02-21 DIAGNOSIS — R112 Nausea with vomiting, unspecified: Secondary | ICD-10-CM

## 2023-02-21 DIAGNOSIS — E1043 Type 1 diabetes mellitus with diabetic autonomic (poly)neuropathy: Secondary | ICD-10-CM | POA: Diagnosis not present

## 2023-02-21 DIAGNOSIS — F418 Other specified anxiety disorders: Secondary | ICD-10-CM | POA: Diagnosis not present

## 2023-02-21 DIAGNOSIS — K3184 Gastroparesis: Secondary | ICD-10-CM

## 2023-02-21 DIAGNOSIS — R634 Abnormal weight loss: Secondary | ICD-10-CM

## 2023-02-21 DIAGNOSIS — R7989 Other specified abnormal findings of blood chemistry: Secondary | ICD-10-CM

## 2023-02-21 LAB — T3, FREE: T3, Free: 3.2 pg/mL (ref 2.3–4.2)

## 2023-02-21 LAB — T4, FREE: Free T4: 1.31 ng/dL (ref 0.60–1.60)

## 2023-02-21 LAB — TSH: TSH: 0.96 u[IU]/mL (ref 0.35–5.50)

## 2023-02-21 MED ORDER — ESCITALOPRAM OXALATE 10 MG PO TABS
ORAL_TABLET | ORAL | 0 refills | Status: DC
Start: 2023-02-21 — End: 2023-03-27

## 2023-02-21 NOTE — Progress Notes (Signed)
Established Patient Office Visit   Subjective:  Patient ID: Edward Walls, male    DOB: 19-Dec-1980  Age: 42 y.o. MRN: [id number]  Chief Complaint  Patient presents with   Emesis    Patient still vomiting, would like to be checked for possible poisoning, or pancreatitis. Heart racing at times, SOB, losing weight.     Emesis  Pertinent negatives include no abdominal pain or myalgias.   Encounter Diagnoses  Name Primary?   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) Yes   Nausea and vomiting, unspecified vomiting type    Low TSH level    Depression with anxiety    Abnormal loss of weight    For follow-up of above.  Struggling with weight loss.  Appetite is suppressed.  Struggling to control diabetes.  He worries some that his ex may be poisoning him.  He has some contact with her.  He is excepted food from her.  Last hemoglobin A1c was 9.4.  He is taking metoclopramide presumably for diabetic gastroparesis.  Review of labs shows a low TSH.  He has experienced palpitations.  He has been depressed.  He has stopped the would sometimes be better if he were not here.  He has never seriously thought about hurting himself.  He has 2 sons 28 and 21 who are doing well.  He is proud of them.   Review of Systems  Constitutional: Negative.   HENT: Negative.    Eyes:  Negative for blurred vision, discharge and redness.  Respiratory: Negative.    Cardiovascular: Negative.   Gastrointestinal:  Positive for vomiting. Negative for abdominal pain.  Genitourinary: Negative.   Musculoskeletal: Negative.  Negative for myalgias.  Skin:  Negative for rash.  Neurological:  Negative for tingling, loss of consciousness and weakness.  Endo/Heme/Allergies:  Negative for polydipsia.     Current Outpatient Medications:    Continuous Blood Gluc Sensor (DEXCOM G6 SENSOR) MISC, Replace every 10 days, Disp: 9 each, Rfl: 3   Continuous Blood Gluc Sensor (FREESTYLE LIBRE 2 SENSOR)  MISC, CHANGE SENSOR EVERY 14 DAYS, Disp: 6 each, Rfl: 2   Continuous Blood Gluc Transmit (DEXCOM G6 TRANSMITTER) MISC, 1 Device by Does not apply route every 3 (three) months., Disp: 1 each, Rfl: 3   Continuous Blood Gluc Transmit (DEXCOM G6 TRANSMITTER) MISC, 1 Device by Does not apply route every 3 (three) months., Disp: 1 each, Rfl: 3   escitalopram (LEXAPRO) 10 MG tablet, Take 1 tablet (10 mg total) by mouth daily for 7 days, THEN 2 tablets (20 mg total) daily., Disp: 67 tablet, Rfl: 0   gabapentin (NEURONTIN) 300 MG capsule, Take 2 capsules (600 mg total) by mouth 3 (three) times daily., Disp: 180 capsule, Rfl: 11   insulin aspart (NOVOLOG) 100 UNIT/ML FlexPen, Inject 10 Units into the skin 3 (three) times daily with meals., Disp: 15 mL, Rfl: 2   Insulin Disposable Pump (OMNIPOD 5 G6 INTRO, GEN 5,) KIT, 1 each by Does not apply route as needed., Disp: 1 kit, Rfl: 0   insulin glargine (LANTUS) 100 UNIT/ML Solostar Pen, Inject 30 Units into the skin daily., Disp: 9 mL, Rfl: 2   Insulin Pen Needle (PEN NEEDLES 3/16") 31G X 5 MM MISC, Use as directed with insulin pen, Disp: 100 each, Rfl: 2   Insulin Pen Needle 32G X 4 MM MISC, Use 4-5x a day, Disp: 300 each, Rfl: 3   lidocaine (LIDODERM) 5 %, Place 1 patch onto the skin daily.  Remove & Discard patch within 12 hours or as directed by MD, Disp: 30 patch, Rfl: 0   metoCLOPramide (REGLAN) 10 MG tablet, Take 1 tablet (10 mg total) by mouth 3 (three) times daily with meals for 7 days, THEN 1 tablet (10 mg total) every 8 (eight) hours as needed for up to 7 days for nausea., Disp: 60 tablet, Rfl: 1   ondansetron (ZOFRAN) 4 MG tablet, Take 1 tablet (4 mg total) by mouth every 6 (six) hours as needed for nausea or vomiting., Disp: 20 tablet, Rfl: 0   pantoprazole (PROTONIX) 40 MG tablet, Take 1 tablet (40 mg total) by mouth daily. (Patient not taking: Reported on 01/27/2023), Disp: 30 tablet, Rfl: 11   polyethylene glycol (MIRALAX / GLYCOLAX) 17 g packet, Take  17 g by mouth daily as needed for moderate constipation. (Patient not taking: Reported on 02/21/2023), Disp: 14 each, Rfl: 0   potassium chloride SA (KLOR-CON M) 20 MEQ tablet, Take 2 tablets (40 mEq total) by mouth 2 (two) times daily. (Patient not taking: Reported on 02/21/2023), Disp: 10 tablet, Rfl: 0   Objective:     BP 122/72 (BP Location: Right Arm, Patient Position: Sitting, Cuff Size: Normal)   Pulse (!) 108   Temp 98.1 F (36.7 C) (Temporal)   Ht 6\' 3"  (1.905 m)   Wt 145 lb (65.8 kg)   SpO2 97%   BMI 18.12 kg/m  Wt Readings from Last 3 Encounters:  02/21/23 145 lb (65.8 kg)  02/18/23 150 lb (68 kg)  02/08/23 157 lb 12.8 oz (71.6 kg)      Physical Exam Constitutional:      General: He is not in acute distress.    Appearance: Normal appearance. He is not ill-appearing, toxic-appearing or diaphoretic.  HENT:     Head: Normocephalic and atraumatic.     Right Ear: External ear normal.     Left Ear: External ear normal.  Eyes:     General: No scleral icterus.       Right eye: No discharge.        Left eye: No discharge.     Extraocular Movements: Extraocular movements intact.     Conjunctiva/sclera: Conjunctivae normal.  Cardiovascular:     Rate and Rhythm: Normal rate and regular rhythm.  Pulmonary:     Effort: Pulmonary effort is normal. No respiratory distress.     Breath sounds: Normal breath sounds.  Abdominal:     General: Bowel sounds are normal.  Skin:    General: Skin is warm and dry.  Neurological:     Mental Status: He is alert and oriented to person, place, and time.  Psychiatric:        Mood and Affect: Mood normal.        Behavior: Behavior normal.      No results found for any visits on 02/21/23.    The ASCVD Risk score (Arnett DK, et al., 2019) failed to calculate for the following reasons:   The valid total cholesterol range is 130 to 320 mg/dL    Assessment & Plan:   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  (HCC) -     Ambulatory referral to Gastroenterology  Nausea and vomiting, unspecified vomiting type -     Heavy Metals Profile, Urine  Low TSH level -     TSH -     T3, free -     T4, free  Depression with anxiety -     Escitalopram  Oxalate; Take 1 tablet (10 mg total) by mouth daily for 7 days, THEN 2 tablets (20 mg total) daily.  Dispense: 67 tablet; Refill: 0  Abnormal loss of weight -     Heavy Metals Profile, Urine    Return in about 5 weeks (around 03/28/2023).  Will start Lexapro 10 mg moving to 20.  Looking for mood elevation, relief of anxiety and possibly increased appetite.  Stressed the importance of eating regular meals with his type 1 diabetes.  Rechecking TSH with free T3 and T4 levels.  Do not see regular follow-up with GI.  Mliss Sax, MD

## 2023-02-24 LAB — HEAVY METALS PROFILE, URINE
Arsenic, 24H Ur: <10 mcg/L (ref <=80)
Lead, 24 hr urine: <10 mcg/L (ref <80)
Mercury, 24H Ur: <4 mcg/L (ref <=20)

## 2023-03-01 ENCOUNTER — Encounter: Payer: Self-pay | Admitting: Cardiology

## 2023-03-01 ENCOUNTER — Encounter: Payer: Self-pay | Admitting: Internal Medicine

## 2023-03-01 DIAGNOSIS — E1010 Type 1 diabetes mellitus with ketoacidosis without coma: Secondary | ICD-10-CM

## 2023-03-02 MED ORDER — DEXCOM G6 TRANSMITTER MISC
1.0000 | 3 refills | Status: DC
Start: 2023-03-02 — End: 2024-01-16

## 2023-03-02 MED ORDER — DEXCOM G6 SENSOR MISC
3 refills | Status: AC
Start: 2023-03-02 — End: ?

## 2023-03-05 ENCOUNTER — Encounter: Payer: Self-pay | Admitting: Family Medicine

## 2023-03-05 ENCOUNTER — Ambulatory Visit: Payer: Managed Care, Other (non HMO) | Admitting: Physician Assistant

## 2023-03-10 ENCOUNTER — Encounter: Payer: Self-pay | Admitting: Family Medicine

## 2023-03-10 ENCOUNTER — Encounter: Payer: Self-pay | Admitting: Internal Medicine

## 2023-03-13 ENCOUNTER — Encounter: Payer: Self-pay | Admitting: Family Medicine

## 2023-03-13 ENCOUNTER — Telehealth: Payer: Self-pay | Admitting: Family Medicine

## 2023-03-13 NOTE — Telephone Encounter (Signed)
Pt want to know what [facility] said from April 17 th about his stomach pain .please call the patient because the patient would like for the issue to be solved so he can go back to work

## 2023-03-14 NOTE — Telephone Encounter (Signed)
Informed patient that we have not received any notes from [facility] asked patient to check and see if they would refax

## 2023-03-20 NOTE — Transitions of Care (Post Inpatient/ED Visit) (Signed)
   03/20/2023  Name: CHIDERA MIYASHIRO MRN: [id number] DOB: 1981/06/03  Today's TOC FU Call Status: Today's TOC FU Call Status:: Unsuccessul Call (1st Attempt) Unsuccessful Call (1st Attempt) Date: 02/19/23  Attempted to reach the patient regarding the most recent Inpatient/ED visit.  Follow Up Plan: No further outreach attempts will be made at this time. We have been unable to contact the patient.  Signature Arvil Persons, BSN, Charity fundraiser

## 2023-03-25 ENCOUNTER — Other Ambulatory Visit: Payer: Self-pay | Admitting: Family Medicine

## 2023-03-25 DIAGNOSIS — F418 Other specified anxiety disorders: Secondary | ICD-10-CM

## 2023-03-26 ENCOUNTER — Encounter (HOSPITAL_COMMUNITY): Payer: Self-pay

## 2023-03-26 ENCOUNTER — Emergency Department (HOSPITAL_COMMUNITY): Payer: Managed Care, Other (non HMO)

## 2023-03-26 ENCOUNTER — Other Ambulatory Visit: Payer: Self-pay

## 2023-03-26 ENCOUNTER — Emergency Department (HOSPITAL_COMMUNITY)
Admission: EM | Admit: 2023-03-26 | Discharge: 2023-03-26 | Disposition: A | Discharge status: Home / Self Care | Payer: Managed Care, Other (non HMO) | Attending: Emergency Medicine | Admitting: Emergency Medicine

## 2023-03-26 ENCOUNTER — Telehealth: Payer: Self-pay | Admitting: Family Medicine

## 2023-03-26 ENCOUNTER — Ambulatory Visit: Payer: Managed Care, Other (non HMO) | Admitting: Family Medicine

## 2023-03-26 DIAGNOSIS — R1013 Epigastric pain: Secondary | ICD-10-CM | POA: Diagnosis not present

## 2023-03-26 DIAGNOSIS — E1065 Type 1 diabetes mellitus with hyperglycemia: Secondary | ICD-10-CM | POA: Diagnosis not present

## 2023-03-26 DIAGNOSIS — E860 Dehydration: Secondary | ICD-10-CM

## 2023-03-26 DIAGNOSIS — Z91010 Allergy to peanuts: Secondary | ICD-10-CM | POA: Diagnosis not present

## 2023-03-26 DIAGNOSIS — Z794 Long term (current) use of insulin: Secondary | ICD-10-CM | POA: Diagnosis not present

## 2023-03-26 DIAGNOSIS — R110 Nausea: Secondary | ICD-10-CM | POA: Insufficient documentation

## 2023-03-26 DIAGNOSIS — R531 Weakness: Secondary | ICD-10-CM | POA: Diagnosis not present

## 2023-03-26 LAB — CBC WITH DIFFERENTIAL/PLATELET
Abs Immature Granulocytes: 0.01 10*3/uL (ref 0.00–0.07)
Basophils Absolute: 0 10*3/uL (ref 0.0–0.1)
Basophils Relative: 0 %
Eosinophils Absolute: 0 10*3/uL (ref 0.0–0.5)
Eosinophils Relative: 0 %
HCT: 46.8 % (ref 39.0–52.0)
Hemoglobin: 15.1 g/dL (ref 13.0–17.0)
Immature Granulocytes: 0 %
Lymphocytes Relative: 30 %
Lymphs Abs: 1.6 10*3/uL (ref 0.7–4.0)
MCH: 27.6 pg (ref 26.0–34.0)
MCHC: 32.3 g/dL (ref 30.0–36.0)
MCV: 85.6 fL (ref 80.0–100.0)
Monocytes Absolute: 0.3 10*3/uL (ref 0.1–1.0)
Monocytes Relative: 5 %
Neutro Abs: 3.5 10*3/uL (ref 1.7–7.7)
Neutrophils Relative %: 65 %
Platelets: 273 10*3/uL (ref 150–400)
RBC: 5.47 MIL/uL (ref 4.22–5.81)
RDW: 12.3 % (ref 11.5–15.5)
WBC: 5.3 10*3/uL (ref 4.0–10.5)
nRBC: 0 % (ref 0.0–0.2)

## 2023-03-26 LAB — BASIC METABOLIC PANEL
Anion gap: 17 — ABNORMAL HIGH (ref 5–15)
BUN: 23 mg/dL — ABNORMAL HIGH (ref 6–20)
CO2: 24 mmol/L (ref 22–32)
Calcium: 9 mg/dL (ref 8.9–10.3)
Chloride: 90 mmol/L — ABNORMAL LOW (ref 98–111)
Creatinine, Ser: 0.96 mg/dL (ref 0.61–1.24)
GFR, Estimated: >60 mL/min (ref >60)
Glucose, Bld: 320 mg/dL — ABNORMAL HIGH (ref 70–99)
Potassium: 4.3 mmol/L (ref 3.5–5.1)
Sodium: 131 mmol/L — ABNORMAL LOW (ref 135–145)

## 2023-03-26 LAB — URINALYSIS, ROUTINE W REFLEX MICROSCOPIC
Bilirubin Urine: NEGATIVE
Glucose, UA: >=500 mg/dL — AB
Hgb urine dipstick: NEGATIVE
Ketones, ur: 80 mg/dL — AB
Leukocytes,Ua: NEGATIVE
Nitrite: NEGATIVE
Protein, ur: NEGATIVE mg/dL
Specific Gravity, Urine: 1.024 (ref 1.005–1.030)
pH: 6 (ref 5.0–8.0)

## 2023-03-26 LAB — CBG MONITORING, ED
Glucose-Capillary: 316 mg/dL — ABNORMAL HIGH (ref 70–99)
Glucose-Capillary: 335 mg/dL — ABNORMAL HIGH (ref 70–99)
Glucose-Capillary: 260 mg/dL — ABNORMAL HIGH (ref 70–99)
Glucose-Capillary: 185 mg/dL — ABNORMAL HIGH (ref 70–99)
Glucose-Capillary: 158 mg/dL — ABNORMAL HIGH (ref 70–99)
Glucose-Capillary: 185 mg/dL — ABNORMAL HIGH (ref 70–99)

## 2023-03-26 LAB — HEPATIC FUNCTION PANEL
ALT: 66 U/L — ABNORMAL HIGH (ref 0–44)
AST: 33 U/L (ref 15–41)
Albumin: 4.1 g/dL (ref 3.5–5.0)
Alkaline Phosphatase: 58 U/L (ref 38–126)
Bilirubin, Direct: 0.3 mg/dL — ABNORMAL HIGH (ref 0.0–0.2)
Indirect Bilirubin: 4.2 mg/dL — ABNORMAL HIGH (ref 0.3–0.9)
Total Bilirubin: 4.5 mg/dL — ABNORMAL HIGH (ref 0.3–1.2)
Total Protein: 7.6 g/dL (ref 6.5–8.1)

## 2023-03-26 LAB — BLOOD GAS, VENOUS
Acid-Base Excess: 1.5 mmol/L (ref 0.0–2.0)
Bicarbonate: 27.2 mmol/L (ref 20.0–28.0)
O2 Saturation: 47.7 %
Patient temperature: 37
pCO2, Ven: 46 mmHg (ref 44–60)
pH, Ven: 7.38 (ref 7.25–7.43)
pO2, Ven: <31 mmHg — CL (ref 32–45)

## 2023-03-26 LAB — TROPONIN I (HIGH SENSITIVITY)
Troponin I (High Sensitivity): 3 ng/L (ref <18)
Troponin I (High Sensitivity): 3 ng/L (ref <18)

## 2023-03-26 LAB — BETA-HYDROXYBUTYRIC ACID: Beta-Hydroxybutyric Acid: 5.29 mmol/L — ABNORMAL HIGH (ref 0.05–0.27)

## 2023-03-26 LAB — LIPASE, BLOOD: Lipase: 28 U/L (ref 11–51)

## 2023-03-26 LAB — TSH: TSH: 0.395 u[IU]/mL (ref 0.350–4.500)

## 2023-03-26 LAB — LACTIC ACID, PLASMA
Lactic Acid, Venous: 1.8 mmol/L (ref 0.5–1.9)
Lactic Acid, Venous: 2.1 mmol/L (ref 0.5–1.9)

## 2023-03-26 LAB — D-DIMER, QUANTITATIVE: D-Dimer, Quant: 0.55 ug/mL-FEU — ABNORMAL HIGH (ref 0.00–0.50)

## 2023-03-26 MED ORDER — DICYCLOMINE HCL 10 MG PO CAPS
20.0000 mg | ORAL_CAPSULE | Freq: Once | ORAL | Status: AC
  Administered 2023-03-26: 20 mg via ORAL
  Filled 2023-03-26: qty 2

## 2023-03-26 MED ORDER — INSULIN REGULAR(HUMAN) IN NACL 100-0.9 UT/100ML-% IV SOLN
INTRAVENOUS | Status: DC
  Administered 2023-03-26: 10 [IU]/h via INTRAVENOUS
  Filled 2023-03-26: qty 100

## 2023-03-26 MED ORDER — IOHEXOL 350 MG/ML SOLN
100.0000 mL | Freq: Once | INTRAVENOUS | Status: AC | PRN
  Administered 2023-03-26: 100 mL via INTRAVENOUS

## 2023-03-26 MED ORDER — METOCLOPRAMIDE HCL 5 MG/ML IJ SOLN
10.0000 mg | Freq: Once | INTRAMUSCULAR | Status: AC
  Administered 2023-03-26: 10 mg via INTRAVENOUS
  Filled 2023-03-26: qty 2

## 2023-03-26 MED ORDER — ACETAMINOPHEN 500 MG PO TABS
1000.0000 mg | ORAL_TABLET | Freq: Once | ORAL | Status: AC
  Administered 2023-03-26: 1000 mg via ORAL
  Filled 2023-03-26: qty 2

## 2023-03-26 MED ORDER — LACTATED RINGERS IV BOLUS
1000.0000 mL | Freq: Once | INTRAVENOUS | Status: AC
  Administered 2023-03-26: 1000 mL via INTRAVENOUS

## 2023-03-26 MED ORDER — ONDANSETRON HCL 4 MG/2ML IJ SOLN
4.0000 mg | Freq: Once | INTRAMUSCULAR | Status: AC
  Administered 2023-03-26: 4 mg via INTRAVENOUS
  Filled 2023-03-26: qty 2

## 2023-03-26 MED ORDER — LACTATED RINGERS IV SOLN: INTRAVENOUS | Status: DC

## 2023-03-26 MED ORDER — DEXTROSE 50 % IV SOLN: 0.0000 mL | INTRAVENOUS | Status: DC | PRN

## 2023-03-26 MED ORDER — DEXTROSE IN LACTATED RINGERS 5 % IV SOLN: INTRAVENOUS | Status: DC

## 2023-03-26 NOTE — ED Provider Notes (Signed)
[place] EMERGENCY DEPARTMENT AT [facility] Provider Note   CSN: [id number] Arrival date & time: 03/26/23  1059     History  Chief Complaint  Patient presents with   Weakness    Edward Walls is a 42 y.o. male history of type 1 diabetes, DKA, diabetic neuropathy, gastroparesis presented with palpitations and nausea for the past 3 days.  Patient states he has been able to eat the past 3 days due to the nausea.  Patient denies any emesis.  Patient states he takes short acting insulin 10 units daily as his prescribed 6 units has not been helping.  Patient states that even though his knee in the past 3 days his sugar remains in the 300s and that he is drinking plenty of water.  Patient feels that he is in DKA.  Patient states he does have history of alcohol use disorder however has not had alcohol since January.  Patient states he is also on 39 units of long-acting insulin daily.  Patient denied chest pain, shortness of breath, dysuria, change in sensation/motor skills, LOC, fevers  Home Medications Prior to Admission medications   Medication Sig Start Date End Date Taking? Authorizing Provider  Continuous Blood Gluc Sensor (FREESTYLE LIBRE 2 SENSOR) MISC CHANGE SENSOR EVERY 14 DAYS 08/04/22   Mliss Sax, MD  Continuous Glucose Sensor (DEXCOM G6 SENSOR) MISC Replace every 10 days 03/02/23   Carlus Pavlov, MD  Continuous Glucose Transmitter (DEXCOM G6 TRANSMITTER) MISC 1 Device by Does not apply route every 3 (three) months. 03/02/23   Carlus Pavlov, MD  escitalopram (LEXAPRO) 10 MG tablet Take 1 tablet (10 mg total) by mouth daily for 7 days, THEN 2 tablets (20 mg total) daily. 02/21/23 03/30/23  Mliss Sax, MD  gabapentin (NEURONTIN) 300 MG capsule Take 2 capsules (600 mg total) by mouth 3 (three) times daily. 06/27/22   Levert Feinstein, MD  insulin aspart (NOVOLOG) 100 UNIT/ML FlexPen Inject 10 Units into the skin 3 (three) times daily with meals.  01/29/23 04/29/23  [name], [name], DO  Insulin Disposable Pump (OMNIPOD 5 G6 INTRO, GEN 5,) KIT 1 each by Does not apply route as needed. 02/20/22   Carlus Pavlov, MD  insulin glargine (LANTUS) 100 UNIT/ML Solostar Pen Inject 30 Units into the skin daily. 01/29/23 04/29/23  [name], [name], DO  Insulin Pen Needle (PEN NEEDLES 3/16") 31G X 5 MM MISC Use as directed with insulin pen 01/29/23   [name], [name], DO  Insulin Pen Needle 32G X 4 MM MISC Use 4-5x a day 05/26/22   Marguerita Merles Latif, DO  lidocaine (LIDODERM) 5 % Place 1 patch onto the skin daily. Remove & Discard patch within 12 hours or as directed by MD 01/30/23   [name], [name], DO  metoCLOPramide (REGLAN) 10 MG tablet Take 1 tablet (10 mg total) by mouth 3 (three) times daily with meals for 7 days, THEN 1 tablet (10 mg total) every 8 (eight) hours as needed for up to 7 days for nausea. 01/29/23 02/21/23  [name], [name], DO  ondansetron (ZOFRAN) 4 MG tablet Take 1 tablet (4 mg total) by mouth every 6 (six) hours as needed for nausea or vomiting. 12/29/22   [name], MD  pantoprazole (PROTONIX) 40 MG tablet Take 1 tablet (40 mg total) by mouth daily. Patient not taking: Reported on 01/27/2023 10/28/22 10/28/23  Kathlen Mody, MD  polyethylene glycol (MIRALAX / GLYCOLAX) 17 g packet Take 17 g by mouth daily  as needed for moderate constipation. Patient not taking: Reported on 02/21/2023 05/26/22   Marguerita Merles Latif, DO  potassium chloride SA (KLOR-CON M) 20 MEQ tablet Take 2 tablets (40 mEq total) by mouth 2 (two) times daily. Patient not taking: Reported on 02/21/2023 12/29/22   [name], MD      Allergies    Other and Peanut-containing drug products    Review of Systems   Review of Systems  Neurological:  Positive for weakness.    Physical Exam Updated Vital Signs BP (!) 139/102   Pulse 95   Temp 98 F (36.7 C) (Oral)   Resp 15   Ht 6\' 3"  (1.905 m)   Wt 65.8 kg   SpO2 100%   BMI 18.13 kg/m  Physical  Exam Constitutional:      General: He is not in acute distress.    Appearance: He is ill-appearing.  Eyes:     Extraocular Movements: Extraocular movements intact.     Conjunctiva/sclera: Conjunctivae normal.     Pupils: Pupils are equal, round, and reactive to light.  Cardiovascular:     Rate and Rhythm: Normal rate and regular rhythm.     Pulses: Normal pulses.     Heart sounds: Normal heart sounds.  Pulmonary:     Effort: Pulmonary effort is normal. No respiratory distress.     Breath sounds: Normal breath sounds.  Abdominal:     Palpations: Abdomen is soft.     Tenderness: There is abdominal tenderness (Epigastric). There is no guarding or rebound.  Musculoskeletal:        General: Normal range of motion.     Cervical back: Normal range of motion.  Skin:    General: Skin is warm and dry.     Capillary Refill: Capillary refill takes less than 2 seconds.  Neurological:     General: No focal deficit present.     Mental Status: He is alert and oriented to person, place, and time.     ED Results / Procedures / Treatments   Labs (all labs ordered are listed, but only abnormal results are displayed) Labs Reviewed  CBG MONITORING, ED - Abnormal; Notable for the following components:      Result Value   Glucose-Capillary 316 (*)    All other components within normal limits  BASIC METABOLIC PANEL  CBC WITH DIFFERENTIAL/PLATELET  URINALYSIS, ROUTINE W REFLEX MICROSCOPIC  BETA-HYDROXYBUTYRIC ACID  BLOOD GAS, VENOUS  TSH    EKG None  Radiology No results found.  Procedures Procedures    Medications Ordered in ED Medications  insulin regular, human (MYXREDLIN) 100 units/ 100 mL infusion (has no administration in time range)  lactated ringers infusion (has no administration in time range)  dextrose 5 % in lactated ringers infusion (has no administration in time range)  dextrose 50 % solution 0-50 mL (has no administration in time range)    ED Course/ Medical  Decision Making/ A&P                             Medical Decision Making Amount and/or Complexity of Data Reviewed Labs: ordered. Radiology: ordered.  Risk Prescription drug management.   Jahziel Lurry Craddock 42 y.o. presented today for palpitations and nausea. Working DDx that I considered at this time includes, but not limited to, hyperglycemia, hyperthyroid, electrolyte abnormalities, arrhythmia, viral illness, DKA, lactic acidosis, PE, ACS.  R/o DDx: Cannot be determined at this time  Review of  prior external notes: 02/18/2023 ED provider  Unique Tests and My Interpretation:  Beta-hydroxybutyrate: Elevated 5.29 VBG: Unremarkable CBG: 335, 260, 185 BMP: Glucose 320 anion gap 17 CBC: Unremarkable UA: Unremarkable TSH: Low 0.395, similar to previous reading Lipase: Negative Hepatic function panel: Elevated total bilirubin 4.5, elevated indirect bilirubin 4.2 Lactic acid: Negative Chest x-ray: No acute cardiopulmonary changes EKG: Sinus tach 111, biatrial enlargement, no ST elevations or depressions D-dimer: Elevated 0.55  Discussion with Independent Historian:  Father  Discussion of Management of Tests: None  Risk: Pending  Risk Stratification Score: None  Staffed with Tegeler, MD  Plan: Patient presented for palpitations and nausea. On exam patient was in no acute distress but did appear ill.  Patient was tender in the epigastric region however did not try peritoneal signs.  Patient's CBG upon arrival was 316.  Patient does have history of DKA with recent admission 2 months ago.  Patient states that he is compliant with his insulin and has been drinking plenty of water and denies any alcohol use.  Patient does have history of low TSH and so TSH level will be ordered as patient was endorsing palpitations.  The labs, chest x-ray, EKG, fluids ordered and patient will be monitored.  Patient stable at this time.  I spoke to the attending and we agreed to add on a D-dimer due  to patient's persistent tachycardia.  Patient's EKG also was unusual but similar to previous readings and so troponin was added as well.  Patient was signed out to [employer], PA-C.  Anticipate admission pending labs due to ongoing nausea and weakness.  Patient stable at this time.  Patient D-dimer came back elevated and so patient will undergo PE study.         Final Clinical Impression(s) / ED Diagnoses Final diagnoses:  None    Rx / DC Orders ED Discharge Orders     None         Remi Deter 03/26/23 1526    Tegeler, Canary Brim, MD 03/26/23 1537

## 2023-03-26 NOTE — Discharge Instructions (Addendum)
Hydrate at home.  Take reglan every 8 hours for at least 3 doses. Then as needed. Followup with Dr. Doreene Burke.

## 2023-03-26 NOTE — ED Provider Notes (Signed)
Accepted handoff at shift change from JS PA-C. Please see prior provider note for more detail.   Briefly: Patient is 42 y.o.   DDX: concern for DKA, dehydration  Plan: hydrate, FU on labs.        Physical Exam  BP (!) 151/97   Pulse 90   Temp 98 F (36.7 C) (Oral)   Resp 16   Ht 6\' 3"  (1.905 m)   Wt 65.8 kg   SpO2 100%   BMI 18.13 kg/m   Physical Exam  Procedures  Procedures Results for orders placed or performed during the hospital encounter of 03/26/23  Basic metabolic panel  Result Value Ref Range   Sodium 131 (L) 135 - 145 mmol/L   Potassium 4.3 3.5 - 5.1 mmol/L   Chloride 90 (L) 98 - 111 mmol/L   CO2 24 22 - 32 mmol/L   Glucose, Bld 320 (H) 70 - 99 mg/dL   BUN 23 (H) 6 - 20 mg/dL   Creatinine, Ser 1.61 0.61 - 1.24 mg/dL   Calcium 9.0 8.9 - 09.6 mg/dL   GFR, Estimated >04 >54 mL/min   Anion gap 17 (H) 5 - 15  CBC with Differential (PNL)  Result Value Ref Range   WBC 5.3 4.0 - 10.5 K/uL   RBC 5.47 4.22 - 5.81 MIL/uL   Hemoglobin 15.1 13.0 - 17.0 g/dL   HCT 09.8 11.9 - 14.7 %   MCV 85.6 80.0 - 100.0 fL   MCH 27.6 26.0 - 34.0 pg   MCHC 32.3 30.0 - 36.0 g/dL   RDW 82.9 56.2 - 13.0 %   Platelets 273 150 - 400 K/uL   nRBC 0.0 0.0 - 0.2 %   Neutrophils Relative % 65 %   Neutro Abs 3.5 1.7 - 7.7 K/uL   Lymphocytes Relative 30 %   Lymphs Abs 1.6 0.7 - 4.0 K/uL   Monocytes Relative 5 %   Monocytes Absolute 0.3 0.1 - 1.0 K/uL   Eosinophils Relative 0 %   Eosinophils Absolute 0.0 0.0 - 0.5 K/uL   Basophils Relative 0 %   Basophils Absolute 0.0 0.0 - 0.1 K/uL   Immature Granulocytes 0 %   Abs Immature Granulocytes 0.01 0.00 - 0.07 K/uL  Urinalysis, Routine w reflex microscopic -Urine, Clean Catch  Result Value Ref Range   Color, Urine YELLOW YELLOW   APPearance CLEAR CLEAR   Specific Gravity, Urine 1.024 1.005 - 1.030   pH 6.0 5.0 - 8.0   Glucose, UA >=500 (A) NEGATIVE mg/dL   Hgb urine dipstick NEGATIVE NEGATIVE   Bilirubin Urine NEGATIVE NEGATIVE    Ketones, ur 80 (A) NEGATIVE mg/dL   Protein, ur NEGATIVE NEGATIVE mg/dL   Nitrite NEGATIVE NEGATIVE   Leukocytes,Ua NEGATIVE NEGATIVE   RBC / HPF 0-5 0 - 5 RBC/hpf   WBC, UA 0-5 0 - 5 WBC/hpf   Bacteria, UA RARE (A) NONE SEEN   Squamous Epithelial / HPF 0-5 0 - 5 /HPF   Mucus PRESENT   Beta-hydroxybutyric acid  Result Value Ref Range   Beta-Hydroxybutyric Acid 5.29 (H) 0.05 - 0.27 mmol/L  Blood gas, venous  Result Value Ref Range   pH, Ven 7.38 7.25 - 7.43   pCO2, Ven 46 44 - 60 mmHg   pO2, Ven <31 (LL) 32 - 45 mmHg   Bicarbonate 27.2 20.0 - 28.0 mmol/L   Acid-Base Excess 1.5 0.0 - 2.0 mmol/L   O2 Saturation 47.7 %   Patient temperature 37.0   TSH  Result Value Ref Range   TSH 0.395 0.350 - 4.500 uIU/mL  Hepatic function panel  Result Value Ref Range   Total Protein 7.6 6.5 - 8.1 g/dL   Albumin 4.1 3.5 - 5.0 g/dL   AST 33 15 - 41 U/L   ALT 66 (H) 0 - 44 U/L   Alkaline Phosphatase 58 38 - 126 U/L   Total Bilirubin 4.5 (H) 0.3 - 1.2 mg/dL   Bilirubin, Direct 0.3 (H) 0.0 - 0.2 mg/dL   Indirect Bilirubin 4.2 (H) 0.3 - 0.9 mg/dL  Lipase, blood  Result Value Ref Range   Lipase 28 11 - 51 U/L  Lactic acid, plasma  Result Value Ref Range   Lactic Acid, Venous 1.8 0.5 - 1.9 mmol/L  Lactic acid, plasma  Result Value Ref Range   Lactic Acid, Venous 2.1 (HH) 0.5 - 1.9 mmol/L  D-dimer, quantitative  Result Value Ref Range   D-Dimer, Quant 0.55 (H) 0.00 - 0.50 ug/mL-FEU  CBG monitoring, ED  Result Value Ref Range   Glucose-Capillary 316 (H) 70 - 99 mg/dL  CBG monitoring, ED  Result Value Ref Range   Glucose-Capillary 335 (H) 70 - 99 mg/dL  CBG monitoring, ED  Result Value Ref Range   Glucose-Capillary 260 (H) 70 - 99 mg/dL  CBG monitoring, ED  Result Value Ref Range   Glucose-Capillary 185 (H) 70 - 99 mg/dL  CBG monitoring, ED  Result Value Ref Range   Glucose-Capillary 158 (H) 70 - 99 mg/dL  Troponin I (High Sensitivity)  Result Value Ref Range   Troponin I (High  Sensitivity) 3 <18 ng/L  Troponin I (High Sensitivity)  Result Value Ref Range   Troponin I (High Sensitivity) 3 <18 ng/L   CT Angio Chest PE W/Cm &/Or Wo Cm  Result Date: 03/26/2023 CLINICAL DATA:  Positive D-dimer. Low to intermediate probability pulmonary embolism. EXAM: CT ANGIOGRAPHY CHEST WITH CONTRAST TECHNIQUE: Multidetector CT imaging of the chest was performed using the standard protocol during bolus administration of intravenous contrast. Multiplanar CT image reconstructions and MIPs were obtained to evaluate the vascular anatomy. RADIATION DOSE REDUCTION: This exam was performed according to the departmental dose-optimization program which includes automated exposure control, adjustment of the mA and/or kV according to patient size and/or use of iterative reconstruction technique. CONTRAST:  OMNIPAQUE IOHEXOL 350 MG/ML SOLN COMPARISON:  None Available. FINDINGS: Cardiovascular: No filling defects within the pulmonary arteries to suggest acute pulmonary embolism. Mediastinum/Nodes: No axillary or supraclavicular adenopathy. No mediastinal or hilar adenopathy. No pericardial fluid. Esophagus normal. Lungs/Pleura: No pulmonary infarction. No pneumonia. No pleural fluid. No pneumothorax Upper Abdomen: Limited view of the liver, kidneys, pancreas are unremarkable. Normal adrenal glands. Musculoskeletal: No acute osseous abnormality. Review of the MIP images confirms the above findings. IMPRESSION: 1. No evidence acute pulmonary embolism. 2. No acute pulmonary parenchymal findings. Electronically Signed   By: Genevive Bi M.D.   On: 03/26/2023 16:25   DG Chest Portable 1 View  Result Date: 03/26/2023 CLINICAL DATA:  Weakness. EXAM: PORTABLE CHEST 1 VIEW COMPARISON:  12/29/2022 FINDINGS: The cardiomediastinal contours are normal. The lungs are clear. Pulmonary vasculature is normal. No consolidation, pleural effusion, or pneumothorax. Mild chronic scoliotic curvature. No acute osseous  abnormalities are seen. IMPRESSION: No acute chest findings. Electronically Signed   By: Narda Rutherford M.D.   On: 03/26/2023 12:14    ED Course / MDM    Medical Decision Making Amount and/or Complexity of Data Reviewed Labs: ordered. Radiology: ordered.  Risk OTC  drugs. Prescription drug management.   Patient was reassessed he appears somewhat dehydrated with mild tachycardia on the monitor and dry oral mucosa.  Will provide 1 L of lactated Ringer's and Bentyl and Tylenol.  Patient tolerated p.o. seems to be feeling much improved.  We discussed admission but will often said to discharge home.  UA with ketones present however VBG without acidosis.  Patient is tolerating p.o. and blood sugar has improved with insulin drip.  He is significantly improved vital signs are normal.  Tolerating p.o. and ambulatory.  Lactic acid 2.1 was obtained before patient was given a 1 L bolus.  I recommend close follow-up with PCP continued antihyperglycemic therapy at home and cessation of any marijuana.  CT PE study was negative for pulmonary embolism and no abnormal findings on CT of incidental nature.       Solon Augusta [place], [place] 03/27/23 1417    Gerhard Munch, MD 03/29/23 2212

## 2023-03-26 NOTE — ED Triage Notes (Signed)
Patient c/o weakness with palpitations and nausea. Per EMS Palpitations resolved. Patient has hyperglycemia. Patient reports symptoms x 3 days. Patient seen in ER one month ago for same symptoms

## 2023-03-26 NOTE — Telephone Encounter (Signed)
Pt was a no show for a hosp f/up with Dr Doreene Burke on 03/26/23, I did not send a letter. He is at [facility] right now.

## 2023-03-27 ENCOUNTER — Telehealth: Payer: Self-pay

## 2023-03-27 DIAGNOSIS — Z789 Other specified health status: Secondary | ICD-10-CM

## 2023-03-27 DIAGNOSIS — Z79899 Other long term (current) drug therapy: Secondary | ICD-10-CM

## 2023-03-27 NOTE — Addendum Note (Signed)
Addended by: Larey Dresser on: 03/27/2023 02:15 PM   Modules accepted: Orders

## 2023-03-27 NOTE — Transitions of Care (Post Inpatient/ED Visit) (Signed)
03/27/2023  Name: Edward Walls MRN: [id number] DOB: [birth year]  Today's TOC FU Call Status: Today's TOC FU Call Status:: Successful TOC FU Call Competed TOC FU Call Complete Date: 03/27/23  Transition Care Management Follow-up Telephone Call Date of Discharge: 03/26/23 Discharge Facility: [facility]) Type of Discharge: Emergency Department Primary Inpatient Discharge Diagnosis:: Dehydration How have you been since you were released from the hospital?: Better Any questions or concerns?: No  Items Reviewed: Did you receive and understand the discharge instructions provided?: Yes Medications obtained,verified, and reconciled?: Yes (Medications Reviewed) Any new allergies since your discharge?: No Dietary orders reviewed?: Yes Do you have support at home?: No I tried to order [id number] for this patient. However, because patient is not an ACO patient, unable to place the order.  Medications Reviewed Today: Medications Reviewed Today     Reviewed by Larey Dresser, RN (Registered Nurse) on 03/27/23 at 1337  Med List Status: <None>   Medication Order Taking? Sig Documenting Provider Last Dose Status Informant  Continuous Blood Gluc Sensor (FREESTYLE LIBRE 2 SENSOR) MISC [id number] No CHANGE SENSOR EVERY 14 DAYS  Patient not taking: Reported on 03/27/2023   Mliss Sax, MD Not Taking Active Self  Continuous Glucose Sensor (DEXCOM G6 SENSOR) MISC [id number] Yes Replace every 10 days Carlus Pavlov, MD Taking Active   Continuous Glucose Transmitter (DEXCOM G6 TRANSMITTER) MISC [id number] No 1 Device by Does not apply route every 3 (three) months.  Patient not taking: Reported on 03/27/2023   Carlus Pavlov, MD Not Taking Active   escitalopram (LEXAPRO) 10 MG tablet [id number] No Take 1 tablet (10 mg total) by mouth daily for 7 days, THEN 2 tablets (20 mg total) daily.  Patient not taking: Reported on 03/27/2023   Mliss Sax, MD Not Taking Active    gabapentin (NEURONTIN) 300 MG capsule [id number] Yes Take 2 capsules (600 mg total) by mouth 3 (three) times daily. Levert Feinstein, MD Taking Active Self  insulin aspart (NOVOLOG) 100 UNIT/ML FlexPen [id number] Yes Inject 10 Units into the skin 3 (three) times daily with meals. [name], [name], DO Taking Active   Insulin Disposable Pump (OMNIPOD 5 G6 INTRO, GEN 5,) KIT [id number] Yes 1 each by Does not apply route as needed. Carlus Pavlov, MD Taking Active Self  insulin glargine (LANTUS) 100 UNIT/ML Solostar Pen [id number] Yes Inject 30 Units into the skin daily. [name], [name], DO Taking Active   Insulin Pen Needle (PEN NEEDLES 3/16") 31G X 5 MM MISC [id number] No Use as directed with insulin pen  Patient not taking: Reported on 03/27/2023   [name], [name], DO Not Taking Active   Insulin Pen Needle 32G X 4 MM MISC [id number] Yes Use 4-5x a day Marguerita Merles Davey, DO Taking Active Self  lidocaine (LIDODERM) 5 % [id number] Yes Place 1 patch onto the skin daily. Remove & Discard patch within 12 hours or as directed by MD [name], [name], DO Taking Active   metoCLOPramide (REGLAN) 10 MG tablet [id number]  Take 1 tablet (10 mg total) by mouth 3 (three) times daily with meals for 7 days, THEN 1 tablet (10 mg total) every 8 (eight) hours as needed for up to 7 days for nausea. [name], [name], DO  Expired 02/21/23 2359   ondansetron (ZOFRAN) 4 MG tablet [id number] Yes Take 1 tablet (4 mg total) by mouth every 6 (six) hours as needed for nausea or vomiting. [name], MD Taking Active Self  pantoprazole (PROTONIX) 40 MG tablet  [id number] No Take 1 tablet (40 mg total) by mouth daily.  Patient not taking: Reported on 01/27/2023   Kathlen Mody, MD Not Taking Active Self  polyethylene glycol (MIRALAX / GLYCOLAX) 17 g packet [id number] No Take 17 g by mouth daily as needed for moderate constipation.  Patient not taking: Reported on 02/21/2023   Merlene Laughter, DO Not Taking Active Self  potassium chloride  SA (KLOR-CON M) 20 MEQ tablet [id number] No Take 2 tablets (40 mEq total) by mouth 2 (two) times daily.  Patient not taking: Reported on 02/21/2023   [name], MD Not Taking Active Self            Home Care and Equipment/Supplies: Were Home Health Services Ordered?: NA Any new equipment or medical supplies ordered?: NA  Functional Questionnaire: Do you need assistance with bathing/showering or dressing?: No Do you need assistance with meal preparation?: No Do you need assistance with eating?: No Do you have difficulty maintaining continence: No Do you need assistance with getting out of bed/getting out of a chair/moving?: No Do you have difficulty managing or taking your medications?: No  Follow up appointments reviewed: PCP Follow-up appointment confirmed?: Yes Date of PCP follow-up appointment?: 03/29/23 Follow-up Provider: Dr. Doreene Burke [facility] Follow-up appointment confirmed?: Yes Date of Specialist follow-up appointment?: 06/07/23 Follow-Up Specialty Provider:: Dr. Elvera Lennox Do you need transportation to your follow-up appointment?: No Do you understand care options if your condition(s) worsen?: Yes-patient verbalized understanding    SIGNATURE Arvil Persons, BSN, RN

## 2023-03-27 NOTE — Telephone Encounter (Signed)
No fee, pt was in ER. ER f/u scheduled 03/29/23

## 2023-03-27 NOTE — Telephone Encounter (Signed)
[id number] and [id number] placed for patient.

## 2023-03-28 ENCOUNTER — Telehealth: Payer: Self-pay

## 2023-03-28 NOTE — Progress Notes (Signed)
   Care Guide Note  03/28/2023 Name: Edward Walls MRN: [id number] DOB: 06-30-1981  Referred by: Mliss Sax, MD Reason for referral : Care Coordination (Outreach to schedule with pharm d )   Edward Walls is a 42 y.o. year old male who is a primary care patient of Mliss Sax, MD. [name] was referred to the pharmacist for assistance related to DM.    Successful contact was made with the patient to discuss pharmacy services including being ready for the pharmacist to call at least 5 minutes before the scheduled appointment time, to have medication bottles and any blood sugar or blood pressure readings ready for review. The patient agreed to meet with the pharmacist via with the pharmacist via telephone visit on (date/time).  04/02/2023  Penne Lash, RMA Care Guide [facility]  [place], [place] [address] Direct Dial: [phone] [email or web address]

## 2023-03-29 ENCOUNTER — Ambulatory Visit: Payer: Managed Care, Other (non HMO) | Admitting: Family Medicine

## 2023-03-29 ENCOUNTER — Encounter: Payer: Self-pay | Admitting: Family Medicine

## 2023-03-29 VITALS — BP 116/68 | HR 108 | Temp 98.2°F | Ht 75.0 in | Wt 143.8 lb

## 2023-03-29 DIAGNOSIS — R1013 Epigastric pain: Secondary | ICD-10-CM

## 2023-03-29 DIAGNOSIS — F418 Other specified anxiety disorders: Secondary | ICD-10-CM | POA: Diagnosis not present

## 2023-03-29 LAB — LIPASE: Lipase: 74 U/L — ABNORMAL HIGH (ref 11.0–59.0)

## 2023-03-29 LAB — HEPATIC FUNCTION PANEL
ALT: 27 U/L (ref 0–53)
AST: 12 U/L (ref 0–37)
Albumin: 3.7 g/dL (ref 3.5–5.2)
Alkaline Phosphatase: 45 U/L (ref 39–117)
Bilirubin, Direct: 0.3 mg/dL (ref 0.0–0.3)
Total Bilirubin: 1 mg/dL (ref 0.2–1.2)
Total Protein: 5.6 g/dL — ABNORMAL LOW (ref 6.0–8.3)

## 2023-03-29 LAB — AMYLASE: Amylase: 120 U/L (ref 27–131)

## 2023-03-29 NOTE — Progress Notes (Signed)
Established Patient Office Visit   Subjective:  Patient ID: Edward Walls, male    DOB: 07-27-1981  Age: 42 y.o. MRN: [id number]  Chief Complaint  Patient presents with   Hospitalization Follow-up    Hospital follow up concerns about continued stomach issues.     HPI Encounter Diagnoses  Name Primary?   Epigastric pain Yes   Depression with anxiety    For hospital discharge follow-up.  He is accompanied by his father.  Continues experiencing mid epigastric pain.  Father has been concerned about the possibility of porphyruria.  No alcohol since January.  He decided not to start the Lexapro.  Mood is improved.  He is working hard to control his diabetes.  Has follow-up planned with gastroenterologist at [facility] this coming Wednesday.   Review of Systems  Constitutional: Negative.   HENT: Negative.    Eyes:  Negative for blurred vision, discharge and redness.  Respiratory: Negative.    Cardiovascular: Negative.   Gastrointestinal:  Positive for abdominal pain. Negative for blood in stool, constipation, diarrhea, nausea and vomiting.  Genitourinary: Negative.   Musculoskeletal: Negative.  Negative for myalgias.  Skin:  Negative for rash.  Neurological:  Negative for tingling, loss of consciousness and weakness.  Endo/Heme/Allergies:  Negative for polydipsia.     Current Outpatient Medications:    Continuous Glucose Sensor (DEXCOM G6 SENSOR) MISC, Replace every 10 days, Disp: 9 each, Rfl: 3   gabapentin (NEURONTIN) 300 MG capsule, Take 2 capsules (600 mg total) by mouth 3 (three) times daily., Disp: 180 capsule, Rfl: 11   insulin aspart (NOVOLOG) 100 UNIT/ML FlexPen, Inject 10 Units into the skin 3 (three) times daily with meals., Disp: 15 mL, Rfl: 2   Insulin Disposable Pump (OMNIPOD 5 G6 INTRO, GEN 5,) KIT, 1 each by Does not apply route as needed., Disp: 1 kit, Rfl: 0   insulin glargine (LANTUS) 100 UNIT/ML Solostar Pen, Inject 30 Units into the skin daily., Disp: 9 mL,  Rfl: 2   Insulin Pen Needle 32G X 4 MM MISC, Use 4-5x a day, Disp: 300 each, Rfl: 3   lidocaine (LIDODERM) 5 %, Place 1 patch onto the skin daily. Remove & Discard patch within 12 hours or as directed by MD, Disp: 30 patch, Rfl: 0   ondansetron (ZOFRAN) 4 MG tablet, Take 1 tablet (4 mg total) by mouth every 6 (six) hours as needed for nausea or vomiting., Disp: 20 tablet, Rfl: 0   Continuous Blood Gluc Sensor (FREESTYLE LIBRE 2 SENSOR) MISC, CHANGE SENSOR EVERY 14 DAYS (Patient not taking: Reported on 03/27/2023), Disp: 6 each, Rfl: 2   Continuous Glucose Transmitter (DEXCOM G6 TRANSMITTER) MISC, 1 Device by Does not apply route every 3 (three) months. (Patient not taking: Reported on 03/27/2023), Disp: 1 each, Rfl: 3   escitalopram (LEXAPRO) 20 MG tablet, Take 1 tablet (20 mg total) by mouth daily. (Patient not taking: Reported on 03/29/2023), Disp: 30 tablet, Rfl: 1   Insulin Pen Needle (PEN NEEDLES 3/16") 31G X 5 MM MISC, Use as directed with insulin pen (Patient not taking: Reported on 03/27/2023), Disp: 100 each, Rfl: 2   metoCLOPramide (REGLAN) 10 MG tablet, Take 1 tablet (10 mg total) by mouth 3 (three) times daily with meals for 7 days, THEN 1 tablet (10 mg total) every 8 (eight) hours as needed for up to 7 days for nausea. (Patient not taking: Reported on 03/29/2023), Disp: 60 tablet, Rfl: 1   pantoprazole (PROTONIX) 40 MG tablet, Take 1  tablet (40 mg total) by mouth daily. (Patient not taking: Reported on 01/27/2023), Disp: 30 tablet, Rfl: 11   polyethylene glycol (MIRALAX / GLYCOLAX) 17 g packet, Take 17 g by mouth daily as needed for moderate constipation. (Patient not taking: Reported on 02/21/2023), Disp: 14 each, Rfl: 0   potassium chloride SA (KLOR-CON M) 20 MEQ tablet, Take 2 tablets (40 mEq total) by mouth 2 (two) times daily. (Patient not taking: Reported on 02/21/2023), Disp: 10 tablet, Rfl: 0   Objective:     BP 116/68 (BP Location: Left Arm, Patient Position: Sitting, Cuff Size: Normal)    Pulse (!) 108   Temp 98.2 F (36.8 C) (Temporal)   Ht 6\' 3"  (1.905 m)   Wt 143 lb 12.8 oz (65.2 kg)   SpO2 98%   BMI 17.97 kg/m  Wt Readings from Last 3 Encounters:  03/29/23 143 lb 12.8 oz (65.2 kg)  03/26/23 145 lb 1 oz (65.8 kg)  02/21/23 145 lb (65.8 kg)      Physical Exam Constitutional:      General: He is not in acute distress.    Appearance: Normal appearance. He is not ill-appearing, toxic-appearing or diaphoretic.  HENT:     Head: Normocephalic and atraumatic.     Right Ear: External ear normal.     Left Ear: External ear normal.  Eyes:     General: No scleral icterus.       Right eye: No discharge.        Left eye: No discharge.     Extraocular Movements: Extraocular movements intact.     Conjunctiva/sclera: Conjunctivae normal.  Cardiovascular:     Rate and Rhythm: Normal rate and regular rhythm.  Pulmonary:     Effort: Pulmonary effort is normal. No respiratory distress.     Breath sounds: Normal breath sounds.  Abdominal:     General: Bowel sounds are normal.     Tenderness: There is no abdominal tenderness. There is no guarding or rebound.  Musculoskeletal:     Cervical back: No rigidity or tenderness.  Skin:    General: Skin is warm and dry.  Neurological:     Mental Status: He is alert and oriented to person, place, and time.  Psychiatric:        Mood and Affect: Mood normal.        Behavior: Behavior normal.      No results found for any visits on 03/29/23.    The ASCVD Risk score (Arnett DK, et al., 2019) failed to calculate for the following reasons:   The valid total cholesterol range is 130 to 320 mg/dL    Assessment & Plan:   Epigastric pain -     Hepatic function panel -     Amylase -     Lipase -     Porphyrins, fractionated, random urine  Depression with anxiety    Return in about 3 months (around 06/29/2023).    Mliss Sax, MD

## 2023-03-29 NOTE — Addendum Note (Signed)
Addended by: Lindley Magnus L on: 03/29/2023 12:01 PM   Modules accepted: Orders

## 2023-03-30 ENCOUNTER — Telehealth: Payer: Self-pay | Admitting: Family Medicine

## 2023-03-30 NOTE — Telephone Encounter (Signed)
PAPERWORK/FORMS received  Dropped off by: pt Call back #: [phone] Individual made aware of 3-5 business day turn around (YES/NO): yes GREEN charge sheet completed and patient made aware of possible charge (YES/NO): yes Placed in provider folder at front desk. ~~~ route to CMA/provider Team  CLINICAL USE BELOW THIS LINE (use X to signify action taken)  ___ Form received and placed in providers office for signature. ___ Form completed and faxed to LOA Dept.  ___ Form completed & LVM to notify patient ready for pick up.  ___ Charge sheet and copy of form in front office folder for office supervisor.

## 2023-04-02 ENCOUNTER — Other Ambulatory Visit: Payer: Managed Care, Other (non HMO)

## 2023-04-02 NOTE — Telephone Encounter (Signed)
__X__Form received and placed in providers office for signature. ____Form completed and faxed to LOA Dept. ____Form completed & LVM to notify pt ready for pick up ____Charge sheet & copy of form in front office folder for office supervisor.   

## 2023-04-02 NOTE — Progress Notes (Unsigned)
04/02/2023 Name: Edward Walls MRN: [id number] DOB: 01-13-1981  Chief Complaint  Patient presents with   Medication Assistance   Edward Walls is a 42 y.o. year old male who presented for a telephone visit.   They were referred to the pharmacist by their PCP for assistance in managing medication access.   Subjective:  Care Team: Primary Care Provider: Mliss Sax, Walls ; Next Scheduled Visit: 9/13 Endocrinologist Dr. [name] ; Next Scheduled Visit: 8/22  Medication Access/Adherence [facility] [address] [place], [place] [phone] -Patient reports affordability concerns with their medications: No  -Patient reports access/transportation concerns to their pharmacy: Yes  -Patient reports adherence concerns with their medications:  Yes    Medication Management: -Patient reports Good adherence to medications- has all current medications on hand and taking as prescribed -Does endorse difficulty with transportation and states a pharmacy that delivers or does mail order would benefit from an adherence standpoint -Does not need any refills at this time -Has been unable to use Omnipod insulin delivery system recently due to the controller not loading correctly.  He is currently giving meal-time insulin with pen/pen needle.  Objective: Medications Reviewed Today     Reviewed by Edward Walls, [facility] (Pharmacist) on 04/02/23 at 0912  Med List Status: <None>   Medication Order Taking? Sig Documenting Provider Last Dose Status Informant  Continuous Glucose Sensor (DEXCOM G6 SENSOR) MISC [id number] Yes Replace every 10 days Edward Walls Taking Active   Continuous Glucose Transmitter (DEXCOM G6 TRANSMITTER) MISC [id number] Yes 1 Device by Does not apply route every 3 (three) months. Edward Walls Taking Active   escitalopram (LEXAPRO) 20 MG tablet [id number] No Take 1 tablet (20 mg total) by mouth daily.  Patient not taking:  Reported on 03/29/2023   Edward Sax, Walls Not Taking Active   gabapentin (NEURONTIN) 300 MG capsule [id number] Yes Take 2 capsules (600 mg total) by mouth 3 (three) times daily. Edward Walls Taking Active Self  insulin aspart (NOVOLOG) 100 UNIT/ML FlexPen [id number] Yes Inject 10 Units into the skin 3 (three) times daily with meals. [name], [name], DO Taking Active   Insulin Disposable Pump (OMNIPOD 5 G6 INTRO, GEN 5,) KIT [id number] No 1 each by Does not apply route as needed.  Patient not taking: Reported on 04/02/2023   Edward Walls Not Taking Active Self  insulin glargine (LANTUS) 100 UNIT/ML Solostar Pen [id number] Yes Inject 30 Units into the skin daily. [name], [name], DO Taking Active   Insulin Pen Needle 32G X 4 MM MISC [id number] Yes Use 4-5x a day Edward Merles Lumpkin, DO Taking Active Self  lidocaine (LIDODERM) 5 % [id number] Yes Place 1 patch onto the skin daily. Remove & Discard patch within 12 hours or as directed by Walls [name], [name], DO Taking Active   metoCLOPramide (REGLAN) 10 MG tablet [id number] Yes Take 1 tablet (10 mg total) by mouth 3 (three) times daily with meals for 7 days, THEN 1 tablet (10 mg total) every 8 (eight) hours as needed for up to 7 days for nausea. [name], [name], DO Taking Active   ondansetron (ZOFRAN) 4 MG tablet [id number] Yes Take 1 tablet (4 mg total) by mouth every 6 (six) hours as needed for nausea or vomiting. Edward Ade, Walls Taking Active Self           Assessment/Plan:   Medication Management: - Currently strategy insufficient to maintain appropriate adherence to prescribed  medication regimen - Discussed collaboration with local pharmacies for delivery or mail order- patient elects to transfer medications to [facility] for mail order at no additional cost to his copays - Verified active medication list and will collaborate with the pharmacy to transfer refills from [facility] and contact providers for prescriptions if refills  do not remain - Providing patient with pharmacy phone number via MyChart message.  Have made him aware to contact pharmacy when refills are due or request via MyChart. - Contacted Omnipod manufacturer, and they will be able to help patient troubleshoot his controller issue over the phone.  Providing product support number for him to call in MyChart message.  Follow Up Plan: None scheduled but can as needed per PCP/endocrinology  Edward Walls, PharmD, DPLA

## 2023-04-03 ENCOUNTER — Encounter: Payer: Self-pay | Admitting: Family Medicine

## 2023-04-03 ENCOUNTER — Other Ambulatory Visit: Payer: Managed Care, Other (non HMO)

## 2023-04-03 NOTE — Patient Instructions (Signed)
  Medicaid Managed Care   Unsuccessful Outreach Note  04/03/2023 Name: Edward Walls MRN: [id number] DOB: 01/08/1981  Referred by: Kremer, William Alfred, MD Reason for referral : High Risk Managed Medicaid (MM Social work unsuccessful telephone outreach )   An unsuccessful telephone outreach was attempted today. The patient was referred to the case management team for assistance with care management and care coordination.   Follow Up Plan: A HIPAA compliant phone message was left for the patient providing contact information and requesting a return call.   Aminah Zabawa, BSW, MHA [place]  Managed Medicaid Social Worker [phone]  

## 2023-04-03 NOTE — Patient Outreach (Signed)
  Medicaid Managed Care   Unsuccessful Outreach Note  04/03/2023 Name: Edward Walls MRN: [id number] DOB: 01-18-1981  Referred by: Mliss Sax, MD Reason for referral : High Risk Managed Medicaid (MM Social work unsuccessful telephone outreach )   An unsuccessful telephone outreach was attempted today. The patient was referred to the case management team for assistance with care management and care coordination.   Follow Up Plan: A HIPAA compliant phone message was left for the patient providing contact information and requesting a return call.   Abelino Derrick, MHA [facility]  Managed [facility] Social Worker [phone]

## 2023-04-04 ENCOUNTER — Encounter: Payer: Self-pay | Admitting: Family Medicine

## 2023-04-05 NOTE — Telephone Encounter (Signed)
Please advise patient asking for handicap sticker for himself.

## 2023-04-18 ENCOUNTER — Other Ambulatory Visit: Payer: Managed Care, Other (non HMO)
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
## 2023-04-18 NOTE — Patient Instructions (Signed)
Visit Information  The Patient                                              was given information about Medicaid Managed Care team care coordination services and consented to engagement with the Medicaid Managed Care team.   Social Worker will follow up in 30 days.   Cecile Guevara, BSW, MHA [place]  Managed Medicaid Social Worker [phone]  

## 2023-05-21 ENCOUNTER — Emergency Department (HOSPITAL_COMMUNITY)
Admission: EM | Admit: 2023-05-21 | Discharge: 2023-05-22 | Disposition: A | Discharge status: Home / Self Care | Payer: Managed Care, Other (non HMO) | Source: Home / Self Care | Attending: Emergency Medicine | Admitting: Emergency Medicine

## 2023-05-21 ENCOUNTER — Other Ambulatory Visit: Payer: Managed Care, Other (non HMO)

## 2023-05-21 ENCOUNTER — Emergency Department (HOSPITAL_COMMUNITY): Payer: Managed Care, Other (non HMO)

## 2023-05-21 ENCOUNTER — Other Ambulatory Visit: Payer: Self-pay

## 2023-05-21 ENCOUNTER — Encounter (HOSPITAL_COMMUNITY): Payer: Self-pay

## 2023-05-21 DIAGNOSIS — D649 Anemia, unspecified: Secondary | ICD-10-CM | POA: Diagnosis not present

## 2023-05-21 DIAGNOSIS — E860 Dehydration: Secondary | ICD-10-CM | POA: Diagnosis not present

## 2023-05-21 DIAGNOSIS — E440 Moderate protein-calorie malnutrition: Secondary | ICD-10-CM | POA: Diagnosis not present

## 2023-05-21 DIAGNOSIS — G8929 Other chronic pain: Secondary | ICD-10-CM | POA: Insufficient documentation

## 2023-05-21 DIAGNOSIS — F12188 Cannabis abuse with other cannabis-induced disorder: Secondary | ICD-10-CM | POA: Diagnosis not present

## 2023-05-21 DIAGNOSIS — Z1152 Encounter for screening for COVID-19: Secondary | ICD-10-CM | POA: Diagnosis not present

## 2023-05-21 DIAGNOSIS — N179 Acute kidney failure, unspecified: Secondary | ICD-10-CM | POA: Diagnosis not present

## 2023-05-21 DIAGNOSIS — R112 Nausea with vomiting, unspecified: Secondary | ICD-10-CM | POA: Insufficient documentation

## 2023-05-21 DIAGNOSIS — Z91010 Allergy to peanuts: Secondary | ICD-10-CM | POA: Insufficient documentation

## 2023-05-21 DIAGNOSIS — E1065 Type 1 diabetes mellitus with hyperglycemia: Secondary | ICD-10-CM | POA: Diagnosis not present

## 2023-05-21 DIAGNOSIS — E8889 Other specified metabolic disorders: Secondary | ICD-10-CM | POA: Diagnosis not present

## 2023-05-21 DIAGNOSIS — R17 Unspecified jaundice: Secondary | ICD-10-CM | POA: Diagnosis not present

## 2023-05-21 DIAGNOSIS — E109 Type 1 diabetes mellitus without complications: Secondary | ICD-10-CM | POA: Insufficient documentation

## 2023-05-21 DIAGNOSIS — E8809 Other disorders of plasma-protein metabolism, not elsewhere classified: Secondary | ICD-10-CM | POA: Diagnosis not present

## 2023-05-21 DIAGNOSIS — J302 Other seasonal allergic rhinitis: Secondary | ICD-10-CM | POA: Diagnosis not present

## 2023-05-21 DIAGNOSIS — R109 Unspecified abdominal pain: Secondary | ICD-10-CM | POA: Insufficient documentation

## 2023-05-21 DIAGNOSIS — R002 Palpitations: Secondary | ICD-10-CM | POA: Insufficient documentation

## 2023-05-21 DIAGNOSIS — Z794 Long term (current) use of insulin: Secondary | ICD-10-CM | POA: Diagnosis not present

## 2023-05-21 DIAGNOSIS — E871 Hypo-osmolality and hyponatremia: Secondary | ICD-10-CM | POA: Diagnosis not present

## 2023-05-21 DIAGNOSIS — M549 Dorsalgia, unspecified: Secondary | ICD-10-CM | POA: Diagnosis not present

## 2023-05-21 DIAGNOSIS — Z681 Body mass index (BMI) 19 or less, adult: Secondary | ICD-10-CM | POA: Diagnosis not present

## 2023-05-21 LAB — CBC WITH DIFFERENTIAL/PLATELET
Abs Immature Granulocytes: 0.03 10*3/uL (ref 0.00–0.07)
Basophils Absolute: 0 10*3/uL (ref 0.0–0.1)
Basophils Relative: 0 %
Eosinophils Absolute: 0 10*3/uL (ref 0.0–0.5)
Eosinophils Relative: 0 %
HCT: 40.5 % (ref 39.0–52.0)
Hemoglobin: 13 g/dL (ref 13.0–17.0)
Immature Granulocytes: 0 %
Lymphocytes Relative: 17 %
Lymphs Abs: 1.4 10*3/uL (ref 0.7–4.0)
MCH: 27.4 pg (ref 26.0–34.0)
MCHC: 32.1 g/dL (ref 30.0–36.0)
MCV: 85.3 fL (ref 80.0–100.0)
Monocytes Absolute: 0.6 10*3/uL (ref 0.1–1.0)
Monocytes Relative: 7 %
Neutro Abs: 6 10*3/uL (ref 1.7–7.7)
Neutrophils Relative %: 76 %
Platelets: 313 10*3/uL (ref 150–400)
RBC: 4.75 MIL/uL (ref 4.22–5.81)
RDW: 12.9 % (ref 11.5–15.5)
WBC: 8 10*3/uL (ref 4.0–10.5)
nRBC: 0 % (ref 0.0–0.2)

## 2023-05-21 LAB — URINALYSIS, ROUTINE W REFLEX MICROSCOPIC
Bacteria, UA: NONE SEEN
Bilirubin Urine: NEGATIVE
Glucose, UA: >=500 mg/dL — AB
Hgb urine dipstick: NEGATIVE
Ketones, ur: 80 mg/dL — AB
Leukocytes,Ua: NEGATIVE
Nitrite: NEGATIVE
Protein, ur: 30 mg/dL — AB
Specific Gravity, Urine: 1.027 (ref 1.005–1.030)
pH: 6 (ref 5.0–8.0)

## 2023-05-21 LAB — COMPREHENSIVE METABOLIC PANEL
ALT: 17 U/L (ref 0–44)
AST: 20 U/L (ref 15–41)
Albumin: 3.9 g/dL (ref 3.5–5.0)
Alkaline Phosphatase: 42 U/L (ref 38–126)
Anion gap: 20 — ABNORMAL HIGH (ref 5–15)
BUN: 24 mg/dL — ABNORMAL HIGH (ref 6–20)
CO2: 22 mmol/L (ref 22–32)
Calcium: 9.1 mg/dL (ref 8.9–10.3)
Chloride: 95 mmol/L — ABNORMAL LOW (ref 98–111)
Creatinine, Ser: 0.93 mg/dL (ref 0.61–1.24)
GFR, Estimated: >60 mL/min (ref >60)
Glucose, Bld: 263 mg/dL — ABNORMAL HIGH (ref 70–99)
Potassium: 3.9 mmol/L (ref 3.5–5.1)
Sodium: 137 mmol/L (ref 135–145)
Total Bilirubin: 2.1 mg/dL — ABNORMAL HIGH (ref 0.3–1.2)
Total Protein: 6.5 g/dL (ref 6.5–8.1)

## 2023-05-21 LAB — I-STAT VENOUS BLOOD GAS, ED
Acid-Base Excess: 3 mmol/L — ABNORMAL HIGH (ref 0.0–2.0)
Bicarbonate: 24 mmol/L (ref 20.0–28.0)
Calcium, Ion: 0.8 mmol/L — CL (ref 1.15–1.40)
HCT: 40 % (ref 39.0–52.0)
Hemoglobin: 13.6 g/dL (ref 13.0–17.0)
O2 Saturation: 89 %
Patient temperature: 98.8
Potassium: 3.8 mmol/L (ref 3.5–5.1)
Sodium: 134 mmol/L — ABNORMAL LOW (ref 135–145)
TCO2: 25 mmol/L (ref 22–32)
pCO2, Ven: 27 mmHg — ABNORMAL LOW (ref 44–60)
pH, Ven: 7.557 — ABNORMAL HIGH (ref 7.25–7.43)
pO2, Ven: 47 mmHg — ABNORMAL HIGH (ref 32–45)

## 2023-05-21 LAB — LIPASE, BLOOD: Lipase: 25 U/L (ref 11–51)

## 2023-05-21 LAB — TROPONIN I (HIGH SENSITIVITY)
Troponin I (High Sensitivity): 5 ng/L (ref <18)
Troponin I (High Sensitivity): 4 ng/L (ref <18)

## 2023-05-21 LAB — RESP PANEL BY RT-PCR (RSV, FLU A&B, COVID)  RVPGX2
Influenza A by PCR: NEGATIVE
Influenza B by PCR: NEGATIVE
Resp Syncytial Virus by PCR: NEGATIVE
SARS Coronavirus 2 by RT PCR: NEGATIVE

## 2023-05-21 LAB — BETA-HYDROXYBUTYRIC ACID: Beta-Hydroxybutyric Acid: 4.35 mmol/L — ABNORMAL HIGH (ref 0.05–0.27)

## 2023-05-21 LAB — CBG MONITORING, ED
Glucose-Capillary: 238 mg/dL — ABNORMAL HIGH (ref 70–99)
Glucose-Capillary: 292 mg/dL — ABNORMAL HIGH (ref 70–99)

## 2023-05-21 LAB — MAGNESIUM: Magnesium: 2.1 mg/dL (ref 1.7–2.4)

## 2023-05-21 MED ORDER — ONDANSETRON HCL 4 MG/2ML IJ SOLN
4.0000 mg | Freq: Once | INTRAMUSCULAR | Status: AC
  Administered 2023-05-21: 4 mg via INTRAVENOUS
  Filled 2023-05-21: qty 2

## 2023-05-21 MED ORDER — ONDANSETRON HCL 4 MG/2ML IJ SOLN
4.0000 mg | Freq: Once | INTRAMUSCULAR | Status: AC
  Administered 2023-05-22: 4 mg via INTRAVENOUS
  Filled 2023-05-21: qty 2

## 2023-05-21 MED ORDER — LACTATED RINGERS IV BOLUS
2000.0000 mL | Freq: Once | INTRAVENOUS | Status: AC
  Administered 2023-05-21: 2000 mL via INTRAVENOUS

## 2023-05-21 NOTE — Patient Instructions (Signed)
  Medicaid Managed Care   Unsuccessful Outreach Note  05/21/2023 Name: Edward Walls MRN: [id number] DOB: September 01, 1981  Referred by: Mliss Sax, MD Reason for referral : High Risk Managed Medicaid (MM social work Unsuccessful telephone outreach)   An unsuccessful telephone outreach was attempted today. The patient was referred to the case management team for assistance with care management and care coordination.   Follow Up Plan: The care management team will reach out to the patient again over the next 7-10 days.   Abelino Derrick, MHA [facility]  Managed [facility] Social Worker [phone]

## 2023-05-21 NOTE — ED Triage Notes (Signed)
Pt to ED via EMS from home. Pt c/o CP, SOB, N/V, chills x3 days. Pt endorses diffuse abdominal pain as well. Pt c/o ongoing GI issues x1 year that he has been seeing a specialist for out pt. Pt has hx of DM. Pt reports blood sugars at home have been high.    EMS Vitals:  281 CBG 140/70 80 HR 100% RA 22 LAC 4 Zofran

## 2023-05-21 NOTE — ED Provider Notes (Signed)
[place] EMERGENCY DEPARTMENT AT [facility] Provider Note   CSN: [id number] Arrival date & time: 05/21/23  1335     History {Add pertinent medical, surgical, social history, OB history to HPI:1} Chief Complaint  Patient presents with   Chest Pain   Nausea    Edward Walls is a 42 y.o. male for evaluation of nausea and vomiting as well as palpitations.  He has had multiple episodes of NBNB emesis over the last 24 hours.  He is compliant with his insulin for his diabetes however states his blood sugars have read "high" at home.  No chest pain, shortness of breath.  He denies any EtOH use, marijuana use.  He was concerned about DKA and presented here for evaluation, no abdominal pain, back pain, dysuria, hematuria.  No cough.  No fever.  States he has had chronic nausea and vomiting as well as abdominal spasms which she is followed by [facility] for.  He has had multiple imaging studies.  Diagnosed with SIBO.  HPI     Home Medications Prior to Admission medications   Medication Sig Start Date End Date Taking? Authorizing Provider  Continuous Glucose Sensor (DEXCOM G6 SENSOR) MISC Replace every 10 days 03/02/23   Carlus Pavlov, MD  Continuous Glucose Transmitter (DEXCOM G6 TRANSMITTER) MISC 1 Device by Does not apply route every 3 (three) months. 03/02/23   Carlus Pavlov, MD  gabapentin (NEURONTIN) 300 MG capsule Take 2 capsules (600 mg total) by mouth 3 (three) times daily. 06/27/22   Levert Feinstein, MD  insulin aspart (NOVOLOG) 100 UNIT/ML FlexPen Inject 10 Units into the skin 3 (three) times daily with meals. 01/29/23 04/29/23  [name], [name], DO  Insulin Disposable Pump (OMNIPOD 5 G6 INTRO, GEN 5,) KIT 1 each by Does not apply route as needed. Patient not taking: Reported on 04/02/2023 02/20/22   Carlus Pavlov, MD  insulin glargine (LANTUS) 100 UNIT/ML Solostar Pen Inject 30 Units into the skin daily. 01/29/23 04/29/23  [name], [name], DO  Insulin Pen Needle 32G  X 4 MM MISC Use 4-5x a day 05/26/22   Marguerita Merles Latif, DO  lidocaine (LIDODERM) 5 % Place 1 patch onto the skin daily. Remove & Discard patch within 12 hours or as directed by MD 01/30/23   [name], [name], DO  metoCLOPramide (REGLAN) 10 MG tablet Take 1 tablet (10 mg total) by mouth 3 (three) times daily with meals for 7 days, THEN 1 tablet (10 mg total) every 8 (eight) hours as needed for up to 7 days for nausea. 01/29/23 04/02/23  [name], [name], DO  ondansetron (ZOFRAN) 4 MG tablet Take 1 tablet (4 mg total) by mouth every 6 (six) hours as needed for nausea or vomiting. 12/29/22   [name], MD      Allergies    Other and Peanut-containing drug products    Review of Systems   Review of Systems  Constitutional: Negative.   HENT: Negative.  Negative for congestion.   Respiratory: Negative.    Cardiovascular:  Positive for palpitations. Negative for chest pain and leg swelling.  Gastrointestinal:  Positive for nausea and vomiting. Negative for abdominal distention, abdominal pain, anal bleeding, blood in stool, constipation, diarrhea and rectal pain.  Genitourinary: Negative.   Musculoskeletal: Negative.   Skin: Negative.   Neurological: Negative.   All other systems reviewed and are negative.   Physical Exam Updated Vital Signs BP (!) 146/94   Pulse 79   Temp 98.8 F (  37.1 C) (Oral)   Resp 14   Ht 6\' 3"  (1.905 m)   Wt 65.2 kg   SpO2 100%   BMI 17.97 kg/m  Physical Exam Vitals and nursing note reviewed.  Constitutional:      General: He is not in acute distress.    Appearance: He is well-developed. He is not ill-appearing, toxic-appearing or diaphoretic.  HENT:     Head: Atraumatic.  Eyes:     Pupils: Pupils are equal, round, and reactive to light.  Cardiovascular:     Rate and Rhythm: Normal rate and regular rhythm.     Pulses:          Radial pulses are 2+ on the right side and 2+ on the left side.       Dorsalis pedis pulses are 2+ on the right side and 2+  on the left side.     Heart sounds: Normal heart sounds.  Pulmonary:     Effort: Pulmonary effort is normal. No respiratory distress.     Breath sounds: Normal breath sounds.  Abdominal:     General: There is no distension.     Palpations: Abdomen is soft.  Musculoskeletal:        General: Normal range of motion.     Cervical back: Normal range of motion and neck supple.     Right lower leg: No tenderness. No edema.     Left lower leg: No tenderness. No edema.  Skin:    General: Skin is warm and dry.     Capillary Refill: Capillary refill takes less than 2 seconds.  Neurological:     General: No focal deficit present.     Mental Status: He is alert and oriented to person, place, and time.     ED Results / Procedures / Treatments   Labs (all labs ordered are listed, but only abnormal results are displayed) Labs Reviewed  COMPREHENSIVE METABOLIC PANEL - Abnormal; Notable for the following components:      Result Value   Chloride 95 (*)    Glucose, Bld 263 (*)    BUN 24 (*)    Total Bilirubin 2.1 (*)    Anion gap 20 (*)    All other components within normal limits  URINALYSIS, ROUTINE W REFLEX MICROSCOPIC - Abnormal; Notable for the following components:   Glucose, UA >=500 (*)    Ketones, ur 80 (*)    Protein, ur 30 (*)    All other components within normal limits  CBG MONITORING, ED - Abnormal; Notable for the following components:   Glucose-Capillary 238 (*)    All other components within normal limits  CBG MONITORING, ED - Abnormal; Notable for the following components:   Glucose-Capillary 292 (*)    All other components within normal limits  RESP PANEL BY RT-PCR (RSV, FLU A&B, COVID)  RVPGX2  CBC WITH DIFFERENTIAL/PLATELET  LIPASE, BLOOD  BETA-HYDROXYBUTYRIC ACID  MAGNESIUM  I-STAT VENOUS BLOOD GAS, ED  TROPONIN I (HIGH SENSITIVITY)  TROPONIN I (HIGH SENSITIVITY)    EKG EKG Interpretation Date/Time:  Monday May 21 2023 13:43:12 EDT Ventricular Rate:   88 PR Interval:  104 QRS Duration:  82 QT Interval:  358 QTC Calculation: 433 R Axis:   90  Text Interpretation: Sinus rhythm with short PR Rightward axis Minimal voltage criteria for LVH, may be normal variant ( Sokolow-Lyon ) Nonspecific T wave abnormality Abnormal ECG When compared with ECG of 26-Mar-2023 14:01, PREVIOUS ECG IS PRESENT Confirmed by Elayne Snare (  [id number]) on 05/21/2023 6:20:52 PM  Radiology DG Chest 1 View  Result Date: 05/21/2023 CLINICAL DATA:  Chest pain, shortness of breath.  Flutter. EXAM: CHEST  1 VIEW COMPARISON:  March 26, 2023. FINDINGS: The heart size and mediastinal contours are within normal limits. Both lungs are clear. The visualized skeletal structures are unremarkable. IMPRESSION: No active disease. Electronically Signed   By: Lupita Raider M.D.   On: 05/21/2023 15:40    Procedures Procedures  {Document cardiac monitor, telemetry assessment procedure when appropriate:1}  Medications Ordered in ED Medications  lactated ringers bolus 2,000 mL (2,000 mLs Intravenous New Bag/Given 05/21/23 1808)  ondansetron (ZOFRAN) injection 4 mg (4 mg Intravenous Given 05/21/23 1940)    ED Course/ Medical Decision Making/ A&P   {   Click here for ABCD2, HEART and other calculatorsREFRESH Note before signing :1}                              Medical Decision Making Amount and/or Complexity of Data Reviewed Labs: ordered.  Risk Prescription drug management.   ***  {Document critical care time when appropriate:1} {Document review of labs and clinical decision tools ie heart score, Chads2Vasc2 etc:1}  {Document your independent review of radiology images, and any outside records:1} {Document your discussion with family members, caretakers, and with consultants:1} {Document social determinants of health affecting pt's care:1} {Document your decision making why or why not admission, treatments were needed:1} Final Clinical Impression(s) / ED Diagnoses Final  diagnoses:  None    Rx / DC Orders ED Discharge Orders     None

## 2023-05-21 NOTE — ED Provider Triage Note (Signed)
Emergency Medicine Provider Triage Evaluation Note  Edward Walls , a 42 y.o. male  was evaluated in triage.  Pt complains of nausea and vomiting, heart fluttering for the past 3 days.  States he is having about 1 episode of emesis every hour.  He does take insulin for diabetes but has not had any changes to this medication, reports his blood sugars have been high at home.  Review of Systems  Positive: As above Negative: As above  Physical Exam  BP (!) 156/99 (BP Location: Right Arm)   Pulse 92   Temp 99.6 F (37.6 C) (Oral)   Resp 13   SpO2 100%  Gen:   Awake, no distress, nauseous appearing  Resp:  Normal effort  MSK:   Moves extremities without difficulty    Medical Decision Making  Medically screening exam initiated at 1:45 PM.  Appropriate orders placed.  Edward Walls was informed that the remainder of the evaluation will be completed by another provider, this initial triage assessment does not replace that evaluation, and the importance of remaining in the ED until their evaluation is complete.     Arabella Merles, PA-C 05/21/23 1347

## 2023-05-21 NOTE — Patient Outreach (Signed)
  Medicaid Managed Care   Unsuccessful Outreach Note  05/21/2023 Name: Edward Walls MRN: [id number] DOB: September 01, 1981  Referred by: Mliss Sax, MD Reason for referral : High Risk Managed Medicaid (MM social work Unsuccessful telephone outreach)   An unsuccessful telephone outreach was attempted today. The patient was referred to the case management team for assistance with care management and care coordination.   Follow Up Plan: The care management team will reach out to the patient again over the next 7-10 days.   Abelino Derrick, MHA [facility]  Managed [facility] Social Worker [phone]

## 2023-05-22 ENCOUNTER — Inpatient Hospital Stay (HOSPITAL_COMMUNITY)
Admission: EM | Admit: 2023-05-22 | Discharge: 2023-05-26 | DRG: 897 | Disposition: A | Discharge status: Home / Self Care | Payer: Managed Care, Other (non HMO) | Attending: Internal Medicine | Admitting: Internal Medicine

## 2023-05-22 ENCOUNTER — Encounter (HOSPITAL_COMMUNITY): Payer: Self-pay

## 2023-05-22 ENCOUNTER — Other Ambulatory Visit: Payer: Self-pay

## 2023-05-22 ENCOUNTER — Emergency Department (HOSPITAL_COMMUNITY): Payer: Managed Care, Other (non HMO)

## 2023-05-22 DIAGNOSIS — Z79899 Other long term (current) drug therapy: Secondary | ICD-10-CM

## 2023-05-22 DIAGNOSIS — N179 Acute kidney failure, unspecified: Secondary | ICD-10-CM | POA: Diagnosis present

## 2023-05-22 DIAGNOSIS — G8929 Other chronic pain: Secondary | ICD-10-CM | POA: Diagnosis present

## 2023-05-22 DIAGNOSIS — J302 Other seasonal allergic rhinitis: Secondary | ICD-10-CM | POA: Diagnosis present

## 2023-05-22 DIAGNOSIS — E876 Hypokalemia: Secondary | ICD-10-CM | POA: Diagnosis present

## 2023-05-22 DIAGNOSIS — Z91018 Allergy to other foods: Secondary | ICD-10-CM

## 2023-05-22 DIAGNOSIS — Z794 Long term (current) use of insulin: Secondary | ICD-10-CM

## 2023-05-22 DIAGNOSIS — E860 Dehydration: Secondary | ICD-10-CM | POA: Diagnosis present

## 2023-05-22 DIAGNOSIS — Z87891 Personal history of nicotine dependence: Secondary | ICD-10-CM

## 2023-05-22 DIAGNOSIS — E1043 Type 1 diabetes mellitus with diabetic autonomic (poly)neuropathy: Secondary | ICD-10-CM

## 2023-05-22 DIAGNOSIS — Z681 Body mass index (BMI) 19 or less, adult: Secondary | ICD-10-CM

## 2023-05-22 DIAGNOSIS — R112 Nausea with vomiting, unspecified: Secondary | ICD-10-CM | POA: Diagnosis present

## 2023-05-22 DIAGNOSIS — F12188 Cannabis abuse with other cannabis-induced disorder: Principal | ICD-10-CM | POA: Diagnosis present

## 2023-05-22 DIAGNOSIS — Z91148 Patient's other noncompliance with medication regimen for other reason: Secondary | ICD-10-CM

## 2023-05-22 DIAGNOSIS — M549 Dorsalgia, unspecified: Secondary | ICD-10-CM | POA: Diagnosis present

## 2023-05-22 DIAGNOSIS — R739 Hyperglycemia, unspecified: Principal | ICD-10-CM

## 2023-05-22 DIAGNOSIS — E8809 Other disorders of plasma-protein metabolism, not elsewhere classified: Secondary | ICD-10-CM | POA: Diagnosis present

## 2023-05-22 DIAGNOSIS — E1065 Type 1 diabetes mellitus with hyperglycemia: Secondary | ICD-10-CM | POA: Diagnosis present

## 2023-05-22 DIAGNOSIS — D649 Anemia, unspecified: Secondary | ICD-10-CM | POA: Diagnosis not present

## 2023-05-22 DIAGNOSIS — Z1152 Encounter for screening for COVID-19: Secondary | ICD-10-CM

## 2023-05-22 DIAGNOSIS — E8889 Other specified metabolic disorders: Secondary | ICD-10-CM | POA: Diagnosis present

## 2023-05-22 DIAGNOSIS — R17 Unspecified jaundice: Secondary | ICD-10-CM | POA: Diagnosis not present

## 2023-05-22 DIAGNOSIS — E871 Hypo-osmolality and hyponatremia: Secondary | ICD-10-CM | POA: Diagnosis present

## 2023-05-22 DIAGNOSIS — E440 Moderate protein-calorie malnutrition: Secondary | ICD-10-CM | POA: Diagnosis present

## 2023-05-22 DIAGNOSIS — Z91010 Allergy to peanuts: Secondary | ICD-10-CM

## 2023-05-22 LAB — CBG MONITORING, ED
Glucose-Capillary: 224 mg/dL — ABNORMAL HIGH (ref 70–99)
Glucose-Capillary: 258 mg/dL — ABNORMAL HIGH (ref 70–99)
Glucose-Capillary: 274 mg/dL — ABNORMAL HIGH (ref 70–99)
Glucose-Capillary: 310 mg/dL — ABNORMAL HIGH (ref 70–99)
Glucose-Capillary: 344 mg/dL — ABNORMAL HIGH (ref 70–99)

## 2023-05-22 LAB — BASIC METABOLIC PANEL
Anion gap: 8 (ref 5–15)
Anion gap: 9 (ref 5–15)
BUN: 13 mg/dL (ref 6–20)
BUN: 14 mg/dL (ref 6–20)
CO2: 20 mmol/L — ABNORMAL LOW (ref 22–32)
CO2: 23 mmol/L (ref 22–32)
Calcium: 5.9 mg/dL — CL (ref 8.9–10.3)
Calcium: 7 mg/dL — ABNORMAL LOW (ref 8.9–10.3)
Chloride: 112 mmol/L — ABNORMAL HIGH (ref 98–111)
Chloride: 105 mmol/L (ref 98–111)
Creatinine, Ser: 0.48 mg/dL — ABNORMAL LOW (ref 0.61–1.24)
Creatinine, Ser: 0.61 mg/dL (ref 0.61–1.24)
GFR, Estimated: >60 mL/min (ref >60)
GFR, Estimated: >60 mL/min (ref >60)
Glucose, Bld: 224 mg/dL — ABNORMAL HIGH (ref 70–99)
Glucose, Bld: 257 mg/dL — ABNORMAL HIGH (ref 70–99)
Potassium: 3.2 mmol/L — ABNORMAL LOW (ref 3.5–5.1)
Potassium: 3.4 mmol/L — ABNORMAL LOW (ref 3.5–5.1)
Sodium: 140 mmol/L (ref 135–145)
Sodium: 137 mmol/L (ref 135–145)

## 2023-05-22 LAB — URINALYSIS, ROUTINE W REFLEX MICROSCOPIC
Bacteria, UA: NONE SEEN
Bilirubin Urine: NEGATIVE
Glucose, UA: >=500 mg/dL — AB
Hgb urine dipstick: NEGATIVE
Ketones, ur: 80 mg/dL — AB
Leukocytes,Ua: NEGATIVE
Nitrite: NEGATIVE
Protein, ur: NEGATIVE mg/dL
Specific Gravity, Urine: >1.046 — ABNORMAL HIGH (ref 1.005–1.030)
pH: 7 (ref 5.0–8.0)

## 2023-05-22 LAB — TROPONIN I (HIGH SENSITIVITY)
Troponin I (High Sensitivity): 4 ng/L (ref <18)
Troponin I (High Sensitivity): 3 ng/L (ref <18)

## 2023-05-22 LAB — CBC WITH DIFFERENTIAL/PLATELET
Abs Immature Granulocytes: 0.01 10*3/uL (ref 0.00–0.07)
Basophils Absolute: 0 10*3/uL (ref 0.0–0.1)
Basophils Relative: 0 %
Eosinophils Absolute: 0 10*3/uL (ref 0.0–0.5)
Eosinophils Relative: 0 %
HCT: 33.2 % — ABNORMAL LOW (ref 39.0–52.0)
Hemoglobin: 10.5 g/dL — ABNORMAL LOW (ref 13.0–17.0)
Immature Granulocytes: 0 %
Lymphocytes Relative: 27 %
Lymphs Abs: 1.7 10*3/uL (ref 0.7–4.0)
MCH: 27.3 pg (ref 26.0–34.0)
MCHC: 31.6 g/dL (ref 30.0–36.0)
MCV: 86.2 fL (ref 80.0–100.0)
Monocytes Absolute: 0.6 10*3/uL (ref 0.1–1.0)
Monocytes Relative: 9 %
Neutro Abs: 3.9 10*3/uL (ref 1.7–7.7)
Neutrophils Relative %: 64 %
Platelets: 220 10*3/uL (ref 150–400)
RBC: 3.85 MIL/uL — ABNORMAL LOW (ref 4.22–5.81)
RDW: 12.7 % (ref 11.5–15.5)
WBC: 6.2 10*3/uL (ref 4.0–10.5)
nRBC: 0 % (ref 0.0–0.2)

## 2023-05-22 LAB — BLOOD GAS, VENOUS
Acid-base deficit: 1.5 mmol/L (ref 0.0–2.0)
Bicarbonate: 24.3 mmol/L (ref 20.0–28.0)
O2 Saturation: 58 %
Patient temperature: 37
pCO2, Ven: 44 mmHg (ref 44–60)
pH, Ven: 7.35 (ref 7.25–7.43)
pO2, Ven: 34 mmHg (ref 32–45)

## 2023-05-22 LAB — RAPID URINE DRUG SCREEN, HOSP PERFORMED
Amphetamines: NOT DETECTED
Barbiturates: NOT DETECTED
Benzodiazepines: NOT DETECTED
Cocaine: NOT DETECTED
Opiates: NOT DETECTED
Tetrahydrocannabinol: POSITIVE — AB

## 2023-05-22 LAB — D-DIMER, QUANTITATIVE: D-Dimer, Quant: 0.33 ug/mL-FEU (ref 0.00–0.50)

## 2023-05-22 LAB — I-STAT CG4 LACTIC ACID, ED: Lactic Acid, Venous: 1.4 mmol/L (ref 0.5–1.9)

## 2023-05-22 LAB — LIPASE, BLOOD: Lipase: 30 U/L (ref 11–51)

## 2023-05-22 LAB — BETA-HYDROXYBUTYRIC ACID: Beta-Hydroxybutyric Acid: 1.66 mmol/L — ABNORMAL HIGH (ref 0.05–0.27)

## 2023-05-22 MED ORDER — LACTATED RINGERS IV BOLUS
1000.0000 mL | Freq: Once | INTRAVENOUS | Status: AC
  Administered 2023-05-22: 1000 mL via INTRAVENOUS

## 2023-05-22 MED ORDER — ONDANSETRON 4 MG PO TBDP: 4.0000 mg | ORAL_TABLET | Freq: Three times a day (TID) | ORAL | 0 refills | Status: DC | PRN

## 2023-05-22 MED ORDER — ACETAMINOPHEN 325 MG PO TABS: 650.0000 mg | ORAL_TABLET | Freq: Four times a day (QID) | ORAL | Status: DC | PRN

## 2023-05-22 MED ORDER — SODIUM CHLORIDE (PF) 0.9 % IJ SOLN
INTRAMUSCULAR | Status: AC
  Filled 2023-05-22: qty 50

## 2023-05-22 MED ORDER — ACETAMINOPHEN 650 MG RE SUPP: 650.0000 mg | Freq: Four times a day (QID) | RECTAL | Status: DC | PRN

## 2023-05-22 MED ORDER — PANTOPRAZOLE SODIUM 40 MG IV SOLR
40.0000 mg | Freq: Every day | INTRAVENOUS | Status: DC
  Administered 2023-05-22 – 2023-05-26 (×5): 40 mg via INTRAVENOUS
  Filled 2023-05-22 (×5): qty 10

## 2023-05-22 MED ORDER — CALCIUM GLUCONATE-NACL 1-0.675 GM/50ML-% IV SOLN
1.0000 g | Freq: Once | INTRAVENOUS | Status: AC
  Administered 2023-05-22: 1000 mg via INTRAVENOUS
  Filled 2023-05-22: qty 50

## 2023-05-22 MED ORDER — TRAZODONE HCL 50 MG PO TABS: 25.0000 mg | ORAL_TABLET | Freq: Every evening | ORAL | Status: DC | PRN

## 2023-05-22 MED ORDER — ONDANSETRON HCL 4 MG PO TABS: 4.0000 mg | ORAL_TABLET | Freq: Four times a day (QID) | ORAL | Status: DC | PRN

## 2023-05-22 MED ORDER — ALBUTEROL SULFATE (2.5 MG/3ML) 0.083% IN NEBU: 2.5000 mg | INHALATION_SOLUTION | RESPIRATORY_TRACT | Status: DC | PRN

## 2023-05-22 MED ORDER — INSULIN GLARGINE-YFGN 100 UNIT/ML ~~LOC~~ SOLN
20.0000 [IU] | Freq: Every day | SUBCUTANEOUS | Status: DC
  Administered 2023-05-22 – 2023-05-26 (×5): 20 [IU] via SUBCUTANEOUS
  Filled 2023-05-22 (×5): qty 0.2

## 2023-05-22 MED ORDER — ONDANSETRON HCL 4 MG/2ML IJ SOLN
4.0000 mg | Freq: Once | INTRAMUSCULAR | Status: AC
  Administered 2023-05-22: 4 mg via INTRAVENOUS
  Filled 2023-05-22: qty 2

## 2023-05-22 MED ORDER — IOHEXOL 300 MG/ML  SOLN
100.0000 mL | Freq: Once | INTRAMUSCULAR | Status: AC | PRN
  Administered 2023-05-22: 100 mL via INTRAVENOUS

## 2023-05-22 MED ORDER — RIFAXIMIN 550 MG PO TABS
550.0000 mg | ORAL_TABLET | Freq: Three times a day (TID) | ORAL | Status: DC
  Administered 2023-05-22 – 2023-05-26 (×11): 550 mg via ORAL
  Filled 2023-05-22 (×13): qty 1

## 2023-05-22 MED ORDER — ENOXAPARIN SODIUM 40 MG/0.4ML IJ SOSY
40.0000 mg | PREFILLED_SYRINGE | INTRAMUSCULAR | Status: DC
  Administered 2023-05-22 – 2023-05-25 (×4): 40 mg via SUBCUTANEOUS
  Filled 2023-05-22 (×4): qty 0.4

## 2023-05-22 MED ORDER — DROPERIDOL 2.5 MG/ML IJ SOLN
2.5000 mg | Freq: Once | INTRAMUSCULAR | Status: AC
  Administered 2023-05-22: 2.5 mg via INTRAVENOUS
  Filled 2023-05-22: qty 2

## 2023-05-22 MED ORDER — POTASSIUM CHLORIDE IN NACL 20-0.9 MEQ/L-% IV SOLN
INTRAVENOUS | Status: DC
  Filled 2023-05-22 (×8): qty 1000

## 2023-05-22 MED ORDER — INSULIN ASPART 100 UNIT/ML IJ SOLN
0.0000 [IU] | Freq: Every day | INTRAMUSCULAR | Status: DC
  Administered 2023-05-22: 2 [IU] via SUBCUTANEOUS
  Filled 2023-05-22: qty 0.05

## 2023-05-22 MED ORDER — HYDROMORPHONE HCL 1 MG/ML IJ SOLN: 1.0000 mg | INTRAMUSCULAR | Status: DC | PRN

## 2023-05-22 MED ORDER — ESCITALOPRAM OXALATE 20 MG PO TABS
20.0000 mg | ORAL_TABLET | Freq: Every day | ORAL | Status: DC
  Administered 2023-05-22 – 2023-05-26 (×5): 20 mg via ORAL
  Filled 2023-05-22: qty 1
  Filled 2023-05-22: qty 2
  Filled 2023-05-22 (×3): qty 1

## 2023-05-22 MED ORDER — INSULIN ASPART 100 UNIT/ML IJ SOLN
0.0000 [IU] | Freq: Three times a day (TID) | INTRAMUSCULAR | Status: DC
  Administered 2023-05-22: 8 [IU] via SUBCUTANEOUS
  Administered 2023-05-23: 5 [IU] via SUBCUTANEOUS
  Administered 2023-05-23: 2 [IU] via SUBCUTANEOUS
  Administered 2023-05-24: 3 [IU] via SUBCUTANEOUS
  Administered 2023-05-24 (×2): 2 [IU] via SUBCUTANEOUS
  Administered 2023-05-25 (×2): 3 [IU] via SUBCUTANEOUS
  Administered 2023-05-26: 2 [IU] via SUBCUTANEOUS
  Filled 2023-05-22: qty 0.15

## 2023-05-22 MED ORDER — ONDANSETRON HCL 4 MG/2ML IJ SOLN
4.0000 mg | Freq: Four times a day (QID) | INTRAMUSCULAR | Status: DC | PRN
  Administered 2023-05-23 – 2023-05-25 (×3): 4 mg via INTRAVENOUS
  Filled 2023-05-22 (×5): qty 2

## 2023-05-22 MED ORDER — FENTANYL CITRATE PF 50 MCG/ML IJ SOSY
50.0000 ug | PREFILLED_SYRINGE | Freq: Once | INTRAMUSCULAR | Status: AC
  Administered 2023-05-22: 50 ug via INTRAVENOUS
  Filled 2023-05-22: qty 1

## 2023-05-22 NOTE — ED Provider Notes (Incomplete)
[facility] DEPARTMENT AT [facility] Provider Note   CSN: [id number] Arrival date & time: 05/21/23  1335     History {Add pertinent medical, surgical, social history, OB history to HPI:1} Chief Complaint  Patient presents with  . Chest Pain  . Nausea    Edward Walls is a 42 y.o. male for evaluation of nausea and vomiting as well as palpitations.  He has had multiple episodes of NBNB emesis over the last 24 hours.  He is compliant with his insulin for his diabetes however states his blood sugars have read "high" at home.  No chest pain, shortness of breath.  He denies any EtOH use, marijuana use.  He was concerned about DKA and presented here for evaluation, no abdominal pain, back pain, dysuria, hematuria.  No cough.  No fever.  States he has had chronic nausea and vomiting as well as abdominal spasms which she is followed by [facility] for.  He has had multiple imaging studies.  Diagnosed with SIBO.  HPI     Home Medications Prior to Admission medications   Medication Sig Start Date End Date Taking? Authorizing Provider  Continuous Glucose Sensor (DEXCOM G6 SENSOR) MISC Replace every 10 days 03/02/23   Carlus Pavlov, MD  Continuous Glucose Transmitter (DEXCOM G6 TRANSMITTER) MISC 1 Device by Does not apply route every 3 (three) months. 03/02/23   Carlus Pavlov, MD  gabapentin (NEURONTIN) 300 MG capsule Take 2 capsules (600 mg total) by mouth 3 (three) times daily. 06/27/22   Levert Feinstein, MD  insulin aspart (NOVOLOG) 100 UNIT/ML FlexPen Inject 10 Units into the skin 3 (three) times daily with meals. 01/29/23 04/29/23  [name], [name], DO  Insulin Disposable Pump (OMNIPOD 5 G6 INTRO, GEN 5,) KIT 1 each by Does not apply route as needed. Patient not taking: Reported on 04/02/2023 02/20/22   Carlus Pavlov, MD  insulin glargine (LANTUS) 100 UNIT/ML Solostar Pen Inject 30 Units into the skin daily. 01/29/23 04/29/23  [name], [name], DO  Insulin Pen Needle  32G X 4 MM MISC Use 4-5x a day 05/26/22   Marguerita Merles Latif, DO  lidocaine (LIDODERM) 5 % Place 1 patch onto the skin daily. Remove & Discard patch within 12 hours or as directed by MD 01/30/23   [name], [name], DO  metoCLOPramide (REGLAN) 10 MG tablet Take 1 tablet (10 mg total) by mouth 3 (three) times daily with meals for 7 days, THEN 1 tablet (10 mg total) every 8 (eight) hours as needed for up to 7 days for nausea. 01/29/23 04/02/23  [name], [name], DO  ondansetron (ZOFRAN) 4 MG tablet Take 1 tablet (4 mg total) by mouth every 6 (six) hours as needed for nausea or vomiting. 12/29/22   [name], MD      Allergies    Other and Peanut-containing drug products    Review of Systems   Review of Systems  Constitutional: Negative.   HENT: Negative.  Negative for congestion.   Respiratory: Negative.    Cardiovascular:  Positive for palpitations. Negative for chest pain and leg swelling.  Gastrointestinal:  Positive for nausea and vomiting. Negative for abdominal distention, abdominal pain, anal bleeding, blood in stool, constipation, diarrhea and rectal pain.  Genitourinary: Negative.   Musculoskeletal: Negative.   Skin: Negative.   Neurological: Negative.   All other systems reviewed and are negative.   Physical Exam Updated Vital Signs BP (!) 146/94   Pulse 79   Temp 98.8 F (  37.1 C) (Oral)   Resp 14   Ht 6\' 3"  (1.905 m)   Wt 65.2 kg   SpO2 100%   BMI 17.97 kg/m  Physical Exam Vitals and nursing note reviewed.  Constitutional:      General: He is not in acute distress.    Appearance: He is well-developed. He is not ill-appearing, toxic-appearing or diaphoretic.  HENT:     Head: Atraumatic.  Eyes:     Pupils: Pupils are equal, round, and reactive to light.  Cardiovascular:     Rate and Rhythm: Normal rate and regular rhythm.     Pulses:          Radial pulses are 2+ on the right side and 2+ on the left side.       Dorsalis pedis pulses are 2+ on the right side and  2+ on the left side.     Heart sounds: Normal heart sounds.  Pulmonary:     Effort: Pulmonary effort is normal. No respiratory distress.     Breath sounds: Normal breath sounds.  Abdominal:     General: There is no distension.     Palpations: Abdomen is soft.  Musculoskeletal:        General: Normal range of motion.     Cervical back: Normal range of motion and neck supple.     Right lower leg: No tenderness. No edema.     Left lower leg: No tenderness. No edema.  Skin:    General: Skin is warm and dry.     Capillary Refill: Capillary refill takes less than 2 seconds.  Neurological:     General: No focal deficit present.     Mental Status: He is alert and oriented to person, place, and time.     ED Results / Procedures / Treatments   Labs (all labs ordered are listed, but only abnormal results are displayed) Labs Reviewed  COMPREHENSIVE METABOLIC PANEL - Abnormal; Notable for the following components:      Result Value   Chloride 95 (*)    Glucose, Bld 263 (*)    BUN 24 (*)    Total Bilirubin 2.1 (*)    Anion gap 20 (*)    All other components within normal limits  URINALYSIS, ROUTINE W REFLEX MICROSCOPIC - Abnormal; Notable for the following components:   Glucose, UA >=500 (*)    Ketones, ur 80 (*)    Protein, ur 30 (*)    All other components within normal limits  CBG MONITORING, ED - Abnormal; Notable for the following components:   Glucose-Capillary 238 (*)    All other components within normal limits  CBG MONITORING, ED - Abnormal; Notable for the following components:   Glucose-Capillary 292 (*)    All other components within normal limits  RESP PANEL BY RT-PCR (RSV, FLU A&B, COVID)  RVPGX2  CBC WITH DIFFERENTIAL/PLATELET  LIPASE, BLOOD  BETA-HYDROXYBUTYRIC ACID  MAGNESIUM  I-STAT VENOUS BLOOD GAS, ED  TROPONIN I (HIGH SENSITIVITY)  TROPONIN I (HIGH SENSITIVITY)    EKG EKG Interpretation Date/Time:  Monday May 21 2023 13:43:12 EDT Ventricular Rate:   88 PR Interval:  104 QRS Duration:  82 QT Interval:  358 QTC Calculation: 433 R Axis:   90  Text Interpretation: Sinus rhythm with short PR Rightward axis Minimal voltage criteria for LVH, may be normal variant ( Sokolow-Lyon ) Nonspecific T wave abnormality Abnormal ECG When compared with ECG of 26-Mar-2023 14:01, PREVIOUS ECG IS PRESENT Confirmed by Elayne Snare (  [id number]) on 05/21/2023 6:20:52 PM  Radiology DG Chest 1 View  Result Date: 05/21/2023 CLINICAL DATA:  Chest pain, shortness of breath.  Flutter. EXAM: CHEST  1 VIEW COMPARISON:  March 26, 2023. FINDINGS: The heart size and mediastinal contours are within normal limits. Both lungs are clear. The visualized skeletal structures are unremarkable. IMPRESSION: No active disease. Electronically Signed   By: Lupita Raider M.D.   On: 05/21/2023 15:40    Procedures Procedures  {Document cardiac monitor, telemetry assessment procedure when appropriate:1}  Medications Ordered in ED Medications  lactated ringers bolus 2,000 mL (2,000 mLs Intravenous New Bag/Given 05/21/23 1808)  ondansetron (ZOFRAN) injection 4 mg (4 mg Intravenous Given 05/21/23 1940)    ED Course/ Medical Decision Making/ A&P   42 year old here for evaluation of nausea and vomiting.  Type I diabetic who has not been compliant with his medications.  Does have known history of marijuana use.  He has chronic abdominal pain followed by [facility].  Some palpitations were denies any chest pain or shortness of breath.  Plan on labs, imaging, IV fluids  CBC without leukocytosis Metabolic panel glucose 263, normal bicarb, T. bili 2.1, similar to prior, anion gap 20 Lipase 25 UA negative for infection does show ketones Troponin 4 VBG without acidosis, normal bicarb Mag 2.1  Patient given IV fluids, has some improvement.  Will plan on rechecking metabolic panel I suspect his elevated anion gap is likely due to dehydration, low suspicion for DKA.  His beta hydroxy is elevated he has  chronic elevation   {   Click here for ABCD2, HEART and other calculatorsREFRESH Note before signing :1}                              Medical Decision Making Amount and/or Complexity of Data Reviewed Labs: ordered.  Risk Prescription drug management.     {Document critical care time when appropriate:1} {Document review of labs and clinical decision tools ie heart score, Chads2Vasc2 etc:1}  {Document your independent review of radiology images, and any outside records:1} {Document your discussion with family members, caretakers, and with consultants:1} {Document social determinants of health affecting pt's care:1} {Document your decision making why or why not admission, treatments were needed:1} Final Clinical Impression(s) / ED Diagnoses Final diagnoses:  None    Rx / DC Orders ED Discharge Orders     None

## 2023-05-22 NOTE — H&P (Signed)
History and Physical  Edward Walls ZOX:[id number] DOB: Mar 14, 1981 DOA: 05/22/2023  PCP: Mliss Sax, MD   Chief Complaint: Nausea, abdominal pain  HPI: Edward Walls is a 42 y.o. male with medical history significant for type 1 diabetes, small intestinal bacterial overgrowth, marijuana use intermittent chronic nausea and vomiting being admitted to the hospital with recurrent abdominal discomfort, intractable nausea and vomiting for the last 3 to 4 days.  He has had multiple hospital admissions for intractable nausea vomiting, as well as DKA.  He presented to the emergency department on 8/5 with intractable nausea vomiting abdominal pain.  Was found to not be in DKA, was given IV fluids, supportive care felt better and discharged home.  However he came back this morning to [facility], ER with continued complaints of chest pain, intractable vomiting.  He denies any fevers or chills, cough, chest pain, shortness of breath.  No hematemesis or coffee-ground's.  Tells me that his pain is similar to prior bouts of intractable vomiting.  Last bowel movement was about 5 days ago.  ED Course: In the emergency department, vital signs are unremarkable.  CBC unremarkable except for hemoglobin of 10.5, which is a new finding for him.  Blood sugar this morning 310, otherwise BMP relatively unremarkable.  He was given a dose of IV calcium gluconate due to mild hypocalcemia.  Review of Systems: Please see HPI for pertinent positives and negatives. A complete 10 system review of systems are otherwise negative.  Past Medical History:  Diagnosis Date   Diabetes mellitus without complication (HCC)    Leg pain    Leg weakness    Seasonal allergies    Past Surgical History:  Procedure Laterality Date   DENTAL SURGERY     ESOPHAGOGASTRODUODENOSCOPY (EGD) WITH PROPOFOL N/A 10/27/2022   Procedure: ESOPHAGOGASTRODUODENOSCOPY (EGD) WITH PROPOFOL;  Surgeon: Sherrilyn Rist, MD;  Location: [facility]  ENDOSCOPY;  Service: Gastroenterology;  Laterality: N/A;    Social History:  reports that he has quit smoking. His smoking use included cigarettes. He has never used smokeless tobacco. He reports current alcohol use. He reports that he does not use drugs.   Allergies  Allergen Reactions   Other Other (See Comments)    All nuts Throat pain   Peanut-Containing Drug Products Other (See Comments)    Throat pain    Family History  Problem Relation Age of Onset   Diabetes Mellitus II Mother    Diabetes Mellitus II Father    Diabetes Mellitus II Brother      Prior to Admission medications   Medication Sig Start Date End Date Taking? Authorizing Provider  escitalopram (LEXAPRO) 20 MG tablet Take 20 mg by mouth daily. 04/27/23  Yes [provider]  gabapentin (NEURONTIN) 300 MG capsule Take 2 capsules (600 mg total) by mouth 3 (three) times daily. 06/27/22  Yes Levert Feinstein, MD  insulin aspart (NOVOLOG) 100 UNIT/ML FlexPen Inject 10 Units into the skin 3 (three) times daily with meals. 01/29/23 05/22/23 Yes [name], [name], DO  insulin glargine (LANTUS) 100 UNIT/ML Solostar Pen Inject 30 Units into the skin daily. 01/29/23 05/22/23 Yes [name], [name], DO  lidocaine (LIDODERM) 5 % Place 1 patch onto the skin daily. Remove & Discard patch within 12 hours or as directed by MD 01/30/23  Yes [name], [name], DO  metoCLOPramide (REGLAN) 10 MG tablet Take 1 tablet (10 mg total) by mouth 3 (three) times daily with meals for 7 days, THEN 1 tablet (10  mg total) every 8 (eight) hours as needed for up to 7 days for nausea. 01/29/23 05/22/23 Yes [name], [name], DO  ondansetron (ZOFRAN) 4 MG tablet Take 1 tablet (4 mg total) by mouth every 6 (six) hours as needed for nausea or vomiting. 12/29/22  Yes Countryman, Chase, MD  pantoprazole (PROTONIX) 40 MG tablet Take 40 mg by mouth 2 (two) times daily. 04/04/23  Yes [provider]  XIFAXAN 550 MG TABS tablet Take 550 mg by mouth 3 (three) times daily.  05/02/23  Yes [provider]  Continuous Glucose Sensor (DEXCOM G6 SENSOR) MISC Replace every 10 days 03/02/23   Carlus Pavlov, MD  Continuous Glucose Transmitter (DEXCOM G6 TRANSMITTER) MISC 1 Device by Does not apply route every 3 (three) months. 03/02/23   Carlus Pavlov, MD  Insulin Disposable Pump (OMNIPOD 5 G6 INTRO, GEN 5,) KIT 1 each by Does not apply route as needed. Patient not taking: Reported on 04/02/2023 02/20/22   Carlus Pavlov, MD  Insulin Pen Needle 32G X 4 MM MISC Use 4-5x a day 05/26/22   Marguerita Merles Latif, DO  ondansetron (ZOFRAN-ODT) 4 MG disintegrating tablet Take 1 tablet (4 mg total) by mouth every 8 (eight) hours as needed for nausea. Patient not taking: Reported on 05/22/2023 05/22/23   Garlon Hatchet, PA-C    Physical Exam: BP 137/81   Pulse 74   Temp 99.3 F (37.4 C) (Oral)   Resp 13   Ht 6\' 3"  (1.905 m)   Wt 65.2 kg   SpO2 100%   BMI 17.97 kg/m   General:  Alert, oriented, calm, in no acute distress, looks tired but not acutely ill and nontoxic Eyes: EOMI, clear conjuctivae, white sclerea Neck: supple, no masses, trachea mildline  Cardiovascular: RRR, no murmurs or rubs, no peripheral edema  Respiratory: clear to auscultation bilaterally, no wheezes, no crackles  Abdomen: soft, nontender, nondistended, normal bowel tones heard  Skin: dry, no rashes  Musculoskeletal: no joint effusions, normal range of motion  Psychiatric: appropriate affect, normal speech  Neurologic: extraocular muscles intact, clear speech, moving all extremities with intact sensorium          Labs on Admission:  Basic Metabolic Panel: Recent Labs  Lab 05/21/23 1347 05/21/23 1939 05/21/23 2145 05/22/23 0008 05/22/23 1200 05/22/23 1310  NA 137  --  134* 135 140 137  K 3.9  --  3.8 3.9 3.2* 3.4*  CL 95*  --   --  97* 112* 105  CO2 22  --   --  23 20* 23  GLUCOSE 263*  --   --  297* 224* 257*  BUN 24*  --   --  20 13 14   CREATININE 0.93  --   --  1.25* 0.48*  0.61  CALCIUM 9.1  --   --  8.7* 5.9* 7.0*  MG  --  2.1  --   --   --   --    Liver Function Tests: Recent Labs  Lab 05/21/23 1347 05/22/23 0008  AST 20 14*  ALT 17 18  ALKPHOS 42 40  BILITOT 2.1* 1.9*  PROT 6.5 5.9*  ALBUMIN 3.9 3.4*   Recent Labs  Lab 05/21/23 1347 05/22/23 0924  LIPASE 25 30   No results for input(s): "AMMONIA" in the last 168 hours. CBC: Recent Labs  Lab 05/21/23 1347 05/21/23 2145 05/22/23 1310  WBC 8.0  --  6.2  NEUTROABS 6.0  --  3.9  HGB 13.0 13.6 10.5*  HCT 40.5 40.0 33.2*  MCV 85.3  --  86.2  PLT 313  --  220   Cardiac Enzymes: No results for input(s): "CKTOTAL", "CKMB", "CKMBINDEX", "TROPONINI" in the last 168 hours.  BNP (last 3 results) No results for input(s): "BNP" in the last 8760 hours.  ProBNP (last 3 results) No results for input(s): "PROBNP" in the last 8760 hours.  CBG: Recent Labs  Lab 05/21/23 1346 05/21/23 1542 05/22/23 0031 05/22/23 0731 05/22/23 0929  GLUCAP 238* 292* 274* 344* 310*    Radiological Exams on Admission: CT ABDOMEN PELVIS W CONTRAST  Result Date: 05/22/2023 CLINICAL DATA:  Three day history of nausea and vomiting in the setting of hyperglycemia EXAM: CT ABDOMEN AND PELVIS WITH CONTRAST TECHNIQUE: Multidetector CT imaging of the abdomen and pelvis was performed using the standard protocol following bolus administration of intravenous contrast. RADIATION DOSE REDUCTION: This exam was performed according to the departmental dose-optimization program which includes automated exposure control, adjustment of the mA and/or kV according to patient size and/or use of iterative reconstruction technique. CONTRAST:  OMNIPAQUE IOHEXOL 300 MG/ML  SOLN COMPARISON:  CT abdomen and pelvis dated 10/25/2022 FINDINGS: Lower chest: No focal consolidation or pulmonary nodule in the lung bases. No pleural effusion or pneumothorax demonstrated. Partially imaged heart size is normal. Hepatobiliary: No focal hepatic  lesions. No intra or extrahepatic biliary ductal dilation. Normal gallbladder. Pancreas: No focal lesions or main ductal dilation. Spleen: Normal in size without focal abnormality. Adrenals/Urinary Tract: No adrenal nodules. No suspicious renal mass, calculi or hydronephrosis. No focal bladder wall thickening. Stomach/Bowel: Normal appearance of the stomach. No evidence of bowel wall thickening, distention, or inflammatory changes. Appendix is not discretely seen. Vascular/Lymphatic: No significant vascular findings are present. No enlarged abdominal or pelvic lymph nodes. Reproductive: Prostate is unremarkable. Other: No free fluid, fluid collection, or free air. Musculoskeletal: No acute or abnormal lytic or blastic osseous lesions. IMPRESSION: No acute abdominopelvic findings. Electronically Signed   By: Agustin Cree M.D.   On: 05/22/2023 09:15   DG Chest 1 View  Result Date: 05/21/2023 CLINICAL DATA:  Chest pain, shortness of breath.  Flutter. EXAM: CHEST  1 VIEW COMPARISON:  March 26, 2023. FINDINGS: The heart size and mediastinal contours are within normal limits. Both lungs are clear. The visualized skeletal structures are unremarkable. IMPRESSION: No active disease. Electronically Signed   By: Lupita Raider M.D.   On: 05/21/2023 15:40    Assessment/Plan Edward Walls is a 42 y.o. male with medical history significant for type 1 diabetes, small intestinal bacterial overgrowth, marijuana use intermittent chronic nausea and vomiting being admitted to the hospital with recurrent abdominal discomfort, intractable nausea and vomiting for the last 3 to 4 days.   Tractable nausea and vomiting-no evidence of acute process, especially considering CT scan with no acute findings.  Likely cannabinoid hyperemesis, versus gastroparesis. -Observation admission -Supportive care with IV fluids, pain and nausea medications as needed -Clear liquid diet  Type 1 diabetes-hyperglycemia, without evidence of  DKA -Carb controlled diet as it is advanced -Resume his home basal insulin at reduced dose -Moderate dose sliding scale insulin  Hypocalcemia-status post IV replacement in the ER  Anemia-no prior history of this, patient denies any melena or hematemesis -Check Hemoccult stool card -Recheck with morning labs, could be spurious  DVT prophylaxis: Lovenox     Code Status: Full Code  Consults called: None  Admission status: Observation  Time spent: 53 minutes  Eusevio Schriver Sharlette Dense MD [facility]  Pager [phone]  If 7PM-7AM, please contact night-coverage [email or web address] Password [email or web address]  05/22/2023, 3:35 PM

## 2023-05-22 NOTE — Discharge Instructions (Signed)
Take the prescribed medication as directed.  Keep a check on your sugars.  Make sure to hydrate well. Follow-up with your primary care doctor. Return to the ED for new or worsening symptoms.

## 2023-05-22 NOTE — ED Notes (Signed)
Pt provided sandwich bag and apple juice and encouraged to eat

## 2023-05-22 NOTE — ED Notes (Signed)
ED TO INPATIENT HANDOFF REPORT  Name/Age/Gender Edward Walls 42 y.o. male  Code Status    Code Status Orders  (From admission, onward)           Start     Ordered   05/22/23 1527  Full code  Continuous       Question:  By:  Answer:  Consent: discussion documented in EHR   05/22/23 1527           Code Status History     Date Active Date Inactive Code Status Order ID Comments User Context   01/27/2023 0745 01/29/2023 1713 Full Code [id number]  Maryln Gottron, MD ED   10/25/2022 1600 10/28/2022 2252 Full Code [id number]  Bobette Mo, MD ED   09/28/2022 0359 09/30/2022 0021 Full Code [id number]  Darlin Drop, DO ED   05/24/2022 1355 05/26/2022 2106 Full Code [id number]  Janeann Forehand D, NP ED   06/04/2020 1123 06/07/2020 2051 Full Code [id number]  Almon Hercules, MD ED   10/14/2018 1251 10/17/2018 1929 Full Code [id number]  [name], NP ED   03/14/2018 0953 03/15/2018 2210 Full Code [id number]  Narda Bonds, MD Inpatient   03/14/2018 0800 03/14/2018 0953 Full Code [id number]  Narda Bonds, MD ED   07/03/2017 1959 07/06/2017 1351 Full Code [id number]  Lorretta Harp, MD ED   10/20/2016 1712 10/22/2016 2001 Full Code [id number]  Jerald Kief, MD ED       Home/SNF/Other Home  Chief Complaint Intractable vomiting with nausea [R11.2]  Level of Care/Admitting Diagnosis ED Disposition     ED Disposition  Admit   Condition  --   Comment  Hospital Area: [facility] [[id number]]  Level of Care: Med-Surg [16]  May place patient in observation at [facility] or [facility] if equivalent level of care is available:: Yes  Covid Evaluation: Asymptomatic - no recent exposure (last 10 days) testing not required  Diagnosis: Intractable vomiting with nausea [1062694]  Admitting Physician: Maryln Gottron [[id number]]  Attending Physician: Kirby Crigler, MIR [email or web address] [[id number]]          Medical History Past Medical History:  Diagnosis Date   Diabetes mellitus  without complication (HCC)    Leg pain    Leg weakness    Seasonal allergies     Allergies Allergies  Allergen Reactions   Other Other (See Comments)    All nuts Throat pain   Peanut-Containing Drug Products Other (See Comments)    Throat pain    IV Location/Drains/Wounds Patient Lines/Drains/Airways Status     Active Line/Drains/Airways     Name Placement date Placement time Site Days   Peripheral IV 05/22/23 22 G 1" Anterior;Distal;Right;Upper Arm 05/22/23  0811  Arm  less than 1   Wound / Incision (Open or Dehisced) 01/27/23 Burn;Other (Comment) Ischial tuberosity Right 01/27/23  1500  Ischial tuberosity  115            Labs/Imaging Results for orders placed or performed during the hospital encounter of 05/22/23 (from the past 48 hour(s))  CBG monitoring, ED     Status: Abnormal   Collection Time: 05/22/23  7:31 AM  Result Value Ref Range   Glucose-Capillary 344 (H) 70 - 99 mg/dL    Comment: Glucose reference range applies only to samples taken after fasting for at least 8 hours.  Blood gas, venous     Status: None   Collection Time: 05/22/23  9:24 AM  Result Value Ref  Range   pH, Ven 7.35 7.25 - 7.43   pCO2, Ven 44 44 - 60 mmHg   pO2, Ven 34 32 - 45 mmHg   Bicarbonate 24.3 20.0 - 28.0 mmol/L   Acid-base deficit 1.5 0.0 - 2.0 mmol/L   O2 Saturation 58 %   Patient temperature 37.0     Comment: Performed at [facility], [address], [place], [place] [address]  Troponin I (High Sensitivity)     Status: None   Collection Time: 05/22/23  9:24 AM  Result Value Ref Range   Troponin I (High Sensitivity) 4 <18 ng/L    Comment: (NOTE) Elevated high sensitivity troponin I (hsTnI) values and significant  changes across serial measurements may suggest ACS but many other  chronic and acute conditions are known to elevate hsTnI results.  Refer to the "Links" section for chest pain algorithms and additional  guidance. Performed at [facility], [address], [place], [place] [address]   D-dimer, quantitative     Status: None   Collection Time: 05/22/23  9:24 AM  Result Value Ref Range   D-Dimer, Quant 0.33 0.00 - 0.50 ug/mL-FEU    Comment: (NOTE) At the manufacturer cut-off value of 0.5 g/mL FEU, this assay has a negative predictive value of 95-100%.This assay is intended for use in conjunction with a clinical pretest probability (PTP) assessment model to exclude pulmonary embolism (PE) and deep venous thrombosis (DVT) in outpatients suspected of PE or DVT. Results should be correlated with clinical presentation. Performed at [facility], [address] [place], [place] [address]   Lipase, blood     Status: None   Collection Time: 05/22/23  9:24 AM  Result Value Ref Range   Lipase 30 11 - 51 U/L    Comment: Performed at [facility], [address] [place], [place] [address]  CBG monitoring, ED     Status: Abnormal   Collection Time: 05/22/23  9:29 AM  Result Value Ref Range   Glucose-Capillary 310 (H) 70 - 99 mg/dL    Comment: Glucose reference range applies only to samples taken after fasting for at least 8 hours.  I-Stat CG4 Lactic Acid     Status: None   Collection Time: 05/22/23  9:34 AM  Result Value Ref Range   Lactic Acid, Venous 1.4 0.5 - 1.9 mmol/L  Basic metabolic panel     Status: Abnormal   Collection Time: 05/22/23 12:00 PM  Result Value Ref Range   Sodium 140 135 - 145 mmol/L   Potassium 3.2 (L) 3.5 - 5.1 mmol/L    Comment: HEMOLYSIS AT THIS LEVEL MAY AFFECT RESULT   Chloride 112 (H) 98 - 111 mmol/L   CO2 20 (L) 22 - 32 mmol/L   Glucose, Bld 224 (H) 70 - 99 mg/dL    Comment: Glucose reference range applies only to samples taken after fasting for at least 8 hours.   BUN 13 6 - 20 mg/dL   Creatinine, Ser 2.53 (L) 0.61 - 1.24 mg/dL   Calcium 5.9 (LL) 8.9 - 10.3 mg/dL    Comment: CRITICAL RESULT CALLED TO, READ BACK BY AND VERIFIED  WITH LAMARR,J. RN AT 1256 05/22/23 MULLINS,T DELTA CHECK NOTED    GFR, Estimated >60 >60 mL/min    Comment: (NOTE) Calculated using the CKD-EPI Creatinine Equation (2021)    Anion gap 8 5 - 15    Comment: Performed at [facility], [address] [place], [place] [address]  Beta-hydroxybutyric acid     Status: Abnormal   Collection Time: 05/22/23 12:00 PM  Result Value Ref Range   Beta-Hydroxybutyric Acid 1.66 (H) 0.05 - 0.27 mmol/L    Comment: Performed at [facility], [address], [place], [place] [address]  Urinalysis, Routine w reflex microscopic -Urine, Clean Catch     Status: Abnormal   Collection Time: 05/22/23 12:00 PM  Result Value Ref Range   Color, Urine STRAW (A) YELLOW   APPearance CLEAR CLEAR   Specific Gravity, Urine >1.046 (H) 1.005 - 1.030   pH 7.0 5.0 - 8.0   Glucose, UA >=500 (A) NEGATIVE mg/dL   Hgb urine dipstick NEGATIVE NEGATIVE   Bilirubin Urine NEGATIVE NEGATIVE   Ketones, ur 80 (A) NEGATIVE mg/dL   Protein, ur NEGATIVE NEGATIVE mg/dL   Nitrite NEGATIVE NEGATIVE   Leukocytes,Ua NEGATIVE NEGATIVE   RBC / HPF 0-5 0 - 5 RBC/hpf   WBC, UA 0-5 0 - 5 WBC/hpf   Bacteria, UA NONE SEEN NONE SEEN   Squamous Epithelial / HPF 0-5 0 - 5 /HPF    Comment: Performed at [facility], [address], [place], [place] [address]  Rapid urine drug screen (hospital performed)     Status: Abnormal   Collection Time: 05/22/23 12:00 PM  Result Value Ref Range   Opiates NONE DETECTED NONE DETECTED   Cocaine NONE DETECTED NONE DETECTED   Benzodiazepines NONE DETECTED NONE DETECTED   Amphetamines NONE DETECTED NONE DETECTED   Tetrahydrocannabinol POSITIVE (A) NONE DETECTED   Barbiturates NONE DETECTED NONE DETECTED    Comment: (NOTE) DRUG SCREEN FOR MEDICAL PURPOSES ONLY.  IF CONFIRMATION IS NEEDED FOR ANY PURPOSE, NOTIFY LAB WITHIN 5 DAYS.  LOWEST DETECTABLE LIMITS FOR URINE DRUG SCREEN Drug Class                      Cutoff (ng/mL) Amphetamine and metabolites    1000 Barbiturate and metabolites    200 Benzodiazepine                 200 Opiates and metabolites        300 Cocaine and metabolites        300 THC                            50 Performed at [facility], [address] [place], [place] [address]   Troponin I (High Sensitivity)     Status: None   Collection Time: 05/22/23  1:10 PM  Result Value Ref Range   Troponin I (High Sensitivity) 3 <18 ng/L    Comment: (NOTE) Elevated high sensitivity troponin I (hsTnI) values and significant  changes across serial measurements may suggest ACS but many other  chronic and acute conditions are known to elevate hsTnI results.  Refer to the "Links" section for chest pain algorithms and additional  guidance. Performed at [facility], [address] [place], [place] [address]   CBC with Differential     Status: Abnormal   Collection Time: 05/22/23  1:10 PM  Result Value Ref Range   WBC 6.2 4.0 - 10.5 K/uL   RBC 3.85 (L) 4.22 - 5.81 MIL/uL   Hemoglobin 10.5 (L) 13.0 - 17.0 g/dL   HCT 13.0 (L) 86.5 - 78.4 %   MCV 86.2 80.0 - 100.0 fL   MCH 27.3 26.0 - 34.0  pg   MCHC 31.6 30.0 - 36.0 g/dL   RDW 16.1 09.6 - 04.5 %   Platelets 220 150 - 400 K/uL   nRBC 0.0 0.0 - 0.2 %   Neutrophils Relative % 64 %   Neutro Abs 3.9 1.7 - 7.7 K/uL   Lymphocytes Relative 27 %   Lymphs Abs 1.7 0.7 - 4.0 K/uL   Monocytes Relative 9 %   Monocytes Absolute 0.6 0.1 - 1.0 K/uL   Eosinophils Relative 0 %   Eosinophils Absolute 0.0 0.0 - 0.5 K/uL   Basophils Relative 0 %   Basophils Absolute 0.0 0.0 - 0.1 K/uL   Immature Granulocytes 0 %   Abs Immature Granulocytes 0.01 0.00 - 0.07 K/uL    Comment: Performed at [facility], [address] [place], [place] [address]  Basic metabolic panel     Status: Abnormal   Collection Time: 05/22/23  1:10 PM  Result Value Ref Range   Sodium 137 135 - 145 mmol/L    Potassium 3.4 (L) 3.5 - 5.1 mmol/L   Chloride 105 98 - 111 mmol/L   CO2 23 22 - 32 mmol/L   Glucose, Bld 257 (H) 70 - 99 mg/dL    Comment: Glucose reference range applies only to samples taken after fasting for at least 8 hours.   BUN 14 6 - 20 mg/dL   Creatinine, Ser 1.91 0.61 - 1.24 mg/dL   Calcium 7.0 (L) 8.9 - 10.3 mg/dL   GFR, Estimated >47 >82 mL/min    Comment: (NOTE) Calculated using the CKD-EPI Creatinine Equation (2021)    Anion gap 9 5 - 15    Comment: Performed at [facility], [address], [place], [place] [address]  CBG monitoring, ED     Status: Abnormal   Collection Time: 05/22/23  5:28 PM  Result Value Ref Range   Glucose-Capillary 258 (H) 70 - 99 mg/dL    Comment: Glucose reference range applies only to samples taken after fasting for at least 8 hours.  CBG monitoring, ED     Status: Abnormal   Collection Time: 05/22/23 10:02 PM  Result Value Ref Range   Glucose-Capillary 224 (H) 70 - 99 mg/dL    Comment: Glucose reference range applies only to samples taken after fasting for at least 8 hours.   CT ABDOMEN PELVIS W CONTRAST  Result Date: 05/22/2023 CLINICAL DATA:  Three day history of nausea and vomiting in the setting of hyperglycemia EXAM: CT ABDOMEN AND PELVIS WITH CONTRAST TECHNIQUE: Multidetector CT imaging of the abdomen and pelvis was performed using the standard protocol following bolus administration of intravenous contrast. RADIATION DOSE REDUCTION: This exam was performed according to the departmental dose-optimization program which includes automated exposure control, adjustment of the mA and/or kV according to patient size and/or use of iterative reconstruction technique. CONTRAST:  OMNIPAQUE IOHEXOL 300 MG/ML  SOLN COMPARISON:  CT abdomen and pelvis dated 10/25/2022 FINDINGS: Lower chest: No focal consolidation or pulmonary nodule in the lung bases. No pleural effusion or pneumothorax demonstrated. Partially imaged heart size is  normal. Hepatobiliary: No focal hepatic lesions. No intra or extrahepatic biliary ductal dilation. Normal gallbladder. Pancreas: No focal lesions or main ductal dilation. Spleen: Normal in size without focal abnormality. Adrenals/Urinary Tract: No adrenal nodules. No suspicious renal mass, calculi or hydronephrosis. No focal bladder wall thickening. Stomach/Bowel: Normal appearance of the stomach. No evidence of bowel wall thickening, distention, or inflammatory changes. Appendix is not discretely seen. Vascular/Lymphatic: No  significant vascular findings are present. No enlarged abdominal or pelvic lymph nodes. Reproductive: Prostate is unremarkable. Other: No free fluid, fluid collection, or free air. Musculoskeletal: No acute or abnormal lytic or blastic osseous lesions. IMPRESSION: No acute abdominopelvic findings. Electronically Signed   By: Agustin Cree M.D.   On: 05/22/2023 09:15   DG Chest 1 View  Result Date: 05/21/2023 CLINICAL DATA:  Chest pain, shortness of breath.  Flutter. EXAM: CHEST  1 VIEW COMPARISON:  March 26, 2023. FINDINGS: The heart size and mediastinal contours are within normal limits. Both lungs are clear. The visualized skeletal structures are unremarkable. IMPRESSION: No active disease. Electronically Signed   By: Lupita Raider M.D.   On: 05/21/2023 15:40    Pending Labs Unresulted Labs (From admission, onward)     Start     Ordered   05/22/23 1545  Occult blood card to lab, stool  Once,   R        05/22/23 1544            Vitals/Pain Today's Vitals   05/22/23 1530 05/22/23 1541 05/22/23 1930 05/22/23 2300  BP: (!) 132/90  (!) 139/112 (!) 139/91  Pulse: 74  74 81  Resp: 15  13 15   Temp:  98.8 F (37.1 C) 98.8 F (37.1 C) 98.6 F (37 C)  TempSrc:  Oral    SpO2: 100%  100% 100%  Weight:      Height:      PainSc:        Isolation Precautions No active isolations  Medications Medications  rifaximin (XIFAXAN) tablet 550 mg (has no administration in time  range)  escitalopram (LEXAPRO) tablet 20 mg (20 mg Oral Given 05/22/23 1916)  insulin glargine-yfgn (SEMGLEE) injection 20 Units (20 Units Subcutaneous Given 05/22/23 1917)  pantoprazole (PROTONIX) injection 40 mg (40 mg Intravenous Given 05/22/23 1554)  insulin aspart (novoLOG) injection 0-15 Units (8 Units Subcutaneous Given 05/22/23 1736)  insulin aspart (novoLOG) injection 0-5 Units (2 Units Subcutaneous Given 05/22/23 2212)  enoxaparin (LOVENOX) injection 40 mg (40 mg Subcutaneous Given 05/22/23 2213)  0.9 % NaCl with KCl 20 mEq/ L  infusion (has no administration in time range)  acetaminophen (TYLENOL) tablet 650 mg (has no administration in time range)    Or  acetaminophen (TYLENOL) suppository 650 mg (has no administration in time range)  traZODone (DESYREL) tablet 25 mg (has no administration in time range)  ondansetron (ZOFRAN) tablet 4 mg (has no administration in time range)    Or  ondansetron (ZOFRAN) injection 4 mg (has no administration in time range)  albuterol (PROVENTIL) (2.5 MG/3ML) 0.083% nebulizer solution 2.5 mg (has no administration in time range)  HYDROmorphone (DILAUDID) injection 1 mg (has no administration in time range)  lactated ringers bolus 1,000 mL (0 mLs Intravenous Stopped 05/22/23 1023)  ondansetron (ZOFRAN) injection 4 mg (4 mg Intravenous Given 05/22/23 0920)  fentaNYL (SUBLIMAZE) injection 50 mcg (50 mcg Intravenous Given 05/22/23 0920)  iohexol (OMNIPAQUE) 300 MG/ML solution 100 mL (100 mLs Intravenous Contrast Given 05/22/23 0856)  lactated ringers bolus 1,000 mL (0 mLs Intravenous Stopped 05/22/23 1304)  droperidol (INAPSINE) 2.5 MG/ML injection 2.5 mg (2.5 mg Intravenous Given 05/22/23 1012)  calcium gluconate 1 g/ 50 mL sodium chloride IVPB (0 mg Intravenous Stopped 05/22/23 1613)    Mobility walks

## 2023-05-22 NOTE — ED Provider Notes (Signed)
   Repeat CMP with corrected anion gap (now 15, normal bicarb), not consistent with DKA.  Likely due to dehydration.  Able to tolerate 2 cups of apple juice but had return of nausea (without vomiting).  Will give additional nausea meds and plan to d/c home with same.  Encouraged to rest, hydrate.  Can follow-up with PCP.  Return here for new concerns.   Garlon Hatchet, PA-C 05/22/23 0409    Dione Booze, MD 05/22/23 [phone]

## 2023-05-22 NOTE — ED Triage Notes (Signed)
Patient reports n/v x 3 days with high blood sugars. Patient is type 1 diabetic. Patient was seen at [facility] last night for same. Patient reports feeling worse today. BGL at triage was 344.

## 2023-05-22 NOTE — ED Provider Notes (Addendum)
[place] EMERGENCY DEPARTMENT AT [facility] Provider Note   CSN: [id number] Arrival date & time: 05/22/23  2130     History  Chief Complaint  Patient presents with   Hyperglycemia    Edward Walls is a 42 y.o. male.   Hyperglycemia Associated symptoms: abdominal pain, nausea and vomiting      42 year old male with medical history significant for SIBO (small intestinal bacterial overgrowth, follows with GI at [facility]), DKA and type 1 diabetes who presents to the emergency department with severe abdominal pain, inability to tolerate oral intake, nausea and vomiting for the last several days.  The patient states that he has not had anything to eat for the last 3 days.  He endorses 9 out of 10 abdominal pain in a generalized location.  He endorses persistent NBNB emesis.  He was seen in the emergency department yesterday and was found to be not in DKA and subsequently discharged after tolerating oral intake.  He states that he has had persistent pain and has not been able to manage his symptoms at home.  He states that he is not sure if he is passing gas.  No fevers or chills.  He does endorse marijuana use, last used 3 days ago. He additionally endorses a sharp pleuritic chest discomfort.   Home Medications Prior to Admission medications   Medication Sig Start Date End Date Taking? Authorizing Provider  Continuous Glucose Sensor (DEXCOM G6 SENSOR) MISC Replace every 10 days 03/02/23   Carlus Pavlov, MD  Continuous Glucose Transmitter (DEXCOM G6 TRANSMITTER) MISC 1 Device by Does not apply route every 3 (three) months. 03/02/23   Carlus Pavlov, MD  gabapentin (NEURONTIN) 300 MG capsule Take 2 capsules (600 mg total) by mouth 3 (three) times daily. 06/27/22   Levert Feinstein, MD  insulin aspart (NOVOLOG) 100 UNIT/ML FlexPen Inject 10 Units into the skin 3 (three) times daily with meals. 01/29/23 04/29/23  [name], [name], DO  Insulin Disposable Pump (OMNIPOD 5 G6 INTRO,  GEN 5,) KIT 1 each by Does not apply route as needed. Patient not taking: Reported on 04/02/2023 02/20/22   Carlus Pavlov, MD  insulin glargine (LANTUS) 100 UNIT/ML Solostar Pen Inject 30 Units into the skin daily. 01/29/23 04/29/23  [name], [name], DO  Insulin Pen Needle 32G X 4 MM MISC Use 4-5x a day 05/26/22   Marguerita Merles Latif, DO  lidocaine (LIDODERM) 5 % Place 1 patch onto the skin daily. Remove & Discard patch within 12 hours or as directed by MD 01/30/23   [name], [name], DO  metoCLOPramide (REGLAN) 10 MG tablet Take 1 tablet (10 mg total) by mouth 3 (three) times daily with meals for 7 days, THEN 1 tablet (10 mg total) every 8 (eight) hours as needed for up to 7 days for nausea. 01/29/23 04/02/23  [name], [name], DO  ondansetron (ZOFRAN) 4 MG tablet Take 1 tablet (4 mg total) by mouth every 6 (six) hours as needed for nausea or vomiting. 12/29/22   [name], MD  ondansetron (ZOFRAN-ODT) 4 MG disintegrating tablet Take 1 tablet (4 mg total) by mouth every 8 (eight) hours as needed for nausea. 05/22/23   Garlon Hatchet, PA-C      Allergies    Other and Peanut-containing drug products    Review of Systems   Review of Systems  Gastrointestinal:  Positive for abdominal pain, nausea and vomiting.  All other systems reviewed and are negative.   Physical Exam Updated  Vital Signs BP 137/81   Pulse 74   Temp 99.3 F (37.4 C) (Oral)   Resp 13   Ht 6\' 3"  (1.905 m)   Wt 65.2 kg   SpO2 100%   BMI 17.97 kg/m  Physical Exam Vitals and nursing note reviewed.  Constitutional:      General: He is not in acute distress.    Appearance: He is well-developed.  HENT:     Head: Normocephalic and atraumatic.     Mouth/Throat:     Mouth: Mucous membranes are dry.  Eyes:     Conjunctiva/sclera: Conjunctivae normal.  Cardiovascular:     Rate and Rhythm: Normal rate and regular rhythm.  Pulmonary:     Effort: Pulmonary effort is normal. No respiratory distress.     Breath sounds:  Normal breath sounds.  Abdominal:     Palpations: Abdomen is soft.     Tenderness: There is abdominal tenderness. There is guarding.  Musculoskeletal:        General: No swelling.     Cervical back: Neck supple.  Skin:    General: Skin is warm and dry.     Capillary Refill: Capillary refill takes less than 2 seconds.  Neurological:     Mental Status: He is alert.  Psychiatric:        Mood and Affect: Mood normal.     ED Results / Procedures / Treatments   Labs (all labs ordered are listed, but only abnormal results are displayed) Labs Reviewed  BASIC METABOLIC PANEL - Abnormal; Notable for the following components:      Result Value   Potassium 3.2 (*)    Chloride 112 (*)    CO2 20 (*)    Glucose, Bld 224 (*)    Creatinine, Ser 0.48 (*)    Calcium 5.9 (*)    All other components within normal limits  BETA-HYDROXYBUTYRIC ACID - Abnormal; Notable for the following components:   Beta-Hydroxybutyric Acid 1.66 (*)    All other components within normal limits  URINALYSIS, ROUTINE W REFLEX MICROSCOPIC - Abnormal; Notable for the following components:   Color, Urine STRAW (*)    Specific Gravity, Urine >1.046 (*)    Glucose, UA >=500 (*)    Ketones, ur 80 (*)    All other components within normal limits  RAPID URINE DRUG SCREEN, HOSP PERFORMED - Abnormal; Notable for the following components:   Tetrahydrocannabinol POSITIVE (*)    All other components within normal limits  CBC WITH DIFFERENTIAL/PLATELET - Abnormal; Notable for the following components:   RBC 3.85 (*)    Hemoglobin 10.5 (*)    HCT 33.2 (*)    All other components within normal limits  BASIC METABOLIC PANEL - Abnormal; Notable for the following components:   Potassium 3.4 (*)    Glucose, Bld 257 (*)    Calcium 7.0 (*)    All other components within normal limits  CBG MONITORING, ED - Abnormal; Notable for the following components:   Glucose-Capillary 344 (*)    All other components within normal limits   CBG MONITORING, ED - Abnormal; Notable for the following components:   Glucose-Capillary 310 (*)    All other components within normal limits  BLOOD GAS, VENOUS  D-DIMER, QUANTITATIVE  LIPASE, BLOOD  I-STAT CG4 LACTIC ACID, ED  TROPONIN I (HIGH SENSITIVITY)  TROPONIN I (HIGH SENSITIVITY)    EKG EKG Interpretation Date/Time:  Tuesday May 22 2023 10:20:12 EDT Ventricular Rate:  75 PR Interval:  105 QRS  Duration:  82 QT Interval:  380 QTC Calculation: 425 R Axis:   85  Text Interpretation: Sinus rhythm Short PR interval Borderline T wave abnormalities Minimal ST elevation, anterior leads No significant change since last tracing Confirmed by Ernie Avena ([id number]) on 05/22/2023 10:48:59 AM  Radiology CT ABDOMEN PELVIS W CONTRAST  Result Date: 05/22/2023 CLINICAL DATA:  Three day history of nausea and vomiting in the setting of hyperglycemia EXAM: CT ABDOMEN AND PELVIS WITH CONTRAST TECHNIQUE: Multidetector CT imaging of the abdomen and pelvis was performed using the standard protocol following bolus administration of intravenous contrast. RADIATION DOSE REDUCTION: This exam was performed according to the departmental dose-optimization program which includes automated exposure control, adjustment of the mA and/or kV according to patient size and/or use of iterative reconstruction technique. CONTRAST:  OMNIPAQUE IOHEXOL 300 MG/ML  SOLN COMPARISON:  CT abdomen and pelvis dated 10/25/2022 FINDINGS: Lower chest: No focal consolidation or pulmonary nodule in the lung bases. No pleural effusion or pneumothorax demonstrated. Partially imaged heart size is normal. Hepatobiliary: No focal hepatic lesions. No intra or extrahepatic biliary ductal dilation. Normal gallbladder. Pancreas: No focal lesions or main ductal dilation. Spleen: Normal in size without focal abnormality. Adrenals/Urinary Tract: No adrenal nodules. No suspicious renal mass, calculi or hydronephrosis. No focal bladder wall  thickening. Stomach/Bowel: Normal appearance of the stomach. No evidence of bowel wall thickening, distention, or inflammatory changes. Appendix is not discretely seen. Vascular/Lymphatic: No significant vascular findings are present. No enlarged abdominal or pelvic lymph nodes. Reproductive: Prostate is unremarkable. Other: No free fluid, fluid collection, or free air. Musculoskeletal: No acute or abnormal lytic or blastic osseous lesions. IMPRESSION: No acute abdominopelvic findings. Electronically Signed   By: Agustin Cree M.D.   On: 05/22/2023 09:15   DG Chest 1 View  Result Date: 05/21/2023 CLINICAL DATA:  Chest pain, shortness of breath.  Flutter. EXAM: CHEST  1 VIEW COMPARISON:  March 26, 2023. FINDINGS: The heart size and mediastinal contours are within normal limits. Both lungs are clear. The visualized skeletal structures are unremarkable. IMPRESSION: No active disease. Electronically Signed   By: Lupita Raider M.D.   On: 05/21/2023 15:40    Procedures .Critical Care  Performed by: Ernie Avena, MD Authorized by: Ernie Avena, MD   Critical care provider statement:    Critical care time (minutes):  30   Critical care was necessary to treat or prevent imminent or life-threatening deterioration of the following conditions:  Dehydration   Critical care was time spent personally by me on the following activities:  Development of treatment plan with patient or surrogate, discussions with consultants, evaluation of patient's response to treatment, examination of patient, ordering and review of laboratory studies, ordering and review of radiographic studies, ordering and performing treatments and interventions, pulse oximetry, re-evaluation of patient's condition and review of old charts   Care discussed with: admitting provider       Medications Ordered in ED Medications  calcium gluconate 1 g/ 50 mL sodium chloride IVPB (has no administration in time range)  lactated ringers bolus 1,000  mL (0 mLs Intravenous Stopped 05/22/23 1023)  ondansetron (ZOFRAN) injection 4 mg (4 mg Intravenous Given 05/22/23 0920)  fentaNYL (SUBLIMAZE) injection 50 mcg (50 mcg Intravenous Given 05/22/23 0920)  iohexol (OMNIPAQUE) 300 MG/ML solution 100 mL (100 mLs Intravenous Contrast Given 05/22/23 0856)  lactated ringers bolus 1,000 mL (0 mLs Intravenous Stopped 05/22/23 1304)  droperidol (INAPSINE) 2.5 MG/ML injection 2.5 mg (2.5 mg Intravenous Given 05/22/23 1012)  ED Course/ Medical Decision Making/ A&P                                 Medical Decision Making Amount and/or Complexity of Data Reviewed Labs: ordered. Radiology: ordered.  Risk Prescription drug management. Decision regarding hospitalization.    42 year old male with medical history significant for SIBO (small intestinal bacterial overgrowth, follows with GI at [facility]), DKA and type 1 diabetes who presents to the emergency department with severe abdominal pain, inability to tolerate oral intake, nausea and vomiting for the last several days.  The patient states that he has not had anything to eat for the last 3 days.  He endorses 9 out of 10 abdominal pain in a generalized location.  He endorses persistent NBNB emesis.  He was seen in the emergency department yesterday and was found to be not in DKA and subsequently discharged after tolerating oral intake.  He states that he has had persistent pain and has not been able to manage his symptoms at home.  He states that he is not sure if he is passing gas.  No fevers or chills.  He does endorse marijuana use, last used 3 days ago. He additionally endorses a sharp pleuritic chest discomfort.   On arrival, the patient was afebrile, borderline tachycardic with heart rates in the low 90s, saturating well on room air, hemodynamically stable.  Differential diagnosis includes dehydration, AKI, DKA, HHS.  Initial CBG was 344.  Patient endorsing diffuse abdominal pain with associated nausea and  vomiting.  He endorses cannabis use.  Considered cannabis hyperemesis syndrome, gastroparesis.  Patient has had nothing to eat or drink in the last 3 days.  Initial laboratory evaluation revealed a VBG without acidosis, pH 7.35, HCO3 24, initial beta hydroxybutyrate was elevated to 1.66, BMP appears to be diluted  Revealed hypocalcemia to 5.9, no evidence of AKI with a creatinine of 0.48, improved blood glucose to 224 after fluid resuscitation, mild acidosis with a bicarbonate of 20, anion gap normal at 8, mild hypokalemia to 3.2, urinalysis without evidence of UTI, ketones present, THC positive on UDS.  Initial troponin was 3.  Dimer was normal.  Low concern for ACS, PE.  CT abdomen pelvis: No acute findings.  The patient was administered 2 L of LR for volume resuscitation.  He was administered Zofran, droperidol, fentanyl.  No evidence of prolonged QTc on EKG with a QTc of 425.  A chest x-ray was performed which was unremarkable.  The patient was persistently hypocalcemic with a calcium of 7.0 on repeat BMP.  He was administered 1 g of calcium gluconate.  He is not in DKA.  I have concern for dehydration and in the setting of the patient's dehydration, inability to tolerate oral intake with suspected cannabis hyperemesis syndrome versus gastroparesis, severe dehydration with need for significant volume resuscitation over the past 2 ER visits, medicine was consulted for admission, Dr. Erenest Blank accepting.   Final Clinical Impression(s) / ED Diagnoses Final diagnoses:  Hyperglycemia  Dehydration  Hypocalcemia  Cannabis hyperemesis syndrome concurrent with and due to cannabis abuse [facility])    Rx / DC Orders ED Discharge Orders     None         Ernie Avena, MD 05/22/23 1439    Ernie Avena, MD 05/22/23 1451

## 2023-05-23 DIAGNOSIS — E1065 Type 1 diabetes mellitus with hyperglycemia: Secondary | ICD-10-CM

## 2023-05-23 DIAGNOSIS — R112 Nausea with vomiting, unspecified: Secondary | ICD-10-CM | POA: Diagnosis not present

## 2023-05-23 LAB — GLUCOSE, CAPILLARY
Glucose-Capillary: 227 mg/dL — ABNORMAL HIGH (ref 70–99)
Glucose-Capillary: 134 mg/dL — ABNORMAL HIGH (ref 70–99)
Glucose-Capillary: 94 mg/dL (ref 70–99)
Glucose-Capillary: 115 mg/dL — ABNORMAL HIGH (ref 70–99)

## 2023-05-23 LAB — CBC WITH DIFFERENTIAL/PLATELET
Abs Immature Granulocytes: 0.01 10*3/uL (ref 0.00–0.07)
Basophils Absolute: 0 10*3/uL (ref 0.0–0.1)
Basophils Relative: 0 %
Eosinophils Absolute: 0 10*3/uL (ref 0.0–0.5)
Eosinophils Relative: 0 %
HCT: 38.3 % — ABNORMAL LOW (ref 39.0–52.0)
Hemoglobin: 12.4 g/dL — ABNORMAL LOW (ref 13.0–17.0)
Immature Granulocytes: 0 %
Lymphocytes Relative: 25 %
Lymphs Abs: 1.5 10*3/uL (ref 0.7–4.0)
MCH: 27.9 pg (ref 26.0–34.0)
MCHC: 32.4 g/dL (ref 30.0–36.0)
MCV: 86.3 fL (ref 80.0–100.0)
Monocytes Absolute: 0.5 10*3/uL (ref 0.1–1.0)
Monocytes Relative: 8 %
Neutro Abs: 3.9 10*3/uL (ref 1.7–7.7)
Neutrophils Relative %: 67 %
Platelets: 264 10*3/uL (ref 150–400)
RBC: 4.44 MIL/uL (ref 4.22–5.81)
RDW: 12.3 % (ref 11.5–15.5)
WBC: 5.9 10*3/uL (ref 4.0–10.5)
nRBC: 0 % (ref 0.0–0.2)

## 2023-05-23 LAB — COMPREHENSIVE METABOLIC PANEL
ALT: 14 U/L (ref 0–44)
AST: 13 U/L — ABNORMAL LOW (ref 15–41)
Albumin: 3.4 g/dL — ABNORMAL LOW (ref 3.5–5.0)
Alkaline Phosphatase: 35 U/L — ABNORMAL LOW (ref 38–126)
Anion gap: 11 (ref 5–15)
BUN: 9 mg/dL (ref 6–20)
CO2: 24 mmol/L (ref 22–32)
Calcium: 8.1 mg/dL — ABNORMAL LOW (ref 8.9–10.3)
Chloride: 96 mmol/L — ABNORMAL LOW (ref 98–111)
Creatinine, Ser: 0.6 mg/dL — ABNORMAL LOW (ref 0.61–1.24)
GFR, Estimated: >60 mL/min (ref >60)
Glucose, Bld: 215 mg/dL — ABNORMAL HIGH (ref 70–99)
Potassium: 3.4 mmol/L — ABNORMAL LOW (ref 3.5–5.1)
Sodium: 131 mmol/L — ABNORMAL LOW (ref 135–145)
Total Bilirubin: 2.6 mg/dL — ABNORMAL HIGH (ref 0.3–1.2)
Total Protein: 5.6 g/dL — ABNORMAL LOW (ref 6.5–8.1)

## 2023-05-23 LAB — MAGNESIUM: Magnesium: 1.8 mg/dL (ref 1.7–2.4)

## 2023-05-23 LAB — BETA-HYDROXYBUTYRIC ACID: Beta-Hydroxybutyric Acid: 3.11 mmol/L — ABNORMAL HIGH (ref 0.05–0.27)

## 2023-05-23 LAB — PHOSPHORUS: Phosphorus: 2.2 mg/dL — ABNORMAL LOW (ref 2.5–4.6)

## 2023-05-23 MED ORDER — PROCHLORPERAZINE EDISYLATE 10 MG/2ML IJ SOLN: 10.0000 mg | Freq: Four times a day (QID) | INTRAMUSCULAR | Status: DC | PRN

## 2023-05-23 MED ORDER — POTASSIUM PHOSPHATES 15 MMOLE/5ML IV SOLN
20.0000 mmol | Freq: Once | INTRAVENOUS | Status: AC
  Administered 2023-05-23: 20 mmol via INTRAVENOUS
  Filled 2023-05-23: qty 6.67

## 2023-05-23 MED ORDER — SODIUM CHLORIDE 0.9 % IV SOLN
INTRAVENOUS | Status: DC | PRN
  Administered 2023-05-23: 10 mL/h via INTRAVENOUS

## 2023-05-23 MED ORDER — MAGNESIUM SULFATE 2 GM/50ML IV SOLN
2.0000 g | Freq: Once | INTRAVENOUS | Status: AC
  Administered 2023-05-23: 2 g via INTRAVENOUS
  Filled 2023-05-23: qty 50

## 2023-05-23 MED ORDER — PROCHLORPERAZINE EDISYLATE 10 MG/2ML IJ SOLN
10.0000 mg | Freq: Four times a day (QID) | INTRAMUSCULAR | Status: DC | PRN
  Administered 2023-05-23 – 2023-05-25 (×2): 10 mg via INTRAVENOUS
  Filled 2023-05-23 (×2): qty 2

## 2023-05-23 MED ORDER — SODIUM CHLORIDE 0.9 % IV BOLUS
1000.0000 mL | Freq: Once | INTRAVENOUS | Status: AC
  Administered 2023-05-23: 1000 mL via INTRAVENOUS

## 2023-05-23 NOTE — TOC CM/SW Note (Signed)
Transition of Care [facility]) - Inpatient Brief Assessment   Patient Details  Name: Edward Walls MRN: [id number] Date of Birth: 02-Jul-1981  Transition of Care [facility]) CM/SW Contact:    Otelia Santee, LCSW Phone Number: 05/23/2023, 1:17 PM   Clinical Narrative: Addressed SDOH concerns. Resources for transportation and food placed on AVS.    Transition of Care Asessment: Insurance and Status: Insurance coverage has been reviewed Patient has primary care physician: Yes Home environment has been reviewed: Home Prior level of function:: Independent Prior/Current Home Services: No current home services Social Determinants of Health Reivew: SDOH reviewed no interventions necessary Readmission risk has been reviewed: Yes Transition of care needs: no transition of care needs at this time

## 2023-05-23 NOTE — Progress Notes (Signed)
PROGRESS NOTE    Edward Walls  HQI:[id number] DOB: 03-24-1981 DOA: 05/22/2023 PCP: Mliss Sax, MD   Brief Narrative:  HPI: Edward Walls is a 42 y.o. male with medical history significant for type 1 diabetes, small intestinal bacterial overgrowth, marijuana use intermittent chronic nausea and vomiting being admitted to the hospital with recurrent abdominal discomfort, intractable nausea and vomiting for the last 3 to 4 days.  He has had multiple hospital admissions for intractable nausea vomiting, as well as DKA.  He presented to the emergency department on 8/5 with intractable nausea vomiting abdominal pain.  Was found to not be in DKA, was given IV fluids, supportive care felt better and discharged home.  However he came back this morning to [facility], ER with continued complaints of chest pain, intractable vomiting.  He denies any fevers or chills, cough, chest pain, shortness of breath.  No hematemesis or coffee-ground's.  Tells me that his pain is similar to prior bouts of intractable vomiting.  Last bowel movement was about 5 days ago.   ED Course: In the emergency department, vital signs are unremarkable.  CBC unremarkable except for hemoglobin of 10.5, which is a new finding for him.  Blood sugar this morning 310, otherwise BMP relatively unremarkable.  He was given a dose of IV calcium gluconate due to mild hypocalcemia.    **Interim History Patient continues to be nauseous and vomiting and so added Compazine.  Diabetes education coordinator consulted and adjusting medications as necessary.    Assessment and Plan:  Intractable nausea and Vomiting -No evidence of acute process, especially considering CT scan with no acute findings.   -Likely cannabinoid hyperemesis given Positive TCH, versus gastroparesis. -C/w Observation admission -Supportive care with IV fluids, pain and nausea medications as needed -I have added Compazine in addition to his Zofran given his  nausea and vomiting this morning -Clear liquid diet and advance as tolerated and advanced to Full if Tolerated -Give an additional 1 Liter bolus as below  -Advance diet as tolerated if tolerating p.o.'s without issues likely can be discharged in next 24 to 48 hours   Uncontrolled Type 1 Diabetes Mellitus - Hyperglycemia without Metabolic Acidosis but has Ketosis  -Carb controlled diet as it is advanced -Beta-Hydroxybutyric Acid Trend: 4.35 -> 1.66 -> 3.11 -Resume his home basal insulin at reduced dose and is now on 20 units but will go to 25 units of Semglee and also on Moderate dose sliding scale insulin AC/HS -Will aslo add 4 units of Novolog TID with Meals if eating the Carb Modified Diet -CBG Trend: Recent Labs  Lab 05/22/23 0731 05/22/23 0929 05/22/23 1728 05/22/23 2202 05/23/23 0738 05/23/23 1141 05/23/23 1653  GLUCAP 344* 310* 258* 224* 227* 134* 94  Glucose Trend: Recent Labs  Lab 05/21/23 1347 05/22/23 0008 05/22/23 1200 05/22/23 1310 05/23/23 0847  GLUCOSE 263* 297* 224* 257* 215*   Hyponatremia -Mild. Na+ Trend: Recent Labs  Lab 05/21/23 1347 05/21/23 2145 05/22/23 0008 05/22/23 1200 05/22/23 1310 05/23/23 0847  NA 137 134* 135 140 137 131*  -C/w IVF with NS + 20 mEQ of KCL at 100 mL/hr -Repeat CMP in the AM    Hypocalcemia -status post IV replacement in the ER -Calcium Level Trend: Recent Labs  Lab 05/21/23 1347 05/22/23 0008 05/22/23 1200 05/22/23 1310 05/23/23 0847  CALCIUM 9.1 8.7* 5.9* 7.0* 8.1*  -Continue to Monitor and Trend and repeat CMP in the AM   Hypokalemia -Patient's K+ Level Trend: Recent Labs  Lab 05/21/23 1347 05/21/23 2145 05/22/23 0008 05/22/23 1200 05/22/23 1310 05/23/23 0847  K 3.9 3.8 3.9 3.2* 3.4* 3.4*  -Replete with IV K Phos 20 mmol + NS with 20 mEQ KCL at 100 mL/hr -Continue to Monitor and Replete as Necessary -Repeat CMP in the AM   Hypophosphatemia -Phos Level Trend: Recent Labs  Lab 05/23/23 0847   PHOS 2.2*  -Replete with IV K Phos 20 mmol -Continue to Monitor and Replete as Necessary -Repeat Phos Level in the AM   Normocytic Anemia  -Hgb/Hct Trend: Recent Labs  Lab 05/21/23 1347 05/21/23 2145 05/22/23 1310  HGB 13.0 13.6 10.5*  HCT 40.5 40.0 33.2*  MCV 85.3  --  86.2  -Checking Hemoccult -Check Anemia Panel in the AM -Denies any Melena or Hematemesis -Could be a spurious result so rechecking -Continue to to Monitor for S/Sx of Bleeding; No overt bleeding noted -Repeat CBC in the AM   Hyperbilirubinemia -Bilirubin Trend: Recent Labs  Lab 05/21/23 1347 05/22/23 0008 05/23/23 0847  BILITOT 2.1* 1.9* 2.6*  -C/w IVF Hydration and continue IVF Bolus  -Continue to Monitor and Trend and repeat CMP in the AM   Hypoalbuminemia -Patient's Albumin Trend: Recent Labs  Lab 05/21/23 1347 05/22/23 0008 05/23/23 0847  ALBUMIN 3.9 3.4* 3.4*  -Continue to Monitor and Trend and repeat CMP in the AM   DVT prophylaxis: enoxaparin (LOVENOX) injection 40 mg Start: 05/22/23 2200 SCDs Start: 05/22/23 1527    Code Status: Full Code Family Communication: No family present at bedside  Disposition Plan:  Level of care: Med-Surg Status is: Observation The patient will require care spanning > 2 midnights and should be moved to inpatient because: Will need to Ensure that the patient is able to tolerate po prior to Safe D/C   Consultants:  None  Procedures:  None  Antimicrobials:  Anti-infectives (From admission, onward)    Start     Dose/Rate Route Frequency Ordered Stop   05/22/23 1600  rifaximin (XIFAXAN) tablet 550 mg        550 mg Oral 3 times daily 05/22/23 1527         Subjective: Seen and examined at bedside he still feeling very nauseous.  Denies chest pain or shortness of breath.  Felt like he was wanting to vomit.  Had his emesis bag by him and is spitting into it.  No other concerns or complaints at this time.  Objective: Vitals:   05/22/23 2300 05/23/23  0127 05/23/23 0537 05/23/23 0939  BP: (!) 139/91 (!) 150/96 (!) 149/96 (!) 144/94  Pulse: 81 76 89 96  Resp: 15 16 16 17   Temp: 98.6 F (37 C) 98.5 F (36.9 C) 98.7 F (37.1 C) 99 F (37.2 C)  TempSrc:  Oral Oral   SpO2: 100% 99% 100% 100%  Weight:      Height:        Intake/Output Summary (Last 24 hours) at 05/23/2023 1748 Last data filed at 05/23/2023 0116 Gross per 24 hour  Intake 180.85 ml  Output --  Net 180.85 ml   Filed Weights   05/22/23 0729  Weight: 65.2 kg   Examination: Physical Exam:  Constitutional: Thin AAM appears a little uncomfortable Respiratory: Diminished to auscultation bilaterally, no wheezing, rales, rhonchi or crackles. Normal respiratory effort and patient is not tachypenic. No accessory muscle use.  Unlabored breathing Cardiovascular: RRR, no murmurs / rubs / gallops. S1 and S2 auscultated. No extremity edema Abdomen: Soft, a little tender to palpate, non-distended.. Bowel  sounds positive.  GU: Deferred. Musculoskeletal: No clubbing / cyanosis of digits/nails. No joint deformity upper and lower extremities.  Skin: No rashes, lesions, ulcers on limited skin evaluation. No induration; Warm and dry.  Neurologic: CN 2-12 grossly intact with no focal deficits. Romberg sign and cerebellar reflexes not assessed.  Psychiatric: Normal judgment and insight. Alert and oriented x 3. Normal mood and appropriate affect.   Data Reviewed: I have personally reviewed following labs and imaging studies  CBC: Recent Labs  Lab 05/21/23 1347 05/21/23 2145 05/22/23 1310 05/23/23 0847  WBC 8.0  --  6.2 5.9  NEUTROABS 6.0  --  3.9 3.9  HGB 13.0 13.6 10.5* 12.4*  HCT 40.5 40.0 33.2* 38.3*  MCV 85.3  --  86.2 86.3  PLT 313  --  220 264   Basic Metabolic Panel: Recent Labs  Lab 05/21/23 1347 05/21/23 1939 05/21/23 2145 05/22/23 0008 05/22/23 1200 05/22/23 1310 05/23/23 0847  NA 137  --  134* 135 140 137 131*  K 3.9  --  3.8 3.9 3.2* 3.4* 3.4*  CL 95*  --    --  97* 112* 105 96*  CO2 22  --   --  23 20* 23 24  GLUCOSE 263*  --   --  297* 224* 257* 215*  BUN 24*  --   --  20 13 14 9   CREATININE 0.93  --   --  1.25* 0.48* 0.61 0.60*  CALCIUM 9.1  --   --  8.7* 5.9* 7.0* 8.1*  MG  --  2.1  --   --   --   --  1.8  PHOS  --   --   --   --   --   --  2.2*   GFR: Estimated Creatinine Clearance: 113.2 mL/min (A) (by C-G formula based on SCr of 0.6 mg/dL (L)). Liver Function Tests: Recent Labs  Lab 05/21/23 1347 05/22/23 0008 05/23/23 0847  AST 20 14* 13*  ALT 17 18 14   ALKPHOS 42 40 35*  BILITOT 2.1* 1.9* 2.6*  PROT 6.5 5.9* 5.6*  ALBUMIN 3.9 3.4* 3.4*   Recent Labs  Lab 05/21/23 1347 05/22/23 0924  LIPASE 25 30   No results for input(s): "AMMONIA" in the last 168 hours. Coagulation Profile: No results for input(s): "INR", "PROTIME" in the last 168 hours. Cardiac Enzymes: No results for input(s): "CKTOTAL", "CKMB", "CKMBINDEX", "TROPONINI" in the last 168 hours. BNP (last 3 results) No results for input(s): "PROBNP" in the last 8760 hours. HbA1C: No results for input(s): "HGBA1C" in the last 72 hours. CBG: Recent Labs  Lab 05/22/23 1728 05/22/23 2202 05/23/23 0738 05/23/23 1141 05/23/23 1653  GLUCAP 258* 224* 227* 134* 94   Lipid Profile: No results for input(s): "CHOL", "HDL", "LDLCALC", "TRIG", "CHOLHDL", "LDLDIRECT" in the last 72 hours. Thyroid Function Tests: No results for input(s): "TSH", "T4TOTAL", "FREET4", "T3FREE", "THYROIDAB" in the last 72 hours. Anemia Panel: No results for input(s): "VITAMINB12", "FOLATE", "FERRITIN", "TIBC", "IRON", "RETICCTPCT" in the last 72 hours. Sepsis Labs: Recent Labs  Lab 05/22/23 0934  LATICACIDVEN 1.4   Recent Results (from the past 240 hour(s))  Resp panel by RT-PCR (RSV, Flu A&B, Covid) Anterior Nasal Swab     Status: None   Collection Time: 05/21/23  1:48 PM   Specimen: Anterior Nasal Swab  Result Value Ref Range Status   SARS Coronavirus 2 by RT PCR NEGATIVE  NEGATIVE Final   Influenza A by PCR NEGATIVE NEGATIVE Final   Influenza  B by PCR NEGATIVE NEGATIVE Final    Comment: (NOTE) The Xpert Xpress SARS-CoV-2/FLU/RSV plus assay is intended as an aid in the diagnosis of influenza from Nasopharyngeal swab specimens and should not be used as a sole basis for treatment. Nasal washings and aspirates are unacceptable for Xpert Xpress SARS-CoV-2/FLU/RSV testing.  Fact Sheet for Patients: BloggerCourse.com  Fact Sheet for Healthcare Providers: SeriousBroker.it  This test is not yet approved or cleared by the [place] FDA and has been authorized for detection and/or diagnosis of SARS-CoV-2 by FDA under an Emergency Use Authorization (EUA). This EUA will remain in effect (meaning this test can be used) for the duration of the COVID-19 declaration under Section 564(b)(1) of the Act, 21 U.S.C. section 360bbb-3(b)(1), unless the authorization is terminated or revoked.     Resp Syncytial Virus by PCR NEGATIVE NEGATIVE Final    Comment: (NOTE) Fact Sheet for Patients: BloggerCourse.com  Fact Sheet for Healthcare Providers: SeriousBroker.it  This test is not yet approved or cleared by the [place] FDA and has been authorized for detection and/or diagnosis of SARS-CoV-2 by FDA under an Emergency Use Authorization (EUA). This EUA will remain in effect (meaning this test can be used) for the duration of the COVID-19 declaration under Section 564(b)(1) of the Act, 21 U.S.C. section 360bbb-3(b)(1), unless the authorization is terminated or revoked.  Performed at [facility], [address], [place], [place] [address]     Radiology Studies: CT ABDOMEN PELVIS W CONTRAST  Result Date: 05/22/2023 CLINICAL DATA:  Three day history of nausea and vomiting in the setting of hyperglycemia EXAM: CT ABDOMEN AND PELVIS WITH CONTRAST TECHNIQUE:  Multidetector CT imaging of the abdomen and pelvis was performed using the standard protocol following bolus administration of intravenous contrast. RADIATION DOSE REDUCTION: This exam was performed according to the departmental dose-optimization program which includes automated exposure control, adjustment of the mA and/or kV according to patient size and/or use of iterative reconstruction technique. CONTRAST:  OMNIPAQUE IOHEXOL 300 MG/ML  SOLN COMPARISON:  CT abdomen and pelvis dated 10/25/2022 FINDINGS: Lower chest: No focal consolidation or pulmonary nodule in the lung bases. No pleural effusion or pneumothorax demonstrated. Partially imaged heart size is normal. Hepatobiliary: No focal hepatic lesions. No intra or extrahepatic biliary ductal dilation. Normal gallbladder. Pancreas: No focal lesions or main ductal dilation. Spleen: Normal in size without focal abnormality. Adrenals/Urinary Tract: No adrenal nodules. No suspicious renal mass, calculi or hydronephrosis. No focal bladder wall thickening. Stomach/Bowel: Normal appearance of the stomach. No evidence of bowel wall thickening, distention, or inflammatory changes. Appendix is not discretely seen. Vascular/Lymphatic: No significant vascular findings are present. No enlarged abdominal or pelvic lymph nodes. Reproductive: Prostate is unremarkable. Other: No free fluid, fluid collection, or free air. Musculoskeletal: No acute or abnormal lytic or blastic osseous lesions. IMPRESSION: No acute abdominopelvic findings. Electronically Signed   By: Agustin Cree M.D.   On: 05/22/2023 09:15    Scheduled Meds:  enoxaparin (LOVENOX) injection  40 mg Subcutaneous Q24H   escitalopram  20 mg Oral Daily   insulin aspart  0-15 Units Subcutaneous TID WC   insulin aspart  0-5 Units Subcutaneous QHS   insulin glargine-yfgn  20 Units Subcutaneous Daily   pantoprazole (PROTONIX) IV  40 mg Intravenous Daily   rifaximin  550 mg Oral TID   Continuous Infusions:   0.9 % NaCl with KCl 20 mEq / L 100 mL/hr at 05/23/23 1027   magnesium sulfate bolus IVPB  potassium PHOSPHATE IVPB (in mmol)     sodium chloride      LOS: 0 days   Marguerita Merles, DO [facility] Available via Epic secure chat 7am-7pm After these hours, please refer to coverage provider listed on [email or web address] 05/23/2023, 5:48 PM

## 2023-05-23 NOTE — Inpatient Diabetes Management (Signed)
Inpatient Diabetes Program Recommendations  AACE/ADA: New Consensus Statement on Inpatient Glycemic Control (2015)  Target Ranges:  Prepandial:   less than 140 mg/dL      Peak postprandial:   less than 180 mg/dL (1-2 hours)      Critically ill patients:  140 - 180 mg/dL   Lab Results  Component Value Date   GLUCAP 134 (H) 05/23/2023   HGBA1C 9.7 (H) 01/29/2023    Review of Glycemic Control  Diabetes history: DM1 Outpatient Diabetes medications: Lantus 30 every day, Novolog 10 units TID Current orders for Inpatient glycemic control: Semglee 20 every day, Novolog 0-15 TID with meals and 0-5 HS  HgbA1C - 9.7% 6th ED/admission visit this year.  Inpatient Diabetes Program Recommendations:    Consider increasing Semglee to 25 units every day  Consider adding Novolog 4 units TID with meals if eating CHO-mod diet  Spoke with pt at bedside regarding his diabetes and HgbA1C of 9.7%. Pt reports taking meds as prescribed and blood sugars usually around 160  or so. Discussed A1C results (9.7% on ) and explained that current A1C indicates an average glucose of 232 mg/dl over the past 2-3 months. Discussed glucose and A1C goals. Discussed importance of checking CBGs and maintaining good CBG control to prevent long-term and short-term complications. Explained how hyperglycemia leads to damage within blood vessels which lead to the common complications seen with uncontrolled diabetes. Stressed to the patient the importance of improving glycemic control to prevent further complications from uncontrolled diabetes. Discussed impact of nutrition, exercise, stress, sickness, and medications on diabetes control. Answered all questions. Pt appreciative of visit.   Continue to follow.  Thank you. Ailene Ards, RD, LDN, CDCES Inpatient Diabetes Coordinator [phone]

## 2023-05-23 NOTE — Hospital Course (Addendum)
HPI: Edward Walls is a 42 y.o. male with medical history significant for type 1 diabetes, small intestinal bacterial overgrowth, marijuana use intermittent chronic nausea and vomiting being admitted to the hospital with recurrent abdominal discomfort, intractable nausea and vomiting for the last 3 to 4 days.  He has had multiple hospital admissions for intractable nausea vomiting, as well as DKA.  He presented to the emergency department on 8/5 with intractable nausea vomiting abdominal pain.  Was found to not be in DKA, was given IV fluids, supportive care felt better and discharged home.  However he came back this morning to [facility], ER with continued complaints of chest pain, intractable vomiting.  He denies any fevers or chills, cough, chest pain, shortness of breath.  No hematemesis or coffee-ground's.  Tells me that his pain is similar to prior bouts of intractable vomiting.  Last bowel movement was about 5 days ago.   ED Course: In the emergency department, vital signs are unremarkable.  CBC unremarkable except for hemoglobin of 10.5, which is a new finding for him.  Blood sugar this morning 310, otherwise BMP relatively unremarkable.  He was given a dose of IV calcium gluconate due to mild hypocalcemia.   **Interim History Patient continues to be nauseous and vomiting and so added Compazine.  Diabetes education coordinator consulted and adjusting medications as necessary. Continues to be nauseous and now having back pain. Will continue scheduled Antiemetics and if not improving will obtain GI Consultation in the AM but patient willing to try advanced Diet today. Nursing states his appetite is very poor so have consulted Nutrition.   Assessment and Plan:  Intractable Nausea and Vomiting -No evidence of acute process, especially considering CT scan with no acute findings.   -Likely cannabinoid hyperemesis given Positive TCH, versus gastroparesis. -C/w Observation admission -Supportive  care with IV fluids, pain and nausea medications as needed -I have added Compazine in addition to his Zofran given his nausea and vomiting this morning -Clear liquid diet and advance as tolerated and advanced to Full if Tolerated; Unable to tolerate Soft Diet yesterday so cut back to FULL but willing to try Soft Diet today  -Started On IV Metaclopramide 10 mg q8h for now and will continue -Give an additional 1.5 Liter bolus today  -Lipase Level Trend: Recent Labs  Lab 05/21/23 1347 05/22/23 0924 05/24/23 1100  LIPASE 25 30 20   -Advance diet as tolerated if tolerating p.o.'s without issues likely can be discharged but he was not able to tolerate Soft Diet yet -If not improving after 48 hours will get GI formally consulted    Uncontrolled Type 1 Diabetes Mellitus - Hyperglycemia without Metabolic Acidosis but has Ketosis  -Carb controlled diet as it is advanced -Beta-Hydroxybutyric Acid Trend: 4.35 -> 1.66 -> 3.11 -> 2.26 -> 3.15 -Resume his home basal insulin at reduced dose and is now on 20 units but will go to 25 units of Semglee and also on Moderate dose sliding scale insulin AC/HS -Will aslo add 4 units of Novolog TID with Meals if eating the Carb Modified Diet -CBG Trend: Recent Labs  Lab 05/24/23 2053 05/25/23 0745 05/25/23 1131 05/25/23 1625 05/25/23 2209 05/26/23 0744 05/26/23 1203  GLUCAP 104* 171* 162* 99 115* 118* 150*  Glucose Trend: Recent Labs  Lab 05/22/23 0008 05/22/23 1200 05/22/23 1310 05/23/23 0847 05/24/23 0614 05/25/23 0526 05/26/23 0544  GLUCOSE 297* 224* 257* 215* 163* 174* 128*   Hyponatremia -Mild. Na+ Trend: Recent Labs  Lab 05/22/23 0008 05/22/23  1200 05/22/23 1310 05/23/23 0847 05/24/23 0614 05/25/23 0526 05/26/23 0544  NA 135 140 137 131* 133* 129* 132*  -C/w IVF with NS + 20 mEQ of KCL at 100 mL/hr but reduce the rate to 75 mL/hr -Repeat CMP in the AM    Hypocalcemia -Sstatus post IV replacement in the ER -Calcium Level  Trend: Recent Labs  Lab 05/22/23 0008 05/22/23 1200 05/22/23 1310 05/23/23 0847 05/24/23 0614 05/25/23 0526 05/26/23 0544  CALCIUM 8.7* 5.9* 7.0* 8.1* 8.4* 8.6* 8.6*  -Continue to Monitor and Trend and repeat CMP in the AM   Hypokalemia -Patient's K+ Level Trend: Recent Labs  Lab 05/22/23 0008 05/22/23 1200 05/22/23 1310 05/23/23 0847 05/24/23 0614 05/25/23 0526 05/26/23 0544  K 3.9 3.2* 3.4* 3.4* 3.6 4.0 3.7  -Replete with IV K Phos 20 mmol yesterday; Reduce NS with 20 mEQ KCL at 100 mL/hr to 75 mL/hr -Continue to Monitor and Replete as Necessary -Repeat CMP in the AM   Back Pain, improved -Obtain Imaging with T Spine and L Spine X-Ray and showed No acute osseous abnormalities  -K Pad ordered  Hypophosphatemia -Phos Level Trend: Recent Labs  Lab 05/23/23 0847 05/24/23 0614 05/25/23 0526 05/26/23 0544  PHOS 2.2* 3.2 3.2 3.6  -Replete with IV K Phos 20 mmol yesterday -Continue to Monitor and Replete as Necessary -Repeat Phos Level in the AM   Normocytic Anemia  -Hgb/Hct Trend: Recent Labs  Lab 05/21/23 1347 05/21/23 2145 05/22/23 1310 05/23/23 0847 05/24/23 0614 05/25/23 0526 05/26/23 0544  HGB 13.0 13.6 10.5* 12.4* 13.5 14.1 13.8  HCT 40.5 40.0 33.2* 38.3* 40.6 43.2 42.3  MCV 85.3  --  86.2 86.3 82.9 82.9 83.8  -Checking Hemoccult -Checked Anemia Panel and showed an iron level of 66, UIBC 192, TIBC 258, saturation ratios of 26%, ferritin level 177, folate level 15.2 and vitamin B12 level 411 -Denies any Melena or Hematemesis -Could be a spurious result so rechecking -Continue to to Monitor for S/Sx of Bleeding; No overt bleeding noted -Repeat CBC in the AM   Hyperbilirubinemia -Bilirubin Trend: Recent Labs  Lab 05/21/23 1347 05/22/23 0008 05/23/23 0847 05/24/23 0614 05/25/23 0526 05/26/23 0544  BILITOT 2.1* 1.9* 2.6* 2.1* 2.4* 1.9*  -C/w IVF Hydration; continue IVF Bolus again -Continue to Monitor and Trend and repeat CMP in the  AM  Non-Severe (Moderate) Malnutrition in the Context of Chronic illness, Underweight -Nutrition Status: Nutrition Problem: Moderate Malnutrition Etiology: chronic illness (uncontrolled diabetes) Signs/Symptoms: moderate fat depletion, moderate muscle depletion, energy intake < or equal to 75% for > or equal to 1 month Interventions: Glucerna shake, MVI  Hypoalbuminemia -Patient's Albumin Trend: Recent Labs  Lab 05/21/23 1347 05/22/23 0008 05/23/23 0847 05/24/23 0614 05/25/23 0526 05/26/23 0544  ALBUMIN 3.9 3.4* 3.4* 3.5 3.9 3.6  -Continue to Monitor and Trend and repeat CMP in the AM

## 2023-05-23 NOTE — Plan of Care (Signed)
  Problem: Education: Goal: Knowledge of General Education information will improve Description: Including pain rating scale, medication(s)/side effects and non-pharmacologic comfort measures 05/23/2023 0142 by Mora Bellman, RN Outcome: Progressing 05/23/2023 0142 by Mora Bellman, RN Outcome: Progressing   Problem: Health Behavior/Discharge Planning: Goal: Ability to manage health-related needs will improve Outcome: Progressing   Problem: Coping: Goal: Level of anxiety will decrease 05/23/2023 0142 by Mora Bellman, RN Outcome: Progressing 05/23/2023 0142 by Mora Bellman, RN Outcome: Progressing   Problem: Elimination: Goal: Will not experience complications related to bowel motility Outcome: Progressing

## 2023-05-24 ENCOUNTER — Inpatient Hospital Stay (HOSPITAL_COMMUNITY): Payer: Managed Care, Other (non HMO)

## 2023-05-24 DIAGNOSIS — R112 Nausea with vomiting, unspecified: Secondary | ICD-10-CM | POA: Diagnosis not present

## 2023-05-24 DIAGNOSIS — Z79899 Other long term (current) drug therapy: Secondary | ICD-10-CM | POA: Diagnosis not present

## 2023-05-24 DIAGNOSIS — E8809 Other disorders of plasma-protein metabolism, not elsewhere classified: Secondary | ICD-10-CM | POA: Diagnosis present

## 2023-05-24 DIAGNOSIS — Z1152 Encounter for screening for COVID-19: Secondary | ICD-10-CM | POA: Diagnosis not present

## 2023-05-24 DIAGNOSIS — E860 Dehydration: Secondary | ICD-10-CM | POA: Diagnosis present

## 2023-05-24 DIAGNOSIS — M549 Dorsalgia, unspecified: Secondary | ICD-10-CM | POA: Diagnosis present

## 2023-05-24 DIAGNOSIS — Z91148 Patient's other noncompliance with medication regimen for other reason: Secondary | ICD-10-CM | POA: Diagnosis not present

## 2023-05-24 DIAGNOSIS — D649 Anemia, unspecified: Secondary | ICD-10-CM | POA: Diagnosis not present

## 2023-05-24 DIAGNOSIS — M545 Low back pain, unspecified: Secondary | ICD-10-CM | POA: Diagnosis not present

## 2023-05-24 DIAGNOSIS — Z681 Body mass index (BMI) 19 or less, adult: Secondary | ICD-10-CM | POA: Diagnosis not present

## 2023-05-24 DIAGNOSIS — Z91010 Allergy to peanuts: Secondary | ICD-10-CM | POA: Diagnosis not present

## 2023-05-24 DIAGNOSIS — E871 Hypo-osmolality and hyponatremia: Secondary | ICD-10-CM | POA: Diagnosis present

## 2023-05-24 DIAGNOSIS — N179 Acute kidney failure, unspecified: Secondary | ICD-10-CM | POA: Diagnosis present

## 2023-05-24 DIAGNOSIS — G8929 Other chronic pain: Secondary | ICD-10-CM | POA: Diagnosis present

## 2023-05-24 DIAGNOSIS — Z87891 Personal history of nicotine dependence: Secondary | ICD-10-CM | POA: Diagnosis not present

## 2023-05-24 DIAGNOSIS — J302 Other seasonal allergic rhinitis: Secondary | ICD-10-CM | POA: Diagnosis present

## 2023-05-24 DIAGNOSIS — E876 Hypokalemia: Secondary | ICD-10-CM | POA: Diagnosis present

## 2023-05-24 DIAGNOSIS — Z91018 Allergy to other foods: Secondary | ICD-10-CM | POA: Diagnosis not present

## 2023-05-24 DIAGNOSIS — Z794 Long term (current) use of insulin: Secondary | ICD-10-CM | POA: Diagnosis not present

## 2023-05-24 DIAGNOSIS — F12188 Cannabis abuse with other cannabis-induced disorder: Secondary | ICD-10-CM | POA: Diagnosis present

## 2023-05-24 DIAGNOSIS — R17 Unspecified jaundice: Secondary | ICD-10-CM | POA: Diagnosis not present

## 2023-05-24 DIAGNOSIS — E440 Moderate protein-calorie malnutrition: Secondary | ICD-10-CM | POA: Diagnosis present

## 2023-05-24 DIAGNOSIS — E8889 Other specified metabolic disorders: Secondary | ICD-10-CM | POA: Diagnosis present

## 2023-05-24 DIAGNOSIS — E1065 Type 1 diabetes mellitus with hyperglycemia: Secondary | ICD-10-CM | POA: Diagnosis present

## 2023-05-24 LAB — GLUCOSE, CAPILLARY
Glucose-Capillary: 148 mg/dL — ABNORMAL HIGH (ref 70–99)
Glucose-Capillary: 130 mg/dL — ABNORMAL HIGH (ref 70–99)
Glucose-Capillary: 156 mg/dL — ABNORMAL HIGH (ref 70–99)
Glucose-Capillary: 104 mg/dL — ABNORMAL HIGH (ref 70–99)

## 2023-05-24 LAB — BETA-HYDROXYBUTYRIC ACID: Beta-Hydroxybutyric Acid: 2.26 mmol/L — ABNORMAL HIGH (ref 0.05–0.27)

## 2023-05-24 LAB — LIPASE, BLOOD: Lipase: 20 U/L (ref 11–51)

## 2023-05-24 MED ORDER — POTASSIUM CHLORIDE CRYS ER 20 MEQ PO TBCR
40.0000 meq | EXTENDED_RELEASE_TABLET | Freq: Two times a day (BID) | ORAL | Status: AC
  Administered 2023-05-24 (×2): 40 meq via ORAL
  Filled 2023-05-24 (×2): qty 2

## 2023-05-24 MED ORDER — SODIUM CHLORIDE 0.9 % IV BOLUS
1000.0000 mL | Freq: Once | INTRAVENOUS | Status: AC
  Administered 2023-05-24: 1000 mL via INTRAVENOUS

## 2023-05-24 MED ORDER — METOCLOPRAMIDE HCL 5 MG/ML IJ SOLN
10.0000 mg | Freq: Three times a day (TID) | INTRAMUSCULAR | Status: DC
  Administered 2023-05-24 – 2023-05-26 (×6): 10 mg via INTRAVENOUS
  Filled 2023-05-24 (×6): qty 2

## 2023-05-24 NOTE — Progress Notes (Signed)
PROGRESS NOTE    Edward Walls  RUE:[id number] DOB: 08/03/1981 DOA: 05/22/2023 PCP: Mliss Sax, MD   Brief Narrative:  HPI: Edward Walls is a 42 y.o. male with medical history significant for type 1 diabetes, small intestinal bacterial overgrowth, marijuana use intermittent chronic nausea and vomiting being admitted to the hospital with recurrent abdominal discomfort, intractable nausea and vomiting for the last 3 to 4 days.  He has had multiple hospital admissions for intractable nausea vomiting, as well as DKA.  He presented to the emergency department on 8/5 with intractable nausea vomiting abdominal pain.  Was found to not be in DKA, was given IV fluids, supportive care felt better and discharged home.  However he came back this morning to [facility], ER with continued complaints of chest pain, intractable vomiting.  He denies any fevers or chills, cough, chest pain, shortness of breath.  No hematemesis or coffee-ground's.  Tells me that his pain is similar to prior bouts of intractable vomiting.  Last bowel movement was about 5 days ago.   ED Course: In the emergency department, vital signs are unremarkable.  CBC unremarkable except for hemoglobin of 10.5, which is a new finding for him.  Blood sugar this morning 310, otherwise BMP relatively unremarkable.  He was given a dose of IV calcium gluconate due to mild hypocalcemia.   **Interim History Patient continues to be nauseous and vomiting and so added Compazine.  Diabetes education coordinator consulted and adjusting medications as necessary. Continues to be nauseous and now having back pain  Assessment and Plan:  Intractable nausea and Vomiting -No evidence of acute process, especially considering CT scan with no acute findings.   -Likely cannabinoid hyperemesis given Positive TCH, versus gastroparesis. -C/w Observation admission -Supportive care with IV fluids, pain and nausea medications as needed -I have added  Compazine in addition to his Zofran given his nausea and vomiting this morning -Clear liquid diet and advance as tolerated and advanced to Full if Tolerated; Unable to tolerate Soft Diet so cut back to FULL -Started On IV Metaclopramide 10 mg q8h for now -Give an additional 1 Liter bolus again  -Advance diet as tolerated if tolerating p.o.'s without issues likely can be discharged but he was not able to tolerate Soft Diet yet -If not improving after 48 hours will get GI formally consulted    Uncontrolled Type 1 Diabetes Mellitus - Hyperglycemia without Metabolic Acidosis but has Ketosis  -Carb controlled diet as it is advanced -Beta-Hydroxybutyric Acid Trend: 4.35 -> 1.66 -> 3.11 -> 2.26 -Resume his home basal insulin at reduced dose and is now on 20 units but will go to 25 units of Semglee and also on Moderate dose sliding scale insulin AC/HS -Will aslo add 4 units of Novolog TID with Meals if eating the Carb Modified Diet -CBG Trend: Recent Labs  Lab 05/22/23 2202 05/23/23 0738 05/23/23 1141 05/23/23 1653 05/23/23 2130 05/24/23 0739 05/24/23 1156  GLUCAP 224* 227* 134* 94 115* 148* 130*  Glucose Trend: Recent Labs  Lab 05/21/23 1347 05/22/23 0008 05/22/23 1200 05/22/23 1310 05/23/23 0847 05/24/23 0614  GLUCOSE 263* 297* 224* 257* 215* 163*   Hyponatremia -Mild. Na+ Trend: Recent Labs  Lab 05/21/23 1347 05/21/23 2145 05/22/23 0008 05/22/23 1200 05/22/23 1310 05/23/23 0847 05/24/23 0614  NA 137 134* 135 140 137 131* 133*  -C/w IVF with NS + 20 mEQ of KCL at 100 mL/hr but reduce the rate to 75 mL/hr -Repeat CMP in the AM  Hypocalcemia -status post IV replacement in the ER -Calcium Level Trend: Recent Labs  Lab 05/21/23 1347 05/22/23 0008 05/22/23 1200 05/22/23 1310 05/23/23 0847 05/24/23 0614  CALCIUM 9.1 8.7* 5.9* 7.0* 8.1* 8.4*  -Continue to Monitor and Trend and repeat CMP in the AM   Hypokalemia -Patient's K+ Level Trend: Recent Labs  Lab  05/21/23 1347 05/21/23 2145 05/22/23 0008 05/22/23 1200 05/22/23 1310 05/23/23 0847 05/24/23 0614  K 3.9 3.8 3.9 3.2* 3.4* 3.4* 3.6  -Replete with IV K Phos 20 mmol yesterday; Reduce NS with 20 mEQ KCL at 100 mL/hr to 75 mL/hr -Continue to Monitor and Replete as Necessary -Repeat CMP in the AM   Back Pain -Obtain Imaging with T Spine and L Spine X-Ray -K Pad ordered  Hypophosphatemia -Phos Level Trend: Recent Labs  Lab 05/23/23 0847 05/24/23 0614  PHOS 2.2* 3.2  -Replete with IV K Phos 20 mmol yesterday -Continue to Monitor and Replete as Necessary -Repeat Phos Level in the AM   Normocytic Anemia  -Hgb/Hct Trend: Recent Labs  Lab 05/21/23 1347 05/21/23 2145 05/22/23 1310 05/23/23 0847 05/24/23 0614  HGB 13.0 13.6 10.5* 12.4* 13.5  HCT 40.5 40.0 33.2* 38.3* 40.6  MCV 85.3  --  86.2 86.3 82.9  -Checking Hemoccult -Check Anemia Panel in the AM -Denies any Melena or Hematemesis -Could be a spurious result so rechecking -Continue to to Monitor for S/Sx of Bleeding; No overt bleeding noted -Repeat CBC in the AM   Hyperbilirubinemia -Bilirubin Trend: Recent Labs  Lab 05/21/23 1347 05/22/23 0008 05/23/23 0847 05/24/23 0614  BILITOT 2.1* 1.9* 2.6* 2.1*  -C/w IVF Hydration; continue IVF Bolus again -Continue to Monitor and Trend and repeat CMP in the AM   Hypoalbuminemia -Patient's Albumin Trend: Recent Labs  Lab 05/21/23 1347 05/22/23 0008 05/23/23 0847 05/24/23 0614  ALBUMIN 3.9 3.4* 3.4* 3.5  -Continue to Monitor and Trend and repeat CMP in the AM   DVT prophylaxis: enoxaparin (LOVENOX) injection 40 mg Start: 05/22/23 2200 SCDs Start: 05/22/23 1527    Code Status: Full Code Family Communication: No family present at bedside  Disposition Plan:  Level of care: Med-Surg Status is: Inpatient Remains inpatient appropriate because: Needs further clinical improvement   Consultants:  Discussed with GI  Procedures:  As delineated as  above  Antimicrobials:  Anti-infectives (From admission, onward)    Start     Dose/Rate Route Frequency Ordered Stop   05/22/23 1600  rifaximin (XIFAXAN) tablet 550 mg        550 mg Oral 3 times daily 05/22/23 1527         Subjective: Seen and examined at bedside and was feeling nauseous. States he was doing good this AM but started feeling bad again. Unable to tolerate Soft Diet and now having back pain in the Lower Thoracic and Lumbar area.   Objective: Vitals:   05/23/23 0939 05/23/23 1931 05/24/23 0619 05/24/23 1329  BP: (!) 144/94 (!) 153/101 (!) 151/99 139/85  Pulse: 96 70 77 83  Resp: 17 18 17 20   Temp: 99 F (37.2 C) 98.2 F (36.8 C) 98.8 F (37.1 C) 97.9 F (36.6 C)  TempSrc:  Oral Oral   SpO2: 100% 100% 100% 97%  Weight:      Height:        Intake/Output Summary (Last 24 hours) at 05/24/2023 1452 Last data filed at 05/24/2023 0330 Gross per 24 hour  Intake 1634.72 ml  Output 2900 ml  Net -1265.28 ml   [employer]  05/22/23 0729  Weight: 65.2 kg   Examination: Physical Exam:  Constitutional: Thin cachectic AAM who appears uncomfortable  Respiratory: Diminished to auscultation bilaterally, no wheezing, rales, rhonchi or crackles. Normal respiratory effort and patient is not tachypenic. No accessory muscle use. Unlabored breathing  Cardiovascular: RRR, no murmurs / rubs / gallops. S1 and S2 auscultated. No extremity edema. Abdomen: Soft, mildly-tender, non-distended. Bowel sounds positive.  GU: Deferred. Musculoskeletal: No clubbing / cyanosis of digits/nails. No joint deformity upper and lower extremities. Skin: No rashes, lesions, ulcers on a limited skin evaluation. No induration; Warm and dry.  Neurologic: CN 2-12 grossly intact with no focal deficits. Romberg sign and cerebellar reflexes not assessed.  Psychiatric: Normal judgment and insight. Alert and oriented x 3. Normal mood and appropriate affect.   Data Reviewed: I have personally reviewed  following labs and imaging studies  CBC: Recent Labs  Lab 05/21/23 1347 05/21/23 2145 05/22/23 1310 05/23/23 0847 05/24/23 0614  WBC 8.0  --  6.2 5.9 4.8  NEUTROABS 6.0  --  3.9 3.9 2.9  HGB 13.0 13.6 10.5* 12.4* 13.5  HCT 40.5 40.0 33.2* 38.3* 40.6  MCV 85.3  --  86.2 86.3 82.9  PLT 313  --  220 264 283   Basic Metabolic Panel: Recent Labs  Lab 05/21/23 1939 05/21/23 2145 05/22/23 0008 05/22/23 1200 05/22/23 1310 05/23/23 0847 05/24/23 0614  NA  --    < > 135 140 137 131* 133*  K  --    < > 3.9 3.2* 3.4* 3.4* 3.6  CL  --   --  97* 112* 105 96* 97*  CO2  --   --  23 20* 23 24 24   GLUCOSE  --   --  297* 224* 257* 215* 163*  BUN  --   --  20 13 14 9 6   CREATININE  --   --  1.25* 0.48* 0.61 0.60* 0.55*  CALCIUM  --   --  8.7* 5.9* 7.0* 8.1* 8.4*  MG 2.1  --   --   --   --  1.8 2.0  PHOS  --   --   --   --   --  2.2* 3.2   < > = values in this interval not displayed.   GFR: Estimated Creatinine Clearance: 113.2 mL/min (A) (by C-G formula based on SCr of 0.55 mg/dL (L)). Liver Function Tests: Recent Labs  Lab 05/21/23 1347 05/22/23 0008 05/23/23 0847 05/24/23 0614  AST 20 14* 13* 14*  ALT 17 18 14 14   ALKPHOS 42 40 35* 39  BILITOT 2.1* 1.9* 2.6* 2.1*  PROT 6.5 5.9* 5.6* 5.9*  ALBUMIN 3.9 3.4* 3.4* 3.5   Recent Labs  Lab 05/21/23 1347 05/22/23 0924 05/24/23 1100  LIPASE 25 30 20    No results for input(s): "AMMONIA" in the last 168 hours. Coagulation Profile: No results for input(s): "INR", "PROTIME" in the last 168 hours. Cardiac Enzymes: No results for input(s): "CKTOTAL", "CKMB", "CKMBINDEX", "TROPONINI" in the last 168 hours. BNP (last 3 results) No results for input(s): "PROBNP" in the last 8760 hours. HbA1C: No results for input(s): "HGBA1C" in the last 72 hours. CBG: Recent Labs  Lab 05/23/23 1141 05/23/23 1653 05/23/23 2130 05/24/23 0739 05/24/23 1156  GLUCAP 134* 94 115* 148* 130*   Lipid Profile: No results for input(s): "CHOL",  "HDL", "LDLCALC", "TRIG", "CHOLHDL", "LDLDIRECT" in the last 72 hours. Thyroid Function Tests: No results for input(s): "TSH", "T4TOTAL", "FREET4", "T3FREE", "THYROIDAB" in the last 72 hours.  Anemia Panel: No results for input(s): "VITAMINB12", "FOLATE", "FERRITIN", "TIBC", "IRON", "RETICCTPCT" in the last 72 hours. Sepsis Labs: Recent Labs  Lab 05/22/23 0934  LATICACIDVEN 1.4   Recent Results (from the past 240 hour(s))  Resp panel by RT-PCR (RSV, Flu A&B, Covid) Anterior Nasal Swab     Status: None   Collection Time: 05/21/23  1:48 PM   Specimen: Anterior Nasal Swab  Result Value Ref Range Status   SARS Coronavirus 2 by RT PCR NEGATIVE NEGATIVE Final   Influenza A by PCR NEGATIVE NEGATIVE Final   Influenza B by PCR NEGATIVE NEGATIVE Final    Comment: (NOTE) The Xpert Xpress SARS-CoV-2/FLU/RSV plus assay is intended as an aid in the diagnosis of influenza from Nasopharyngeal swab specimens and should not be used as a sole basis for treatment. Nasal washings and aspirates are unacceptable for Xpert Xpress SARS-CoV-2/FLU/RSV testing.  Fact Sheet for Patients: BloggerCourse.com  Fact Sheet for Healthcare Providers: SeriousBroker.it  This test is not yet approved or cleared by the [place] FDA and has been authorized for detection and/or diagnosis of SARS-CoV-2 by FDA under an Emergency Use Authorization (EUA). This EUA will remain in effect (meaning this test can be used) for the duration of the COVID-19 declaration under Section 564(b)(1) of the Act, 21 U.S.C. section 360bbb-3(b)(1), unless the authorization is terminated or revoked.     Resp Syncytial Virus by PCR NEGATIVE NEGATIVE Final    Comment: (NOTE) Fact Sheet for Patients: BloggerCourse.com  Fact Sheet for Healthcare Providers: SeriousBroker.it  This test is not yet approved or cleared by the [place]  FDA and has been authorized for detection and/or diagnosis of SARS-CoV-2 by FDA under an Emergency Use Authorization (EUA). This EUA will remain in effect (meaning this test can be used) for the duration of the COVID-19 declaration under Section 564(b)(1) of the Act, 21 U.S.C. section 360bbb-3(b)(1), unless the authorization is terminated or revoked.  Performed at [facility], [address] [place], [place] [address]     Radiology Studies: DG Lumbar Spine 2-3 Views  Result Date: 05/24/2023 CLINICAL DATA:  Back pain EXAM: THORACIC SPINE 2 VIEWS LUMBAR SPINE - 2 VIEW COMPARISON:  None Available. FINDINGS: Five lumbar-type vertebral bodies. Vertebral body heights are maintained. Disc spaces are preserved. Alignment is normal. No other significant bone abnormalities are identified. IMPRESSION: No acute osseous abnormality Electronically Signed   By: Lorenza Cambridge M.D.   On: 05/24/2023 13:47   DG Thoracic Spine 2 View  Result Date: 05/24/2023 CLINICAL DATA:  Back pain EXAM: THORACIC SPINE 2 VIEWS LUMBAR SPINE - 2 VIEW COMPARISON:  None Available. FINDINGS: Five lumbar-type vertebral bodies. Vertebral body heights are maintained. Disc spaces are preserved. Alignment is normal. No other significant bone abnormalities are identified. IMPRESSION: No acute osseous abnormality Electronically Signed   By: Lorenza Cambridge M.D.   On: 05/24/2023 13:47    Scheduled Meds:  enoxaparin (LOVENOX) injection  40 mg Subcutaneous Q24H   escitalopram  20 mg Oral Daily   insulin aspart  0-15 Units Subcutaneous TID WC   insulin aspart  0-5 Units Subcutaneous QHS   insulin glargine-yfgn  20 Units Subcutaneous Daily   metoCLOPramide (REGLAN) injection  10 mg Intravenous Q8H   pantoprazole (PROTONIX) IV  40 mg Intravenous Daily   potassium chloride  40 mEq Oral BID   rifaximin  550 mg Oral TID   Continuous Infusions:  sodium chloride 10 mL/hr (05/23/23 2013)   0.9 % NaCl with KCl 20  mEq / L 75 mL/hr at  05/24/23 1130    LOS: 0 days   Marguerita Merles, DO [facility] Available via Epic secure chat 7am-7pm After these hours, please refer to coverage provider listed on [email or web address] 05/24/2023, 2:52 PM

## 2023-05-24 NOTE — Plan of Care (Signed)

## 2023-05-24 NOTE — Plan of Care (Signed)

## 2023-05-25 ENCOUNTER — Telehealth: Payer: Self-pay

## 2023-05-25 DIAGNOSIS — E440 Moderate protein-calorie malnutrition: Secondary | ICD-10-CM | POA: Diagnosis not present

## 2023-05-25 DIAGNOSIS — E1065 Type 1 diabetes mellitus with hyperglycemia: Secondary | ICD-10-CM | POA: Diagnosis not present

## 2023-05-25 DIAGNOSIS — R112 Nausea with vomiting, unspecified: Secondary | ICD-10-CM | POA: Diagnosis not present

## 2023-05-25 LAB — BETA-HYDROXYBUTYRIC ACID: Beta-Hydroxybutyric Acid: 3.15 mmol/L — ABNORMAL HIGH (ref 0.05–0.27)

## 2023-05-25 LAB — GLUCOSE, CAPILLARY
Glucose-Capillary: 171 mg/dL — ABNORMAL HIGH (ref 70–99)
Glucose-Capillary: 162 mg/dL — ABNORMAL HIGH (ref 70–99)
Glucose-Capillary: 99 mg/dL (ref 70–99)
Glucose-Capillary: 115 mg/dL — ABNORMAL HIGH (ref 70–99)

## 2023-05-25 MED ORDER — GLUCERNA SHAKE PO LIQD
237.0000 mL | Freq: Three times a day (TID) | ORAL | Status: DC
  Administered 2023-05-25: 237 mL via ORAL
  Filled 2023-05-25 (×5): qty 237

## 2023-05-25 MED ORDER — ADULT MULTIVITAMIN W/MINERALS CH
1.0000 | ORAL_TABLET | Freq: Every day | ORAL | Status: DC
  Administered 2023-05-25 – 2023-05-26 (×2): 1 via ORAL
  Filled 2023-05-25 (×2): qty 1

## 2023-05-25 MED ORDER — SODIUM CHLORIDE 0.9 % IV BOLUS
500.0000 mL | Freq: Once | INTRAVENOUS | Status: AC
  Administered 2023-05-25: 500 mL via INTRAVENOUS

## 2023-05-25 MED ORDER — SODIUM CHLORIDE 0.9 % IV BOLUS
1000.0000 mL | Freq: Once | INTRAVENOUS | Status: AC
  Administered 2023-05-25: 1000 mL via INTRAVENOUS

## 2023-05-25 NOTE — Progress Notes (Signed)
PROGRESS NOTE    JONMARC KOLKMAN  WUJ:[id number] DOB: [birth year] DOA: 05/22/2023 PCP: Mliss Sax, MD   Brief Narrative:  HPI: Edward Walls is a 42 y.o. male with medical history significant for type 1 diabetes, small intestinal bacterial overgrowth, marijuana use intermittent chronic nausea and vomiting being admitted to the hospital with recurrent abdominal discomfort, intractable nausea and vomiting for the last 3 to 4 days.  He has had multiple hospital admissions for intractable nausea vomiting, as well as DKA.  He presented to the emergency department on 8/5 with intractable nausea vomiting abdominal pain.  Was found to not be in DKA, was given IV fluids, supportive care felt better and discharged home.  However he came back this morning to [facility], ER with continued complaints of chest pain, intractable vomiting.  He denies any fevers or chills, cough, chest pain, shortness of breath.  No hematemesis or coffee-ground's.  Tells me that his pain is similar to prior bouts of intractable vomiting.  Last bowel movement was about 5 days ago.   ED Course: In the emergency department, vital signs are unremarkable.  CBC unremarkable except for hemoglobin of 10.5, which is a new finding for him.  Blood sugar this morning 310, otherwise BMP relatively unremarkable.  He was given a dose of IV calcium gluconate due to mild hypocalcemia.   **Interim History Patient continues to be nauseous and vomiting and so added Compazine.  Diabetes education coordinator consulted and adjusting medications as necessary. Continues to be nauseous and now having back pain. Will continue scheduled Antiemetics and if not improving will obtain GI Consultation in the AM but patient willing to try advanced Diet today. Nursing states his appetite is very poor so have consulted Nutrition.   Assessment and Plan:  Intractable Nausea and Vomiting -No evidence of acute process, especially considering CT scan with  no acute findings.   -Likely cannabinoid hyperemesis given Positive TCH, versus gastroparesis. -C/w Observation admission -Supportive care with IV fluids, pain and nausea medications as needed -I have added Compazine in addition to his Zofran given his nausea and vomiting this morning -Clear liquid diet and advance as tolerated and advanced to Full if Tolerated; Unable to tolerate Soft Diet yesterday so cut back to FULL but willing to try Soft Diet today  -Started On IV Metaclopramide 10 mg q8h for now and will continue -Give an additional 1.5 Liter bolus today  -Lipase Level Trend: Recent Labs  Lab 05/21/23 1347 05/22/23 0924 05/24/23 1100  LIPASE 25 30 20   -Advance diet as tolerated if tolerating p.o.'s without issues likely can be discharged but he was not able to tolerate Soft Diet yet -If not improving after 48 hours will get GI formally consulted    Uncontrolled Type 1 Diabetes Mellitus - Hyperglycemia without Metabolic Acidosis but has Ketosis  -Carb controlled diet as it is advanced -Beta-Hydroxybutyric Acid Trend: 4.35 -> 1.66 -> 3.11 -> 2.26 -> 3.15 -Resume his home basal insulin at reduced dose and is now on 20 units but will go to 25 units of Semglee and also on Moderate dose sliding scale insulin AC/HS -Will aslo add 4 units of Novolog TID with Meals if eating the Carb Modified Diet -CBG Trend: Recent Labs  Lab 05/24/23 0739 05/24/23 1156 05/24/23 1813 05/24/23 2053 05/25/23 0745 05/25/23 1131 05/25/23 1625  GLUCAP 148* 130* 156* 104* 171* 162* 99  Glucose Trend: Recent Labs  Lab 05/21/23 1347 05/22/23 0008 05/22/23 1200 05/22/23 1310 05/23/23 0847 05/24/23 9562  05/25/23 0526  GLUCOSE 263* 297* 224* 257* 215* 163* 174*   Hyponatremia -Mild. Na+ Trend: Recent Labs  Lab 05/21/23 2145 05/22/23 0008 05/22/23 1200 05/22/23 1310 05/23/23 0847 05/24/23 0614 05/25/23 0526  NA 134* 135 140 137 131* 133* 129*  -C/w IVF with NS + 20 mEQ of KCL at 100 mL/hr  but reduce the rate to 75 mL/hr -Repeat CMP in the AM    Hypocalcemia -Sstatus post IV replacement in the ER -Calcium Level Trend: Recent Labs  Lab 05/21/23 1347 05/22/23 0008 05/22/23 1200 05/22/23 1310 05/23/23 0847 05/24/23 0614 05/25/23 0526  CALCIUM 9.1 8.7* 5.9* 7.0* 8.1* 8.4* 8.6*  -Continue to Monitor and Trend and repeat CMP in the AM   Hypokalemia -Patient's K+ Level Trend: Recent Labs  Lab 05/21/23 2145 05/22/23 0008 05/22/23 1200 05/22/23 1310 05/23/23 0847 05/24/23 0614 05/25/23 0526  K 3.8 3.9 3.2* 3.4* 3.4* 3.6 4.0  -Replete with IV K Phos 20 mmol yesterday; Reduce NS with 20 mEQ KCL at 100 mL/hr to 75 mL/hr -Continue to Monitor and Replete as Necessary -Repeat CMP in the AM   Back Pain, improved -Obtain Imaging with T Spine and L Spine X-Ray and showed No acute osseous abnormalities  -K Pad ordered  Hypophosphatemia -Phos Level Trend: Recent Labs  Lab 05/23/23 0847 05/24/23 0614 05/25/23 0526  PHOS 2.2* 3.2 3.2  -Replete with IV K Phos 20 mmol yesterday -Continue to Monitor and Replete as Necessary -Repeat Phos Level in the AM   Normocytic Anemia  -Hgb/Hct Trend: Recent Labs  Lab 05/21/23 1347 05/21/23 2145 05/22/23 1310 05/23/23 0847 05/24/23 0614 05/25/23 0526  HGB 13.0 13.6 10.5* 12.4* 13.5 14.1  HCT 40.5 40.0 33.2* 38.3* 40.6 43.2  MCV 85.3  --  86.2 86.3 82.9 82.9  -Checking Hemoccult -Checked Anemia Panel and showed an iron level of 66, UIBC 192, TIBC 258, saturation ratios of 26%, ferritin level 177, folate level 15.2 and vitamin B12 level 411 -Denies any Melena or Hematemesis -Could be a spurious result so rechecking -Continue to to Monitor for S/Sx of Bleeding; No overt bleeding noted -Repeat CBC in the AM   Hyperbilirubinemia -Bilirubin Trend: Recent Labs  Lab 05/21/23 1347 05/22/23 0008 05/23/23 0847 05/24/23 0614 05/25/23 0526  BILITOT 2.1* 1.9* 2.6* 2.1* 2.4*  -C/w IVF Hydration; continue IVF Bolus  again -Continue to Monitor and Trend and repeat CMP in the AM  Non-Severe (Moderate) Malnutrition in the Context of Chronic illness, Underweight -Nutrition Status: Nutrition Problem: Moderate Malnutrition Etiology: chronic illness (uncontrolled diabetes) Signs/Symptoms: moderate fat depletion, moderate muscle depletion, energy intake < or equal to 75% for > or equal to 1 month Interventions: Glucerna shake, MVI  Hypoalbuminemia -Patient's Albumin Trend: Recent Labs  Lab 05/21/23 1347 05/22/23 0008 05/23/23 0847 05/24/23 0614 05/25/23 0526  ALBUMIN 3.9 3.4* 3.4* 3.5 3.9  -Continue to Monitor and Trend and repeat CMP in the AM   DVT prophylaxis: enoxaparin (LOVENOX) injection 40 mg Start: 05/22/23 2200 SCDs Start: 05/22/23 1527    Code Status: Full Code Family Communication: No family present at bedside  Disposition Plan:  Level of care: Med-Surg Status is: Inpatient Remains inpatient appropriate because: Continues to be Nauseous and has poor po intake   Consultants:  Discussed with GI yesterday  Procedures:  As delineated as above  Antimicrobials:  Anti-infectives (From admission, onward)    Start     Dose/Rate Route Frequency Ordered Stop   05/22/23 1600  rifaximin (XIFAXAN) tablet 550 mg  550 mg Oral 3 times daily 05/22/23 1527         Subjective: Seen and examined and continues to be nauseous.  Still not really able to tolerate oral intake but willing to try a soft diet.  States his back pain is improved now.  No other concerns or complaints this time.  Appears a little frustrated.  Objective: Vitals:   05/24/23 2004 05/24/23 2152 05/25/23 0527 05/25/23 1516  BP: (!) 158/110 (!) 143/101 (!) 146/102 (!) 144/99  Pulse: 97 86 99 97  Resp: 18  18 17   Temp: 98.4 F (36.9 C)  98.6 F (37 C) 98.8 F (37.1 C)  TempSrc:   Oral   SpO2: 100%  99% 100%  Weight:      Height:        Intake/Output Summary (Last 24 hours) at 05/25/2023 1646 Last data filed at  05/25/2023 1500 Gross per 24 hour  Intake 3597.02 ml  Output 2250 ml  Net 1347.02 ml   Filed Weights   05/22/23 0729  Weight: 65.2 kg   Examination: Physical Exam:  Constitutional: Thin cachectic African-American male who appears little bit more comfortable today Respiratory: Diminished to auscultation bilaterally, no wheezing, rales, rhonchi or crackles. Normal respiratory effort and patient is not tachypenic. No accessory muscle use.  Unlabored breathing Cardiovascular: RRR, no murmurs / rubs / gallops. S1 and S2 auscultated. No extremity edema. Abdomen: Soft, not as tender, non-distended. Bowel sounds positive.  GU: Deferred. Musculoskeletal: No clubbing / cyanosis of digits/nails. No joint deformity upper and lower extremities. Skin: No rashes, lesions, ulcers. No induration; Warm and dry.  Neurologic: CN 2-12 grossly intact with no focal deficits. Romberg sign and cerebellar reflexes not assessed.  Psychiatric: Normal judgment and insight. Alert and oriented x 3. Normal mood and appropriate affect.   Data Reviewed: I have personally reviewed following labs and imaging studies  CBC: Recent Labs  Lab 05/21/23 1347 05/21/23 2145 05/22/23 1310 05/23/23 0847 05/24/23 0614 05/25/23 0526  WBC 8.0  --  6.2 5.9 4.8 5.5  NEUTROABS 6.0  --  3.9 3.9 2.9 3.2  HGB 13.0 13.6 10.5* 12.4* 13.5 14.1  HCT 40.5 40.0 33.2* 38.3* 40.6 43.2  MCV 85.3  --  86.2 86.3 82.9 82.9  PLT 313  --  220 264 283 308   Basic Metabolic Panel: Recent Labs  Lab 05/21/23 1939 05/21/23 2145 05/22/23 1200 05/22/23 1310 05/23/23 0847 05/24/23 0614 05/25/23 0526  NA  --    < > 140 137 131* 133* 129*  K  --    < > 3.2* 3.4* 3.4* 3.6 4.0  CL  --    < > 112* 105 96* 97* 94*  CO2  --    < > 20* 23 24 24 23   GLUCOSE  --    < > 224* 257* 215* 163* 174*  BUN  --    < > 13 14 9 6 9   CREATININE  --    < > 0.48* 0.61 0.60* 0.55* 0.73  CALCIUM  --    < > 5.9* 7.0* 8.1* 8.4* 8.6*  MG 2.1  --   --   --  1.8 2.0  1.9  PHOS  --   --   --   --  2.2* 3.2 3.2   < > = values in this interval not displayed.   GFR: Estimated Creatinine Clearance: 113.2 mL/min (by C-G formula based on SCr of 0.73 mg/dL). Liver Function Tests: Recent Labs  Lab  05/21/23 1347 05/22/23 0008 05/23/23 0847 05/24/23 0614 05/25/23 0526  AST 20 14* 13* 14* 15  ALT 17 18 14 14 16   ALKPHOS 42 40 35* 39 44  BILITOT 2.1* 1.9* 2.6* 2.1* 2.4*  PROT 6.5 5.9* 5.6* 5.9* 6.5  ALBUMIN 3.9 3.4* 3.4* 3.5 3.9   Recent Labs  Lab 05/21/23 1347 05/22/23 0924 05/24/23 1100  LIPASE 25 30 20    No results for input(s): "AMMONIA" in the last 168 hours. Coagulation Profile: No results for input(s): "INR", "PROTIME" in the last 168 hours. Cardiac Enzymes: No results for input(s): "CKTOTAL", "CKMB", "CKMBINDEX", "TROPONINI" in the last 168 hours. BNP (last 3 results) No results for input(s): "PROBNP" in the last 8760 hours. HbA1C: No results for input(s): "HGBA1C" in the last 72 hours. CBG: Recent Labs  Lab 05/24/23 1813 05/24/23 2053 05/25/23 0745 05/25/23 1131 05/25/23 1625  GLUCAP 156* 104* 171* 162* 99   Lipid Profile: No results for input(s): "CHOL", "HDL", "LDLCALC", "TRIG", "CHOLHDL", "LDLDIRECT" in the last 72 hours. Thyroid Function Tests: No results for input(s): "TSH", "T4TOTAL", "FREET4", "T3FREE", "THYROIDAB" in the last 72 hours. Anemia Panel: Recent Labs    05/25/23 0526  VITAMINB12 411  FOLATE 15.2  FERRITIN 177  TIBC 258  IRON 66  RETICCTPCT 1.0   Sepsis Labs: Recent Labs  Lab 05/22/23 0934  LATICACIDVEN 1.4   Recent Results (from the past 240 hour(s))  Resp panel by RT-PCR (RSV, Flu A&B, Covid) Anterior Nasal Swab     Status: None   Collection Time: 05/21/23  1:48 PM   Specimen: Anterior Nasal Swab  Result Value Ref Range Status   SARS Coronavirus 2 by RT PCR NEGATIVE NEGATIVE Final   Influenza A by PCR NEGATIVE NEGATIVE Final   Influenza B by PCR NEGATIVE NEGATIVE Final    Comment:  (NOTE) The Xpert Xpress SARS-CoV-2/FLU/RSV plus assay is intended as an aid in the diagnosis of influenza from Nasopharyngeal swab specimens and should not be used as a sole basis for treatment. Nasal washings and aspirates are unacceptable for Xpert Xpress SARS-CoV-2/FLU/RSV testing.  Fact Sheet for Patients: BloggerCourse.com  Fact Sheet for Healthcare Providers: SeriousBroker.it  This test is not yet approved or cleared by the [place] FDA and has been authorized for detection and/or diagnosis of SARS-CoV-2 by FDA under an Emergency Use Authorization (EUA). This EUA will remain in effect (meaning this test can be used) for the duration of the COVID-19 declaration under Section 564(b)(1) of the Act, 21 U.S.C. section 360bbb-3(b)(1), unless the authorization is terminated or revoked.     Resp Syncytial Virus by PCR NEGATIVE NEGATIVE Final    Comment: (NOTE) Fact Sheet for Patients: BloggerCourse.com  Fact Sheet for Healthcare Providers: SeriousBroker.it  This test is not yet approved or cleared by the [place] FDA and has been authorized for detection and/or diagnosis of SARS-CoV-2 by FDA under an Emergency Use Authorization (EUA). This EUA will remain in effect (meaning this test can be used) for the duration of the COVID-19 declaration under Section 564(b)(1) of the Act, 21 U.S.C. section 360bbb-3(b)(1), unless the authorization is terminated or revoked.  Performed at [facility], [address] [place], [place] [address]    Radiology Studies: DG Lumbar Spine 2-3 Views  Result Date: 05/24/2023 CLINICAL DATA:  Back pain EXAM: THORACIC SPINE 2 VIEWS LUMBAR SPINE - 2 VIEW COMPARISON:  None Available. FINDINGS: Five lumbar-type vertebral bodies. Vertebral body heights are maintained. Disc spaces are preserved. Alignment is normal. No other  significant bone  abnormalities are identified. IMPRESSION: No acute osseous abnormality Electronically Signed   By: Lorenza Cambridge M.D.   On: 05/24/2023 13:47   DG Thoracic Spine 2 View  Result Date: 05/24/2023 CLINICAL DATA:  Back pain EXAM: THORACIC SPINE 2 VIEWS LUMBAR SPINE - 2 VIEW COMPARISON:  None Available. FINDINGS: Five lumbar-type vertebral bodies. Vertebral body heights are maintained. Disc spaces are preserved. Alignment is normal. No other significant bone abnormalities are identified. IMPRESSION: No acute osseous abnormality Electronically Signed   By: Lorenza Cambridge M.D.   On: 05/24/2023 13:47    Scheduled Meds:  enoxaparin (LOVENOX) injection  40 mg Subcutaneous Q24H   escitalopram  20 mg Oral Daily   feeding supplement (GLUCERNA SHAKE)  237 mL Oral TID BM   insulin aspart  0-15 Units Subcutaneous TID WC   insulin aspart  0-5 Units Subcutaneous QHS   insulin glargine-yfgn  20 Units Subcutaneous Daily   metoCLOPramide (REGLAN) injection  10 mg Intravenous Q8H   multivitamin with minerals  1 tablet Oral Daily   pantoprazole (PROTONIX) IV  40 mg Intravenous Daily   rifaximin  550 mg Oral TID   Continuous Infusions:  sodium chloride 10 mL/hr (05/23/23 2013)   0.9 % NaCl with KCl 20 mEq / L 100 mL/hr at 05/25/23 1042    LOS: 1 day   Marguerita Merles, DO [facility] Available via Epic secure chat 7am-7pm After these hours, please refer to coverage provider listed on [email or web address] 05/25/2023, 4:46 PM

## 2023-05-25 NOTE — Progress Notes (Addendum)
Initial Nutrition Assessment  DOCUMENTATION CODES:   Non-severe (moderate) malnutrition in context of chronic illness, Underweight  INTERVENTION:   -Glucerna Shake po TID, each supplement provides 220 kcal and 10 grams of protein   -Multivitamin with minerals daily  -Patient not ordering meals so will add meal order with assist  NUTRITION DIAGNOSIS:   Moderate Malnutrition related to chronic illness (uncontrolled diabetes) as evidenced by moderate fat depletion, moderate muscle depletion, energy intake < or equal to 75% for > or equal to 1 month.  GOAL:   Patient will meet greater than or equal to 90% of their needs  MONITOR:   PO intake, Supplement acceptance, Weight trends, Labs, I & O's  REASON FOR ASSESSMENT:   Consult Assessment of nutrition requirement/status, Diet education, Poor PO  ASSESSMENT:   42 y.o. male with medical history significant for type 1 diabetes, small intestinal bacterial overgrowth, marijuana use intermittent chronic nausea and vomiting being admitted to the hospital with recurrent abdominal discomfort, intractable nausea and vomiting for the last 3 to 4 days.  Patient in room, laying down in bed. Reports he is not getting good sleep. Reports he eats at home but only 2 meals a day. Breakfast is sometimes oatmeal and dinner would be something like chicken and rice. Doesn't consume fruits or vegetables. Takes a daily Centrum MVI. Pt with continued nausea. Per MD note, likely r/t THC use.  Encouraged pt to try and consume consistent meals. Would like Glucerna shakes so will order these for additional kcals and protein.  Patient with no diet related questions for RD. Pt has been educated on multiple previous nutrition encounters. Was followed outpatient for a while.   Per weight records, pt's weight the same since 6/13. Pt states his weigh tends to fluctuate between 130-150 lbs.  Medications: Reglan, Zofran, Compazine  Labs reviewed: CBGs:  104-171 Low Na   NUTRITION - FOCUSED PHYSICAL EXAM:  Flowsheet Row Most Recent Value  Orbital Region Mild depletion  Upper Arm Region Moderate depletion  Thoracic and Lumbar Region Moderate depletion  Buccal Region Mild depletion  Temple Region Mild depletion  Clavicle Bone Region Moderate depletion  Clavicle and Acromion Bone Region Moderate depletion  Scapular Bone Region Mild depletion  Dorsal Hand Mild depletion  Patellar Region Mild depletion  Anterior Thigh Region Mild depletion  Posterior Calf Region Mild depletion  Edema (RD Assessment) None  Hair Reviewed  Eyes Reviewed  Mouth Reviewed  Skin Reviewed       Diet Order:   Diet Order             DIET SOFT Room service appropriate? Yes; Fluid consistency: Thin  Diet effective now                   EDUCATION NEEDS:   No education needs have been identified at this time  Skin:  Skin Assessment: Reviewed RN Assessment  Last BM:  8/7  Height:   Ht Readings from Last 1 Encounters:  05/22/23 6\' 3"  (1.905 m)    Weight:   Wt Readings from Last 1 Encounters:  05/22/23 65.2 kg    BMI:  Body mass index is 17.97 kg/m.  Estimated Nutritional Needs:   Kcal:  2200-2400  Protein:  100-115g  Fluid:  2.2L/day  Tilda Franco, MS, RD, LDN Inpatient Clinical Dietitian Contact information available via Amion

## 2023-05-25 NOTE — Transitions of Care (Post Inpatient/ED Visit) (Unsigned)
   05/25/2023  Name: Edward Walls MRN: [id number] DOB: 09-26-1981  Today's TOC FU Call Status: Today's TOC FU Call Status:: Unsuccessful Call (1st Attempt) Unsuccessful Call (1st Attempt) Date: 05/25/23  Attempted to reach the patient regarding the most recent Inpatient/ED visit.  Follow Up Plan: Additional outreach attempts will be made to reach the patient to complete the Transitions of Care (Post Inpatient/ED visit) call.   Pt was Dc'd from ED, but admitted to the [facility] and currently still admitted.  Signature Arvil Persons, BSN, Charity fundraiser

## 2023-05-26 DIAGNOSIS — R112 Nausea with vomiting, unspecified: Secondary | ICD-10-CM | POA: Diagnosis not present

## 2023-05-26 DIAGNOSIS — E440 Moderate protein-calorie malnutrition: Secondary | ICD-10-CM | POA: Diagnosis not present

## 2023-05-26 LAB — GLUCOSE, CAPILLARY
Glucose-Capillary: 118 mg/dL — ABNORMAL HIGH (ref 70–99)
Glucose-Capillary: 150 mg/dL — ABNORMAL HIGH (ref 70–99)

## 2023-05-26 LAB — BETA-HYDROXYBUTYRIC ACID: Beta-Hydroxybutyric Acid: 1.16 mmol/L — ABNORMAL HIGH (ref 0.05–0.27)

## 2023-05-26 MED ORDER — ACETAMINOPHEN 325 MG PO TABS: 650.0000 mg | ORAL_TABLET | Freq: Four times a day (QID) | ORAL | 0 refills | Status: DC | PRN

## 2023-05-26 MED ORDER — GLUCERNA SHAKE PO LIQD: 237.0000 mL | Freq: Three times a day (TID) | ORAL | 0 refills | Status: AC

## 2023-05-26 MED ORDER — ADULT MULTIVITAMIN W/MINERALS CH: 1.0000 | ORAL_TABLET | Freq: Every day | ORAL | 0 refills | Status: DC

## 2023-05-26 MED ORDER — HYDRALAZINE HCL 20 MG/ML IJ SOLN
5.0000 mg | Freq: Once | INTRAMUSCULAR | Status: AC
  Administered 2023-05-26: 5 mg via INTRAVENOUS
  Filled 2023-05-26: qty 1

## 2023-05-26 MED ORDER — METOCLOPRAMIDE HCL 10 MG PO TABS: ORAL_TABLET | ORAL | 0 refills | Status: DC

## 2023-05-26 NOTE — Discharge Summary (Signed)
Physician Discharge Summary   Patient: Edward Walls MRN: [id number] DOB: 1980-11-16  Admit date:     05/22/2023  Discharge date: 05/26/2023  Discharge Physician: Marguerita Merles, DO   PCP: Mliss Sax, MD   Recommendations at discharge:   Follow up with PCP within 1-2 weeks and repeat CBC, CMP, Mag, Phos within 1 week Follow up with Gastroenterology within 1-2 weeks   Discharge Diagnoses: Principal Problem:   Intractable vomiting with nausea Active Problems:   Malnutrition of moderate degree  Resolved Problems:   * No resolved hospital problems. *  Hospital Course: HPI: Edward Walls is a 42 y.o. male with medical history significant for type 1 diabetes, small intestinal bacterial overgrowth, marijuana use intermittent chronic nausea and vomiting being admitted to the hospital with recurrent abdominal discomfort, intractable nausea and vomiting for the last 3 to 4 days.  He has had multiple hospital admissions for intractable nausea vomiting, as well as DKA.  He presented to the emergency department on 8/5 with intractable nausea vomiting abdominal pain.  Was found to not be in DKA, was given IV fluids, supportive care felt better and discharged home.  However he came back this morning to [facility], ER with continued complaints of chest pain, intractable vomiting.  He denies any fevers or chills, cough, chest pain, shortness of breath.  No hematemesis or coffee-ground's.  Tells me that his pain is similar to prior bouts of intractable vomiting.  Last bowel movement was about 5 days ago.   ED Course: In the emergency department, vital signs are unremarkable.  CBC unremarkable except for hemoglobin of 10.5, which is a new finding for him.  Blood sugar this morning 310, otherwise BMP relatively unremarkable.  He was given a dose of IV calcium gluconate due to mild hypocalcemia.   **Interim History Patient continues to be nauseous and vomiting and so added Compazine.  Diabetes  education coordinator consulted and adjusting medications as necessary. Continues to be nauseous and now having back pain. Will continue scheduled Antiemetics and if not improving will obtain GI Consultation in the AM but patient willing to try advanced Diet today. Nursing states his appetite is very poor so have consulted Nutrition.   Assessment and Plan:  Intractable Nausea and Vomiting -No evidence of acute process, especially considering CT scan with no acute findings.   -Likely cannabinoid hyperemesis given Positive TCH, versus gastroparesis. -C/w Observation admission -Supportive care with IV fluids, pain and nausea medications as needed -I have added Compazine in addition to his Zofran given his nausea and vomiting this morning -Clear liquid diet and advance as tolerated and advanced to Full if Tolerated; Unable to tolerate Soft Diet yesterday so cut back to FULL but willing to try Soft Diet today  -Started On IV Metaclopramide 10 mg q8h for now and will continue -Give an additional 1.5 Liter bolus today  -Lipase Level Trend: Recent Labs  Lab 05/21/23 1347 05/22/23 0924 05/24/23 1100  LIPASE 25 30 20   -Advance diet as tolerated if tolerating p.o.'s without issues likely can be discharged but he was not able to tolerate Soft Diet yet -If not improving after 48 hours will get GI formally consulted    Uncontrolled Type 1 Diabetes Mellitus - Hyperglycemia without Metabolic Acidosis but has Ketosis  -Carb controlled diet as it is advanced -Beta-Hydroxybutyric Acid Trend: 4.35 -> 1.66 -> 3.11 -> 2.26 -> 3.15 -Resume his home basal insulin at reduced dose and is now on 20 units but will go  to 25 units of Semglee and also on Moderate dose sliding scale insulin AC/HS -Will aslo add 4 units of Novolog TID with Meals if eating the Carb Modified Diet -CBG Trend: Recent Labs  Lab 05/24/23 0739 05/24/23 1156 05/24/23 1813 05/24/23 2053 05/25/23 0745 05/25/23 1131 05/25/23 1625  GLUCAP  148* 130* 156* 104* 171* 162* 99  Glucose Trend: Recent Labs  Lab 05/21/23 1347 05/22/23 0008 05/22/23 1200 05/22/23 1310 05/23/23 0847 05/24/23 0614 05/25/23 0526  GLUCOSE 263* 297* 224* 257* 215* 163* 174*   Hyponatremia -Mild. Na+ Trend: Recent Labs  Lab 05/21/23 2145 05/22/23 0008 05/22/23 1200 05/22/23 1310 05/23/23 0847 05/24/23 0614 05/25/23 0526  NA 134* 135 140 137 131* 133* 129*  -C/w IVF with NS + 20 mEQ of KCL at 100 mL/hr but reduce the rate to 75 mL/hr -Repeat CMP in the AM    Hypocalcemia -Sstatus post IV replacement in the ER -Calcium Level Trend: Recent Labs  Lab 05/21/23 1347 05/22/23 0008 05/22/23 1200 05/22/23 1310 05/23/23 0847 05/24/23 0614 05/25/23 0526  CALCIUM 9.1 8.7* 5.9* 7.0* 8.1* 8.4* 8.6*  -Continue to Monitor and Trend and repeat CMP in the AM   Hypokalemia -Patient's K+ Level Trend: Recent Labs  Lab 05/21/23 2145 05/22/23 0008 05/22/23 1200 05/22/23 1310 05/23/23 0847 05/24/23 0614 05/25/23 0526  K 3.8 3.9 3.2* 3.4* 3.4* 3.6 4.0  -Replete with IV K Phos 20 mmol yesterday; Reduce NS with 20 mEQ KCL at 100 mL/hr to 75 mL/hr -Continue to Monitor and Replete as Necessary -Repeat CMP in the AM   Back Pain, improved -Obtain Imaging with T Spine and L Spine X-Ray and showed No acute osseous abnormalities  -K Pad ordered  Hypophosphatemia -Phos Level Trend: Recent Labs  Lab 05/23/23 0847 05/24/23 0614 05/25/23 0526  PHOS 2.2* 3.2 3.2  -Replete with IV K Phos 20 mmol yesterday -Continue to Monitor and Replete as Necessary -Repeat Phos Level in the AM   Normocytic Anemia  -Hgb/Hct Trend: Recent Labs  Lab 05/21/23 1347 05/21/23 2145 05/22/23 1310 05/23/23 0847 05/24/23 0614 05/25/23 0526  HGB 13.0 13.6 10.5* 12.4* 13.5 14.1  HCT 40.5 40.0 33.2* 38.3* 40.6 43.2  MCV 85.3  --  86.2 86.3 82.9 82.9  -Checking Hemoccult -Checked Anemia Panel and showed an iron level of 66, UIBC 192, TIBC 258, saturation ratios of  26%, ferritin level 177, folate level 15.2 and vitamin B12 level 411 -Denies any Melena or Hematemesis -Could be a spurious result so rechecking -Continue to to Monitor for S/Sx of Bleeding; No overt bleeding noted -Repeat CBC in the AM   Hyperbilirubinemia -Bilirubin Trend: Recent Labs  Lab 05/21/23 1347 05/22/23 0008 05/23/23 0847 05/24/23 0614 05/25/23 0526  BILITOT 2.1* 1.9* 2.6* 2.1* 2.4*  -C/w IVF Hydration; continue IVF Bolus again -Continue to Monitor and Trend and repeat CMP in the AM  Non-Severe (Moderate) Malnutrition in the Context of Chronic illness, Underweight -Nutrition Status: Nutrition Problem: Moderate Malnutrition Etiology: chronic illness (uncontrolled diabetes) Signs/Symptoms: moderate fat depletion, moderate muscle depletion, energy intake < or equal to 75% for > or equal to 1 month Interventions: Glucerna shake, MVI  Hypoalbuminemia -Patient's Albumin Trend: Recent Labs  Lab 05/21/23 1347 05/22/23 0008 05/23/23 0847 05/24/23 0614 05/25/23 0526  ALBUMIN 3.9 3.4* 3.4* 3.5 3.9  -Continue to Monitor and Trend and repeat CMP in the AM   Assessment and Plan: No notes have been filed under this hospital service. Service: Hospitalist     {Tip this will not be  part of the note when signed Body mass index is 17.97 kg/m. ,  Nutrition Documentation    Flowsheet Row ED to Hosp-Admission (Discharged) from 05/22/2023 in [facility]  Nutrition Problem Moderate Malnutrition  Etiology chronic illness  [uncontrolled diabetes]  Nutrition Goal Patient will meet greater than or equal to 90% of their needs  Interventions Glucerna shake, MVI     ,  (Optional):26781}  {(NOTE) Pain control PDMP Statment (Optional):26782} Consultants: *** Procedures performed: ***  Disposition: {Plan; Disposition:26390} Diet recommendation:  Discharge Diet Orders (From admission, onward)     Start     Ordered   05/26/23 0000  Diet -  low sodium heart healthy        05/26/23 1334   05/26/23 0000  Diet Carb Modified        05/26/23 1334           {Diet_Plan:26776} DISCHARGE MEDICATION: Allergies as of 05/26/2023       Reactions   Other Other (See Comments)   All nuts Throat pain   Peanut-containing Drug Products Other (See Comments)   Throat pain        Medication List     STOP taking these medications    Omnipod 5 G6 Intro (Gen 5) Kit   ondansetron 4 MG disintegrating tablet Commonly known as: ZOFRAN-ODT       TAKE these medications    acetaminophen 325 MG tablet Commonly known as: TYLENOL Take 2 tablets (650 mg total) by mouth every 6 (six) hours as needed for mild pain (or Fever >/= 101).   Dexcom G6 Sensor Misc Replace every 10 days   Dexcom G6 Transmitter Misc 1 Device by Does not apply route every 3 (three) months.   escitalopram 20 MG tablet Commonly known as: LEXAPRO Take 20 mg by mouth daily.   feeding supplement (GLUCERNA SHAKE) Liqd Take 237 mLs by mouth 3 (three) times daily between meals.   gabapentin 300 MG capsule Commonly known as: NEURONTIN Take 2 capsules (600 mg total) by mouth 3 (three) times daily.   insulin aspart 100 UNIT/ML FlexPen Commonly known as: NOVOLOG Inject 10 Units into the skin 3 (three) times daily with meals.   insulin glargine 100 UNIT/ML Solostar Pen Commonly known as: LANTUS Inject 30 Units into the skin daily.   Insulin Pen Needle 32G X 4 MM Misc Use 4-5x a day   lidocaine 5 % Commonly known as: LIDODERM Place 1 patch onto the skin daily. Remove & Discard patch within 12 hours or as directed by MD   metoCLOPramide 10 MG tablet Commonly known as: REGLAN Take 1 tablet (10 mg total) by mouth 3 (three) times daily with meals for 7 days, THEN 1 tablet (10 mg total) every 8 (eight) hours as needed for up to 7 days for nausea. Start taking on: May 26, 2023 What changed: See the new instructions.   multivitamin with minerals Tabs  tablet Take 1 tablet by mouth daily. Start taking on: May 27, 2023   ondansetron 4 MG tablet Commonly known as: ZOFRAN Take 1 tablet (4 mg total) by mouth every 6 (six) hours as needed for nausea or vomiting.   pantoprazole 40 MG tablet Commonly known as: PROTONIX Take 40 mg by mouth 2 (two) times daily.   Xifaxan 550 MG Tabs tablet Generic drug: rifaximin Take 550 mg by mouth 3 (three) times daily.        Discharge Exam: Ceasar Mons Weights   05/22/23  4010  Weight: 65.2 kg   ***  Condition at discharge: {DC Condition:26389}  The results of significant diagnostics from this hospitalization (including imaging, microbiology, ancillary and laboratory) are listed below for reference.   Imaging Studies: DG Lumbar Spine 2-3 Views  Result Date: 05/24/2023 CLINICAL DATA:  Back pain EXAM: THORACIC SPINE 2 VIEWS LUMBAR SPINE - 2 VIEW COMPARISON:  None Available. FINDINGS: Five lumbar-type vertebral bodies. Vertebral body heights are maintained. Disc spaces are preserved. Alignment is normal. No other significant bone abnormalities are identified. IMPRESSION: No acute osseous abnormality Electronically Signed   By: Lorenza Cambridge M.D.   On: 05/24/2023 13:47   DG Thoracic Spine 2 View  Result Date: 05/24/2023 CLINICAL DATA:  Back pain EXAM: THORACIC SPINE 2 VIEWS LUMBAR SPINE - 2 VIEW COMPARISON:  None Available. FINDINGS: Five lumbar-type vertebral bodies. Vertebral body heights are maintained. Disc spaces are preserved. Alignment is normal. No other significant bone abnormalities are identified. IMPRESSION: No acute osseous abnormality Electronically Signed   By: Lorenza Cambridge M.D.   On: 05/24/2023 13:47   CT ABDOMEN PELVIS W CONTRAST  Result Date: 05/22/2023 CLINICAL DATA:  Three day history of nausea and vomiting in the setting of hyperglycemia EXAM: CT ABDOMEN AND PELVIS WITH CONTRAST TECHNIQUE: Multidetector CT imaging of the abdomen and pelvis was performed using the standard protocol  following bolus administration of intravenous contrast. RADIATION DOSE REDUCTION: This exam was performed according to the departmental dose-optimization program which includes automated exposure control, adjustment of the mA and/or kV according to patient size and/or use of iterative reconstruction technique. CONTRAST:  OMNIPAQUE IOHEXOL 300 MG/ML  SOLN COMPARISON:  CT abdomen and pelvis dated 10/25/2022 FINDINGS: Lower chest: No focal consolidation or pulmonary nodule in the lung bases. No pleural effusion or pneumothorax demonstrated. Partially imaged heart size is normal. Hepatobiliary: No focal hepatic lesions. No intra or extrahepatic biliary ductal dilation. Normal gallbladder. Pancreas: No focal lesions or main ductal dilation. Spleen: Normal in size without focal abnormality. Adrenals/Urinary Tract: No adrenal nodules. No suspicious renal mass, calculi or hydronephrosis. No focal bladder wall thickening. Stomach/Bowel: Normal appearance of the stomach. No evidence of bowel wall thickening, distention, or inflammatory changes. Appendix is not discretely seen. Vascular/Lymphatic: No significant vascular findings are present. No enlarged abdominal or pelvic lymph nodes. Reproductive: Prostate is unremarkable. Other: No free fluid, fluid collection, or free air. Musculoskeletal: No acute or abnormal lytic or blastic osseous lesions. IMPRESSION: No acute abdominopelvic findings. Electronically Signed   By: Agustin Cree M.D.   On: 05/22/2023 09:15   DG Chest 1 View  Result Date: 05/21/2023 CLINICAL DATA:  Chest pain, shortness of breath.  Flutter. EXAM: CHEST  1 VIEW COMPARISON:  March 26, 2023. FINDINGS: The heart size and mediastinal contours are within normal limits. Both lungs are clear. The visualized skeletal structures are unremarkable. IMPRESSION: No active disease. Electronically Signed   By: Lupita Raider M.D.   On: 05/21/2023 15:40    Microbiology: Results for orders placed or performed  during the hospital encounter of 05/21/23  Resp panel by RT-PCR (RSV, Flu A&B, Covid) Anterior Nasal Swab     Status: None   Collection Time: 05/21/23  1:48 PM   Specimen: Anterior Nasal Swab  Result Value Ref Range Status   SARS Coronavirus 2 by RT PCR NEGATIVE NEGATIVE Final   Influenza A by PCR NEGATIVE NEGATIVE Final   Influenza B by PCR NEGATIVE NEGATIVE Final    Comment: (NOTE) The Xpert Xpress SARS-CoV-2/FLU/RSV plus  assay is intended as an aid in the diagnosis of influenza from Nasopharyngeal swab specimens and should not be used as a sole basis for treatment. Nasal washings and aspirates are unacceptable for Xpert Xpress SARS-CoV-2/FLU/RSV testing.  Fact Sheet for Patients: BloggerCourse.com  Fact Sheet for Healthcare Providers: SeriousBroker.it  This test is not yet approved or cleared by the [place] FDA and has been authorized for detection and/or diagnosis of SARS-CoV-2 by FDA under an Emergency Use Authorization (EUA). This EUA will remain in effect (meaning this test can be used) for the duration of the COVID-19 declaration under Section 564(b)(1) of the Act, 21 U.S.C. section 360bbb-3(b)(1), unless the authorization is terminated or revoked.     Resp Syncytial Virus by PCR NEGATIVE NEGATIVE Final    Comment: (NOTE) Fact Sheet for Patients: BloggerCourse.com  Fact Sheet for Healthcare Providers: SeriousBroker.it  This test is not yet approved or cleared by the [place] FDA and has been authorized for detection and/or diagnosis of SARS-CoV-2 by FDA under an Emergency Use Authorization (EUA). This EUA will remain in effect (meaning this test can be used) for the duration of the COVID-19 declaration under Section 564(b)(1) of the Act, 21 U.S.C. section 360bbb-3(b)(1), unless the authorization is terminated or revoked.  Performed at [facility]  Lab, [address] [place], [place] [address]     Labs: CBC: Recent Labs  Lab 05/22/23 1310 05/23/23 0847 05/24/23 0614 05/25/23 0526 05/26/23 0544  WBC 6.2 5.9 4.8 5.5 5.3  NEUTROABS 3.9 3.9 2.9 3.2 2.3  HGB 10.5* 12.4* 13.5 14.1 13.8  HCT 33.2* 38.3* 40.6 43.2 42.3  MCV 86.2 86.3 82.9 82.9 83.8  PLT 220 264 283 308 289   Basic Metabolic Panel: Recent Labs  Lab 05/21/23 1939 05/21/23 2145 05/22/23 1310 05/23/23 0847 05/24/23 0614 05/25/23 0526 05/26/23 0544  NA  --    < > 137 131* 133* 129* 132*  K  --    < > 3.4* 3.4* 3.6 4.0 3.7  CL  --    < > 105 96* 97* 94* 99  CO2  --    < > 23 24 24 23 22   GLUCOSE  --    < > 257* 215* 163* 174* 128*  BUN  --    < > 14 9 6 9 8   CREATININE  --    < > 0.61 0.60* 0.55* 0.73 0.60*  CALCIUM  --    < > 7.0* 8.1* 8.4* 8.6* 8.6*  MG 2.1  --   --  1.8 2.0 1.9 1.9  PHOS  --   --   --  2.2* 3.2 3.2 3.6   < > = values in this interval not displayed.   Liver Function Tests: Recent Labs  Lab 05/22/23 0008 05/23/23 0847 05/24/23 0614 05/25/23 0526 05/26/23 0544  AST 14* 13* 14* 15 14*  ALT 18 14 14 16 16   ALKPHOS 40 35* 39 44 41  BILITOT 1.9* 2.6* 2.1* 2.4* 1.9*  PROT 5.9* 5.6* 5.9* 6.5 6.0*  ALBUMIN 3.4* 3.4* 3.5 3.9 3.6   CBG: Recent Labs  Lab 05/25/23 1131 05/25/23 1625 05/25/23 2209 05/26/23 0744 05/26/23 1203  GLUCAP 162* 99 115* 118* 150*    Discharge time spent: {LESS THAN/GREATER THAN:26388} 30 minutes.  Signed: Merlene Laughter, DO [facility] 05/26/2023

## 2023-05-26 NOTE — Progress Notes (Signed)
Patient's diastolic BP is running high at 115.  Seems to be that way several times. now.  PB 158/115 pulse 87.

## 2023-05-27 ENCOUNTER — Other Ambulatory Visit: Payer: Self-pay | Admitting: Family Medicine

## 2023-05-28 ENCOUNTER — Encounter: Payer: Self-pay | Admitting: Family Medicine

## 2023-05-28 NOTE — Transitions of Care (Post Inpatient/ED Visit) (Signed)
05/28/2023  Name: Edward Walls MRN: [id number] DOB: [birth year]  Today's TOC FU Call Status: Today's TOC FU Call Status:: Successful TOC FU Call Completed Unsuccessful Call (1st Attempt) Date: 05/25/23 [facility] FU Call Complete Date: 05/28/23  Transition Care Management Follow-up Telephone Call Date of Discharge: 05/26/23 Discharge Facility: [facility]) Type of Discharge: Inpatient Admission Primary Inpatient Discharge Diagnosis:: n/v How have you been since you were released from the hospital?: Better  Items Reviewed: Did you receive and understand the discharge instructions provided?: Yes Any new allergies since your discharge?: No Dietary orders reviewed?: No Do you have support at home?: Yes  Medications Reviewed Today: Medications Reviewed Today     Reviewed by Larey Dresser, RN (Registered Nurse) on 05/28/23 at 1559  Med List Status: <None>   Medication Order Taking? Sig Documenting Provider Last Dose Status Informant  acetaminophen (TYLENOL) 325 MG tablet [id number]  Take 2 tablets (650 mg total) by mouth every 6 (six) hours as needed for mild pain (or Fever >/= 101). [address], Omair Osprey, DO  Active   Continuous Glucose Sensor (DEXCOM G6 SENSOR) MISC [id number] No Replace every 10 days Carlus Pavlov, MD Taking Active Self, Pharmacy Records  Continuous Glucose Transmitter (DEXCOM G6 TRANSMITTER) MISC [id number] No 1 Device by Does not apply route every 3 (three) months. Carlus Pavlov, MD Taking Active Self, Pharmacy Records  escitalopram (LEXAPRO) 20 MG tablet [id number]  TAKE 1 TABLET (20 MG TOTAL) BY MOUTH DAILY. Mliss Sax, MD  Active   feeding supplement, GLUCERNA SHAKE, (GLUCERNA SHAKE) LIQD [id number]  Take 237 mLs by mouth 3 (three) times daily between meals. Sheikh, Omair Sylva, DO  Active   gabapentin (NEURONTIN) 300 MG capsule [id number] No Take 2 capsules (600 mg total) by mouth 3 (three) times daily. Levert Feinstein, MD 05/18/2023 Active Self,  Pharmacy Records  insulin aspart (NOVOLOG) 100 UNIT/ML FlexPen [id number] No Inject 10 Units into the skin 3 (three) times daily with meals. [name], [name], DO 05/18/2023 Expired 05/22/23 2359 Self, Pharmacy Records  insulin glargine (LANTUS) 100 UNIT/ML Solostar Pen [id number] No Inject 30 Units into the skin daily. [name], [name], DO 05/18/2023 Expired 05/22/23 2359 Self, Pharmacy Records  Insulin Pen Needle 32G X 4 MM MISC [id number] No Use 4-5x a day Marguerita Merles [place], DO Taking Active Self, Pharmacy Records  lidocaine (LIDODERM) 5 % [id number] No Place 1 patch onto the skin daily. Remove & Discard patch within 12 hours or as directed by MD [name], [name], DO 05/21/2023 Active Self, Pharmacy Records  metoCLOPramide (REGLAN) 10 MG tablet [id number]  Take 1 tablet (10 mg total) by mouth 3 (three) times daily with meals for 7 days, THEN 1 tablet (10 mg total) every 8 (eight) hours as needed for up to 7 days for nausea. Sheikh, Omair [place], DO  Active   Multiple Vitamin (MULTIVITAMIN WITH MINERALS) TABS tablet [id number]  Take 1 tablet by mouth daily. Sheikh, Omair Latif, DO  Active   ondansetron (ZOFRAN) 4 MG tablet [id number] No Take 1 tablet (4 mg total) by mouth every 6 (six) hours as needed for nausea or vomiting. [name], MD 05/22/2023 Active Self, Pharmacy Records  pantoprazole (PROTONIX) 40 MG tablet [id number] No Take 40 mg by mouth 2 (two) times daily. [provider] 05/18/2023 Active Self, Pharmacy Records  XIFAXAN 550 MG TABS tablet [id number] No Take 550 mg by mouth 3 (three) times daily. [provider] 05/18/2023 Active Self, Pharmacy Records  Home Care and Equipment/Supplies: Were Home Health Services Ordered?: NA Any new equipment or medical supplies ordered?: NA  Functional Questionnaire: Do you need assistance with bathing/showering or dressing?: No Do you need assistance with meal preparation?: Yes Do you need assistance with eating?: No Do you have  difficulty maintaining continence: No Do you need assistance with getting out of bed/getting out of a chair/moving?: No Do you have difficulty managing or taking your medications?: No  Follow up appointments reviewed:      SIGNATURE Arvil Persons, BSN, RN

## 2023-05-29 ENCOUNTER — Encounter: Payer: Self-pay | Admitting: Family Medicine

## 2023-05-29 ENCOUNTER — Ambulatory Visit: Payer: Managed Care, Other (non HMO) | Admitting: Family Medicine

## 2023-05-29 VITALS — BP 120/84 | HR 110 | Temp 98.0°F | Ht 75.0 in | Wt 140.4 lb

## 2023-05-29 DIAGNOSIS — R000 Tachycardia, unspecified: Secondary | ICD-10-CM

## 2023-05-29 DIAGNOSIS — R112 Nausea with vomiting, unspecified: Secondary | ICD-10-CM | POA: Diagnosis not present

## 2023-05-29 DIAGNOSIS — F418 Other specified anxiety disorders: Secondary | ICD-10-CM | POA: Diagnosis not present

## 2023-05-29 DIAGNOSIS — E1043 Type 1 diabetes mellitus with diabetic autonomic (poly)neuropathy: Secondary | ICD-10-CM

## 2023-05-29 DIAGNOSIS — K3184 Gastroparesis: Secondary | ICD-10-CM

## 2023-05-29 NOTE — Progress Notes (Signed)
Established Patient Office Visit   Subjective:  Patient ID: Edward Walls, male    DOB: Dec 15, 1980  Age: 42 y.o. MRN: [id number]  Chief Complaint  Patient presents with   Hospitalization Follow-up    Hospitla follow up. For Nausea, fatigue and heart flutters. Pt needs a return to work full duty note for dates 8/6-8/15.     HPI Encounter Diagnoses  Name Primary?   Depression with anxiety Yes   Nausea and vomiting, unspecified vomiting type    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)    Tachycardia    Follow-up for recent hospitalization for irretractable nausea and vomiting and DKA.  Magnesium and phosphorus were found to be low.  Treated for DKA and received fluid resuscitation.  He has been smoking marijuana.  Heart rate has been up and down.  There has been no shortness of breath or chest pain.  There is been no diaphoresis.  Glucose per his continuous monitor is 176.  Lexapro seems to be helping with anxiety and depression.  He reports compliance with his insulin regimen.   Review of Systems  Constitutional: Negative.  Negative for diaphoresis.  HENT: Negative.    Eyes:  Negative for blurred vision, discharge and redness.  Respiratory: Negative.  Negative for shortness of breath.   Cardiovascular:  Positive for palpitations. Negative for chest pain.  Gastrointestinal:  Negative for abdominal pain, nausea and vomiting.  Genitourinary: Negative.   Musculoskeletal: Negative.  Negative for myalgias.  Skin:  Negative for rash.  Neurological:  Negative for tingling, loss of consciousness and weakness.  Endo/Heme/Allergies:  Negative for polydipsia.     Current Outpatient Medications:    acetaminophen (TYLENOL) 325 MG tablet, Take 2 tablets (650 mg total) by mouth every 6 (six) hours as needed for mild pain (or Fever >/= 101)., Disp: 20 tablet, Rfl: 0   Continuous Glucose Sensor (DEXCOM G6 SENSOR) MISC, Replace every 10 days, Disp: 9 each, Rfl: 3    Continuous Glucose Transmitter (DEXCOM G6 TRANSMITTER) MISC, 1 Device by Does not apply route every 3 (three) months., Disp: 1 each, Rfl: 3   escitalopram (LEXAPRO) 20 MG tablet, TAKE 1 TABLET (20 MG TOTAL) BY MOUTH DAILY., Disp: 30 tablet, Rfl: 0   feeding supplement, GLUCERNA SHAKE, (GLUCERNA SHAKE) LIQD, Take 237 mLs by mouth 3 (three) times daily between meals., Disp: 2370 mL, Rfl: 0   gabapentin (NEURONTIN) 300 MG capsule, Take 2 capsules (600 mg total) by mouth 3 (three) times daily., Disp: 180 capsule, Rfl: 11   Insulin Pen Needle 32G X 4 MM MISC, Use 4-5x a day, Disp: 300 each, Rfl: 3   lidocaine (LIDODERM) 5 %, Place 1 patch onto the skin daily. Remove & Discard patch within 12 hours or as directed by MD, Disp: 30 patch, Rfl: 0   metoCLOPramide (REGLAN) 10 MG tablet, Take 1 tablet (10 mg total) by mouth 3 (three) times daily with meals for 7 days, THEN 1 tablet (10 mg total) every 8 (eight) hours as needed for up to 7 days for nausea., Disp: 42 tablet, Rfl: 0   Multiple Vitamin (MULTIVITAMIN WITH MINERALS) TABS tablet, Take 1 tablet by mouth daily., Disp: 30 tablet, Rfl: 0   ondansetron (ZOFRAN) 4 MG tablet, Take 1 tablet (4 mg total) by mouth every 6 (six) hours as needed for nausea or vomiting., Disp: 20 tablet, Rfl: 0   pantoprazole (PROTONIX) 40 MG tablet, Take 40 mg by mouth 2 (two) times daily., Disp: ,  Rfl:    XIFAXAN 550 MG TABS tablet, Take 550 mg by mouth 3 (three) times daily., Disp: , Rfl:    insulin aspart (NOVOLOG) 100 UNIT/ML FlexPen, Inject 10 Units into the skin 3 (three) times daily with meals., Disp: 15 mL, Rfl: 2   insulin glargine (LANTUS) 100 UNIT/ML Solostar Pen, Inject 30 Units into the skin daily., Disp: 9 mL, Rfl: 2   Objective:     BP 120/84   Pulse (!) 110   Temp 98 F (36.7 C)   Ht 6\' 3"  (1.905 m)   Wt 140 lb 6.4 oz (63.7 kg)   SpO2 100%   BMI 17.55 kg/m    Physical Exam Constitutional:      General: He is not in acute distress.    Appearance:  Normal appearance. He is not ill-appearing, toxic-appearing or diaphoretic.  HENT:     Head: Normocephalic and atraumatic.     Right Ear: External ear normal.     Left Ear: External ear normal.     Mouth/Throat:     Mouth: Mucous membranes are moist.     Pharynx: Oropharynx is clear. No oropharyngeal exudate or posterior oropharyngeal erythema.  Eyes:     General: No scleral icterus.       Right eye: No discharge.        Left eye: No discharge.     Extraocular Movements: Extraocular movements intact.     Conjunctiva/sclera: Conjunctivae normal.     Pupils: Pupils are equal, round, and reactive to light.  Cardiovascular:     Rate and Rhythm: Regular rhythm. Tachycardia present.  Pulmonary:     Effort: Pulmonary effort is normal. No respiratory distress.     Breath sounds: Normal breath sounds.  Abdominal:     General: Bowel sounds are normal.  Musculoskeletal:     Cervical back: No rigidity or tenderness.  Skin:    General: Skin is warm and dry.  Neurological:     Mental Status: He is alert and oriented to person, place, and time.  Psychiatric:        Mood and Affect: Mood normal.        Behavior: Behavior normal.      No results found for any visits on 05/29/23.    The ASCVD Risk score (Arnett DK, et al., 2019) failed to calculate for the following reasons:   The valid total cholesterol range is 130 to 320 mg/dL    Assessment & Plan:   Depression with anxiety  Nausea and vomiting, unspecified vomiting type -     CBC -     Basic metabolic panel -     Magnesium -     Phosphorus  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)  Tachycardia -     EKG 12-Lead -     Ambulatory referral to Cardiology    Return in about 3 months (around 08/29/2023).  Follow-up as scheduled with GI for next week and endocrinology in a few weeks.  Continue Lexapro.  Urged him to stop smoking marijuana.  EKG shows sinus tachycardia with a rate of 100.  Inverted  T waves seen in V2 through V4 as noted on prior EKGs.  He is having no chest pain or shortness of breath.  Continue Lexapro.  Mliss Sax, MD

## 2023-05-31 ENCOUNTER — Encounter (INDEPENDENT_AMBULATORY_CARE_PROVIDER_SITE_OTHER): Payer: Self-pay

## 2023-06-01 ENCOUNTER — Encounter: Payer: Self-pay | Admitting: Family Medicine

## 2023-06-01 LAB — HM DIABETES EYE EXAM

## 2023-06-07 ENCOUNTER — Ambulatory Visit: Payer: Managed Care, Other (non HMO) | Admitting: Internal Medicine

## 2023-06-07 ENCOUNTER — Encounter: Payer: Self-pay | Admitting: Internal Medicine

## 2023-06-07 ENCOUNTER — Other Ambulatory Visit: Payer: Managed Care, Other (non HMO)

## 2023-06-07 VITALS — BP 130/70 | HR 115 | Ht 75.0 in | Wt 147.6 lb

## 2023-06-07 DIAGNOSIS — E785 Hyperlipidemia, unspecified: Secondary | ICD-10-CM | POA: Diagnosis not present

## 2023-06-07 DIAGNOSIS — Z794 Long term (current) use of insulin: Secondary | ICD-10-CM

## 2023-06-07 DIAGNOSIS — E1010 Type 1 diabetes mellitus with ketoacidosis without coma: Secondary | ICD-10-CM

## 2023-06-07 LAB — HEMOGLOBIN A1C: Hemoglobin A1C: 8.4

## 2023-06-07 MED ORDER — LYUMJEV KWIKPEN 100 UNIT/ML ~~LOC~~ SOPN: 8.0000 [IU] | PEN_INJECTOR | Freq: Four times a day (QID) | SUBCUTANEOUS | 3 refills | Status: DC

## 2023-06-07 NOTE — Patient Instructions (Signed)
Visit Information  The Patient                                              was given information about Medicaid Managed Care team care coordination services and consented to engagement with the [facility] Managed Care team.   Social Worker will follow up in 30-45 days.   Abelino Derrick, MHA [facility]  Managed [facility] Social Worker [phone]

## 2023-06-07 NOTE — Patient Instructions (Addendum)
Please try to restart the pump: - Basal rates: 12 am: 1.35 units/h - Insulin to carb ratio: 12 am: 1 - Target: 12 am: 140 - Correction factor (insulin sensitivity factor):  12 am: 28 - Active insulin time: 4h  Enter between 8-12 g carbs in the pump and start the boluses right before meals.  If you cannot start the pump, continue: - Lantus 34 units daily  Increase: - Lyumjev 8-12 units before meals  Please return in 3  months.

## 2023-06-07 NOTE — Progress Notes (Signed)
Patient ID: Edward Walls, male   DOB: 11-14-1980, 42 y.o.   MRN: [id number]    HPI: Edward Walls is a 42 y.o.-year-old male, returning for follow-up for DM1, dx in 2017, insulin-dependent, uncontrolled, with complications (recurrent DKA episodes).  Last visit 4 months ago.  Interim history: No increased urination, blurry vision, nausea, chest pain.  He can hear well since last visit. He continues to have nausea and vomiting.  He was admitted 10/25/2022 for the same symptoms and suspicion of gastroparesis.  Of note, an abdominal CT scan and an EGD were normal.  He was admitted again 01/26/2023 for the same symptoms in DKA.  A CT scan of the abdomen was again normal. He was started on Reglan.  Since last visit, he was seen in the emergency department 4 times since our last visit for dehydration/nausea/hyperglycemia. Now on Reglan and Rifaximin.  This is working very well for him and he tells me that he feels much better.  He also gained 17 pounds in the last 4 months. He was in the emergency room in 05/22/2023 followed by an admission for DKA - had heart flutter and SOB.  He was referred to cardiology - appt coming up. Before last visit he came off the pump for 1 week because of alarms.  He is still off, after the controller froze.  He contacted the company and they sent him instructions to fix it but he was not able to do so.  Pump: -OmniPod 5 >> now basal-bolus insulin regimen  Insulin: -Lantus -Humalog >> Lyumjev  CGM: -Dexcom G6  Reviewed HbA1c levels: Lab Results  Component Value Date   HGBA1C 9.7 (H) 01/29/2023   HGBA1C 10.6 (H) 09/29/2022   HGBA1C 11.0 (A) 07/28/2022   HGBA1C >15.5 (H) 05/25/2022   HGBA1C >15 02/20/2022   HGBA1C >15 09/29/2021   HGBA1C 12.2 (A) 07/01/2021   HGBA1C 13.7 (A) 11/24/2020   HGBA1C >15.0 09/24/2020   HGBA1C >15.5 (H) 06/04/2020  07/05/2017: GAD Ab <5  Previously on OmniPod 5 insulin pump: - Basal rates: 12 am: 1.25 >> 1.35 units/h - Insulin  to carb ratio: 12 am: 1 - Target: 12 am: 140 - Correction factor (insulin sensitivity factor):  12 am: 28 - Active insulin time: 4h - Changes infusion site: q3 days  Total daily dose from basal insulin: 79% Total daily dose from bolus insulin: 21% Total daily dose 17.5-30 units Number of boluses a day: 0.7! Stayed in the auto mode 91% of the time  At last visit he was off the pump for 1 week and with using the following regimen: - Lantus 30 >> 34 units daily  at bedtime (in am) - Lyumjev 10 >> 8 units before meals  He was previously on the freestyle libre CGM >> Dexcom G6 -no more irritation at the site after he learned to attach it correctly - however, we could not download the reports at last visit and again today as he was not able to login into Clarity app and he is off the pump.  We helped him get into clarity, but the latest reports are from 01/2023.  We discussed that this will most likely start again when he starts a new sensor. - am: 220 (before going to bed) >> 180 - 2h after b'fast: ? - lunch: 180-190 >> 180-190 - 2h after lunch: ? - dinner: 200 >> 180 - 2h after dinner: 200 >> ? - bedtime: see above  Previously:  Previously:   Previously  Previously:   Lowest sugar was  320 >> 50s >> 90 >> 70 >> LO; he has hypoglycemia awareness in the 80s. Highest sugar was HI >> HI >> 300s >> 409 at admission 01/2023 >> 240s. He had 1 previous DKA episode when he could not afford his insulin in the past.  Pt's meals are: - Breakfast: oatmeal + banana - Lunch: meatloaf, chilli, salad - Dinner: meat + veggies - Snacks: 8 pm  - crackers, at work -he works nights  No CKD, last BUN/creatinine:  Lab Results  Component Value Date   BUN 24 (H) 05/29/2023   BUN 8 05/26/2023   CREATININE 0.75 05/29/2023   CREATININE 0.60 (L) 05/26/2023   He has dyslipidemia: Lab Results  Component Value Date   CHOL 85 (L) 07/28/2022   HDL 39 (L) 07/28/2022   LDLCALC 37 07/28/2022    TRIG 26 07/28/2022   CHOLHDL 2.2 07/28/2022   -Last eye exam 06/01/2023: No DR  -He denies numbness and tingling in his feet.  Last foot exam was in 07/2022.  Pt has FH of DM in M, F, all 3 of his brothers - all have DM2.  ROS: + See HPI  I reviewed pt's medications, allergies, PMH, social hx, family hx, and changes were documented in the history of present illness. Otherwise, unchanged from my initial visit note.  Past Medical History:  Diagnosis Date   Diabetes mellitus without complication (HCC)    Leg pain    Leg weakness    Seasonal allergies    Past Surgical History:  Procedure Laterality Date   DENTAL SURGERY     ESOPHAGOGASTRODUODENOSCOPY (EGD) WITH PROPOFOL N/A 10/27/2022   Procedure: ESOPHAGOGASTRODUODENOSCOPY (EGD) WITH PROPOFOL;  Surgeon: Sherrilyn Rist, MD;  Location: [facility];  Service: Gastroenterology;  Laterality: N/A;   Social History   Socioeconomic History   Marital status: Single    Spouse name: Not on file   Number of children:  To: 69 and 17 10/2008   Years of education: Not on file   Highest education level: Not on file  Occupational History    Distribution receiver for [employer]  Social Needs   Financial resource strain: Not on file   Food insecurity:    Worry: Not on file    Inability: Not on file   Transportation needs:    Medical: Not on file    Non-medical: Not on file  Tobacco Use   Smoking status: Former Smoker    Types: Cigarettes   Smokeless tobacco: Never Used  Substance and Sexual Activity   Alcohol use:  no    Alcohol/week:   Types:  Drug use: Not Currently   Sexual activity: Not on file  Lifestyle   Physical activity:    Days per week: Not on file    Minutes per session: Not on file   Stress: Not on file  Relationships   Social connections:    Talks on phone: Not on file    Gets together: Not on file    Attends religious service: Not on file    Active member of club or organization: Not on file    Attends  meetings of clubs or organizations: Not on file    Relationship status: Not on file   Intimate partner violence:    Fear of current or ex partner: Not on file    Emotionally abused: Not on file    Physically abused: Not on file    Forced sexual activity:  Not on file  Other Topics Concern   Not on file  Social History Narrative   Not on file   Current Outpatient Medications on File Prior to Visit  Medication Sig Dispense Refill   acetaminophen (TYLENOL) 325 MG tablet Take 2 tablets (650 mg total) by mouth every 6 (six) hours as needed for mild pain (or Fever >/= 101). 20 tablet 0   Continuous Glucose Sensor (DEXCOM G6 SENSOR) MISC Replace every 10 days 9 each 3   Continuous Glucose Transmitter (DEXCOM G6 TRANSMITTER) MISC 1 Device by Does not apply route every 3 (three) months. 1 each 3   escitalopram (LEXAPRO) 20 MG tablet TAKE 1 TABLET (20 MG TOTAL) BY MOUTH DAILY. 30 tablet 0   feeding supplement, GLUCERNA SHAKE, (GLUCERNA SHAKE) LIQD Take 237 mLs by mouth 3 (three) times daily between meals. 2370 mL 0   gabapentin (NEURONTIN) 300 MG capsule Take 2 capsules (600 mg total) by mouth 3 (three) times daily. 180 capsule 11   insulin aspart (NOVOLOG) 100 UNIT/ML FlexPen Inject 10 Units into the skin 3 (three) times daily with meals. 15 mL 2   insulin glargine (LANTUS) 100 UNIT/ML Solostar Pen Inject 30 Units into the skin daily. 9 mL 2   Insulin Pen Needle 32G X 4 MM MISC Use 4-5x a day 300 each 3   lidocaine (LIDODERM) 5 % Place 1 patch onto the skin daily. Remove & Discard patch within 12 hours or as directed by MD 30 patch 0   metoCLOPramide (REGLAN) 10 MG tablet Take 1 tablet (10 mg total) by mouth 3 (three) times daily with meals for 7 days, THEN 1 tablet (10 mg total) every 8 (eight) hours as needed for up to 7 days for nausea. 42 tablet 0   Multiple Vitamin (MULTIVITAMIN WITH MINERALS) TABS tablet Take 1 tablet by mouth daily. 30 tablet 0   ondansetron (ZOFRAN) 4 MG tablet Take 1 tablet  (4 mg total) by mouth every 6 (six) hours as needed for nausea or vomiting. 20 tablet 0   pantoprazole (PROTONIX) 40 MG tablet Take 40 mg by mouth 2 (two) times daily.     XIFAXAN 550 MG TABS tablet Take 550 mg by mouth 3 (three) times daily.     No current facility-administered medications on file prior to visit.   Allergies  Allergen Reactions   Other Other (See Comments)    All nuts Throat pain   Peanut-Containing Drug Products Other (See Comments)    Throat pain   Family History  Problem Relation Age of Onset   Diabetes Mellitus II Mother    Diabetes Mellitus II Father    Diabetes Mellitus II Brother    PE: BP 130/70   Pulse (!) 115   Ht 6\' 3"  (1.905 m)   Wt 147 lb 9.6 oz (67 kg)   SpO2 99%   BMI 18.45 kg/m  Wt Readings from Last 3 Encounters:  06/07/23 147 lb 9.6 oz (67 kg)  05/29/23 140 lb 6.4 oz (63.7 kg)  05/22/23 143 lb 11.8 oz (65.2 kg)        Constitutional: normal weight, in NAD Eyes:  EOMI, no exophthalmos ENT: no neck masses, no cervical lymphadenopathy Cardiovascular: tachycardia, RR, No MRG Respiratory: CTA B Musculoskeletal: no deformities Skin:no rashes Neurological: + tremor with outstretched hands Diabetic Foot Exam - Simple   Simple Foot Form Diabetic Foot exam was performed with the following findings: Yes 06/07/2023  3:41 PM  Visual Inspection No deformities, no ulcerations,  no other skin breakdown bilaterally: Yes Sensation Testing Intact to touch and monofilament testing bilaterally: Yes Pulse Check Posterior Tibialis and Dorsalis pulse intact bilaterally: Yes Comments Thick elongated halluceal nails B    ASSESSMENT: 1. DM1, insulin-dependent, uncontrolled, without long-term complications, but with DKA episodes  Component     Latest Ref Rng & Units 12/12/2018  Glucose, Plasma     65 - 99 mg/dL 161 (H)  C-Peptide     0.80 - 3.85 ng/mL 0.52 (L)  Islet Cell Ab     Neg:<1:1 Negative  ZNT8 Antibodies     U/mL <15   2.   Dyslipidemia  PLAN:  1. Patient with longstanding, very uncontrolled type 2 diabetes, with history of repeated DKA episodes for various reasons including not affording insulin in the past, putting Lantus into the pump instead of the rapid acting insulin, or, gastroparesis/nausea/vomiting.  Latest DKA episode was at the beginning of this month. -At last visit, sugars are finally improved after starting the OmniPod 5 pump, with an HbA1c of 9.7%, improved from 10.6%, improved from >15.5% at that time, he was actually off the pump due to alarm fatigue.  Also, we could not download his CGM as he was not logged onto the clarity app.  Reportedly, sugars appeared to still be elevated.  I advised him to go back to the pump as soon as possible.  We did not change his pump settings.  We discussed about varying the dose of insulin injected before meals and to start the injections 15 minutes before each meal. -At today's visit, he is still off the pump as his controller was defective.  I advised him to call the company right away and get another controller and start back on insulin pump.  Moreover, we were not able to review his CGM tracings because he was not logged on to Clarity app on his phone.  We connected him today, but and the report is not available yet. -Reviewing the blood sugars on his phone for the last 24 hours, it appears that sugars increase during his workday (he works nights) so we discussed about increasing the dose of Lyumjev.  Otherwise, it is difficult to make changes based only on 1 day.  I strongly advised him to start back on insulin pump and enter between 8 and 12 g of carbs per meal (he has an ICR of 1: 1). - I suggested to:  Patient Instructions  Please try to restart the pump: - Basal rates: 12 am: 1.35 units/h - Insulin to carb ratio: 12 am: 1 - Target: 12 am: 140 - Correction factor (insulin sensitivity factor):  12 am: 28 - Active insulin time: 4h  Enter between 8-12 g carbs in  the pump and start the boluses right before meals.  If you cannot start the pump, continue: - Lantus 34 units daily  Increase: - Lyumjev 8-12 units before meals  Please return in 3  months.  - we checked his HbA1c: 8.4% (lowest in a long time!) - advised to check sugars at different times of the day - 4x a day, rotating check times - advised for yearly eye exams >> he is UTD - return to clinic in 3 months  2.  Dyslipidemia -Reviewed latest lipid panel from 07/2022: HDL slightly low, otherwise fractions at goal: Lab Results  Component Value Date   CHOL 85 (L) 07/28/2022   HDL 39 (L) 07/28/2022   LDLCALC 37 07/28/2022   TRIG 26 07/28/2022   CHOLHDL  2.2 07/28/2022  -He is not on a statin  Carlus Pavlov, MD PhD [facility] Endocrinology
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
## 2023-06-19 ENCOUNTER — Emergency Department (HOSPITAL_COMMUNITY)
Admission: EM | Admit: 2023-06-19 | Discharge: 2023-06-19 | Disposition: A | Discharge status: Home / Self Care | Payer: Managed Care, Other (non HMO) | Attending: Emergency Medicine | Admitting: Emergency Medicine

## 2023-06-19 ENCOUNTER — Emergency Department (HOSPITAL_COMMUNITY): Payer: Managed Care, Other (non HMO)

## 2023-06-19 ENCOUNTER — Encounter (HOSPITAL_COMMUNITY): Payer: Self-pay

## 2023-06-19 DIAGNOSIS — Z794 Long term (current) use of insulin: Secondary | ICD-10-CM | POA: Insufficient documentation

## 2023-06-19 DIAGNOSIS — F1290 Cannabis use, unspecified, uncomplicated: Secondary | ICD-10-CM | POA: Insufficient documentation

## 2023-06-19 DIAGNOSIS — Z91010 Allergy to peanuts: Secondary | ICD-10-CM | POA: Insufficient documentation

## 2023-06-19 DIAGNOSIS — E109 Type 1 diabetes mellitus without complications: Secondary | ICD-10-CM | POA: Insufficient documentation

## 2023-06-19 DIAGNOSIS — R112 Nausea with vomiting, unspecified: Secondary | ICD-10-CM | POA: Diagnosis present

## 2023-06-19 DIAGNOSIS — Z1152 Encounter for screening for COVID-19: Secondary | ICD-10-CM | POA: Diagnosis not present

## 2023-06-19 LAB — SARS CORONAVIRUS 2 BY RT PCR: SARS Coronavirus 2 by RT PCR: NEGATIVE

## 2023-06-19 LAB — CBC WITH DIFFERENTIAL/PLATELET
Abs Immature Granulocytes: 0.01 10*3/uL (ref 0.00–0.07)
Basophils Absolute: 0 10*3/uL (ref 0.0–0.1)
Basophils Relative: 0 %
Eosinophils Absolute: 0 10*3/uL (ref 0.0–0.5)
Eosinophils Relative: 0 %
HCT: 40.8 % (ref 39.0–52.0)
Hemoglobin: 13 g/dL (ref 13.0–17.0)
Immature Granulocytes: 0 %
Lymphocytes Relative: 29 %
Lymphs Abs: 1.8 10*3/uL (ref 0.7–4.0)
MCH: 28.4 pg (ref 26.0–34.0)
MCHC: 31.9 g/dL (ref 30.0–36.0)
MCV: 89.1 fL (ref 80.0–100.0)
Monocytes Absolute: 0.8 10*3/uL (ref 0.1–1.0)
Monocytes Relative: 14 %
Neutro Abs: 3.5 10*3/uL (ref 1.7–7.7)
Neutrophils Relative %: 57 %
Platelets: 202 10*3/uL (ref 150–400)
RBC: 4.58 MIL/uL (ref 4.22–5.81)
RDW: 13 % (ref 11.5–15.5)
WBC: 6.1 10*3/uL (ref 4.0–10.5)
nRBC: 0 % (ref 0.0–0.2)

## 2023-06-19 LAB — BLOOD GAS, VENOUS
Acid-Base Excess: 2.2 mmol/L — ABNORMAL HIGH (ref 0.0–2.0)
Bicarbonate: 28.3 mmol/L — ABNORMAL HIGH (ref 20.0–28.0)
O2 Saturation: 47.6 %
Patient temperature: 37
pCO2, Ven: 49 mmHg (ref 44–60)
pH, Ven: 7.37 (ref 7.25–7.43)
pO2, Ven: <31 mmHg — CL (ref 32–45)

## 2023-06-19 LAB — LIPASE, BLOOD: Lipase: 23 U/L (ref 11–51)

## 2023-06-19 LAB — COMPREHENSIVE METABOLIC PANEL
ALT: 40 U/L (ref 0–44)
AST: 21 U/L (ref 15–41)
Albumin: 4.3 g/dL (ref 3.5–5.0)
Alkaline Phosphatase: 58 U/L (ref 38–126)
Anion gap: 14 (ref 5–15)
BUN: 26 mg/dL — ABNORMAL HIGH (ref 6–20)
CO2: 25 mmol/L (ref 22–32)
Calcium: 8.7 mg/dL — ABNORMAL LOW (ref 8.9–10.3)
Chloride: 99 mmol/L (ref 98–111)
Creatinine, Ser: 0.71 mg/dL (ref 0.61–1.24)
GFR, Estimated: >60 mL/min (ref >60)
Glucose, Bld: 111 mg/dL — ABNORMAL HIGH (ref 70–99)
Potassium: 4.1 mmol/L (ref 3.5–5.1)
Sodium: 138 mmol/L (ref 135–145)
Total Bilirubin: 2.4 mg/dL — ABNORMAL HIGH (ref 0.3–1.2)
Total Protein: 7.3 g/dL (ref 6.5–8.1)

## 2023-06-19 LAB — MAGNESIUM: Magnesium: 2.1 mg/dL (ref 1.7–2.4)

## 2023-06-19 MED ORDER — LACTATED RINGERS IV BOLUS
1000.0000 mL | Freq: Once | INTRAVENOUS | Status: AC
  Administered 2023-06-19: 1000 mL via INTRAVENOUS

## 2023-06-19 MED ORDER — HALOPERIDOL LACTATE 5 MG/ML IJ SOLN
2.0000 mg | Freq: Once | INTRAMUSCULAR | Status: AC
  Administered 2023-06-19: 2 mg via INTRAVENOUS
  Filled 2023-06-19: qty 1

## 2023-06-19 MED ORDER — ONDANSETRON HCL 4 MG/2ML IJ SOLN
4.0000 mg | Freq: Once | INTRAMUSCULAR | Status: AC
  Administered 2023-06-19: 4 mg via INTRAVENOUS
  Filled 2023-06-19: qty 2

## 2023-06-19 NOTE — ED Provider Notes (Addendum)
Edward Walls EMERGENCY DEPARTMENT AT [facility] Provider Note   CSN: [id number] Arrival date & time: 06/19/23  1355     History  Chief Complaint  Patient presents with   Fever    Edward Walls is a 42 y.o. male.  The patient is a 86 male with a past medical history of insulin-dependent type 1 diabetes, DKA, cannabinoid hyperemesis syndrome and SIBO who presents to the ED for nausea and vomiting.  He reports 3 days of persistent nausea and vomiting with approximately 10 episodes in the last 24 hours.  Subjective fevers at home as well.  Secondary complaints of shortness of breath and palpitations but denies chest pain.  Reports that his sugars have been in the 300s during the last 24 hours and he checks them a few times a day with his CGM.  Recently completed course of rifaximin for SIBO.  Last marijuana use was 4 days ago.  Denies alcohol or other drug use.   Fever      Home Medications Prior to Admission medications   Medication Sig Start Date End Date Taking? Authorizing Provider  acetaminophen (TYLENOL) 325 MG tablet Take 2 tablets (650 mg total) by mouth every 6 (six) hours as needed for mild pain (or Fever >/= 101). 05/26/23   Marguerita Merles Latif, DO  Continuous Glucose Sensor (DEXCOM G6 SENSOR) MISC Replace every 10 days 03/02/23   Carlus Pavlov, MD  Continuous Glucose Transmitter (DEXCOM G6 TRANSMITTER) MISC 1 Device by Does not apply route every 3 (three) months. 03/02/23   Carlus Pavlov, MD  escitalopram (LEXAPRO) 20 MG tablet TAKE 1 TABLET (20 MG TOTAL) BY MOUTH DAILY. 05/28/23   Mliss Sax, MD  feeding supplement, GLUCERNA SHAKE, (GLUCERNA SHAKE) LIQD Take 237 mLs by mouth 3 (three) times daily between meals. 05/26/23   Marguerita Merles Latif, DO  gabapentin (NEURONTIN) 300 MG capsule Take 2 capsules (600 mg total) by mouth 3 (three) times daily. 06/27/22   Levert Feinstein, MD  insulin glargine (LANTUS) 100 UNIT/ML Solostar Pen Inject 30 Units into the  skin daily. 01/29/23 05/22/23  [name], [name], DO  Insulin Lispro-aabc (LYUMJEV KWIKPEN) 100 UNIT/ML KwikPen Inject 8-12 Units into the skin in the morning, at noon, in the evening, and at bedtime. 06/07/23   Carlus Pavlov, MD  Insulin Pen Needle 32G X 4 MM MISC Use 4-5x a day 05/26/22   Marguerita Merles Latif, DO  lidocaine (LIDODERM) 5 % Place 1 patch onto the skin daily. Remove & Discard patch within 12 hours or as directed by MD 01/30/23   [name], [name], DO  metoCLOPramide (REGLAN) 10 MG tablet Take 1 tablet (10 mg total) by mouth 3 (three) times daily with meals for 7 days, THEN 1 tablet (10 mg total) every 8 (eight) hours as needed for up to 7 days for nausea. 05/26/23 06/09/23  Marguerita Merles Latif, DO  Multiple Vitamin (MULTIVITAMIN WITH MINERALS) TABS tablet Take 1 tablet by mouth daily. 05/27/23   Marguerita Merles Latif, DO  ondansetron (ZOFRAN) 4 MG tablet Take 1 tablet (4 mg total) by mouth every 6 (six) hours as needed for nausea or vomiting. 12/29/22   [name], MD  pantoprazole (PROTONIX) 40 MG tablet Take 40 mg by mouth 2 (two) times daily. 04/04/23   [provider]  XIFAXAN 550 MG TABS tablet Take 550 mg by mouth 3 (three) times daily. 05/02/23   [provider]      Allergies    Other and  Peanut-containing drug products    Review of Systems   Review of Systems  Constitutional:  Positive for fever.  All other systems reviewed and are negative.   Physical Exam Updated Vital Signs BP (!) 152/86   Pulse (!) 105   Temp 98.2 F (36.8 C) (Oral)   Resp 17   SpO2 100%  Physical Exam Vitals and nursing note reviewed.  HENT:     Mouth/Throat:     Mouth: Mucous membranes are moist.  Cardiovascular:     Rate and Rhythm: Normal rate and regular rhythm.     Pulses: Normal pulses.  Pulmonary:     Effort: Pulmonary effort is normal.     Breath sounds: Normal breath sounds.  Abdominal:     General: Abdomen is flat. There is no distension.     Palpations:  Abdomen is soft.     Tenderness: There is no abdominal tenderness. There is no guarding or rebound.  Skin:    General: Skin is warm and dry.  Neurological:     Mental Status: He is alert and oriented to person, place, and time.  Psychiatric:        Mood and Affect: Mood normal.     ED Results / Procedures / Treatments   Labs (all labs ordered are listed, but only abnormal results are displayed) Labs Reviewed  COMPREHENSIVE METABOLIC PANEL - Abnormal; Notable for the following components:      Result Value   Glucose, Bld 111 (*)    BUN 26 (*)    Calcium 8.7 (*)    Total Bilirubin 2.4 (*)    All other components within normal limits  BLOOD GAS, VENOUS - Abnormal; Notable for the following components:   pO2, Ven <31 (*)    Bicarbonate 28.3 (*)    Acid-Base Excess 2.2 (*)    All other components within normal limits  SARS CORONAVIRUS 2 BY RT PCR  LIPASE, BLOOD  CBC WITH DIFFERENTIAL/PLATELET  MAGNESIUM    EKG None  Radiology No results found.  Procedures Procedures    Medications Ordered in ED Medications  ondansetron (ZOFRAN) injection 4 mg (4 mg Intravenous Given 06/19/23 1459)  lactated ringers bolus 1,000 mL (1,000 mLs Intravenous New Bag/Given 06/19/23 1450)  haloperidol lactate (HALDOL) injection 2 mg (2 mg Intravenous Given 06/19/23 1539)    ED Course/ Medical Decision Making/ A&P Clinical Course as of 06/19/23 1628  Tue Jun 19, 2023  1554 2 view chest reviewed independently by me.  No acute disease [MP]  1622 Reassessed patient.  He reports improvement in nausea and vomiting following 2 mg Haldol.  Heart rate decreased now in the 90s at rest.  He was able to tolerate p.o. liquids and feels comfortable plan for discharge home and PCP follow-up.  He does have Zofran at home as well. [MP]    Clinical Course User Index [MP] [name], DO                                 Medical Decision Making 42 year old male with history as above presenting for 3 days  of nausea vomiting and subjective fevers.  Recently completed course of rifaximin for SIBO.  Recent admission for DKA.  Reports sugars have been in 300s at home.  Dry heaving on exam.  Exam notable for benign abdomen.  Presentation concerning for potential repeat episode of DKA.  Will obtain laboratory workup including CBC with  differential, CMP, lipase, venous blood gas and magnesium.  Will provide IV rehydration with lactated Ringer's and nausea/vomiting controlled with Zofran.  Secondary complaint of palpitations and shortness of breath which may be due to dehydration but will obtain EKG to evaluate for dysrhythmia and chest x-ray to evaluate for focal consolidation that would be concerning for pneumonia.  Will obtain COVID swab to evaluate for acute COVID infection.  Low suspicion for ACS given lack of chest pain and relatively young age.  No indication for CT abdomen pelvis at this time.  Patient is afebrile and is not meeting SIRS criteria upon my initial assessment.  Will reassess tachycardia following hydration  Amount and/or Complexity of Data Reviewed Labs: ordered.    Details: Reviewed labs.  No evidence of DKA on venous blood gas.  No significant electrolyte abnormalities.  Blood glucose 111.  CBC shows no evidence of leukocytosis Radiology: ordered. ECG/medicine tests: independent interpretation performed.    Details: Normal sinus rhythm at 92 bpm QTc 390  Risk Prescription drug management.           Final Clinical Impression(s) / ED Diagnoses Final diagnoses:  Nausea and vomiting, unspecified vomiting type  Type 1 diabetes mellitus without complication [facility])    Rx / DC Orders ED Discharge Orders     None         [name], DO 06/19/23 1627    Estelle June A, DO 06/19/23 1629

## 2023-06-19 NOTE — Discharge Instructions (Signed)
You were seen in the emergency department for nausea and vomiting Your blood work, EKG and chest x-ray all looked okay today Your symptoms improved after IV fluids, Zofran and a medication called Haldol It is important that you continue to refrain from marijuana as this can make nausea and vomiting worse Please follow-up with your primary care doctor in 1 week for reevaluation Return to the emergency department for uncontrolled vomiting, if you are unable to eat or drink, experience severe pain or any other concerns

## 2023-06-19 NOTE — ED Triage Notes (Addendum)
BIB [facility] from home c/o fever Dx with SIBO x 2 months ago. Just completed 60 day ABT. Sunday symptoms of fatigue, febrile, n/v. 4mg  zofran 500 NS given by EMS.

## 2023-06-23 ENCOUNTER — Other Ambulatory Visit: Payer: Self-pay | Admitting: Family Medicine

## 2023-06-29 ENCOUNTER — Ambulatory Visit: Payer: Managed Care, Other (non HMO) | Admitting: Family Medicine

## 2023-07-03 ENCOUNTER — Ambulatory Visit: Payer: Managed Care, Other (non HMO) | Admitting: Family Medicine

## 2023-07-10 ENCOUNTER — Other Ambulatory Visit: Payer: Managed Care, Other (non HMO)
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
## 2023-07-10 NOTE — Patient Instructions (Signed)
Visit Information  The Patient                                              was given information about Medicaid Managed Care team care coordination services and consented to engagement with the [facility] Managed Care team.   The  Patient                                              has been provided with contact information for the Managed Medicaid care management team and has been advised to call with any health related questions or concerns.   Abelino Derrick, MHA [facility]  Managed [facility] Social Worker [phone]

## 2023-07-21 NOTE — Progress Notes (Deleted)
Cardiology Office Note:    Date:  07/21/2023   ID:  Edward Walls, DOB 1980/10/27, MRN [id number]  PCP:  Mliss Sax, MD   [facility] Providers Cardiologist:  None { Click to update primary MD,subspecialty MD or APP then REFRESH:1}    Referring MD: Mliss Sax   No chief complaint on file. ***  History of Present Illness:    Edward Walls is a 42 y.o. male seen at the request of Dr Doreene Burke for evaluation of tachycardia and abnormal Ecg. He has a history of DM type 1 on insulin.   Past Medical History:  Diagnosis Date   Diabetes mellitus without complication (HCC)    Leg pain    Leg weakness    Seasonal allergies     Past Surgical History:  Procedure Laterality Date   DENTAL SURGERY     ESOPHAGOGASTRODUODENOSCOPY (EGD) WITH PROPOFOL N/A 10/27/2022   Procedure: ESOPHAGOGASTRODUODENOSCOPY (EGD) WITH PROPOFOL;  Surgeon: Sherrilyn Rist, MD;  Location: [facility];  Service: Gastroenterology;  Laterality: N/A;    Current Medications: No outpatient medications have been marked as taking for the 07/27/23 encounter (Appointment) with [name], Arav Bannister M, MD.     Allergies:   Other and Peanut-containing drug products   Social History   Socioeconomic History   Marital status: Single    Spouse name: Not on file   Number of children: 2   Years of education: 12   Highest education level: Some college, no degree  Occupational History   Not on file  Tobacco Use   Smoking status: Former    Types: Cigarettes   Smokeless tobacco: Never  Substance and Sexual Activity   Alcohol use: Yes    Alcohol/week: 0.0 standard drinks of alcohol    Comment: occasional    Drug use: Never   Sexual activity: Not on file  Other Topics Concern   Not on file  Social History Narrative   Lives at home with girlfriend.   Right-handed.   Caffeine use: occasional use   Social Determinants of Health   Financial Resource Strain: Medium Risk (03/29/2023)    Overall Financial Resource Strain (CARDIA)    Difficulty of Paying Living Expenses: Somewhat hard  Food Insecurity: Food Insecurity Present (05/23/2023)   Hunger Vital Sign    Worried About Running Out of Food in the Last Year: Sometimes true    Ran Out of Food in the Last Year: Never true  Transportation Needs: Unmet Transportation Needs (05/23/2023)   PRAPARE - Administrator, Civil Service (Medical): Yes    Lack of Transportation (Non-Medical): Yes  Physical Activity: Insufficiently Active (03/29/2023)   Exercise Vital Sign    Days of Exercise per Week: 2 days    Minutes of Exercise per Session: 10 min  Stress: No Stress Concern Present (03/29/2023)   [employer] of Occupational Health - Occupational Stress Questionnaire    Feeling of Stress : Only a little  Social Connections: Unknown (03/29/2023)   Social Connection and Isolation Panel [NHANES]    Frequency of Communication with Friends and Family: More than three times a week    Frequency of Social Gatherings with Friends and Family: Once a week    Attends Religious Services: Patient declined    Database administrator or Organizations: No    Attends Engineer, structural: Not on file    Marital Status: Never married     Family History: The patient's ***family history includes  Diabetes Mellitus II in his brother, father, and mother.  ROS:   Please see the history of present illness.    *** All other systems reviewed and are negative.  EKGs/Labs/Other Studies Reviewed:    The following studies were reviewed today: ***      Recent Labs: 03/26/2023: TSH 0.395 06/19/2023: ALT 40; BUN 26; Creatinine, Ser 0.71; Hemoglobin 13.0; Magnesium 2.1; Platelets 202; Potassium 4.1; Sodium 138  Recent Lipid Panel    Component Value Date/Time   CHOL 85 (L) 07/28/2022 1556   TRIG 26 07/28/2022 1556   HDL 39 (L) 07/28/2022 1556   CHOLHDL 2.2 07/28/2022 1556   CHOLHDL 5 05/19/2021 1046   VLDL 18.8 05/19/2021 1046    LDLCALC 37 07/28/2022 1556     Risk Assessment/Calculations:   {Does this patient have ATRIAL FIBRILLATION?[phone]}  No BP recorded.  {Refresh Note OR Click here to enter BP  :1}***         Physical Exam:    VS:  There were no vitals taken for this visit.    Wt Readings from Last 3 Encounters:  06/07/23 147 lb 9.6 oz (67 kg)  05/29/23 140 lb 6.4 oz (63.7 kg)  05/22/23 143 lb 11.8 oz (65.2 kg)     GEN: *** Well nourished, well developed in no acute distress HEENT: Normal NECK: No JVD; No carotid bruits LYMPHATICS: No lymphadenopathy CARDIAC: ***RRR, no murmurs, rubs, gallops RESPIRATORY:  Clear to auscultation without rales, wheezing or rhonchi  ABDOMEN: Soft, non-tender, non-distended MUSCULOSKELETAL:  No edema; No deformity  SKIN: Warm and dry NEUROLOGIC:  Alert and oriented x 3 PSYCHIATRIC:  Normal affect   ASSESSMENT:    No diagnosis found. PLAN:    In order of problems listed above:  ***      {Are you ordering a CV Procedure (e.g. stress test, cath, DCCV, TEE, etc)?   Press F2        :[id number]}    Medication Adjustments/Labs and Tests Ordered: Current medicines are reviewed at length with the patient today.  Concerns regarding medicines are outlined above.  No orders of the defined types were placed in this encounter.  No orders of the defined types were placed in this encounter.   There are no Patient Instructions on file for this visit.   Signed, Karlis Cregg [name], MD  07/21/2023 7:39 AM    [facility]

## 2023-07-25 ENCOUNTER — Telehealth: Payer: Self-pay | Admitting: Family Medicine

## 2023-07-25 NOTE — Telephone Encounter (Signed)
I[place] [phone]  Pt needs a work letter saying he was able to return to work full duty on 07/03/2023   He would like to pick up a hard copy. Please call when it is ready.

## 2023-07-25 NOTE — Telephone Encounter (Signed)
He would also like it sent through [facility] ans asking as soon as you can.

## 2023-07-26 ENCOUNTER — Ambulatory Visit: Payer: Managed Care, Other (non HMO) | Admitting: Family Medicine

## 2023-07-26 ENCOUNTER — Encounter: Payer: Self-pay | Admitting: Family Medicine

## 2023-07-26 VITALS — BP 130/84 | HR 91 | Temp 97.9°F | Ht 75.0 in | Wt 154.2 lb

## 2023-07-26 DIAGNOSIS — F418 Other specified anxiety disorders: Secondary | ICD-10-CM

## 2023-07-26 DIAGNOSIS — Z7689 Persons encountering health services in other specified circumstances: Secondary | ICD-10-CM | POA: Insufficient documentation

## 2023-07-26 MED ORDER — ESCITALOPRAM OXALATE 10 MG PO TABS: 20.0000 mg | ORAL_TABLET | Freq: Every day | ORAL | 5 refills | Status: DC

## 2023-07-26 NOTE — Progress Notes (Signed)
Established Patient Office Visit   Subjective:  Patient ID: Edward Walls, male    DOB: 1981/09/25  Age: 42 y.o. MRN: [id number]  Chief Complaint  Patient presents with   Follow-up    Pt states he needs a work note to return to work.    HPI Encounter Diagnoses  Name Primary?   Depression with anxiety Yes   Return to work evaluation    Here for follow-up of depression.  Doing well with Lexapro 20mg  daily.  "My head is in a better place".  He would like to continue it.  Needs to return to work note on 9/17.  He has been working doing well.  He just needs to have a note releasing him to full duty.  Blood sugars have been in the 100-1 40 range.  He is no longer smoking marijuana   Review of Systems  Constitutional: Negative.   HENT: Negative.    Eyes:  Negative for blurred vision, discharge and redness.  Respiratory: Negative.    Cardiovascular: Negative.   Gastrointestinal:  Negative for abdominal pain.  Genitourinary: Negative.   Musculoskeletal: Negative.  Negative for myalgias.  Skin:  Negative for rash.  Neurological:  Negative for tingling, loss of consciousness and weakness.  Endo/Heme/Allergies:  Negative for polydipsia.     Current Outpatient Medications:    acetaminophen (TYLENOL) 325 MG tablet, Take 2 tablets (650 mg total) by mouth every 6 (six) hours as needed for mild pain (or Fever >/= 101)., Disp: 20 tablet, Rfl: 0   Continuous Glucose Sensor (DEXCOM G6 SENSOR) MISC, Replace every 10 days, Disp: 9 each, Rfl: 3   Continuous Glucose Transmitter (DEXCOM G6 TRANSMITTER) MISC, 1 Device by Does not apply route every 3 (three) months., Disp: 1 each, Rfl: 3   escitalopram (LEXAPRO) 10 MG tablet, Take 2 tablets (20 mg total) by mouth daily., Disp: 60 tablet, Rfl: 5   escitalopram (LEXAPRO) 20 MG tablet, TAKE 1 TABLET (20 MG TOTAL) BY MOUTH DAILY., Disp: 90 tablet, Rfl: 0   feeding supplement, GLUCERNA SHAKE, (GLUCERNA SHAKE) LIQD, Take 237 mLs by mouth 3 (three) times  daily between meals., Disp: 2370 mL, Rfl: 0   gabapentin (NEURONTIN) 300 MG capsule, Take 2 capsules (600 mg total) by mouth 3 (three) times daily., Disp: 180 capsule, Rfl: 11   Insulin Lispro-aabc (LYUMJEV KWIKPEN) 100 UNIT/ML KwikPen, Inject 8-12 Units into the skin in the morning, at noon, in the evening, and at bedtime., Disp: 30 mL, Rfl: 3   Insulin Pen Needle 32G X 4 MM MISC, Use 4-5x a day, Disp: 300 each, Rfl: 3   lidocaine (LIDODERM) 5 %, Place 1 patch onto the skin daily. Remove & Discard patch within 12 hours or as directed by MD, Disp: 30 patch, Rfl: 0   Multiple Vitamin (MULTIVITAMIN WITH MINERALS) TABS tablet, Take 1 tablet by mouth daily., Disp: 30 tablet, Rfl: 0   ondansetron (ZOFRAN) 4 MG tablet, Take 1 tablet (4 mg total) by mouth every 6 (six) hours as needed for nausea or vomiting., Disp: 20 tablet, Rfl: 0   pantoprazole (PROTONIX) 40 MG tablet, Take 40 mg by mouth 2 (two) times daily., Disp: , Rfl:    insulin glargine (LANTUS) 100 UNIT/ML Solostar Pen, Inject 30 Units into the skin daily., Disp: 9 mL, Rfl: 2   metoCLOPramide (REGLAN) 10 MG tablet, Take 1 tablet (10 mg total) by mouth 3 (three) times daily with meals for 7 days, THEN 1 tablet (10 mg total) every 8 (  eight) hours as needed for up to 7 days for nausea., Disp: 42 tablet, Rfl: 0   XIFAXAN 550 MG TABS tablet, Take 550 mg by mouth 3 (three) times daily. (Patient not taking: Reported on 07/26/2023), Disp: , Rfl:    Objective:     BP 130/84   Pulse 91   Temp 97.9 F (36.6 C) (Temporal)   Ht 6\' 3"  (1.905 m)   Wt 154 lb 3.2 oz (69.9 kg)   SpO2 99%   BMI 19.27 kg/m    Physical Exam Constitutional:      General: He is not in acute distress.    Appearance: Normal appearance. He is not ill-appearing, toxic-appearing or diaphoretic.  HENT:     Head: Normocephalic and atraumatic.     Right Ear: External ear normal.     Left Ear: External ear normal.  Eyes:     General: No scleral icterus.       Right eye: No  discharge.        Left eye: No discharge.     Extraocular Movements: Extraocular movements intact.     Conjunctiva/sclera: Conjunctivae normal.  Pulmonary:     Effort: Pulmonary effort is normal. No respiratory distress.  Skin:    General: Skin is warm and dry.  Neurological:     Mental Status: He is alert and oriented to person, place, and time.  Psychiatric:        Mood and Affect: Mood normal.        Behavior: Behavior normal.      No results found for any visits on 07/26/23.    The ASCVD Risk score (Arnett DK, et al., 2019) failed to calculate for the following reasons:   The valid total cholesterol range is 130 to 320 mg/dL    Assessment & Plan:   Depression with anxiety -     Escitalopram Oxalate; Take 2 tablets (20 mg total) by mouth daily.  Dispense: 60 tablet; Refill: 5  Return to work evaluation    Return in about 6 months (around 01/24/2024).    Mliss Sax, MD

## 2023-07-26 NOTE — Telephone Encounter (Signed)
Spoke to patient and scheduled him an appointment to come in to be seen to discuss getting the letter.  Dm/cma

## 2023-07-26 NOTE — Telephone Encounter (Signed)
07/26/23 - Pt called following up on his work note. He wants it sent to his Mychart.

## 2023-07-26 NOTE — Telephone Encounter (Signed)
Pt called again today asking about a letter for him to return back to work

## 2023-07-26 NOTE — Telephone Encounter (Addendum)
Spoke with Matt at [employer]. Reports that no correspondence was ever received on this patient's short-term disability claim from a provider. We have no record in the pt file of paperwork been completed by PCP and the pt has not been in the office since August 2024.  Spoke with Dr. Doreene Burke who says he is ok with writing him a letter to return to work, however he will not complete any FMLA paperwork because he has not evaluated him in the office.

## 2023-07-27 ENCOUNTER — Ambulatory Visit: Payer: Managed Care, Other (non HMO) | Admitting: Cardiology

## 2023-08-24 ENCOUNTER — Other Ambulatory Visit: Payer: Self-pay

## 2023-08-24 DIAGNOSIS — Z794 Long term (current) use of insulin: Secondary | ICD-10-CM

## 2023-08-24 MED ORDER — INSULIN GLARGINE 100 UNIT/ML SOLOSTAR PEN: 30.0000 [IU] | PEN_INJECTOR | Freq: Every day | SUBCUTANEOUS | 2 refills | Status: DC

## 2023-09-07 ENCOUNTER — Ambulatory Visit: Payer: Managed Care, Other (non HMO) | Admitting: Internal Medicine

## 2023-09-20 ENCOUNTER — Other Ambulatory Visit: Payer: Self-pay

## 2023-09-20 ENCOUNTER — Emergency Department (HOSPITAL_COMMUNITY): Payer: Managed Care, Other (non HMO)

## 2023-09-20 ENCOUNTER — Encounter (HOSPITAL_COMMUNITY): Payer: Self-pay

## 2023-09-20 ENCOUNTER — Inpatient Hospital Stay (HOSPITAL_COMMUNITY)
Admission: EM | Admit: 2023-09-20 | Discharge: 2023-09-25 | DRG: 637 | Disposition: A | Discharge status: Home / Self Care | Payer: Managed Care, Other (non HMO) | Attending: Family Medicine | Admitting: Family Medicine

## 2023-09-20 DIAGNOSIS — Z91018 Allergy to other foods: Secondary | ICD-10-CM

## 2023-09-20 DIAGNOSIS — Z833 Family history of diabetes mellitus: Secondary | ICD-10-CM

## 2023-09-20 DIAGNOSIS — Z91010 Allergy to peanuts: Secondary | ICD-10-CM

## 2023-09-20 DIAGNOSIS — E1010 Type 1 diabetes mellitus with ketoacidosis without coma: Secondary | ICD-10-CM | POA: Diagnosis not present

## 2023-09-20 DIAGNOSIS — R066 Hiccough: Secondary | ICD-10-CM | POA: Diagnosis present

## 2023-09-20 DIAGNOSIS — E1165 Type 2 diabetes mellitus with hyperglycemia: Secondary | ICD-10-CM

## 2023-09-20 DIAGNOSIS — Z5941 Food insecurity: Secondary | ICD-10-CM

## 2023-09-20 DIAGNOSIS — Z87891 Personal history of nicotine dependence: Secondary | ICD-10-CM

## 2023-09-20 DIAGNOSIS — E43 Unspecified severe protein-calorie malnutrition: Secondary | ICD-10-CM | POA: Diagnosis present

## 2023-09-20 DIAGNOSIS — Z681 Body mass index (BMI) 19 or less, adult: Secondary | ICD-10-CM

## 2023-09-20 DIAGNOSIS — R000 Tachycardia, unspecified: Secondary | ICD-10-CM | POA: Diagnosis present

## 2023-09-20 DIAGNOSIS — F32A Depression, unspecified: Secondary | ICD-10-CM | POA: Diagnosis present

## 2023-09-20 DIAGNOSIS — Z794 Long term (current) use of insulin: Secondary | ICD-10-CM

## 2023-09-20 DIAGNOSIS — R112 Nausea with vomiting, unspecified: Secondary | ICD-10-CM | POA: Diagnosis not present

## 2023-09-20 DIAGNOSIS — Z5986 Financial insecurity: Secondary | ICD-10-CM

## 2023-09-20 DIAGNOSIS — M545 Low back pain, unspecified: Secondary | ICD-10-CM | POA: Diagnosis present

## 2023-09-20 DIAGNOSIS — G8929 Other chronic pain: Secondary | ICD-10-CM | POA: Diagnosis present

## 2023-09-20 DIAGNOSIS — E876 Hypokalemia: Secondary | ICD-10-CM | POA: Diagnosis present

## 2023-09-20 DIAGNOSIS — Z79899 Other long term (current) drug therapy: Secondary | ICD-10-CM

## 2023-09-20 DIAGNOSIS — K3184 Gastroparesis: Secondary | ICD-10-CM | POA: Diagnosis present

## 2023-09-20 DIAGNOSIS — E860 Dehydration: Secondary | ICD-10-CM | POA: Diagnosis present

## 2023-09-20 DIAGNOSIS — K638219 Small intestinal bacterial overgrowth, unspecified: Secondary | ICD-10-CM | POA: Diagnosis present

## 2023-09-20 DIAGNOSIS — K2100 Gastro-esophageal reflux disease with esophagitis, without bleeding: Secondary | ICD-10-CM | POA: Diagnosis present

## 2023-09-20 DIAGNOSIS — Z5982 Transportation insecurity: Secondary | ICD-10-CM

## 2023-09-20 DIAGNOSIS — E1043 Type 1 diabetes mellitus with diabetic autonomic (poly)neuropathy: Secondary | ICD-10-CM | POA: Diagnosis present

## 2023-09-20 LAB — BASIC METABOLIC PANEL
Anion gap: 21 — ABNORMAL HIGH (ref 5–15)
Anion gap: 12 (ref 5–15)
Anion gap: 13 (ref 5–15)
Anion gap: 13 (ref 5–15)
Anion gap: 10 (ref 5–15)
BUN: 35 mg/dL — ABNORMAL HIGH (ref 6–20)
BUN: 35 mg/dL — ABNORMAL HIGH (ref 6–20)
BUN: 32 mg/dL — ABNORMAL HIGH (ref 6–20)
BUN: 32 mg/dL — ABNORMAL HIGH (ref 6–20)
BUN: 30 mg/dL — ABNORMAL HIGH (ref 6–20)
CO2: 17 mmol/L — ABNORMAL LOW (ref 22–32)
CO2: 22 mmol/L (ref 22–32)
CO2: 22 mmol/L (ref 22–32)
CO2: 20 mmol/L — ABNORMAL LOW (ref 22–32)
CO2: 24 mmol/L (ref 22–32)
Calcium: 9.7 mg/dL (ref 8.9–10.3)
Calcium: 9.4 mg/dL (ref 8.9–10.3)
Calcium: 9.1 mg/dL (ref 8.9–10.3)
Calcium: 9.4 mg/dL (ref 8.9–10.3)
Calcium: 9.1 mg/dL (ref 8.9–10.3)
Chloride: 100 mmol/L (ref 98–111)
Chloride: 105 mmol/L (ref 98–111)
Chloride: 103 mmol/L (ref 98–111)
Chloride: 107 mmol/L (ref 98–111)
Chloride: 107 mmol/L (ref 98–111)
Creatinine, Ser: 1.14 mg/dL (ref 0.61–1.24)
Creatinine, Ser: 0.91 mg/dL (ref 0.61–1.24)
Creatinine, Ser: 0.9 mg/dL (ref 0.61–1.24)
Creatinine, Ser: 0.84 mg/dL (ref 0.61–1.24)
Creatinine, Ser: 0.79 mg/dL (ref 0.61–1.24)
GFR, Estimated: >60 mL/min (ref >60)
GFR, Estimated: >60 mL/min (ref >60)
GFR, Estimated: >60 mL/min (ref >60)
GFR, Estimated: >60 mL/min (ref >60)
GFR, Estimated: >60 mL/min (ref >60)
Glucose, Bld: 402 mg/dL — ABNORMAL HIGH (ref 70–99)
Glucose, Bld: 188 mg/dL — ABNORMAL HIGH (ref 70–99)
Glucose, Bld: 214 mg/dL — ABNORMAL HIGH (ref 70–99)
Glucose, Bld: 183 mg/dL — ABNORMAL HIGH (ref 70–99)
Glucose, Bld: 178 mg/dL — ABNORMAL HIGH (ref 70–99)
Potassium: 4.2 mmol/L (ref 3.5–5.1)
Potassium: 3.9 mmol/L (ref 3.5–5.1)
Potassium: 3.6 mmol/L (ref 3.5–5.1)
Potassium: 3.6 mmol/L (ref 3.5–5.1)
Potassium: 3.7 mmol/L (ref 3.5–5.1)
Sodium: 138 mmol/L (ref 135–145)
Sodium: 139 mmol/L (ref 135–145)
Sodium: 138 mmol/L (ref 135–145)
Sodium: 140 mmol/L (ref 135–145)
Sodium: 141 mmol/L (ref 135–145)

## 2023-09-20 LAB — BLOOD GAS, VENOUS
Acid-base deficit: 5.7 mmol/L — ABNORMAL HIGH (ref 0.0–2.0)
Bicarbonate: 19.4 mmol/L — ABNORMAL LOW (ref 20.0–28.0)
O2 Saturation: 68.2 %
Patient temperature: 37
pCO2, Ven: 36 mm[Hg] — ABNORMAL LOW (ref 44–60)
pH, Ven: 7.34 (ref 7.25–7.43)
pO2, Ven: 40 mm[Hg] (ref 32–45)

## 2023-09-20 LAB — URINALYSIS, ROUTINE W REFLEX MICROSCOPIC
Bacteria, UA: NONE SEEN
Bilirubin Urine: NEGATIVE
Glucose, UA: >=500 mg/dL — AB
Hgb urine dipstick: NEGATIVE
Ketones, ur: 80 mg/dL — AB
Leukocytes,Ua: NEGATIVE
Nitrite: NEGATIVE
Protein, ur: 100 mg/dL — AB
Specific Gravity, Urine: 1.03 (ref 1.005–1.030)
pH: 5 (ref 5.0–8.0)

## 2023-09-20 LAB — GLUCOSE, CAPILLARY
Glucose-Capillary: 180 mg/dL — ABNORMAL HIGH (ref 70–99)
Glucose-Capillary: 177 mg/dL — ABNORMAL HIGH (ref 70–99)
Glucose-Capillary: 217 mg/dL — ABNORMAL HIGH (ref 70–99)
Glucose-Capillary: 162 mg/dL — ABNORMAL HIGH (ref 70–99)
Glucose-Capillary: 189 mg/dL — ABNORMAL HIGH (ref 70–99)
Glucose-Capillary: 166 mg/dL — ABNORMAL HIGH (ref 70–99)
Glucose-Capillary: 173 mg/dL — ABNORMAL HIGH (ref 70–99)
Glucose-Capillary: 182 mg/dL — ABNORMAL HIGH (ref 70–99)

## 2023-09-20 LAB — CBC WITH DIFFERENTIAL/PLATELET
Abs Immature Granulocytes: 0.03 10*3/uL (ref 0.00–0.07)
Basophils Absolute: 0 10*3/uL (ref 0.0–0.1)
Basophils Relative: 0 %
Eosinophils Absolute: 0 10*3/uL (ref 0.0–0.5)
Eosinophils Relative: 0 %
HCT: 43.3 % (ref 39.0–52.0)
Hemoglobin: 14.4 g/dL (ref 13.0–17.0)
Immature Granulocytes: 0 %
Lymphocytes Relative: 8 %
Lymphs Abs: 0.7 10*3/uL (ref 0.7–4.0)
MCH: 28.3 pg (ref 26.0–34.0)
MCHC: 33.3 g/dL (ref 30.0–36.0)
MCV: 85.1 fL (ref 80.0–100.0)
Monocytes Absolute: 0.5 10*3/uL (ref 0.1–1.0)
Monocytes Relative: 6 %
Neutro Abs: 7.7 10*3/uL (ref 1.7–7.7)
Neutrophils Relative %: 86 %
Platelets: 271 10*3/uL (ref 150–400)
RBC: 5.09 MIL/uL (ref 4.22–5.81)
RDW: 12.6 % (ref 11.5–15.5)
WBC: 9 10*3/uL (ref 4.0–10.5)
nRBC: 0 % (ref 0.0–0.2)

## 2023-09-20 LAB — I-STAT CHEM 8, ED
BUN: 33 mg/dL — ABNORMAL HIGH (ref 6–20)
Calcium, Ion: 1.16 mmol/L (ref 1.15–1.40)
Chloride: 101 mmol/L (ref 98–111)
Creatinine, Ser: 1 mg/dL (ref 0.61–1.24)
Glucose, Bld: 410 mg/dL — ABNORMAL HIGH (ref 70–99)
HCT: 47 % (ref 39.0–52.0)
Hemoglobin: 16 g/dL (ref 13.0–17.0)
Potassium: 4.2 mmol/L (ref 3.5–5.1)
Sodium: 137 mmol/L (ref 135–145)
TCO2: 17 mmol/L — ABNORMAL LOW (ref 22–32)

## 2023-09-20 LAB — CBG MONITORING, ED
Glucose-Capillary: 377 mg/dL — ABNORMAL HIGH (ref 70–99)
Glucose-Capillary: 341 mg/dL — ABNORMAL HIGH (ref 70–99)
Glucose-Capillary: 268 mg/dL — ABNORMAL HIGH (ref 70–99)
Glucose-Capillary: 211 mg/dL — ABNORMAL HIGH (ref 70–99)
Glucose-Capillary: 157 mg/dL — ABNORMAL HIGH (ref 70–99)
Glucose-Capillary: 150 mg/dL — ABNORMAL HIGH (ref 70–99)
Glucose-Capillary: 164 mg/dL — ABNORMAL HIGH (ref 70–99)
Glucose-Capillary: 193 mg/dL — ABNORMAL HIGH (ref 70–99)
Glucose-Capillary: 170 mg/dL — ABNORMAL HIGH (ref 70–99)
Glucose-Capillary: 165 mg/dL — ABNORMAL HIGH (ref 70–99)

## 2023-09-20 LAB — TROPONIN I (HIGH SENSITIVITY)
Troponin I (High Sensitivity): 3 ng/L (ref <18)
Troponin I (High Sensitivity): 3 ng/L (ref <18)

## 2023-09-20 LAB — BETA-HYDROXYBUTYRIC ACID
Beta-Hydroxybutyric Acid: 6.8 mmol/L — ABNORMAL HIGH (ref 0.05–0.27)
Beta-Hydroxybutyric Acid: 2.36 mmol/L — ABNORMAL HIGH (ref 0.05–0.27)
Beta-Hydroxybutyric Acid: 0.96 mmol/L — ABNORMAL HIGH (ref 0.05–0.27)

## 2023-09-20 LAB — HIV ANTIBODY (ROUTINE TESTING W REFLEX): HIV Screen 4th Generation wRfx: NONREACTIVE

## 2023-09-20 MED ORDER — SODIUM CHLORIDE 0.9 % IV BOLUS: 1000.0000 mL | Freq: Once | INTRAVENOUS | Status: DC

## 2023-09-20 MED ORDER — ONDANSETRON HCL 4 MG/2ML IJ SOLN
4.0000 mg | Freq: Four times a day (QID) | INTRAMUSCULAR | Status: DC | PRN
  Administered 2023-09-20 – 2023-09-23 (×7): 4 mg via INTRAVENOUS
  Filled 2023-09-20 (×7): qty 2

## 2023-09-20 MED ORDER — IOHEXOL 300 MG/ML  SOLN: 30.0000 mL | Freq: Once | INTRAMUSCULAR | Status: DC | PRN

## 2023-09-20 MED ORDER — LACTATED RINGERS IV SOLN: INTRAVENOUS | Status: AC

## 2023-09-20 MED ORDER — DEXTROSE IN LACTATED RINGERS 5 % IV SOLN: INTRAVENOUS | Status: AC

## 2023-09-20 MED ORDER — PANTOPRAZOLE SODIUM 40 MG IV SOLR
40.0000 mg | INTRAVENOUS | Status: DC
  Administered 2023-09-20 – 2023-09-22 (×3): 40 mg via INTRAVENOUS
  Filled 2023-09-20 (×3): qty 10

## 2023-09-20 MED ORDER — CHLORHEXIDINE GLUCONATE CLOTH 2 % EX PADS
6.0000 | MEDICATED_PAD | Freq: Every day | CUTANEOUS | Status: DC
  Administered 2023-09-20 – 2023-09-24 (×3): 6 via TOPICAL

## 2023-09-20 MED ORDER — DEXTROSE 50 % IV SOLN: 0.0000 mL | INTRAVENOUS | Status: DC | PRN

## 2023-09-20 MED ORDER — ACETAMINOPHEN 650 MG RE SUPP: 650.0000 mg | Freq: Four times a day (QID) | RECTAL | Status: DC | PRN

## 2023-09-20 MED ORDER — ALBUTEROL SULFATE (2.5 MG/3ML) 0.083% IN NEBU: 2.5000 mg | INHALATION_SOLUTION | RESPIRATORY_TRACT | Status: DC | PRN

## 2023-09-20 MED ORDER — ONDANSETRON HCL 4 MG PO TABS: 4.0000 mg | ORAL_TABLET | Freq: Four times a day (QID) | ORAL | Status: DC | PRN

## 2023-09-20 MED ORDER — FENTANYL CITRATE PF 50 MCG/ML IJ SOSY
50.0000 ug | PREFILLED_SYRINGE | Freq: Once | INTRAMUSCULAR | Status: AC
Start: 2023-09-20 — End: 2023-09-20
  Administered 2023-09-20: 50 ug via INTRAVENOUS
  Filled 2023-09-20: qty 1

## 2023-09-20 MED ORDER — ACETAMINOPHEN 325 MG PO TABS
650.0000 mg | ORAL_TABLET | Freq: Four times a day (QID) | ORAL | Status: DC | PRN
  Administered 2023-09-25: 650 mg via ORAL
  Filled 2023-09-20: qty 2

## 2023-09-20 MED ORDER — TRAZODONE HCL 50 MG PO TABS: 25.0000 mg | ORAL_TABLET | Freq: Every evening | ORAL | Status: DC | PRN

## 2023-09-20 MED ORDER — LACTATED RINGERS IV BOLUS
20.0000 mL/kg | Freq: Once | INTRAVENOUS | Status: AC
  Administered 2023-09-20: 1360 mL via INTRAVENOUS

## 2023-09-20 MED ORDER — ENOXAPARIN SODIUM 40 MG/0.4ML IJ SOSY
40.0000 mg | PREFILLED_SYRINGE | INTRAMUSCULAR | Status: DC
  Administered 2023-09-20 – 2023-09-23 (×4): 40 mg via SUBCUTANEOUS
  Filled 2023-09-20 (×4): qty 0.4

## 2023-09-20 MED ORDER — SODIUM CHLORIDE 0.9 % IV SOLN
12.5000 mg | Freq: Four times a day (QID) | INTRAVENOUS | Status: DC | PRN
  Administered 2023-09-20 – 2023-09-22 (×4): 12.5 mg via INTRAVENOUS
  Filled 2023-09-20 (×4): qty 0.5

## 2023-09-20 MED ORDER — INSULIN REGULAR(HUMAN) IN NACL 100-0.9 UT/100ML-% IV SOLN
INTRAVENOUS | Status: DC
  Administered 2023-09-20: 10 [IU]/h via INTRAVENOUS
  Administered 2023-09-21: 2.4 [IU]/h via INTRAVENOUS
  Filled 2023-09-20 (×2): qty 100

## 2023-09-20 MED ORDER — ORAL CARE MOUTH RINSE: 15.0000 mL | OROMUCOSAL | Status: DC | PRN

## 2023-09-20 MED ORDER — METOCLOPRAMIDE HCL 5 MG/ML IJ SOLN
10.0000 mg | Freq: Four times a day (QID) | INTRAMUSCULAR | Status: DC | PRN
  Administered 2023-09-20 – 2023-09-21 (×4): 10 mg via INTRAVENOUS
  Filled 2023-09-20 (×4): qty 2

## 2023-09-20 MED ORDER — POTASSIUM CHLORIDE 10 MEQ/100ML IV SOLN
10.0000 meq | INTRAVENOUS | Status: AC
  Administered 2023-09-20 (×2): 10 meq via INTRAVENOUS
  Filled 2023-09-20 (×2): qty 100

## 2023-09-20 MED ORDER — ONDANSETRON HCL 4 MG/2ML IJ SOLN
4.0000 mg | Freq: Once | INTRAMUSCULAR | Status: AC
  Administered 2023-09-20: 4 mg via INTRAVENOUS
  Filled 2023-09-20: qty 2

## 2023-09-20 MED ORDER — ESCITALOPRAM OXALATE 20 MG PO TABS
20.0000 mg | ORAL_TABLET | Freq: Every day | ORAL | Status: DC
  Administered 2023-09-21 – 2023-09-24 (×4): 20 mg via ORAL
  Filled 2023-09-20 (×4): qty 1

## 2023-09-20 MED ORDER — SODIUM CHLORIDE 0.9% FLUSH
3.0000 mL | INTRAVENOUS | Status: DC | PRN
  Administered 2023-09-20: 3 mL via INTRAVENOUS

## 2023-09-20 NOTE — ED Notes (Signed)
ED TO INPATIENT HANDOFF REPORT  Name/Age/Gender Edward Walls 42 y.o. male  Code Status    Code Status Orders  (From admission, onward)           Start     Ordered   09/20/23 0804  Full code  Continuous       Question:  By:  Answer:  Consent: discussion documented in EHR   09/20/23 0803           Code Status History     Date Active Date Inactive Code Status Order ID Comments User Context   05/22/2023 1527 05/26/2023 1855 Full Code [id number]  Maryln Gottron, MD ED   01/27/2023 0745 01/29/2023 1713 Full Code [id number]  Maryln Gottron, MD ED   10/25/2022 1600 10/28/2022 2252 Full Code [id number]  Bobette Mo, MD ED   09/28/2022 0359 09/30/2022 0021 Full Code [id number]  Darlin Drop, DO ED   05/24/2022 1355 05/26/2022 2106 Full Code [id number]  Janeann Forehand D, NP ED   06/04/2020 1123 06/07/2020 2051 Full Code [id number]  Almon Hercules, MD ED   10/14/2018 1251 10/17/2018 1929 Full Code [id number]  [name], NP ED   03/14/2018 0953 03/15/2018 2210 Full Code [id number]  Narda Bonds, MD Inpatient   03/14/2018 0800 03/14/2018 0953 Full Code [id number]  Narda Bonds, MD ED   07/03/2017 1959 07/06/2017 1351 Full Code [id number]  Lorretta Harp, MD ED   10/20/2016 1712 10/22/2016 2001 Full Code [id number]  Jerald Kief, MD ED       Home/SNF/Other Home  Chief Complaint DKA, type 1 (HCC) [E10.10]  Level of Care/Admitting Diagnosis ED Disposition     ED Disposition  Admit   Condition  --   Comment  Hospital Area: [facility] [[id number]]  Level of Care: Stepdown [14]  Admit to SDU based on following criteria: Severe physiological/psychological symptoms:  Any diagnosis requiring assessment & intervention at least every 4 hours on an ongoing basis to obtain desired patient outcomes including stability and rehabilitation  May place patient in observation at [facility] or [facility] if equivalent level of care is available:: Yes  Covid Evaluation:  Asymptomatic - no recent exposure (last 10 days) testing not required  Diagnosis: DKA, type 1 [facility]) [[id number]]  Admitting Physician: Maryln Gottron [[id number]]  Attending Physician: Olexa.Dam, MIR Jaxson.Roy [[id number]]          Medical History Past Medical History:  Diagnosis Date   Diabetes mellitus without complication (HCC)    Leg pain    Leg weakness    Seasonal allergies     Allergies Allergies  Allergen Reactions   Other Other (See Comments)    All nuts Throat pain   Peanut-Containing Drug Products Other (See Comments)    Throat pain    IV Location/Drains/Wounds Patient Lines/Drains/Airways Status     Active Line/Drains/Airways     Name Placement date Placement time Site Days   Peripheral IV 09/20/23 20 G Posterior;Proximal;Right Forearm 09/20/23  0407  Forearm  less than 1   Peripheral IV 09/20/23 18 G Left Antecubital 09/20/23  0408  Antecubital  less than 1   Wound / Incision (Open or Dehisced) 01/27/23 Burn;Other (Comment) Ischial tuberosity Right 01/27/23  1500  Ischial tuberosity  236            Labs/Imaging Results for orders placed or performed during the hospital encounter of 09/20/23 (from the past 48 hour(s))  POC CBG, ED  Status: Abnormal   Collection Time: 09/20/23  4:04 AM  Result Value Ref Range   Glucose-Capillary 377 (H) 70 - 99 mg/dL    Comment: Glucose reference range applies only to samples taken after fasting for at least 8 hours.  Basic metabolic panel     Status: Abnormal   Collection Time: 09/20/23  4:10 AM  Result Value Ref Range   Sodium 138 135 - 145 mmol/L   Potassium 4.2 3.5 - 5.1 mmol/L   Chloride 100 98 - 111 mmol/L   CO2 17 (L) 22 - 32 mmol/L   Glucose, Bld 402 (H) 70 - 99 mg/dL    Comment: Glucose reference range applies only to samples taken after fasting for at least 8 hours.   BUN 35 (H) 6 - 20 mg/dL   Creatinine, Ser 1.61 0.61 - 1.24 mg/dL   Calcium 9.7 8.9 - 09.6 mg/dL   GFR, Estimated >04 >54 mL/min    Comment:  (NOTE) Calculated using the CKD-EPI Creatinine Equation (2021)    Anion gap 21 (H) 5 - 15    Comment: ELECTROLYTES REPEATED TO VERIFY Performed at [facility], [address], [place], [place] [address]   Beta-hydroxybutyric acid     Status: Abnormal   Collection Time: 09/20/23  4:10 AM  Result Value Ref Range   Beta-Hydroxybutyric Acid 6.80 (H) 0.05 - 0.27 mmol/L    Comment: RESULT CONFIRMED BY MANUAL DILUTION Performed at [facility], [address] [place], [place] [address]   CBC with Differential (PNL)     Status: None   Collection Time: 09/20/23  4:10 AM  Result Value Ref Range   WBC 9.0 4.0 - 10.5 K/uL   RBC 5.09 4.22 - 5.81 MIL/uL   Hemoglobin 14.4 13.0 - 17.0 g/dL   HCT 29.5 62.1 - 30.8 %   MCV 85.1 80.0 - 100.0 fL   MCH 28.3 26.0 - 34.0 pg   MCHC 33.3 30.0 - 36.0 g/dL   RDW 65.7 84.6 - 96.2 %   Platelets 271 150 - 400 K/uL   nRBC 0.0 0.0 - 0.2 %   Neutrophils Relative % 86 %   Neutro Abs 7.7 1.7 - 7.7 K/uL   Lymphocytes Relative 8 %   Lymphs Abs 0.7 0.7 - 4.0 K/uL   Monocytes Relative 6 %   Monocytes Absolute 0.5 0.1 - 1.0 K/uL   Eosinophils Relative 0 %   Eosinophils Absolute 0.0 0.0 - 0.5 K/uL   Basophils Relative 0 %   Basophils Absolute 0.0 0.0 - 0.1 K/uL   Immature Granulocytes 0 %   Abs Immature Granulocytes 0.03 0.00 - 0.07 K/uL    Comment: Performed at [facility], [address] [place], [place] [address]  Blood gas, venous     Status: Abnormal   Collection Time: 09/20/23  4:10 AM  Result Value Ref Range   pH, Ven 7.34 7.25 - 7.43   pCO2, Ven 36 (L) 44 - 60 mmHg   pO2, Ven 40 32 - 45 mmHg   Bicarbonate 19.4 (L) 20.0 - 28.0 mmol/L   Acid-base deficit 5.7 (H) 0.0 - 2.0 mmol/L   O2 Saturation 68.2 %   Patient temperature 37.0     Comment: Performed at [facility], [address], [place], [place] [address]  Troponin I (High Sensitivity)     Status: None   Collection  Time: 09/20/23  4:10 AM  Result Value Ref Range   Troponin  I (High Sensitivity) 3 <18 ng/L    Comment: (NOTE) Elevated high sensitivity troponin I (hsTnI) values and significant  changes across serial measurements may suggest ACS but many other  chronic and acute conditions are known to elevate hsTnI results.  Refer to the "Links" section for chest pain algorithms and additional  guidance. Performed at [facility], [address], [place], [place] [address]   I-stat chem 8, ED (not at [facility], [facility] or [facility])     Status: Abnormal   Collection Time: 09/20/23  4:17 AM  Result Value Ref Range   Sodium 137 135 - 145 mmol/L   Potassium 4.2 3.5 - 5.1 mmol/L   Chloride 101 98 - 111 mmol/L   BUN 33 (H) 6 - 20 mg/dL   Creatinine, Ser 6.04 0.61 - 1.24 mg/dL   Glucose, Bld 540 (H) 70 - 99 mg/dL    Comment: Glucose reference range applies only to samples taken after fasting for at least 8 hours.   Calcium, Ion 1.16 1.15 - 1.40 mmol/L   TCO2 17 (L) 22 - 32 mmol/L   Hemoglobin 16.0 13.0 - 17.0 g/dL   HCT 98.1 19.1 - 47.8 %  Urinalysis, Routine w reflex microscopic -Urine, Clean Catch     Status: Abnormal   Collection Time: 09/20/23  4:48 AM  Result Value Ref Range   Color, Urine YELLOW YELLOW   APPearance CLEAR CLEAR   Specific Gravity, Urine 1.030 1.005 - 1.030   pH 5.0 5.0 - 8.0   Glucose, UA >=500 (A) NEGATIVE mg/dL   Hgb urine dipstick NEGATIVE NEGATIVE   Bilirubin Urine NEGATIVE NEGATIVE   Ketones, ur 80 (A) NEGATIVE mg/dL   Protein, ur 295 (A) NEGATIVE mg/dL   Nitrite NEGATIVE NEGATIVE   Leukocytes,Ua NEGATIVE NEGATIVE   RBC / HPF 0-5 0 - 5 RBC/hpf   WBC, UA 0-5 0 - 5 WBC/hpf   Bacteria, UA NONE SEEN NONE SEEN   Squamous Epithelial / HPF 0-5 0 - 5 /HPF   Mucus PRESENT    Hyaline Casts, UA PRESENT     Comment: Performed at [facility], [address] [place], [place] [address]  CBG monitoring, ED     Status: Abnormal   Collection Time: 09/20/23   5:28 AM  Result Value Ref Range   Glucose-Capillary 341 (H) 70 - 99 mg/dL    Comment: Glucose reference range applies only to samples taken after fasting for at least 8 hours.  POC CBG, ED     Status: Abnormal   Collection Time: 09/20/23  6:40 AM  Result Value Ref Range   Glucose-Capillary 268 (H) 70 - 99 mg/dL    Comment: Glucose reference range applies only to samples taken after fasting for at least 8 hours.  CBG monitoring, ED     Status: Abnormal   Collection Time: 09/20/23  7:37 AM  Result Value Ref Range   Glucose-Capillary 211 (H) 70 - 99 mg/dL    Comment: Glucose reference range applies only to samples taken after fasting for at least 8 hours.  Basic metabolic panel     Status: Abnormal   Collection Time: 09/20/23  7:59 AM  Result Value Ref Range   Sodium 139 135 - 145 mmol/L   Potassium 3.9 3.5 - 5.1 mmol/L   Chloride 105 98 - 111 mmol/L   CO2 22 22 - 32 mmol/L   Glucose, Bld 188 (H) 70 - 99 mg/dL    Comment: Glucose reference range  applies only to samples taken after fasting for at least 8 hours.   BUN 35 (H) 6 - 20 mg/dL   Creatinine, Ser 0.86 0.61 - 1.24 mg/dL   Calcium 9.4 8.9 - 57.8 mg/dL   GFR, Estimated >46 >96 mL/min    Comment: (NOTE) Calculated using the CKD-EPI Creatinine Equation (2021)    Anion gap 12 5 - 15    Comment: Performed at [facility], [address], [place], [place] [address]  Troponin I (High Sensitivity)     Status: None   Collection Time: 09/20/23  7:59 AM  Result Value Ref Range   Troponin I (High Sensitivity) 3 <18 ng/L    Comment: (NOTE) Elevated high sensitivity troponin I (hsTnI) values and significant  changes across serial measurements may suggest ACS but many other  chronic and acute conditions are known to elevate hsTnI results.  Refer to the "Links" section for chest pain algorithms and additional  guidance. Performed at [facility], [address], [place], [place] [address]   CBG  monitoring, ED     Status: Abnormal   Collection Time: 09/20/23  8:46 AM  Result Value Ref Range   Glucose-Capillary 157 (H) 70 - 99 mg/dL    Comment: Glucose reference range applies only to samples taken after fasting for at least 8 hours.  CBG monitoring, ED     Status: Abnormal   Collection Time: 09/20/23  9:48 AM  Result Value Ref Range   Glucose-Capillary 150 (H) 70 - 99 mg/dL    Comment: Glucose reference range applies only to samples taken after fasting for at least 8 hours.  CBG monitoring, ED     Status: Abnormal   Collection Time: 09/20/23 10:58 AM  Result Value Ref Range   Glucose-Capillary 164 (H) 70 - 99 mg/dL    Comment: Glucose reference range applies only to samples taken after fasting for at least 8 hours.  CBG monitoring, ED     Status: Abnormal   Collection Time: 09/20/23 11:51 AM  Result Value Ref Range   Glucose-Capillary 193 (H) 70 - 99 mg/dL    Comment: Glucose reference range applies only to samples taken after fasting for at least 8 hours.  Basic metabolic panel     Status: Abnormal   Collection Time: 09/20/23 12:18 PM  Result Value Ref Range   Sodium 138 135 - 145 mmol/L   Potassium 3.6 3.5 - 5.1 mmol/L   Chloride 103 98 - 111 mmol/L   CO2 22 22 - 32 mmol/L   Glucose, Bld 214 (H) 70 - 99 mg/dL    Comment: Glucose reference range applies only to samples taken after fasting for at least 8 hours.   BUN 32 (H) 6 - 20 mg/dL   Creatinine, Ser 4.01 0.61 - 1.24 mg/dL   Calcium 9.1 8.9 - 02.7 mg/dL   GFR, Estimated >25 >36 mL/min    Comment: (NOTE) Calculated using the CKD-EPI Creatinine Equation (2021)    Anion gap 13 5 - 15    Comment: Performed at [facility], [address] [place], [place] [address]  Beta-hydroxybutyric acid     Status: Abnormal   Collection Time: 09/20/23 12:18 PM  Result Value Ref Range   Beta-Hydroxybutyric Acid 2.36 (H) 0.05 - 0.27 mmol/L    Comment: Performed at [facility], [address], [place], [place] [address]  CBG monitoring, ED     Status: Abnormal   Collection Time:  09/20/23  1:35 PM  Result Value Ref Range   Glucose-Capillary 170 (H) 70 - 99 mg/dL    Comment: Glucose reference range applies only to samples taken after fasting for at least 8 hours.  CBG monitoring, ED     Status: Abnormal   Collection Time: 09/20/23  2:37 PM  Result Value Ref Range   Glucose-Capillary 165 (H) 70 - 99 mg/dL    Comment: Glucose reference range applies only to samples taken after fasting for at least 8 hours.   DG Chest 2 View  Result Date: 09/20/2023 CLINICAL DATA:  Chest pain and shortness of breath. EXAM: CHEST - 2 VIEW COMPARISON:  06/19/2023 FINDINGS: The lungs are clear without focal pneumonia, edema, pneumothorax or pleural effusion. The cardiopericardial silhouette is within normal limits for size. No acute bony abnormality. IMPRESSION: No active cardiopulmonary disease. Electronically Signed   By: Kennith Center M.D.   On: 09/20/2023 05:53    Pending Labs Unresulted Labs (From admission, onward)     Start     Ordered   09/21/23 0500  CBC  Tomorrow morning,   R        09/20/23 0803   09/20/23 0803  HIV Antibody (routine testing w rflx)  (HIV Antibody (Routine testing w reflex) panel)  Once,   R        09/20/23 0803   09/20/23 0406  Basic metabolic panel  (Diabetes Ketoacidosis (DKA))  STAT Now then every 4 hours ,   STAT      09/20/23 0406   09/20/23 0406  Beta-hydroxybutyric acid  (Diabetes Ketoacidosis (DKA))  Now then every 8 hours,   STAT (with URGENT occurrences)      09/20/23 0406            Vitals/Pain Today's Vitals   09/20/23 0700 09/20/23 0759 09/20/23 1115 09/20/23 1200  BP: 128/81  (!) 141/92 132/77  Pulse: (!) 104  (!) 108 (!) 108  Resp: 13  16 16   Temp:  98.5 F (36.9 C) 98.5 F (36.9 C)   TempSrc:  Oral Oral   SpO2: 100%  100% 97%  Weight:      Height:      PainSc:        Isolation Precautions No active  isolations  Medications Medications  insulin regular, human (MYXREDLIN) 100 units/ 100 mL infusion (2.6 Units/hr Intravenous Rate/Dose Change 09/20/23 1400)  dextrose 5 % in lactated ringers infusion ( Intravenous New Bag/Given 09/20/23 0758)  dextrose 50 % solution 0-50 mL (has no administration in time range)  lactated ringers infusion (0 mLs Intravenous Stopped 09/20/23 0751)  escitalopram (LEXAPRO) tablet 20 mg (0 mg Oral Hold 09/20/23 1214)  pantoprazole (PROTONIX) injection 40 mg (40 mg Intravenous Given 09/20/23 0854)  enoxaparin (LOVENOX) injection 40 mg (40 mg Subcutaneous Given 09/20/23 1213)  acetaminophen (TYLENOL) tablet 650 mg (has no administration in time range)    Or  acetaminophen (TYLENOL) suppository 650 mg (has no administration in time range)  traZODone (DESYREL) tablet 25 mg (has no administration in time range)  ondansetron (ZOFRAN) tablet 4 mg ( Oral See Alternative 09/20/23 0856)    Or  ondansetron (ZOFRAN) injection 4 mg (4 mg Intravenous Given 09/20/23 0856)  albuterol (PROVENTIL) (2.5 MG/3ML) 0.083% nebulizer solution 2.5 mg (has no administration in time range)  metoCLOPramide (REGLAN) injection 10 mg (10 mg Intravenous Given 09/20/23 1210)  lactated ringers bolus 1,360 mL (0 mLs Intravenous Stopped 09/20/23 0539)  ondansetron (ZOFRAN) injection 4 mg (4 mg  Intravenous Given 09/20/23 0439)  fentaNYL (SUBLIMAZE) injection 50 mcg (50 mcg Intravenous Given 09/20/23 0439)  potassium chloride 10 mEq in 100 mL IVPB (0 mEq Intravenous Stopped 09/20/23 0744)    Mobility walks

## 2023-09-20 NOTE — H&P (Signed)
History and Physical  Edward Walls MWN:[id number] DOB: Mar 07, 1981 DOA: 09/20/2023  PCP: Mliss Sax, MD   Chief Complaint: Nausea vomiting  HPI: Edward Walls is a 42 y.o. male with medical history significant for insulin-dependent type 2 diabetes, chronic intermittent nausea and vomiting as well as marijuana use being admitted to the hospital with 24 hours of vomiting and found to have DKA.  States that he was in his usual state of health until little over 24 hours ago, states that he has been compliant with his insulin.  He describes taking Lantus 12 units 3 times daily with meals, as well as 34 units of Humalog every morning.  Says that he last took insulin a couple hours before coming to the emergency department.  Denies any chest pain, hematemesis, diarrhea, or significant abdominal pain, just nausea and intractable vomiting.  Currently he is resting comfortably, denies any significant nausea.  Review of Systems: Please see HPI for pertinent positives and negatives. A complete 10 system review of systems are otherwise negative.  Past Medical History:  Diagnosis Date   Diabetes mellitus without complication (HCC)    Leg pain    Leg weakness    Seasonal allergies    Past Surgical History:  Procedure Laterality Date   DENTAL SURGERY     ESOPHAGOGASTRODUODENOSCOPY (EGD) WITH PROPOFOL N/A 10/27/2022   Procedure: ESOPHAGOGASTRODUODENOSCOPY (EGD) WITH PROPOFOL;  Surgeon: Sherrilyn Rist, MD;  Location: [facility];  Service: Gastroenterology;  Laterality: N/A;    Social History:  reports that he has quit smoking. His smoking use included cigarettes. He has never used smokeless tobacco. He reports current alcohol use. He reports that he does not use drugs.   Allergies  Allergen Reactions   Other Other (See Comments)    All nuts Throat pain   Peanut-Containing Drug Products Other (See Comments)    Throat pain    Family History  Problem Relation Age of Onset    Diabetes Mellitus II Mother    Diabetes Mellitus II Father    Diabetes Mellitus II Brother      Prior to Admission medications   Medication Sig Start Date End Date Taking? Authorizing Provider  acetaminophen (TYLENOL) 325 MG tablet Take 2 tablets (650 mg total) by mouth every 6 (six) hours as needed for mild pain (or Fever >/= 101). 05/26/23   Marguerita Merles Latif, DO  Continuous Glucose Sensor (DEXCOM G6 SENSOR) MISC Replace every 10 days 03/02/23   Carlus Pavlov, MD  Continuous Glucose Transmitter (DEXCOM G6 TRANSMITTER) MISC 1 Device by Does not apply route every 3 (three) months. 03/02/23   Carlus Pavlov, MD  escitalopram (LEXAPRO) 10 MG tablet Take 2 tablets (20 mg total) by mouth daily. 07/26/23 01/22/24  Mliss Sax, MD  escitalopram (LEXAPRO) 20 MG tablet TAKE 1 TABLET (20 MG TOTAL) BY MOUTH DAILY. 06/25/23   Mliss Sax, MD  feeding supplement, GLUCERNA SHAKE, (GLUCERNA SHAKE) LIQD Take 237 mLs by mouth 3 (three) times daily between meals. 05/26/23   Marguerita Merles Latif, DO  gabapentin (NEURONTIN) 300 MG capsule Take 2 capsules (600 mg total) by mouth 3 (three) times daily. 06/27/22   Levert Feinstein, MD  insulin glargine (LANTUS) 100 UNIT/ML Solostar Pen Inject 30 Units into the skin daily. 08/24/23 11/22/23  Carlus Pavlov, MD  Insulin Lispro-aabc (LYUMJEV KWIKPEN) 100 UNIT/ML KwikPen Inject 8-12 Units into the skin in the morning, at noon, in the evening, and at bedtime. 06/07/23   Carlus Pavlov, MD  Insulin Pen Needle 32G X 4 MM MISC Use 4-5x a day 05/26/22   Marguerita Merles Latif, DO  lidocaine (LIDODERM) 5 % Place 1 patch onto the skin daily. Remove & Discard patch within 12 hours or as directed by MD 01/30/23   [name], [name], DO  metoCLOPramide (REGLAN) 10 MG tablet Take 1 tablet (10 mg total) by mouth 3 (three) times daily with meals for 7 days, THEN 1 tablet (10 mg total) every 8 (eight) hours as needed for up to 7 days for nausea. 05/26/23 06/09/23  Marguerita Merles  Latif, DO  Multiple Vitamin (MULTIVITAMIN WITH MINERALS) TABS tablet Take 1 tablet by mouth daily. 05/27/23   Marguerita Merles Latif, DO  ondansetron (ZOFRAN) 4 MG tablet Take 1 tablet (4 mg total) by mouth every 6 (six) hours as needed for nausea or vomiting. 12/29/22   [name], MD  pantoprazole (PROTONIX) 40 MG tablet Take 40 mg by mouth 2 (two) times daily. 04/04/23   [provider]  XIFAXAN 550 MG TABS tablet Take 550 mg by mouth 3 (three) times daily. Patient not taking: Reported on 07/26/2023 05/02/23   [provider]    Physical Exam: BP 128/81   Pulse (!) 104   Temp 98.5 F (36.9 C) (Oral)   Resp 13   Ht 6\' 3"  (1.905 m)   Wt 68 kg   SpO2 100%   BMI 18.75 kg/m   General:  Alert, oriented, calm, in no acute distress, napping on my arrival but woken up easily some voluntary guarding Eyes: EOMI, clear conjuctivae, white sclerea Neck: supple, no masses, trachea mildline  Cardiovascular: RRR, no murmurs or rubs, no peripheral edema  Respiratory: clear to auscultation bilaterally, no wheezes, no crackles  Abdomen: soft, nontender, nondistended, some voluntary guarding Skin: dry, no rashes  Musculoskeletal: no joint effusions, normal range of motion  Psychiatric: appropriate affect, normal speech  Neurologic: extraocular muscles intact, clear speech, moving all extremities with intact sensorium         Labs on Admission:  Basic Metabolic Panel: Recent Labs  Lab 09/20/23 0410 09/20/23 0417  NA 138 137  K 4.2 4.2  CL 100 101  CO2 17*  --   GLUCOSE 402* 410*  BUN 35* 33*  CREATININE 1.14 1.00  CALCIUM 9.7  --    Liver Function Tests: No results for input(s): "AST", "ALT", "ALKPHOS", "BILITOT", "PROT", "ALBUMIN" in the last 168 hours. No results for input(s): "LIPASE", "AMYLASE" in the last 168 hours. No results for input(s): "AMMONIA" in the last 168 hours. CBC: Recent Labs  Lab 09/20/23 0410 09/20/23 0417  WBC 9.0  --   NEUTROABS 7.7  --    HGB 14.4 16.0  HCT 43.3 47.0  MCV 85.1  --   PLT 271  --    Cardiac Enzymes: No results for input(s): "CKTOTAL", "CKMB", "CKMBINDEX", "TROPONINI" in the last 168 hours.  BNP (last 3 results) No results for input(s): "BNP" in the last 8760 hours.  ProBNP (last 3 results) No results for input(s): "PROBNP" in the last 8760 hours.  CBG: Recent Labs  Lab 09/20/23 0404 09/20/23 0528 09/20/23 0640 09/20/23 0737  GLUCAP 377* 341* 268* 211*    Radiological Exams on Admission: DG Chest 2 View  Result Date: 09/20/2023 CLINICAL DATA:  Chest pain and shortness of breath. EXAM: CHEST - 2 VIEW COMPARISON:  06/19/2023 FINDINGS: The lungs are clear without focal pneumonia, edema, pneumothorax or pleural effusion. The cardiopericardial silhouette is within normal limits for  size. No acute bony abnormality. IMPRESSION: No active cardiopulmonary disease. Electronically Signed   By: Kennith Center M.D.   On: 09/20/2023 05:53    Assessment/Plan Edward Walls is a 42 y.o. male with medical history significant for insulin-dependent type 2 diabetes, small intestinal bacterial overgrowth, chronic intermittent nausea and vomiting as well as marijuana use being admitted to the hospital with 24 hours of vomiting and found to have DKA.   DKA-in the setting of type 1 diabetes, and dehydration from recurrent nausea/vomiting.  I am unsure whether he has been compliant with his insulin regimen since he seems to be little confused about long-acting versus short acting insulin.  However, he may just have not spoken. -Inpatient admission to stepdown -Keep n.p.o. -Continue IV fluids and IV insulin drip per protocol -Monitor every 4 hour BMP -Blood sugar is significantly improving, anticipate we can get him off insulin drip the next few hours  Nausea-IV Zofran as needed  GERD-IV PPI  DVT prophylaxis: Lovenox     Code Status: Full Code  Consults called: None  Admission status: Observation  Time  spent: 49 minutes  Jalena Vanderlinden Sharlette Dense MD [facility] Pager [phone]  If 7PM-7AM, please contact night-coverage [email or web address] Password [email or web address]  09/20/2023, 8:09 AM

## 2023-09-20 NOTE — ED Provider Notes (Signed)
[place] EMERGENCY DEPARTMENT AT [facility] Provider Note   CSN: [id number] Arrival date & time: 09/20/23  0356     History  Chief Complaint  Patient presents with   Emesis    Edward Walls is a 42 y.o. male.  Patient presents to the emergency room complaining of nausea and vomiting which began at midnight 29 hours ago.  He also complains of some left-sided chest pain which is intermittent in nature, 10 out of 10 in severity when it occurs, with shortness of breath.  The patient feels like he may be going into DKA and has a history of the same.  He states he takes insulin but is unclear about how he takes his insulin and seems confused about short acting versus long-acting insulins.  Patient with history of type I DM with ketoacidosis, diabetic neuropathy, gastroparesis, tachycardia   Emesis      Home Medications Prior to Admission medications   Medication Sig Start Date End Date Taking? Authorizing Provider  acetaminophen (TYLENOL) 325 MG tablet Take 2 tablets (650 mg total) by mouth every 6 (six) hours as needed for mild pain (or Fever >/= 101). 05/26/23   Marguerita Merles Latif, DO  Continuous Glucose Sensor (DEXCOM G6 SENSOR) MISC Replace every 10 days 03/02/23   Carlus Pavlov, MD  Continuous Glucose Transmitter (DEXCOM G6 TRANSMITTER) MISC 1 Device by Does not apply route every 3 (three) months. 03/02/23   Carlus Pavlov, MD  escitalopram (LEXAPRO) 10 MG tablet Take 2 tablets (20 mg total) by mouth daily. 07/26/23 01/22/24  Mliss Sax, MD  escitalopram (LEXAPRO) 20 MG tablet TAKE 1 TABLET (20 MG TOTAL) BY MOUTH DAILY. 06/25/23   Mliss Sax, MD  feeding supplement, GLUCERNA SHAKE, (GLUCERNA SHAKE) LIQD Take 237 mLs by mouth 3 (three) times daily between meals. 05/26/23   Marguerita Merles Latif, DO  gabapentin (NEURONTIN) 300 MG capsule Take 2 capsules (600 mg total) by mouth 3 (three) times daily. 06/27/22   Levert Feinstein, MD  insulin glargine  (LANTUS) 100 UNIT/ML Solostar Pen Inject 30 Units into the skin daily. 08/24/23 11/22/23  Carlus Pavlov, MD  Insulin Lispro-aabc (LYUMJEV KWIKPEN) 100 UNIT/ML KwikPen Inject 8-12 Units into the skin in the morning, at noon, in the evening, and at bedtime. 06/07/23   Carlus Pavlov, MD  Insulin Pen Needle 32G X 4 MM MISC Use 4-5x a day 05/26/22   Marguerita Merles Latif, DO  lidocaine (LIDODERM) 5 % Place 1 patch onto the skin daily. Remove & Discard patch within 12 hours or as directed by MD 01/30/23   [name], [name], DO  metoCLOPramide (REGLAN) 10 MG tablet Take 1 tablet (10 mg total) by mouth 3 (three) times daily with meals for 7 days, THEN 1 tablet (10 mg total) every 8 (eight) hours as needed for up to 7 days for nausea. 05/26/23 06/09/23  Marguerita Merles Latif, DO  Multiple Vitamin (MULTIVITAMIN WITH MINERALS) TABS tablet Take 1 tablet by mouth daily. 05/27/23   Marguerita Merles Latif, DO  ondansetron (ZOFRAN) 4 MG tablet Take 1 tablet (4 mg total) by mouth every 6 (six) hours as needed for nausea or vomiting. 12/29/22   [name], MD  pantoprazole (PROTONIX) 40 MG tablet Take 40 mg by mouth 2 (two) times daily. 04/04/23   [provider]  XIFAXAN 550 MG TABS tablet Take 550 mg by mouth 3 (three) times daily. Patient not taking: Reported on 07/26/2023 05/02/23   [provider]  Allergies    Other and Peanut-containing drug products    Review of Systems   Review of Systems  Gastrointestinal:  Positive for vomiting.    Physical Exam Updated Vital Signs BP 139/80   Pulse (!) 115   Temp 98.6 F (37 C) (Oral)   Resp 16   Ht 6\' 3"  (1.905 m)   Wt 68 kg   SpO2 100%   BMI 18.75 kg/m  Physical Exam Vitals and nursing note reviewed.  Constitutional:      General: He is not in acute distress.    Appearance: He is well-developed.  HENT:     Head: Normocephalic and atraumatic.  Eyes:     Conjunctiva/sclera: Conjunctivae normal.  Cardiovascular:     Rate and Rhythm:  Normal rate and regular rhythm.     Heart sounds: No murmur heard. Pulmonary:     Effort: Pulmonary effort is normal. No respiratory distress.     Breath sounds: Normal breath sounds.  Abdominal:     Palpations: Abdomen is soft.     Tenderness: There is no abdominal tenderness.  Musculoskeletal:        General: No swelling.     Cervical back: Neck supple.     Right lower leg: No edema.     Left lower leg: No edema.  Skin:    General: Skin is warm.     Capillary Refill: Capillary refill takes less than 2 seconds.     Comments: Diaphoretic  Neurological:     Mental Status: He is alert.  Psychiatric:        Mood and Affect: Mood normal.     ED Results / Procedures / Treatments   Labs (all labs ordered are listed, but only abnormal results are displayed) Labs Reviewed  BASIC METABOLIC PANEL - Abnormal; Notable for the following components:      Result Value   CO2 17 (*)    Glucose, Bld 402 (*)    BUN 35 (*)    Anion gap 21 (*)    All other components within normal limits  BETA-HYDROXYBUTYRIC ACID - Abnormal; Notable for the following components:   Beta-Hydroxybutyric Acid 6.80 (*)    All other components within normal limits  URINALYSIS, ROUTINE W REFLEX MICROSCOPIC - Abnormal; Notable for the following components:   Glucose, UA >=500 (*)    Ketones, ur 80 (*)    Protein, ur 100 (*)    All other components within normal limits  BLOOD GAS, VENOUS - Abnormal; Notable for the following components:   pCO2, Ven 36 (*)    Bicarbonate 19.4 (*)    Acid-base deficit 5.7 (*)    All other components within normal limits  CBG MONITORING, ED - Abnormal; Notable for the following components:   Glucose-Capillary 377 (*)    All other components within normal limits  CBG MONITORING, ED - Abnormal; Notable for the following components:   Glucose-Capillary 341 (*)    All other components within normal limits  I-STAT CHEM 8, ED - Abnormal; Notable for the following components:   BUN 33  (*)    Glucose, Bld 410 (*)    TCO2 17 (*)    All other components within normal limits  CBG MONITORING, ED - Abnormal; Notable for the following components:   Glucose-Capillary 268 (*)    All other components within normal limits  CBC WITH DIFFERENTIAL/PLATELET  BASIC METABOLIC PANEL  BASIC METABOLIC PANEL  BASIC METABOLIC PANEL  BASIC METABOLIC PANEL  BETA-HYDROXYBUTYRIC  ACID  BETA-HYDROXYBUTYRIC ACID  TROPONIN I (HIGH SENSITIVITY)  TROPONIN I (HIGH SENSITIVITY)    EKG EKG Interpretation Date/Time:  Thursday September 20 2023 04:10:03 EST Ventricular Rate:  115 PR Interval:  64 QRS Duration:  82 QT Interval:  330 QTC Calculation: 457 R Axis:   90  Text Interpretation: Sinus tachycardia Multiform ventricular premature complexes Aberrant complex Probable left atrial enlargement Borderline right axis deviation Probable left ventricular hypertrophy Anterior Q waves, possibly due to LVH Abnormal T, consider ischemia, diffuse leads new T wave inversions Confirmed by Glynn Octave [phone]) on 09/20/2023 4:27:29 AM  Radiology DG Chest 2 View  Result Date: 09/20/2023 CLINICAL DATA:  Chest pain and shortness of breath. EXAM: CHEST - 2 VIEW COMPARISON:  06/19/2023 FINDINGS: The lungs are clear without focal pneumonia, edema, pneumothorax or pleural effusion. The cardiopericardial silhouette is within normal limits for size. No acute bony abnormality. IMPRESSION: No active cardiopulmonary disease. Electronically Signed   By: Kennith Center M.D.   On: 09/20/2023 05:53    Procedures .Critical Care  Performed by: Darrick Grinder, PA-C Authorized by: Darrick Grinder, PA-C   Critical care provider statement:    Critical care time (minutes):  30   Critical care was necessary to treat or prevent imminent or life-threatening deterioration of the following conditions:  Endocrine crisis (DKA)   Critical care was time spent personally by me on the following activities:  Development of  treatment plan with patient or surrogate, discussions with consultants, evaluation of patient's response to treatment, examination of patient, ordering and review of laboratory studies, ordering and review of radiographic studies, ordering and performing treatments and interventions, pulse oximetry, re-evaluation of patient's condition and review of old charts   Care discussed with: admitting provider       Medications Ordered in ED Medications  insulin regular, human (MYXREDLIN) 100 units/ 100 mL infusion (7.5 Units/hr Intravenous Rate/Dose Change 09/20/23 0642)  dextrose 5 % in lactated ringers infusion (has no administration in time range)  dextrose 50 % solution 0-50 mL (has no administration in time range)  potassium chloride 10 mEq in 100 mL IVPB (10 mEq Intravenous New Bag/Given 09/20/23 0541)  lactated ringers infusion ( Intravenous New Bag/Given 09/20/23 0543)  lactated ringers bolus 1,360 mL (0 mLs Intravenous Stopped 09/20/23 0539)  ondansetron (ZOFRAN) injection 4 mg (4 mg Intravenous Given 09/20/23 0439)  fentaNYL (SUBLIMAZE) injection 50 mcg (50 mcg Intravenous Given 09/20/23 0439)    ED Course/ Medical Decision Making/ A&P                                 Medical Decision Making Amount and/or Complexity of Data Reviewed Labs: ordered. Radiology: ordered.  Risk Prescription drug management. Decision regarding hospitalization.   This patient presents to the ED for concern of nausea and vomiting, this involves an extensive number of treatment options, and is a complaint that carries with it a high risk of complications and morbidity.  The differential diagnosis includes DKA, HHS, ACS, gastroenteritis, others   Co morbidities that complicate the patient evaluation  Type I/I.5 diabetes, DKA history   Additional history obtained:  Additional history obtained from EMS External records from outside source obtained and reviewed including endocrinology notes   Lab  Tests:  I Ordered, and personally interpreted labs.  The pertinent results include: Beta hydroxybutyric acid 6.8, troponin 3, anion gap 21, bicarb 19.4, initial glucose 402   Imaging Studies ordered:  I ordered imaging studies including chest x-ray I independently visualized and interpreted imaging which showed no acute findings I agree with the radiologist interpretation   Cardiac Monitoring: / EKG:  The patient was maintained on a cardiac monitor.  I personally viewed and interpreted the cardiac monitored which showed an underlying rhythm of: Sinus rhythm   Consultations Obtained:  I requested consultation with the hospitalist,  and discussed lab and imaging findings as well as pertinent plan - they recommend: Admission by day team   Problem List / ED Course / Critical interventions / Medication management   I ordered medication including fentanyl for pain, Zofran for nausea, lactated Ringer's and insulin for DKA, potassium for potential hypokalemia in the setting of insulin Reevaluation of the patient after these medicines showed that the patient improved I have reviewed the patients home medicines and have made adjustments as needed   Social Determinants of Health:  Social Determinants of Health   Tobacco Use: Medium Risk (09/20/2023)   Patient History    Smoking Tobacco Use: Former    Smokeless Tobacco Use: Never    Passive Exposure: Not on file  Financial Resource Strain: Medium Risk (03/29/2023)   Overall Financial Resource Strain (CARDIA)    Difficulty of Paying Living Expenses: Somewhat hard  Food Insecurity: Food Insecurity Present (05/23/2023)   Hunger Vital Sign    Worried About Running Out of Food in the Last Year: Sometimes true    Ran Out of Food in the Last Year: Never true  Transportation Needs: Unmet Transportation Needs (05/23/2023)   PRAPARE - Transportation    Lack of Transportation (Medical): Yes    Lack of Transportation (Non-Medical): Yes  Physical  Activity: Insufficiently Active (03/29/2023)   Exercise Vital Sign    Days of Exercise per Week: 2 days    Minutes of Exercise per Session: 10 min  Stress: No Stress Concern Present (03/29/2023)   [employer] of Occupational Health - Occupational Stress Questionnaire    Feeling of Stress : Only a little  Social Connections: Unknown (03/29/2023)   Social Connection and Isolation Panel [NHANES]    Frequency of Communication with Friends and Family: More than three times a week    Frequency of Social Gatherings with Friends and Family: Once a week    Attends Religious Services: Patient declined    Database administrator or Organizations: No    Attends Engineer, structural: Not on file    Marital Status: Never married  Depression (PHQ2-9): Low Risk  (07/26/2023)   Depression (PHQ2-9)    PHQ-2 Score: 0  Alcohol Screen: Not on file  Housing: Low Risk  (05/23/2023)   Housing    Last Housing Risk Score: 0  Recent Concern: Housing - Medium Risk (03/29/2023)   Housing    Last Housing Risk Score: 1  Utilities: Not At Risk (05/23/2023)   Utilities    Threatened with loss of utilities: No  Health Literacy: Not on file      Test / Admission - Considered:  Patient in DKA.  Will need admission for further management.         Final Clinical Impression(s) / ED Diagnoses Final diagnoses:  Diabetic ketoacidosis without coma associated with type 1 diabetes mellitus [facility])    Rx / DC Orders ED Discharge Orders     None         Pamala Duffel 09/20/23 5621    Glynn Octave, MD 09/21/23 [phone]

## 2023-09-20 NOTE — ED Triage Notes (Addendum)
Pt BIB EMS from home. Pt reports that he feels like he is going into DKA since yesterday. Pt reports vomiting 6 times and does not feel good. Pt was given 4 mg of Zofran per EMS.

## 2023-09-21 ENCOUNTER — Observation Stay (HOSPITAL_COMMUNITY): Payer: Managed Care, Other (non HMO)

## 2023-09-21 ENCOUNTER — Encounter (HOSPITAL_COMMUNITY): Payer: Self-pay | Admitting: Internal Medicine

## 2023-09-21 DIAGNOSIS — E43 Unspecified severe protein-calorie malnutrition: Secondary | ICD-10-CM | POA: Diagnosis present

## 2023-09-21 DIAGNOSIS — Z91010 Allergy to peanuts: Secondary | ICD-10-CM | POA: Diagnosis not present

## 2023-09-21 DIAGNOSIS — Z91018 Allergy to other foods: Secondary | ICD-10-CM | POA: Diagnosis not present

## 2023-09-21 DIAGNOSIS — E876 Hypokalemia: Secondary | ICD-10-CM | POA: Diagnosis present

## 2023-09-21 DIAGNOSIS — E1043 Type 1 diabetes mellitus with diabetic autonomic (poly)neuropathy: Secondary | ICD-10-CM | POA: Diagnosis present

## 2023-09-21 DIAGNOSIS — Z79899 Other long term (current) drug therapy: Secondary | ICD-10-CM | POA: Diagnosis not present

## 2023-09-21 DIAGNOSIS — K2100 Gastro-esophageal reflux disease with esophagitis, without bleeding: Secondary | ICD-10-CM | POA: Diagnosis present

## 2023-09-21 DIAGNOSIS — R000 Tachycardia, unspecified: Secondary | ICD-10-CM | POA: Diagnosis present

## 2023-09-21 DIAGNOSIS — E1010 Type 1 diabetes mellitus with ketoacidosis without coma: Principal | ICD-10-CM

## 2023-09-21 DIAGNOSIS — Z794 Long term (current) use of insulin: Secondary | ICD-10-CM | POA: Diagnosis not present

## 2023-09-21 DIAGNOSIS — E1165 Type 2 diabetes mellitus with hyperglycemia: Secondary | ICD-10-CM | POA: Diagnosis not present

## 2023-09-21 DIAGNOSIS — Z833 Family history of diabetes mellitus: Secondary | ICD-10-CM | POA: Diagnosis not present

## 2023-09-21 DIAGNOSIS — Z681 Body mass index (BMI) 19 or less, adult: Secondary | ICD-10-CM | POA: Diagnosis not present

## 2023-09-21 DIAGNOSIS — K3184 Gastroparesis: Secondary | ICD-10-CM | POA: Diagnosis present

## 2023-09-21 DIAGNOSIS — Z87891 Personal history of nicotine dependence: Secondary | ICD-10-CM | POA: Diagnosis not present

## 2023-09-21 DIAGNOSIS — G8929 Other chronic pain: Secondary | ICD-10-CM | POA: Diagnosis present

## 2023-09-21 DIAGNOSIS — R066 Hiccough: Secondary | ICD-10-CM | POA: Diagnosis present

## 2023-09-21 DIAGNOSIS — E860 Dehydration: Secondary | ICD-10-CM | POA: Diagnosis present

## 2023-09-21 DIAGNOSIS — F32A Depression, unspecified: Secondary | ICD-10-CM | POA: Diagnosis present

## 2023-09-21 DIAGNOSIS — Z5986 Financial insecurity: Secondary | ICD-10-CM | POA: Diagnosis not present

## 2023-09-21 DIAGNOSIS — R112 Nausea with vomiting, unspecified: Secondary | ICD-10-CM | POA: Diagnosis present

## 2023-09-21 DIAGNOSIS — M545 Low back pain, unspecified: Secondary | ICD-10-CM | POA: Diagnosis present

## 2023-09-21 DIAGNOSIS — Z5941 Food insecurity: Secondary | ICD-10-CM | POA: Diagnosis not present

## 2023-09-21 DIAGNOSIS — K638219 Small intestinal bacterial overgrowth, unspecified: Secondary | ICD-10-CM | POA: Diagnosis present

## 2023-09-21 DIAGNOSIS — Z5982 Transportation insecurity: Secondary | ICD-10-CM | POA: Diagnosis not present

## 2023-09-21 LAB — BASIC METABOLIC PANEL
Anion gap: 11 (ref 5–15)
Anion gap: 8 (ref 5–15)
Anion gap: 9 (ref 5–15)
BUN: 29 mg/dL — ABNORMAL HIGH (ref 6–20)
BUN: 28 mg/dL — ABNORMAL HIGH (ref 6–20)
BUN: 28 mg/dL — ABNORMAL HIGH (ref 6–20)
CO2: 23 mmol/L (ref 22–32)
CO2: 23 mmol/L (ref 22–32)
CO2: 24 mmol/L (ref 22–32)
Calcium: 8.9 mg/dL (ref 8.9–10.3)
Calcium: 8.8 mg/dL — ABNORMAL LOW (ref 8.9–10.3)
Calcium: 8.7 mg/dL — ABNORMAL LOW (ref 8.9–10.3)
Chloride: 106 mmol/L (ref 98–111)
Chloride: 108 mmol/L (ref 98–111)
Chloride: 108 mmol/L (ref 98–111)
Creatinine, Ser: 0.84 mg/dL (ref 0.61–1.24)
Creatinine, Ser: 0.71 mg/dL (ref 0.61–1.24)
Creatinine, Ser: 0.76 mg/dL (ref 0.61–1.24)
GFR, Estimated: >60 mL/min (ref >60)
GFR, Estimated: >60 mL/min (ref >60)
GFR, Estimated: >60 mL/min (ref >60)
Glucose, Bld: 168 mg/dL — ABNORMAL HIGH (ref 70–99)
Glucose, Bld: 150 mg/dL — ABNORMAL HIGH (ref 70–99)
Glucose, Bld: 170 mg/dL — ABNORMAL HIGH (ref 70–99)
Potassium: 3.6 mmol/L (ref 3.5–5.1)
Potassium: 3.4 mmol/L — ABNORMAL LOW (ref 3.5–5.1)
Potassium: 3.3 mmol/L — ABNORMAL LOW (ref 3.5–5.1)
Sodium: 140 mmol/L (ref 135–145)
Sodium: 139 mmol/L (ref 135–145)
Sodium: 141 mmol/L (ref 135–145)

## 2023-09-21 LAB — BETA-HYDROXYBUTYRIC ACID
Beta-Hydroxybutyric Acid: 0.75 mmol/L — ABNORMAL HIGH (ref 0.05–0.27)
Beta-Hydroxybutyric Acid: 0.28 mmol/L — ABNORMAL HIGH (ref 0.05–0.27)
Beta-Hydroxybutyric Acid: 0.4 mmol/L — ABNORMAL HIGH (ref 0.05–0.27)

## 2023-09-21 LAB — GLUCOSE, CAPILLARY
Glucose-Capillary: 119 mg/dL — ABNORMAL HIGH (ref 70–99)
Glucose-Capillary: 137 mg/dL — ABNORMAL HIGH (ref 70–99)
Glucose-Capillary: 145 mg/dL — ABNORMAL HIGH (ref 70–99)
Glucose-Capillary: 149 mg/dL — ABNORMAL HIGH (ref 70–99)
Glucose-Capillary: 154 mg/dL — ABNORMAL HIGH (ref 70–99)
Glucose-Capillary: 157 mg/dL — ABNORMAL HIGH (ref 70–99)
Glucose-Capillary: 164 mg/dL — ABNORMAL HIGH (ref 70–99)
Glucose-Capillary: 165 mg/dL — ABNORMAL HIGH (ref 70–99)
Glucose-Capillary: 171 mg/dL — ABNORMAL HIGH (ref 70–99)
Glucose-Capillary: 175 mg/dL — ABNORMAL HIGH (ref 70–99)
Glucose-Capillary: 179 mg/dL — ABNORMAL HIGH (ref 70–99)
Glucose-Capillary: 200 mg/dL — ABNORMAL HIGH (ref 70–99)
Glucose-Capillary: 239 mg/dL — ABNORMAL HIGH (ref 70–99)
Glucose-Capillary: 310 mg/dL — ABNORMAL HIGH (ref 70–99)

## 2023-09-21 LAB — CBC
HCT: 39.8 % (ref 39.0–52.0)
Hemoglobin: 12.8 g/dL — ABNORMAL LOW (ref 13.0–17.0)
MCH: 27.9 pg (ref 26.0–34.0)
MCHC: 32.2 g/dL (ref 30.0–36.0)
MCV: 86.7 fL (ref 80.0–100.0)
Platelets: 206 10*3/uL (ref 150–400)
RBC: 4.59 MIL/uL (ref 4.22–5.81)
RDW: 13 % (ref 11.5–15.5)
WBC: 6.4 10*3/uL (ref 4.0–10.5)
nRBC: 0 % (ref 0.0–0.2)

## 2023-09-21 MED ORDER — LACTATED RINGERS IV SOLN: INTRAVENOUS | Status: DC

## 2023-09-21 MED ORDER — INSULIN DETEMIR 100 UNIT/ML ~~LOC~~ SOLN
10.0000 [IU] | Freq: Every day | SUBCUTANEOUS | Status: DC
  Administered 2023-09-21: 10 [IU] via SUBCUTANEOUS
  Filled 2023-09-21 (×2): qty 0.1

## 2023-09-21 MED ORDER — SODIUM CHLORIDE 0.9 % IV SOLN: INTRAVENOUS | Status: AC

## 2023-09-21 MED ORDER — INSULIN ASPART 100 UNIT/ML IJ SOLN
0.0000 [IU] | Freq: Every day | INTRAMUSCULAR | Status: DC
  Administered 2023-09-21: 4 [IU] via SUBCUTANEOUS
  Administered 2023-09-22 – 2023-09-24 (×3): 2 [IU] via SUBCUTANEOUS

## 2023-09-21 MED ORDER — DEXTROSE IN LACTATED RINGERS 5 % IV SOLN: INTRAVENOUS | Status: DC

## 2023-09-21 MED ORDER — INSULIN ASPART 100 UNIT/ML IJ SOLN
0.0000 [IU] | Freq: Three times a day (TID) | INTRAMUSCULAR | Status: DC
  Administered 2023-09-21 – 2023-09-22 (×2): 1 [IU] via SUBCUTANEOUS
  Administered 2023-09-22 (×2): 3 [IU] via SUBCUTANEOUS
  Administered 2023-09-23 (×2): 1 [IU] via SUBCUTANEOUS
  Administered 2023-09-23 – 2023-09-24 (×2): 2 [IU] via SUBCUTANEOUS
  Administered 2023-09-24: 3 [IU] via SUBCUTANEOUS
  Administered 2023-09-24 – 2023-09-25 (×2): 2 [IU] via SUBCUTANEOUS

## 2023-09-21 MED ORDER — POTASSIUM CHLORIDE CRYS ER 20 MEQ PO TBCR
20.0000 meq | EXTENDED_RELEASE_TABLET | Freq: Once | ORAL | Status: AC
  Administered 2023-09-21: 20 meq via ORAL
  Filled 2023-09-21: qty 1

## 2023-09-21 MED ORDER — IOHEXOL 300 MG/ML  SOLN
100.0000 mL | Freq: Once | INTRAMUSCULAR | Status: AC | PRN
  Administered 2023-09-21: 100 mL via INTRAVENOUS

## 2023-09-21 MED ORDER — INSULIN ASPART 100 UNIT/ML IJ SOLN
3.0000 [IU] | Freq: Three times a day (TID) | INTRAMUSCULAR | Status: DC
  Administered 2023-09-22 – 2023-09-25 (×6): 3 [IU] via SUBCUTANEOUS

## 2023-09-21 NOTE — Progress Notes (Signed)
   09/21/23 1433  TOC Brief Assessment  Insurance and Status Reviewed  Patient has primary care physician Yes  Home environment has been reviewed Single family home  Prior level of function: Independent  Prior/Current Home Services No current home services  Social Determinants of Health Reivew SDOH reviewed no interventions necessary  Readmission risk has been reviewed Yes  Transition of care needs no transition of care needs at this time

## 2023-09-21 NOTE — Discharge Instructions (Signed)
 FOOD PANTRY [employer] [address] [phone]  [facility] [employer] [address] [place] [phone]  [facility] [employer] [address] [phone]  [facility] [address] [phone]  [facility] [address] [place], [place] [address] [phone]

## 2023-09-21 NOTE — Progress Notes (Addendum)
Preferred oral contrast, but pt.unable to take due to N/V, CT abdomen with IV contrast done.

## 2023-09-21 NOTE — Inpatient Diabetes Management (Signed)
Inpatient Diabetes Program Recommendations  AACE/ADA: New Consensus Statement on Inpatient Glycemic Control (2015)  Target Ranges:  Prepandial:   less than 140 mg/dL      Peak postprandial:   less than 180 mg/dL (1-2 hours)      Critically ill patients:  140 - 180 mg/dL   Lab Results  Component Value Date   GLUCAP 165 (H) 09/21/2023   HGBA1C 9.7 (H) 01/29/2023    Review of Glycemic Control  Diabetes history: DM1, diagnosed in 2017 Outpatient Diabetes medications: Lantus 34 units every day, Lyumjev, approx 12 units TID, depending on size of meal Current orders for Inpatient glycemic control: Transitioned off insulin drip this morning, Levemir 10 units every day, Novolog 0-6 TID and 0-5 HS + 3 units TID  Inpatient Diabetes Program Recommendations:    Consider Novolog 0-6 Q4H while poor po intake Will likely need increase in basal insulin - Levemir 10 units BID  Familiar with pt from previous admissions. Has hx insulin pump use in the past. Pt states he did not miss any insulin doses. Has hx gastroparesis.  Spoke with pt at bedside this morning. Still feels nauseated. Was able to tell me which insulins he takes and whether they were rapid-acting or long-acting. Pt states he does not know why he went into DKA, said he did not miss any insulin doses. Has no problems/concerns with obtaining his insulin prescriptions.  Endo is [facility]. Last OV 02/08/2023.  Pt is not using insulin pump at this time.  Has lost approx 10 kg since 02/08/2023. Recommended f/u with Dr Elvera Lennox and consider getting back on insulin pump.  Pt not as talkative as usual, continues to feel nauseated.  Discussed above with RN.   Will continue to follow.   Thank you. Ailene Ards, RD, LDN, CDCES Inpatient Diabetes Coordinator [phone]

## 2023-09-21 NOTE — Plan of Care (Signed)
  Problem: Clinical Measurements: Goal: Will remain free from infection Outcome: Progressing Goal: Diagnostic test results will improve Outcome: Progressing Goal: Respiratory complications will improve Outcome: Progressing   Problem: Coping: Goal: Level of anxiety will decrease Outcome: Progressing   Problem: Pain Management: Goal: General experience of comfort will improve Outcome: Progressing   Problem: Skin Integrity: Goal: Risk for impaired skin integrity will decrease Outcome: Progressing

## 2023-09-21 NOTE — Progress Notes (Signed)
HOSPITALIST ROUNDING NOTE MUNEEB MISFELDT [id number]  DOB: 01-16-1981  DOA: 09/20/2023  PCP: Mliss Sax, MD  09/21/2023,7:02 AM   LOS: 0 days      Code Status: Full code From: Home  current Dispo: Likely home     42 year old male 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  Surgical placement J-tube-on Reglan BMI 17?  SIBO Chronic low back pain Last admission 8/6 through 05/26/2023  12/5 N/VX6 times, ABD pain, brought to the emergency room by EMS given Zofran-apparently taking long-acting insulin 3 times a day and regular insulin once a day?? On arrival sugar 400 anion gap 21 BUN/creatinine 35/1.1 (baseline creatinine less than 1 BBC 9.0, hemoglobin 14, platelet 2 7 CT ABD no acute findings in abdomen, CXR no active disease  Admitted for DKA   Plan  Mild DKA on admission Gap is closed-attempt diet-transition to 10 units for now given poor p.o., DC D5 fluids but keep on NS for now-if not eating may need to go back to D5 NS  DM TY 2 complication gastroparesis neuropathy Home regimen significant for 30 units Lantus daily lispro 3 times daily 8 to 12 units but patient not taking properly-needs education-diabetic coordinator consulted Hopefully can continue Dexcom Resume Reglan 10 3 times daily home dose, resume Neurontin 600 3 times daily neuropathy Can continue Thorazine 12.5 every 6 as needed short-term  Previous EGD with esophagitis Severe gastroparesis Only happens apparently when he gets DKA Instructions given to nursing to incrementally fill a syringe with fluid and allow him to expand his abdomen slowly  Chronic low back pain Moderate to severe malnutrition  Transferred to MedSurg later today if able to eat drink  Discussed with mother at the bedside  DVT prophylaxis: [place] D  Status is: Observation The patient remains OBS appropriate and will d/c  before 2 midnights.      Subjective: Awake coherent nauseous not able to keep down anything quite sleepy Some abdominal pain ROM intact  Objective + exam Vitals:   09/21/23 0500 09/21/23 0600 09/21/23 0606 09/21/23 0700  BP: (!) 160/75 (!) 146/91  104/69  Pulse: (!) 109 (!) 111  (!) 134  Resp: 20 15  14   Temp:      TempSrc:      SpO2: 100% 99%  98%  Weight:   61.9 kg   Height:       Filed Weights   09/20/23 0403 09/21/23 0606  Weight: 68 kg 61.9 kg    Examination:  EOMI NCAT no icterus no pallor Thin Chest clear no wheeze rales rhonchi ROM and tach Power 5/5   Data Reviewed: reviewed   CBC    Component Value Date/Time   WBC 6.4 09/21/2023 0320   RBC 4.59 09/21/2023 0320   HGB 12.8 (L) 09/21/2023 0320   HCT 39.8 09/21/2023 0320   PLT 206 09/21/2023 0320   MCV 86.7 09/21/2023 0320   MCH 27.9 09/21/2023 0320   MCHC 32.2 09/21/2023 0320   RDW 13.0 09/21/2023 0320   LYMPHSABS 0.7 09/20/2023 0410   MONOABS 0.5 09/20/2023 0410   EOSABS 0.0 09/20/2023 0410   BASOSABS 0.0 09/20/2023 0410      Latest Ref Rng & Units 09/21/2023    3:20 AM 09/21/2023   12:52 AM 09/20/2023    7:45 PM  CMP  Glucose 70 - 99 mg/dL 811  914  782  BUN 6 - 20 mg/dL 28  29  30    Creatinine 0.61 - 1.24 mg/dL 0.10  9.32  3.55   Sodium 135 - 145 mmol/L 139  140  141   Potassium 3.5 - 5.1 mmol/L 3.4  3.6  3.7   Chloride 98 - 111 mmol/L 108  106  107   CO2 22 - 32 mmol/L 23  23  24    Calcium 8.9 - 10.3 mg/dL 8.8  8.9  9.1      Scheduled Meds:  Chlorhexidine Gluconate Cloth  6 each Topical Daily   enoxaparin (LOVENOX) injection  40 mg Subcutaneous Q24H   escitalopram  20 mg Oral Daily   pantoprazole (PROTONIX) IV  40 mg Intravenous Q24H   potassium chloride  20 mEq Oral Once   Continuous Infusions:  chlorproMAZINE (THORAZINE) 12.5 mg in sodium chloride 0.9 % 25 mL IVPB Stopped (09/21/23 7322)   dextrose 5% lactated ringers 125 mL/hr at 09/21/23 0633   insulin 1.7 Units/hr  (09/21/23 0657)   lactated ringers      Time  35  Rhetta Mura, MD  [facility]

## 2023-09-22 DIAGNOSIS — E1010 Type 1 diabetes mellitus with ketoacidosis without coma: Secondary | ICD-10-CM | POA: Diagnosis not present

## 2023-09-22 LAB — GLUCOSE, CAPILLARY
Glucose-Capillary: 290 mg/dL — ABNORMAL HIGH (ref 70–99)
Glucose-Capillary: 263 mg/dL — ABNORMAL HIGH (ref 70–99)
Glucose-Capillary: 165 mg/dL — ABNORMAL HIGH (ref 70–99)
Glucose-Capillary: 208 mg/dL — ABNORMAL HIGH (ref 70–99)

## 2023-09-22 LAB — BASIC METABOLIC PANEL
Anion gap: 9 (ref 5–15)
BUN: 22 mg/dL — ABNORMAL HIGH (ref 6–20)
CO2: 23 mmol/L (ref 22–32)
Calcium: 8.2 mg/dL — ABNORMAL LOW (ref 8.9–10.3)
Chloride: 105 mmol/L (ref 98–111)
Creatinine, Ser: 0.58 mg/dL — ABNORMAL LOW (ref 0.61–1.24)
GFR, Estimated: >60 mL/min (ref >60)
Glucose, Bld: 247 mg/dL — ABNORMAL HIGH (ref 70–99)
Potassium: 3.4 mmol/L — ABNORMAL LOW (ref 3.5–5.1)
Sodium: 137 mmol/L (ref 135–145)

## 2023-09-22 LAB — MAGNESIUM: Magnesium: 1.9 mg/dL (ref 1.7–2.4)

## 2023-09-22 MED ORDER — INSULIN DETEMIR 100 UNIT/ML ~~LOC~~ SOLN
8.0000 [IU] | Freq: Two times a day (BID) | SUBCUTANEOUS | Status: DC
  Administered 2023-09-22 – 2023-09-24 (×6): 8 [IU] via SUBCUTANEOUS
  Filled 2023-09-22 (×8): qty 0.08

## 2023-09-22 MED ORDER — PANTOPRAZOLE SODIUM 40 MG PO TBEC
40.0000 mg | DELAYED_RELEASE_TABLET | Freq: Every day | ORAL | Status: DC
  Administered 2023-09-23 – 2023-09-24 (×2): 40 mg via ORAL
  Filled 2023-09-22 (×2): qty 1

## 2023-09-22 NOTE — Progress Notes (Signed)
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  Surgical placement J-tube-on Reglan BMI 17?  SIBO Chronic low back pain Last admission 8/6 through 05/26/2023  12/5 N/VX6 times, ABD pain, brought to the emergency room by EMS given Zofran-apparently taking long-acting insulin 3 times a day and regular insulin once a day?? On arrival sugar 400 anion gap 21 BUN/creatinine 35/1.1 (baseline creatinine less than 1 BBC 9.0, hemoglobin 14, platelet 2 7 CT ABD no acute findings in abdomen, CXR no active disease  Admitted for DKA Hospitalization complicated by persistent gastroparesis  Plan  Mild DKA on admission Resolved  DM TY 2 complication gastroparesis neuropathy Home regimen significant for 30 units Lantus daily lispro 3 times daily 8 to 12 units but patient not taking properly-needs education-diabetic coordinator consulted Increase long-acting to 8 units twice daily as sugars uncontrolled Continue SSI additionally Only drinking fluids at this time so not ready for discharge if he is able to tolerate falls may be able to go in several days  Previous EGD with esophagitis Severe gastroparesis impacting nutrition Continue Thorazine 12.5 every 6 as needed hiccups, continue Reglan 10 every 6 as needed refractory nausea and Protonix 40 daily Would continue IV fluid for now given high risk of dehydration with poor p.o. intake-as this improves we will stop  Chronic low back pain Moderate to severe malnutrition  Transferred to MedSurg expect will be here several days  Discussed with mother at the bedside on 12/7  DVT prophylaxis: [place]  D  Status is: Observation The patient remains OBS appropriate and will d/c before 2 midnights.      Subjective:  Nausea earlier this morning-tolerated some tea at the bedside Still has not eaten Abdominal pain is moderate  Objective + exam Vitals:   09/22/23 1000 09/22/23 1135 09/22/23 1200 09/22/23 1300  BP: (!) 151/98 (!) 140/86 (!) 147/93 119/78  Pulse: 99 (!) 118 (!) 105 (!) 116  Resp: 14 14 15 15   Temp:   98.5 F (36.9 C)   TempSrc:   Oral   SpO2: 100% 100% 96% 98%  Weight:      Height:       Filed Weights   09/20/23 0403 09/21/23 0606  Weight: 68 kg 61.9 kg    Examination:  Awake coherent no icterus Chest is clear no wheeze Abdomen is obese nontender no rebound ROM is intact    Data Reviewed: reviewed   CBC    Component Value Date/Time   WBC 6.4 09/21/2023 0320   RBC 4.59 09/21/2023 0320   HGB 12.8 (L) 09/21/2023 0320   HCT 39.8 09/21/2023 0320   PLT 206 09/21/2023 0320   MCV 86.7 09/21/2023 0320   MCH 27.9 09/21/2023 0320   MCHC 32.2 09/21/2023 0320   RDW 13.0 09/21/2023 0320   LYMPHSABS 0.7 09/20/2023 0410   MONOABS 0.5 09/20/2023 0410   EOSABS 0.0 09/20/2023 0410   BASOSABS 0.0 09/20/2023 0410      Latest Ref Rng & Units 09/22/2023    2:56 AM 09/21/2023    7:36 AM 09/21/2023    3:20 AM  CMP  Glucose 70 -  99 mg/dL 664  403  474   BUN 6 - 20 mg/dL 22  28  28    Creatinine 0.61 - 1.24 mg/dL 2.59  5.63  8.75   Sodium 135 - 145 mmol/L 137  141  139   Potassium 3.5 - 5.1 mmol/L 3.4  3.3  3.4   Chloride 98 - 111 mmol/L 105  108  108   CO2 22 - 32 mmol/L 23  24  23    Calcium 8.9 - 10.3 mg/dL 8.2  8.7  8.8      Scheduled Meds:  Chlorhexidine Gluconate Cloth  6 each Topical Daily   enoxaparin (LOVENOX) injection  40 mg Subcutaneous Q24H   escitalopram  20 mg Oral Daily   insulin aspart  0-5 Units Subcutaneous QHS   insulin aspart  0-6 Units Subcutaneous TID WC   insulin aspart  3 Units Subcutaneous TID WC   insulin detemir  8 Units  Subcutaneous BID   [START ON 09/23/2023] pantoprazole  40 mg Oral Daily   Continuous Infusions:  chlorproMAZINE (THORAZINE) 12.5 mg in sodium chloride 0.9 % 25 mL IVPB Stopped (09/22/23 0806)    Time  25  Rhetta Mura, MD  [facility]

## 2023-09-22 NOTE — Progress Notes (Signed)
PHARMACIST - PHYSICIAN COMMUNICATION  DR:   Mahala Menghini  CONCERNING: IV to Oral Route Change Policy  RECOMMENDATION: This patient is receiving pantoprazole by the intravenous route.  Based on criteria approved by the Pharmacy and Therapeutics Committee, the intravenous medication(s) is/are being converted to the equivalent oral dose form(s).   DESCRIPTION: These criteria include: The patient is eating (either orally or via tube) and/or has been taking other orally administered medications for a least 24 hours The patient has no evidence of active gastrointestinal bleeding or impaired GI absorption (gastrectomy, short bowel, patient on TNA or NPO).  If you have questions about this conversion, please contact the Pharmacy Department  []   [phone] )  [facility] []   [phone] )  [facility] []   [phone] )  [facility] []   [phone] )  [facility] [x]   [phone] )  [facility]   [place], [facility] 09/22/2023 11:42 AM

## 2023-09-22 NOTE — Progress Notes (Addendum)
Report called to [place], RN on [facility]. Patient will be going to room 1607. Patient made aware as well as his Mother, Si Raider.

## 2023-09-22 NOTE — Plan of Care (Signed)
  Problem: Education: Goal: Knowledge of General Education information will improve Description Including pain rating scale, medication(s)/side effects and non-pharmacologic comfort measures Outcome: Progressing   Problem: Clinical Measurements: Goal: Ability to maintain clinical measurements within normal limits will improve Outcome: Progressing Goal: Will remain free from infection Outcome: Progressing Goal: Cardiovascular complication will be avoided Outcome: Progressing   Problem: Activity: Goal: Risk for activity intolerance will decrease Outcome: Progressing   Problem: Coping: Goal: Level of anxiety will decrease Outcome: Progressing

## 2023-09-22 NOTE — Progress Notes (Signed)
Patient is alert and oriented x4. Complained of hiccups causing him discomfort-gave him prn thorazine. PRN med did decease the hiccups. Patient tried to drink Ginger Ale but vomited 200 mls. Patient refused additional meds to treat N/V. Blood glucose was 310 covered w/4 units of insulin. Midnight blood glucose was 239 showing a decrease from previous. Patient has fatigue and lethargy on assessment. Patient denied additional needs.

## 2023-09-22 NOTE — Plan of Care (Signed)

## 2023-09-23 DIAGNOSIS — E1010 Type 1 diabetes mellitus with ketoacidosis without coma: Secondary | ICD-10-CM | POA: Diagnosis not present

## 2023-09-23 LAB — BASIC METABOLIC PANEL
Anion gap: 11 (ref 5–15)
BUN: 17 mg/dL (ref 6–20)
CO2: 25 mmol/L (ref 22–32)
Calcium: 8.4 mg/dL — ABNORMAL LOW (ref 8.9–10.3)
Chloride: 97 mmol/L — ABNORMAL LOW (ref 98–111)
Creatinine, Ser: 0.63 mg/dL (ref 0.61–1.24)
GFR, Estimated: >60 mL/min (ref >60)
Glucose, Bld: 227 mg/dL — ABNORMAL HIGH (ref 70–99)
Potassium: 3.5 mmol/L (ref 3.5–5.1)
Sodium: 133 mmol/L — ABNORMAL LOW (ref 135–145)

## 2023-09-23 LAB — GLUCOSE, CAPILLARY
Glucose-Capillary: 185 mg/dL — ABNORMAL HIGH (ref 70–99)
Glucose-Capillary: 185 mg/dL — ABNORMAL HIGH (ref 70–99)

## 2023-09-23 MED ORDER — SENNOSIDES-DOCUSATE SODIUM 8.6-50 MG PO TABS
1.0000 | ORAL_TABLET | Freq: Two times a day (BID) | ORAL | Status: DC
  Administered 2023-09-23 – 2023-09-25 (×4): 1 via ORAL
  Filled 2023-09-23 (×4): qty 1

## 2023-09-23 MED ORDER — POLYETHYLENE GLYCOL 3350 17 G PO PACK
17.0000 g | PACK | Freq: Every day | ORAL | Status: DC
  Administered 2023-09-23: 17 g via ORAL
  Filled 2023-09-23: qty 1

## 2023-09-23 NOTE — Plan of Care (Signed)
  Problem: Education: Goal: Knowledge of General Education information will improve Description: Including pain rating scale, medication(s)/side effects and non-pharmacologic comfort measures Outcome: Progressing   Problem: Clinical Measurements: Goal: Ability to maintain clinical measurements within normal limits will improve Outcome: Progressing   Problem: Activity: Goal: Risk for activity intolerance will decrease Outcome: Progressing   Problem: Nutrition: Goal: Adequate nutrition will be maintained Outcome: Progressing   Problem: Coping: Goal: Level of anxiety will decrease Outcome: Progressing   Problem: Elimination: Goal: Will not experience complications related to bowel motility Outcome: Progressing   Problem: Pain Management: Goal: General experience of comfort will improve Outcome: Progressing

## 2023-09-23 NOTE — Plan of Care (Signed)
  Problem: Education: Goal: Knowledge of General Education information will improve Description: Including pain rating scale, medication(s)/side effects and non-pharmacologic comfort measures Outcome: Progressing   Problem: Health Behavior/Discharge Planning: Goal: Ability to manage health-related needs will improve Outcome: Progressing   Problem: Clinical Measurements: Goal: Will remain free from infection Outcome: Progressing Goal: Respiratory complications will improve Outcome: Progressing Goal: Cardiovascular complication will be avoided Outcome: Progressing   Problem: Coping: Goal: Level of anxiety will decrease Outcome: Progressing   Problem: Elimination: Goal: Will not experience complications related to urinary retention Outcome: Progressing   Problem: Skin Integrity: Goal: Risk for impaired skin integrity will decrease Outcome: Progressing

## 2023-09-23 NOTE — Progress Notes (Signed)
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  Surgical placement J-tube-on Reglan BMI 17?  SIBO Chronic low back pain Last admission 8/6 through 05/26/2023  12/5 N/V X6 times, ABD pain, brought to the emergency room by EMS given Zofran-apparently taking long-acting insulin 3 times a day and regular insulin once a day?? On arrival sugar 400 anion gap 21 BUN/creatinine 35/1.1 (baseline creatinine less than 1 BBC 9.0, hemoglobin 14, platelet 2 7 CT ABD no acute findings in abdomen, CXR no active disease  Admitted for DKA Hospitalization complicated by persistent gastroparesis  Plan  Mild DKA on admission Resolved  DM TY 2 complication gastroparesis neuropathy Home regimen significant for 30 units Lantus daily lispro 3 times daily 8 to 12 units but patient not taking properly-needs education-diabetic coordinator consulted Increase long-acting to 8 units twice daily --sugars better on this regimen Po remains poor--willing to uptitrate to full liquids as has only been drinking sweet tea--will give miralax to help empty bowels as no stool and see if this helps  Previous EGD with esophagitis Severe gastroparesis impacting nutrition Continue Thorazine 12.5 q 6 hiccups, continue Reglan 10 every 6 as needed refractory nausea and Protonix 40 daily Stop IVF -periodic labs  Chronic low back pain Moderate to severe malnutrition  Transferred to MedSurg expect will be here several days  Discussed with mother at the bedside on 12/7  DVT prophylaxis: [place] D  Status is: Observation The patient remains OBS appropriate and will d/c before 2  midnights.      Subjective:  Some n this am-last vomit yesterday Still unable to eat and nauseous at smell  Objective + exam Vitals:   09/22/23 1426 09/22/23 1812 09/22/23 2149 09/23/23 0250  BP: (!) 152/91 (!) 150/94 122/81 (!) 148/94  Pulse: (!) 107 99 95 (!) 106  Resp: 20 18 20 20   Temp: 98.2 F (36.8 C) 98.3 F (36.8 C) 98.2 F (36.8 C) 98.8 F (37.1 C)  TempSrc: Oral Oral Oral Oral  SpO2: 100% 100% 98% 100%  Weight:      Height:       Filed Weights   09/20/23 0403 09/21/23 0606  Weight: 68 kg 61.9 kg    Examination:  Awake coherent no icterus Chest is clear no wheeze Abdomen is obese nontender no rebound--slght distension ROM is intact Power 5/5   Data Reviewed: reviewed   CBC    Component Value Date/Time   WBC 6.4 09/21/2023 0320   RBC 4.59 09/21/2023 0320   HGB 12.8 (L) 09/21/2023 0320   HCT 39.8 09/21/2023 0320   PLT 206 09/21/2023 0320   MCV 86.7 09/21/2023 0320   MCH 27.9 09/21/2023 0320   MCHC 32.2 09/21/2023 0320   RDW 13.0 09/21/2023 0320   LYMPHSABS 0.7 09/20/2023 0410   MONOABS 0.5 09/20/2023 0410   EOSABS 0.0 09/20/2023 0410   BASOSABS 0.0 09/20/2023 0410      Latest Ref Rng & Units 09/23/2023    5:51 AM 09/22/2023    2:56 AM 09/21/2023    7:36 AM  CMP  Glucose 70 - 99 mg/dL 829  562  130  BUN 6 - 20 mg/dL 17  22  28    Creatinine 0.61 - 1.24 mg/dL 1.61  0.96  0.45   Sodium 135 - 145 mmol/L 133  137  141   Potassium 3.5 - 5.1 mmol/L 3.5  3.4  3.3   Chloride 98 - 111 mmol/L 97  105  108   CO2 22 - 32 mmol/L 25  23  24    Calcium 8.9 - 10.3 mg/dL 8.4  8.2  8.7      Scheduled Meds:  Chlorhexidine Gluconate Cloth  6 each Topical Daily   enoxaparin (LOVENOX) injection  40 mg Subcutaneous Q24H   escitalopram  20 mg Oral Daily   insulin aspart  0-5 Units Subcutaneous QHS   insulin aspart  0-6 Units Subcutaneous TID WC   insulin aspart  3 Units Subcutaneous TID WC   insulin detemir  8 Units Subcutaneous BID   pantoprazole  40 mg  Oral Daily   Continuous Infusions:  chlorproMAZINE (THORAZINE) 12.5 mg in sodium chloride 0.9 % 25 mL IVPB Stopped (09/22/23 0806)    Time  25  Rhetta Mura, MD  [facility]

## 2023-09-24 DIAGNOSIS — E1010 Type 1 diabetes mellitus with ketoacidosis without coma: Secondary | ICD-10-CM | POA: Diagnosis not present

## 2023-09-24 LAB — BASIC METABOLIC PANEL
Anion gap: 13 (ref 5–15)
BUN: 16 mg/dL (ref 6–20)
CO2: 24 mmol/L (ref 22–32)
Calcium: 8.5 mg/dL — ABNORMAL LOW (ref 8.9–10.3)
Chloride: 96 mmol/L — ABNORMAL LOW (ref 98–111)
Creatinine, Ser: 0.71 mg/dL (ref 0.61–1.24)
GFR, Estimated: >60 mL/min (ref >60)
Glucose, Bld: 221 mg/dL — ABNORMAL HIGH (ref 70–99)
Potassium: 3.3 mmol/L — ABNORMAL LOW (ref 3.5–5.1)
Sodium: 133 mmol/L — ABNORMAL LOW (ref 135–145)

## 2023-09-24 LAB — GLUCOSE, CAPILLARY
Glucose-Capillary: 238 mg/dL — ABNORMAL HIGH (ref 70–99)
Glucose-Capillary: 225 mg/dL — ABNORMAL HIGH (ref 70–99)
Glucose-Capillary: 220 mg/dL — ABNORMAL HIGH (ref 70–99)
Glucose-Capillary: 261 mg/dL — ABNORMAL HIGH (ref 70–99)
Glucose-Capillary: 233 mg/dL — ABNORMAL HIGH (ref 70–99)
Glucose-Capillary: 212 mg/dL — ABNORMAL HIGH (ref 70–99)

## 2023-09-24 MED ORDER — METOCLOPRAMIDE HCL 10 MG PO TABS
10.0000 mg | ORAL_TABLET | Freq: Three times a day (TID) | ORAL | Status: DC
  Administered 2023-09-24 – 2023-09-25 (×2): 10 mg via ORAL
  Filled 2023-09-24 (×2): qty 1

## 2023-09-24 MED ORDER — FLEET ENEMA RE ENEM
1.0000 | ENEMA | Freq: Once | RECTAL | Status: DC
  Filled 2023-09-24: qty 1

## 2023-09-24 MED ORDER — POTASSIUM CHLORIDE CRYS ER 20 MEQ PO TBCR
40.0000 meq | EXTENDED_RELEASE_TABLET | Freq: Every day | ORAL | Status: DC
  Administered 2023-09-24: 40 meq via ORAL
  Filled 2023-09-24: qty 2

## 2023-09-24 NOTE — Plan of Care (Signed)
  Problem: Education: Goal: Knowledge of General Education information will improve Description: Including pain rating scale, medication(s)/side effects and non-pharmacologic comfort measures Outcome: Progressing   Problem: Health Behavior/Discharge Planning: Goal: Ability to manage health-related needs will improve Outcome: Progressing   Problem: Clinical Measurements: Goal: Ability to maintain clinical measurements within normal limits will improve Outcome: Progressing Goal: Will remain free from infection Outcome: Progressing Goal: Diagnostic test results will improve Outcome: Progressing Goal: Respiratory complications will improve Outcome: Progressing Goal: Cardiovascular complication will be avoided Outcome: Progressing   Problem: Activity: Goal: Risk for activity intolerance will decrease Outcome: Progressing   Problem: Coping: Goal: Level of anxiety will decrease Outcome: Progressing   Problem: Elimination: Goal: Will not experience complications related to urinary retention Outcome: Progressing   Problem: Pain Management: Goal: General experience of comfort will improve Outcome: Progressing   Problem: Safety: Goal: Ability to remain free from injury will improve Outcome: Progressing   Problem: Skin Integrity: Goal: Risk for impaired skin integrity will decrease Outcome: Progressing

## 2023-09-24 NOTE — Plan of Care (Signed)
  Problem: Activity: Goal: Risk for activity intolerance will decrease Outcome: Progressing   Problem: Coping: Goal: Level of anxiety will decrease Outcome: Progressing   Problem: Safety: Goal: Ability to remain free from injury will improve Outcome: Progressing   Problem: Skin Integrity: Goal: Risk for impaired skin integrity will decrease Outcome: Progressing   

## 2023-09-24 NOTE — Progress Notes (Signed)
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  Surgical placement J-tube-on Reglan BMI 17?  SIBO Chronic low back pain Last admission 8/6 through 05/26/2023  12/5 N/V X6 times, ABD pain, brought to the emergency room by EMS given Zofran-apparently taking long-acting insulin 3 times a day and regular insulin once a day?? On arrival sugar 400 anion gap 21 BUN/creatinine 35/1.1 (baseline creatinine less than 1 BBC 9.0, hemoglobin 14, platelet 2 7 CT ABD no acute findings in abdomen, CXR no active disease  Admitted for DKA Hospitalization complicated by persistent gastroparesis  Plan  Mild DKA on admission Resolved  DM TY 2 complication gastroparesis neuropathy  Was taking  insulin incorrectly--educated Increase long-acting to 8 units twice daily --sugars better on this regimen in [id number] range Po remains poor--tol liquids only--not ready to d/c Give enema today --told to ambulate  Previous EGD with esophagitis Severe gastroparesis impacting nutrition Continue Thorazine 12.5 q 6 hiccups, change Reglan 1to po and see effect Continue Protonix 40 daily Stop IVF -periodic labs  Chronic low back pain Moderate to severe malnutrition  Mild hypokalemia Replace orally 40 daily.  Labs am  Transferred to MedSurg expect will be here several days  Discussed with mother at the bedside on 12/7 Discussed at patient request with his boss on phone 12/9  DVT prophylaxis:  D  Status is: Observation The patient remains OBS appropriate and will d/c before 2 midnights.      Subjective:  No n/v Tol some liquids--no solids yet.  No  stool No cp  Objective + exam Vitals:   09/23/23 0250 09/23/23 1329 09/23/23 2113 09/24/23 0552  BP: (!) 148/94 128/85 (!) 143/100 (!) 148/91  Pulse: (!) 106 (!) 102 85 88  Resp: 20 20 18 18   Temp: 98.8 F (37.1 C) 98.6 F (37 C) 98.7 F (37.1 C) 98.9 F (37.2 C)  TempSrc: Oral Oral Oral Oral  SpO2: 100% 97% 100% 100%  Weight:      Height:       Filed Weights   09/20/23 0403 09/21/23 0606  Weight: 68 kg 61.9 kg    Examination:  Awake coherent no icterus Chest is clear no wheeze Abdomen is flat nontender  ROM is intact Power 5/5   Data Reviewed: reviewed   CBC    Component Value Date/Time   WBC 6.4 09/21/2023 0320   RBC 4.59 09/21/2023 0320   HGB 12.8 (L) 09/21/2023 0320   HCT 39.8 09/21/2023 0320   PLT 206 09/21/2023 0320   MCV 86.7 09/21/2023 0320   MCH 27.9 09/21/2023 0320   MCHC 32.2 09/21/2023 0320   RDW 13.0 09/21/2023 0320   LYMPHSABS 0.7 09/20/2023 0410   MONOABS 0.5 09/20/2023 0410   EOSABS 0.0 09/20/2023 0410   BASOSABS 0.0 09/20/2023 0410      Latest Ref Rng & Units 09/24/2023    5:12 AM 09/23/2023    5:51 AM 09/22/2023    2:56 AM  CMP  Glucose 70 - 99 mg/dL 811  914  782   BUN 6 - 20 mg/dL 16  17  22    Creatinine 0.61 -  1.24 mg/dL 1.61  0.96  0.45   Sodium 135 - 145 mmol/L 133  133  137   Potassium 3.5 - 5.1 mmol/L 3.3  3.5  3.4   Chloride 98 - 111 mmol/L 96  97  105   CO2 22 - 32 mmol/L 24  25  23    Calcium 8.9 - 10.3 mg/dL 8.5  8.4  8.2      Scheduled Meds:  Chlorhexidine Gluconate Cloth  6 each Topical Daily   enoxaparin (LOVENOX) injection  40 mg Subcutaneous Q24H   escitalopram  20 mg Oral Daily   insulin aspart  0-5 Units Subcutaneous QHS   insulin aspart  0-6 Units Subcutaneous TID WC   insulin aspart  3 Units Subcutaneous TID WC   insulin detemir  8 Units Subcutaneous BID   metoCLOPramide  10 mg Oral TID AC   pantoprazole  40 mg Oral Daily   polyethylene glycol  17 g Oral Daily   potassium chloride  40 mEq Oral Daily    senna-docusate  1 tablet Oral BID   sodium phosphate  1 enema Rectal Once   Continuous Infusions:  chlorproMAZINE (THORAZINE) 12.5 mg in sodium chloride 0.9 % 25 mL IVPB Stopped (09/22/23 0806)    Time  25  Rhetta Mura, MD  [facility]

## 2023-09-25 ENCOUNTER — Encounter: Payer: Self-pay | Admitting: Family Medicine

## 2023-09-25 DIAGNOSIS — Z794 Long term (current) use of insulin: Secondary | ICD-10-CM

## 2023-09-25 DIAGNOSIS — E1165 Type 2 diabetes mellitus with hyperglycemia: Secondary | ICD-10-CM

## 2023-09-25 LAB — BASIC METABOLIC PANEL
Anion gap: 13 (ref 5–15)
BUN: 17 mg/dL (ref 6–20)
CO2: 25 mmol/L (ref 22–32)
Calcium: 8.8 mg/dL — ABNORMAL LOW (ref 8.9–10.3)
Chloride: 96 mmol/L — ABNORMAL LOW (ref 98–111)
Creatinine, Ser: 0.73 mg/dL (ref 0.61–1.24)
GFR, Estimated: >60 mL/min (ref >60)
Glucose, Bld: 243 mg/dL — ABNORMAL HIGH (ref 70–99)
Potassium: 3.5 mmol/L (ref 3.5–5.1)
Sodium: 134 mmol/L — ABNORMAL LOW (ref 135–145)

## 2023-09-25 LAB — GLUCOSE, CAPILLARY: Glucose-Capillary: 219 mg/dL — ABNORMAL HIGH (ref 70–99)

## 2023-09-25 MED ORDER — INSULIN GLARGINE 100 UNIT/ML SOLOSTAR PEN: 12.0000 [IU] | PEN_INJECTOR | Freq: Every day | SUBCUTANEOUS | 2 refills | Status: AC

## 2023-09-25 MED ORDER — LYUMJEV KWIKPEN 100 UNIT/ML ~~LOC~~ SOPN: 8.0000 [IU] | PEN_INJECTOR | Freq: Four times a day (QID) | SUBCUTANEOUS | 3 refills | Status: DC

## 2023-09-25 MED ORDER — POLYETHYLENE GLYCOL 3350 17 G PO PACK: 17.0000 g | PACK | Freq: Every day | ORAL | 0 refills | Status: DC

## 2023-09-25 MED ORDER — POTASSIUM CHLORIDE CRYS ER 20 MEQ PO TBCR: 40.0000 meq | EXTENDED_RELEASE_TABLET | Freq: Every day | ORAL | 0 refills | Status: DC

## 2023-09-25 MED ORDER — METOCLOPRAMIDE HCL 10 MG PO TABS: ORAL_TABLET | ORAL | 0 refills | Status: AC

## 2023-09-25 NOTE — Plan of Care (Signed)
  Problem: Clinical Measurements: Goal: Diagnostic test results will improve Outcome: Progressing Goal: Respiratory complications will improve Outcome: Progressing Goal: Cardiovascular complication will be avoided Outcome: Progressing   Problem: Nutrition: Goal: Adequate nutrition will be maintained Outcome: Progressing   Problem: Coping: Goal: Level of anxiety will decrease Outcome: Progressing

## 2023-09-25 NOTE — Discharge Summary (Signed)
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  Surgical placement J-tube-on Reglan BMI 17?  SIBO Chronic low back pain Last admission 8/6 through 05/26/2023   12/5 N/V X6 times, ABD pain, brought to the emergency room by EMS given Zofran-apparently taking long-acting insulin 3 times a day and regular insulin once a day?? On arrival sugar 400 anion gap 21 BUN/creatinine 35/1.1 (baseline creatinine less than 1 BBC 9.0, hemoglobin 14, platelet 2 7 CT ABD no acute findings in abdomen, CXR no active disease   Admitted for DKA Hospitalization complicated by persistent gastroparesis   Plan   Mild DKA on admission Resolved   DM TY 2 complication gastroparesis neuropathy  Was taking  insulin incorrectly--educated Patient was placed back on long-acting insulin 20 units daily as well as sliding scale as per his home regimen and he will need to follow-up in the outpatient setting uses Dexcom and can get retinopathy and neuropathy  screenings   Previous EGD with esophagitis Severe gastroparesis impacting nutrition Patient was placed on Thorazine IV Reglan and patient was kept on a soft diet and liquid diet during the time that he was here He started to improve and was able to manage taking Reglan p.o. 3 times daily for which tapering prescription has been given Thorazine was discontinued Patient was continued on Protonix I told him that he needs to follow-up with motility clinic and ensure that he does not run out of meds again as that is the biggest risk   Chronic low back pain Moderate to severe malnutrition   Mild hypokalemia Replace orally 40 daily.  Potassium is better but he will need replacement and follow-up in the outpatient setting   Discharge Exam: Vitals:   09/25/23 0025 09/25/23 0631  BP: (!) 134/94 (!) 147/93  Pulse:  100  Resp:  20  Temp:  99 F (37.2 C)  SpO2:  99%    Subj on day of d/c   Awake coherent no distress looks well feels fair  General Exam on discharge  EOMI NCAT no focal deficit no icterus no pallor no wheeze no rales no no rales no rhonchi ROM intact CTAB no added sound No lower extremity edema  Discharge Instructions   Discharge Instructions     Diet - low sodium heart healthy   Complete by: As directed    Discharge instructions   Complete by: As directed    Make sure that you take your insulin correctly remember that the Lantus is a long-acting insulin should be taken daily and the lispro KwikPen is a short acting insulin needs to be taken 3 times a  day We have prescribed some Reglan for you to help with your gut motility and ensure that you are able to keep down food and not have gastroparesis You will need to follow-up at the [facility] motility clinic in the near future and ensure that you continue to do what they recommend Your potassium was a little low this hospital stay so we we will replace it over the next several days and please get it checked in the  outpatient setting I have given you a letter for return to work make sure you do not run out of any of your supplies   Increase activity slowly   Complete by: As directed       Allergies as of 09/25/2023       Reactions   Other Other (See Comments)   All nuts Throat pain   Peanut-containing Drug Products Other (See Comments)   Throat pain        Medication List     STOP taking these medications    multivitamin with minerals Tabs tablet       TAKE these medications    acetaminophen 325 MG tablet Commonly known as: TYLENOL Take 2 tablets (650 mg total) by mouth every 6 (six) hours as needed for mild pain (or Fever >/= 101).   Dexcom G6 Sensor Misc Replace every 10 days   Dexcom G6 Transmitter Misc 1 Device by Does not apply route every 3 (three) months.   escitalopram 10 MG tablet Commonly known as: Lexapro Take 2 tablets (20 mg total) by mouth daily.   feeding supplement (GLUCERNA SHAKE) Liqd Take 237 mLs by mouth 3 (three) times daily between meals.   gabapentin 300 MG capsule Commonly known as: NEURONTIN Take 2 capsules (600 mg total) by mouth 3 (three) times daily.   insulin glargine 100 UNIT/ML Solostar Pen Commonly known as: LANTUS Inject 12 Units into the skin daily. What changed: how much to take   Insulin Pen Needle 32G X 4 MM Misc Use 4-5x a day   lidocaine 5 % Commonly known as: LIDODERM Place 1 patch onto the skin daily. Remove & Discard patch within 12 hours or as directed by MD   Lyumjev KwikPen 100 UNIT/ML KwikPen Generic drug: Insulin Lispro-aabc Inject 8-12 Units into the skin in the morning, at noon, in the evening, and at bedtime.   metoCLOPramide 10 MG tablet Commonly known as: REGLAN Take 1 tablet (10 mg total) by mouth 3 (three) times daily with meals for 7 days, THEN 1 tablet (10 mg total) every 8 (eight) hours as needed for up to 7 days for nausea. Start taking on: September 25, 2023 What changed: See the new instructions.    ondansetron 4 MG tablet Commonly known as: ZOFRAN Take 1 tablet (4 mg total) by mouth every 6 (six) hours as needed for nausea or vomiting.   pantoprazole 40 MG tablet Commonly known as: PROTONIX Take 40 mg by mouth 2 (two) times daily.   polyethylene glycol 17 g packet Commonly known as: MIRALAX / GLYCOLAX Take 17 g by mouth daily.   potassium chloride SA 20 MEQ tablet Commonly known as: KLOR-CON M Take 2 tablets (40 mEq total) by mouth daily.       Allergies  Allergen Reactions   Other Other (See Comments)    All nuts Throat pain   Peanut-Containing Drug Products Other (See Comments)    Throat pain      The results of significant diagnostics from this hospitalization (including  imaging, microbiology, ancillary and laboratory) are listed below for reference.    Significant Diagnostic Studies: CT ABDOMEN PELVIS W CONTRAST  Result Date: 09/21/2023 CLINICAL DATA:  Nausea / vomiting. EXAM: CT ABDOMEN AND PELVIS WITH CONTRAST TECHNIQUE: Multidetector CT imaging of the abdomen and pelvis was performed using the standard protocol following bolus administration of intravenous contrast. RADIATION DOSE REDUCTION: This exam was performed according to the departmental dose-optimization program which includes automated exposure control, adjustment of the mA and/or kV according to patient size and/or use of iterative reconstruction technique. CONTRAST:  OMNIPAQUE IOHEXOL 300 MG/ML  SOLN COMPARISON:  05/22/2023 FINDINGS: Lower chest: Unremarkable. Hepatobiliary: No suspicious focal abnormality within the liver parenchyma. There is no evidence for gallstones, gallbladder wall thickening, or pericholecystic fluid. No intrahepatic or extrahepatic biliary dilation. Pancreas: No focal mass lesion. No dilatation of the main duct. No intraparenchymal cyst. No peripancreatic edema. Spleen: No splenomegaly. No suspicious focal mass lesion. Adrenals/Urinary Tract: No adrenal nodule or mass.  Kidneys unremarkable. No evidence for hydroureter. The urinary bladder appears normal for the degree of distention. Stomach/Bowel: Stomach is unremarkable. No gastric wall thickening. No evidence of outlet obstruction. Duodenum is normally positioned as is the ligament of Treitz. No small bowel wall thickening. No small bowel dilatation. The appendix is not well visualized, but there is no edema or inflammation in the region of the cecal tip to suggest appendicitis. No gross colonic mass. No colonic wall thickening. Vascular/Lymphatic: No abdominal aortic aneurysm. No abdominal aortic atherosclerotic calcification. There is no gastrohepatic or hepatoduodenal ligament lymphadenopathy. No retroperitoneal or mesenteric lymphadenopathy. No pelvic sidewall lymphadenopathy. Reproductive: Unremarkable. Other: No intraperitoneal free fluid. Musculoskeletal: Tiny gas bubbles in the subcutaneous fat of the anterior left abdominal wall likely related to injection site. No worrisome lytic or sclerotic osseous abnormality. IMPRESSION: No acute findings in the abdomen or pelvis. Specifically, no findings to explain the patient's history of nausea and vomiting. Electronically Signed   By: Kennith Center M.D.   On: 09/21/2023 06:30   DG Chest 2 View  Result Date: 09/20/2023 CLINICAL DATA:  Chest pain and shortness of breath. EXAM: CHEST - 2 VIEW COMPARISON:  06/19/2023 FINDINGS: The lungs are clear without focal pneumonia, edema, pneumothorax or pleural effusion. The cardiopericardial silhouette is within normal limits for size. No acute bony abnormality. IMPRESSION: No active cardiopulmonary disease. Electronically Signed   By: Kennith Center M.D.   On: 09/20/2023 05:53    Microbiology: No results found for this or any previous visit (from the past 240 hour(s)).   Labs: Basic Metabolic Panel: Recent Labs  Lab 09/21/23 0736 09/22/23 0256 09/23/23 0551 09/24/23 0512 09/25/23 0500  NA 141 137 133* 133* 134*  K 3.3*  3.4* 3.5 3.3* 3.5  CL 108 105 97* 96* 96*  CO2 24 23 25 24 25   GLUCOSE 170* 247* 227* 221* 243*  BUN 28* 22* 17 16 17   CREATININE 0.76 0.58* 0.63 0.71 0.73  CALCIUM 8.7* 8.2* 8.4* 8.5* 8.8*  MG  --  1.9  --   --   --    Liver Function Tests: No results for input(s): "AST", "ALT", "ALKPHOS", "BILITOT", "PROT", "ALBUMIN" in the last 168 hours. No results for input(s): "LIPASE", "AMYLASE" in the last 168 hours. No results for input(s): "AMMONIA" in the last 168 hours. CBC: Recent Labs  Lab 09/20/23 0410 09/20/23 0417 09/21/23 0320  WBC 9.0  --  6.4  NEUTROABS 7.7  --   --   HGB 14.4 16.0 12.8*  HCT  43.3 47.0 39.8  MCV 85.1  --  86.7  PLT 271  --  206   Cardiac Enzymes: No results for input(s): "CKTOTAL", "CKMB", "CKMBINDEX", "TROPONINI" in the last 168 hours. BNP: BNP (last 3 results) No results for input(s): "BNP" in the last 8760 hours.  ProBNP (last 3 results) No results for input(s): "PROBNP" in the last 8760 hours.  CBG: Recent Labs  Lab 09/24/23 0728 09/24/23 1113 09/24/23 1611 09/24/23 2107 09/25/23 0705  GLUCAP 220* 261* 233* 212* 219*       Signed:  Rhetta Mura MD   [facility] 09/25/2023, 8:18 AM

## 2023-09-25 NOTE — Plan of Care (Signed)
Pt given discharge instructions. Pt verbalized understanding. IV's intact  upon removal, vital signs stable.  Problem: Education: Goal: Knowledge of General Education information will improve Description: Including pain rating scale, medication(s)/side effects and non-pharmacologic comfort measures Outcome: Adequate for Discharge   Problem: Health Behavior/Discharge Planning: Goal: Ability to manage health-related needs will improve Outcome: Adequate for Discharge   Problem: Clinical Measurements: Goal: Ability to maintain clinical measurements within normal limits will improve Outcome: Adequate for Discharge Goal: Will remain free from infection Outcome: Adequate for Discharge Goal: Diagnostic test results will improve Outcome: Adequate for Discharge Goal: Respiratory complications will improve Outcome: Adequate for Discharge Goal: Cardiovascular complication will be avoided Outcome: Adequate for Discharge   Problem: Activity: Goal: Risk for activity intolerance will decrease Outcome: Adequate for Discharge   Problem: Nutrition: Goal: Adequate nutrition will be maintained Outcome: Adequate for Discharge   Problem: Coping: Goal: Level of anxiety will decrease Outcome: Adequate for Discharge   Problem: Elimination: Goal: Will not experience complications related to bowel motility Outcome: Adequate for Discharge Goal: Will not experience complications related to urinary retention Outcome: Adequate for Discharge   Problem: Pain Management: Goal: General experience of comfort will improve Outcome: Adequate for Discharge   Problem: Safety: Goal: Ability to remain free from injury will improve Outcome: Adequate for Discharge   Problem: Skin Integrity: Goal: Risk for impaired skin integrity will decrease Outcome: Adequate for Discharge

## 2023-09-26 ENCOUNTER — Telehealth: Payer: Self-pay

## 2023-09-26 NOTE — Transitions of Care (Post Inpatient/ED Visit) (Unsigned)
   09/26/2023  Name: Edward Walls MRN: [id number] DOB: Sep 18, 1981  Today's TOC FU Call Status: Today's TOC FU Call Status:: Unsuccessful Call (1st Attempt) Unsuccessful Call (1st Attempt) Date: 09/26/23  Attempted to reach the patient regarding the most recent Inpatient/ED visit.  Follow Up Plan: Additional outreach attempts will be made to reach the patient to complete the Transitions of Care (Post Inpatient/ED visit) call.   Signature Arvil Persons, BSN, Charity fundraiser

## 2023-09-27 ENCOUNTER — Telehealth: Payer: Self-pay

## 2023-09-27 NOTE — Transitions of Care (Post Inpatient/ED Visit) (Signed)
   09/27/2023  Name: Edward Walls MRN: [id number] DOB: 26-Feb-1981  Today's TOC FU Call Status: Today's TOC FU Call Status:: Successful TOC FU Call Completed Unsuccessful Call (1st Attempt) Date: 09/26/23 [facility] FU Call Complete Date: 09/27/23 Patient's Name and Date of Birth confirmed.  Transition Care Management Follow-up Telephone Call Date of Discharge: 09/25/23 Discharge Facility: [facility]) Type of Discharge: Inpatient Admission How have you been since you were released from the hospital?: Better Any questions or concerns?: No  Items Reviewed: Did you receive and understand the discharge instructions provided?: Yes Any new allergies since your discharge?: No Dietary orders reviewed?: Yes Do you have support at home?: Yes  Medications Reviewed Today: Medications Reviewed Today   Medications were not reviewed in this encounter     Home Care and Equipment/Supplies: Were Home Health Services Ordered?: NA Any new equipment or medical supplies ordered?: NA  Functional Questionnaire: Do you need assistance with bathing/showering or dressing?: No Do you need assistance with meal preparation?: No Do you need assistance with eating?: No Do you have difficulty maintaining continence: No Do you need assistance with getting out of bed/getting out of a chair/moving?: No Do you have difficulty managing or taking your medications?: No  Follow up appointments reviewed: PCP Follow-up appointment confirmed?: Yes Date of PCP follow-up appointment?: 11/12/23 Follow-up Provider: [facility] Follow-up appointment confirmed?: No Do you need transportation to your follow-up appointment?: No Do you understand care options if your condition(s) worsen?: Yes-patient verbalized understanding    SIGNATURE Arvil Persons, BSN, RN

## 2023-09-27 NOTE — Transitions of Care (Post Inpatient/ED Visit) (Signed)
   09/27/2023  Name: Edward Walls MRN: [id number] DOB: Feb 24, 1981  Today's TOC FU Call Status: Today's TOC FU Call Status:: Unsuccessful Call (1st Attempt) Unsuccessful Call (1st Attempt) Date: 09/27/23  Attempted to reach the patient regarding the most recent Inpatient/ED visit.  Follow Up Plan: Additional outreach attempts will be made to reach the patient to complete the Transitions of Care (Post Inpatient/ED visit) call.     Antionette Fairy, RN,BSN,CCM RN Care Manager Transitions of Care  [place]  Direct Phone: [phone] Toll Free: [phone] Fax: [phone]

## 2023-09-28 ENCOUNTER — Other Ambulatory Visit: Payer: Self-pay | Admitting: Family Medicine

## 2023-09-28 ENCOUNTER — Telehealth: Payer: Self-pay

## 2023-09-28 NOTE — Transitions of Care (Post Inpatient/ED Visit) (Signed)
09/28/2023  Name: Edward Walls MRN: [id number] DOB: 1980/11/09  Today's TOC FU Call Status: Today's TOC FU Call Status:: Successful TOC FU Call Completed TOC FU Call Complete Date: 09/28/23 Patient's Name and Date of Birth confirmed.  Transition Care Management Follow-up Telephone Call Date of Discharge: 09/25/23 Discharge Facility[facility]) Type of Discharge: Inpatient Admission Primary Inpatient Discharge Diagnosis:: "diabetic ketoacidosis" How have you been since you were released from the hospital?: Better (pt voices he is doing good-feeling better-cbg 213-getting ready to take insulin and eat- reports highest cbg 300 since coming home-trying to adhere to diet restrictions, no N&V,BM today-taking meds) Any questions or concerns?: No  Items Reviewed: Did you receive and understand the discharge instructions provided?: Yes Medications obtained,verified, and reconciled?: Yes (Medications Reviewed) Any new allergies since your discharge?: No Dietary orders reviewed?: Yes Type of Diet Ordered:: carb modified Do you have support at home?: Yes People in Home: alone, parent(s) Name of Support/Comfort Primary Source: mom  Medications Reviewed Today: Medications Reviewed Today     Reviewed by Charlyn Minerva, RN (Registered Nurse) on 09/28/23 at 1106  Med List Status: <None>   Medication Order Taking? Sig Documenting Provider Last Dose Status Informant  acetaminophen (TYLENOL) 325 MG tablet [id number] Yes Take 2 tablets (650 mg total) by mouth every 6 (six) hours as needed for mild pain (or Fever >/= 101). Marguerita Merles [place], [place] 09/27/2023 Active Self, Pharmacy Records  Continuous Glucose Sensor (DEXCOM G6 SENSOR) MISC [id number] Yes Replace every 10 days Carlus Pavlov, MD Taking Active Self, Pharmacy Records  Continuous Glucose Transmitter (DEXCOM G6 TRANSMITTER) MISC [id number] Yes 1 Device by Does not apply route every 3 (three) months. Carlus Pavlov,  MD Taking Active Self, Pharmacy Records  escitalopram [facility]) 10 MG tablet [id number] Yes Take 2 tablets (20 mg total) by mouth daily. Mliss Sax, MD 09/27/2023 Active Self, Pharmacy Records  feeding supplement, GLUCERNA SHAKE, (GLUCERNA SHAKE) LIQD [id number] Yes Take 237 mLs by mouth 3 (three) times daily between meals. Marguerita Merles [place], [place] 09/27/2023 Active Self, Pharmacy Records  gabapentin (NEURONTIN) 300 MG capsule [id number] Yes Take 2 capsules (600 mg total) by mouth 3 (three) times daily. Levert Feinstein, MD 09/27/2023 Active Self, Pharmacy Records  insulin glargine (LANTUS) 100 UNIT/ML Solostar Pen [id number] Yes Inject 12 Units into the skin daily. Rhetta Mura, MD 09/27/2023 Active   Insulin Lispro-aabc (LYUMJEV KWIKPEN) 100 UNIT/ML KwikPen [id number] Yes Inject 8-12 Units into the skin in the morning, at noon, in the evening, and at bedtime. Rhetta Mura, MD 09/27/2023 Active   Insulin Pen Needle 32G X 4 MM MISC [id number] Yes Use 4-5x a day Marguerita Merles [place], DO Taking Active Self, Pharmacy Records  lidocaine (LIDODERM) 5 % [id number] Yes Place 1 patch onto the skin daily. Remove & Discard patch within 12 hours or as directed by MD [name], [name], DO Taking Active Self, Pharmacy Records  metoCLOPramide (REGLAN) 10 MG tablet [id number] Yes Take 1 tablet (10 mg total) by mouth 3 (three) times daily with meals for 7 days, THEN 1 tablet (10 mg total) every 8 (eight) hours as needed for up to 7 days for nausea. Rhetta Mura, MD 09/28/2023 Active   ondansetron (ZOFRAN) 4 MG tablet [id number] No Take 1 tablet (4 mg total) by mouth every 6 (six) hours as needed for nausea or vomiting.  Patient not taking: Reported on 09/28/2023   [name], MD Not Taking Active Self, Pharmacy Records  pantoprazole (PROTONIX) 40 MG tablet [id number] Yes  Take 40 mg by mouth 2 (two) times daily. [provider] 09/27/2023 Active Self, Pharmacy Records  polyethylene glycol  (MIRALAX / GLYCOLAX) 17 g packet [id number] Yes Take 17 g by mouth daily. Rhetta Mura, MD 09/27/2023 Active   potassium chloride SA (KLOR-CON M) 20 MEQ tablet [id number] Yes Take 2 tablets (40 mEq total) by mouth daily. Rhetta Mura, MD 09/27/2023 Active             Home Care and Equipment/Supplies: Were Home Health Services Ordered?: NA Any new equipment or medical supplies ordered?: NA  Functional Questionnaire: Do you need assistance with bathing/showering or dressing?: No Do you need assistance with meal preparation?: No Do you need assistance with eating?: No Do you have difficulty maintaining continence: No Do you need assistance with getting out of bed/getting out of a chair/moving?: No Do you have difficulty managing or taking your medications?: No  Follow up appointments reviewed: PCP Follow-up appointment confirmed?: Yes Date of PCP follow-up appointment?: 10/12/23 (discussed importance of follow up within 1-2wks after hosp d/c-as well need for labwork to check potassium level-pt voices he did not want to see provider sooner) Follow-up Provider: Dr. Doreene Burke [facility] Follow-up appointment confirmed?: Yes Date of Specialist follow-up appointment?: 11/14/23 Follow-Up Specialty Provider:: Dr. Gherge-endocrinologist, pt aware to call and  make f/u appt with [facility] as well Do you need transportation to your follow-up appointment?: No Do you understand care options if your condition(s) worsen?: Yes-patient verbalized understanding  SDOH Interventions Today    Flowsheet Row Most Recent Value  SDOH Interventions   Food Insecurity Interventions Intervention Not Indicated  [pt given list of resources on d/c paperwork]  Housing Interventions Intervention Not Indicated  Transportation Interventions Intervention Not Indicated  Utilities Interventions Intervention Not Indicated      TOC interventions discussed/reviewed: -Discussed/reviewed  insurance/health plans benefits -Doctor visit discussed/reviewed -PCP -Doctor visits discussed/reviewed-Specialist -Provided Verbal Education: 30-day TOC program, nutrition/diet education-pt states drinking about two Glucerna per day, meds & their functions, symptom mgmt., fall/safety measures in the home -Disease mgmt. discussed/reviewed: DM mgmt -Provided with RN CM contact info-advised to call for any questions/concerns   Antionette Fairy, RN,BSN,CCM RN Care Manager Transitions of Care  [place]Population [facility]  Direct Phone: [phone] Toll Free: [phone] Fax: [phone]

## 2023-11-12 ENCOUNTER — Ambulatory Visit: Payer: Managed Care, Other (non HMO) | Admitting: Family Medicine

## 2023-11-13 ENCOUNTER — Telehealth: Payer: Self-pay | Admitting: Family Medicine

## 2023-11-13 NOTE — Telephone Encounter (Signed)
Error - 11/12/2023 was TEXT NO and cancelled

## 2023-11-13 NOTE — Telephone Encounter (Signed)
11/12/2023 same day cancel 12/15/2022 no show  2nd missed visit, final warning sent via mail and mychart

## 2023-11-14 ENCOUNTER — Ambulatory Visit: Payer: Managed Care, Other (non HMO) | Admitting: Internal Medicine

## 2023-11-27 ENCOUNTER — Encounter: Payer: Self-pay | Admitting: Internal Medicine

## 2023-11-27 ENCOUNTER — Ambulatory Visit: Payer: Managed Care, Other (non HMO) | Admitting: Internal Medicine

## 2023-11-27 VITALS — BP 120/70 | HR 107 | Ht 75.0 in | Wt 171.0 lb

## 2023-11-27 DIAGNOSIS — E785 Hyperlipidemia, unspecified: Secondary | ICD-10-CM | POA: Diagnosis not present

## 2023-11-27 DIAGNOSIS — E1010 Type 1 diabetes mellitus with ketoacidosis without coma: Secondary | ICD-10-CM | POA: Diagnosis not present

## 2023-11-27 LAB — POCT GLYCOSYLATED HEMOGLOBIN (HGB A1C): Hemoglobin A1C: 12.8 % — AB (ref 4.0–5.6)

## 2023-11-27 NOTE — Progress Notes (Signed)
 Patient ID: Edward Walls, male   DOB: 1981-06-13, 43 y.o.   MRN: [id number]    HPI: Edward Walls is a 43 y.o.-year-old male, returning for follow-up for DM1, dx in 2017, insulin-dependent, uncontrolled, with complications (recurrent DKA episodes).  Last visit 4 months ago.  Interim history: No increased urination, blurry vision, nausea, chest pain.   He continues to have nausea and vomiting.  He had DKA again 09/20/2023.  This was assumed to be due to severe gastroparesis. He mentions that he had a difficult time, going through a divorce. He started Lexapro in 08/2023 >> feels better.  He did not restart the pump since last visit, as advised since he still does not have a functioning pump controler.  Previous pump: OmniPod 5 However, at our visit from 05/2023 she was on basal-bolus insulin regimen. He came off the pump for 1 week before our visit in 05/2023 because of alarms, and also the controller froze.  He contacted the company and they sent him instructions to fix it but he was not able to do so.  Currently on the following insulins: -Lantus -Humalog >> Lyumjev  CGM: -Dexcom G6  Reviewed HbA1c levels: 06/07/2023: HbA1c 8.4% Lab Results  Component Value Date   HGBA1C 9.7 (H) 01/29/2023   HGBA1C 10.6 (H) 09/29/2022   HGBA1C 11.0 (A) 07/28/2022   HGBA1C >15.5 (H) 05/25/2022   HGBA1C >15 02/20/2022   HGBA1C >15 09/29/2021   HGBA1C 12.2 (A) 07/01/2021   HGBA1C 13.7 (A) 11/24/2020   HGBA1C >15.0 09/24/2020   HGBA1C >15.5 (H) 06/04/2020  07/05/2017: GAD Ab <5  Previous pump settings: - Basal rates: 12 am: 1.25 >> 1.35 units/h - Insulin to carb ratio: 12 am: 1 - Target: 12 am: 140 - Correction factor (insulin sensitivity factor):  12 am: 28 - Active insulin time: 4h - Changes infusion site: q3 days  Total daily dose from basal insulin: 79% Total daily dose from bolus insulin: 21% Total daily dose 17.5-30 units Number of boluses a day: 0.7! Stayed in the auto  mode 91% of the time  However, at today's visit he is still on MDI: - Lantus 30 >> 34 units daily  at bedtime (in am) - Lyumjev 10 >> 8 >> 8-12 units before meals  He was previously on the freestyle libre CGM >> now Dexcom G6:   Prev.: - am: 220 (before going to bed) >> 180 - 2h after b'fast: ? - lunch: 180-190 >> 180-190 - 2h after lunch: ? - dinner: 200 >> 180 - 2h after dinner: 200 >> ? - bedtime: see above  Previously:   Lowest sugar was  320 >> 50s ... LO >> 70; he has hypoglycemia awareness in the 80s. Highest sugar was HI >> 300s >> 409 at admission 01/2023 >> 240s >> HI. He had 1 DKA episode before 2024 when he could not afford his insulin. He was admitted 10/25/2022 for the N/V and suspicion of gastroparesis.  Abdominal CT scan and an EGD were normal.   He was admitted again 01/26/2023 for the same symptoms in DKA.  A CT scan of the abdomen was again normal. He was started on Reglan.   He was seen in the emergency department many times afterwards dehydration/nausea/hyperglycemia.  He was started on Reglan and Rifaximin -feeling better on these and gained weight He was in the ER  in 05/22/2023 followed by an admission for DKA - had heart flutter and SOB.  He was referred to cardiology. He  was admitted for DKA 09/20/2023.  Pt's meals are: - Breakfast: oatmeal + banana - Lunch: meatloaf, chilli, salad - Dinner: meat + veggies - Snacks: 8 pm  - crackers, at work -he works nights  No CKD, last BUN/creatinine:  Lab Results  Component Value Date   BUN 17 09/25/2023   BUN 16 09/24/2023   CREATININE 0.73 09/25/2023   CREATININE 0.71 09/24/2023   Lab Results  Component Value Date   MICRALBCREAT 8 07/28/2022   MICRALBCREAT 2.4 05/19/2021   MICRALBCREAT 1.3 01/28/2020   MICRALBCREAT 0.7 12/12/2018   He has dyslipidemia: Lab Results  Component Value Date   CHOL 85 (L) 07/28/2022   HDL 39 (L) 07/28/2022   LDLCALC 37 07/28/2022   TRIG 26 07/28/2022   CHOLHDL 2.2  07/28/2022   -Last eye exam 06/01/2023: No DR  -He denies numbness and tingling in his feet.  Last foot exam was 06/07/2023.  Of note, gastric emptying study (04/09/2023) was normal -[facility]: Solid gastric retention percentages at approximate times with normal ranges:  0 minutes: 100% 60 minutes (normal 37-90%): 58% 120 minutes (normal 30%-60%): 26% 180 minutes (normal 10%-29%): 13% 240 minutes (normal 0%-9%): 1%  Blood glucose levels: 0 minutes: 61 mg/dL 161 minutes: 096 mg/dL 045 minutes: 409 mg/dL  IMPRESSION: CONCLUSION: Normal gastric emptying.  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  NOTE: Methanogenic Archaea are resistant to most common antibiotics except rifaximin, metronidazole, and neomycin, and may require a combined drug regiment to achieve eradication.   Pt has FH of DM in M, F, all 3 of his brothers - all have DM2.  Latest TSH was normal: 04/04/2023: TSH 0.872 Lab Results  Component Value Date   TSH 0.395 03/26/2023   TSH 0.96 02/21/2023   TSH 0.740 07/28/2022   TSH 1.390 12/14/2020   TSH 0.59 12/12/2018   ROS: + See HPI  I reviewed pt's medications, allergies, PMH, social hx, family hx, and changes were documented in the history of present illness. Otherwise, unchanged from my initial visit note.  Past Medical History:  Diagnosis Date   Diabetes mellitus without complication (HCC)    Leg pain    Leg weakness    Seasonal allergies    Past Surgical History:  Procedure Laterality Date   DENTAL SURGERY     ESOPHAGOGASTRODUODENOSCOPY (EGD) WITH PROPOFOL N/A 10/27/2022   Procedure: ESOPHAGOGASTRODUODENOSCOPY (EGD) WITH PROPOFOL;  Surgeon: Edward Rist, MD;  Location: [facility];  Service: Gastroenterology;  Laterality: N/A;   Social History   Socioeconomic History   Marital status: Single     Spouse name: Not on file   Number of children:  To: 39 and 17 10/2008   Years of education: Not on file   Highest education level: Not on file  Occupational History    Distribution receiver for [employer]  Social Needs   Financial resource strain: Not on file   Food insecurity:    Worry: Not on file    Inability: Not on file   Transportation needs:    Medical: Not on file    Non-medical: Not on file  Tobacco Use   Smoking status: Former Smoker    Types: Cigarettes   Smokeless tobacco: Never Used  Substance and Sexual Activity   Alcohol use:  no    Alcohol/week:   Types:  Drug use: Not Currently   Sexual activity:  Not on file  Lifestyle   Physical activity:    Days per week: Not on file    Minutes per session: Not on file   Stress: Not on file  Relationships   Social connections:    Talks on phone: Not on file    Gets together: Not on file    Attends religious service: Not on file    Active member of club or organization: Not on file    Attends meetings of clubs or organizations: Not on file    Relationship status: Not on file   Intimate partner violence:    Fear of current or ex partner: Not on file    Emotionally abused: Not on file    Physically abused: Not on file    Forced sexual activity: Not on file  Other Topics Concern   Not on file  Social History Narrative   Not on file   Current Outpatient Medications on File Prior to Visit  Medication Sig Dispense Refill   acetaminophen (TYLENOL) 325 MG tablet Take 2 tablets (650 mg total) by mouth every 6 (six) hours as needed for mild pain (or Fever >/= 101). 20 tablet 0   Continuous Glucose Sensor (DEXCOM G6 SENSOR) MISC Replace every 10 days 9 each 3   Continuous Glucose Transmitter (DEXCOM G6 TRANSMITTER) MISC 1 Device by Does not apply route every 3 (three) months. 1 each 3   escitalopram (LEXAPRO) 10 MG tablet Take 2 tablets (20 mg total) by mouth daily. 60 tablet 5   feeding supplement, GLUCERNA SHAKE,  (GLUCERNA SHAKE) LIQD Take 237 mLs by mouth 3 (three) times daily between meals. 2370 mL 0   gabapentin (NEURONTIN) 300 MG capsule Take 2 capsules (600 mg total) by mouth 3 (three) times daily. 180 capsule 11   insulin glargine (LANTUS) 100 UNIT/ML Solostar Pen Inject 12 Units into the skin daily. 3.6 mL 2   Insulin Lispro-aabc (LYUMJEV KWIKPEN) 100 UNIT/ML KwikPen Inject 8-12 Units into the skin in the morning, at noon, in the evening, and at bedtime. 30 mL 3   Insulin Pen Needle 32G X 4 MM MISC Use 4-5x a day 300 each 3   lidocaine (LIDODERM) 5 % Place 1 patch onto the skin daily. Remove & Discard patch within 12 hours or as directed by MD 30 patch 0   metoCLOPramide (REGLAN) 10 MG tablet Take 1 tablet (10 mg total) by mouth 3 (three) times daily with meals for 7 days, THEN 1 tablet (10 mg total) every 8 (eight) hours as needed for up to 7 days for nausea. 42 tablet 0   ondansetron (ZOFRAN) 4 MG tablet Take 1 tablet (4 mg total) by mouth every 6 (six) hours as needed for nausea or vomiting. (Patient not taking: Reported on 09/28/2023) 20 tablet 0   pantoprazole (PROTONIX) 40 MG tablet Take 40 mg by mouth 2 (two) times daily.     polyethylene glycol (MIRALAX / GLYCOLAX) 17 g packet Take 17 g by mouth daily. 14 each 0   potassium chloride SA (KLOR-CON M) 20 MEQ tablet Take 2 tablets (40 mEq total) by mouth daily. 10 tablet 0   No current facility-administered medications on file prior to visit.   Allergies  Allergen Reactions   Other Other (See Comments)    All nuts Throat pain   Peanut-Containing Drug Products Other (See Comments)    Throat pain   Family History  Problem Relation Age of Onset   Diabetes Mellitus II Mother  Diabetes Mellitus II Father    Diabetes Mellitus II Brother    PE: BP 120/70   Pulse (!) 107   Ht 6\' 3"  (1.905 m)   Wt 171 lb (77.6 kg)   SpO2 99%   BMI 21.37 kg/m  Wt Readings from Last 3 Encounters:  11/27/23 171 lb (77.6 kg)  09/21/23 136 lb 7.4 oz (61.9  kg)  07/26/23 154 lb 3.2 oz (69.9 kg)        Constitutional: normal weight, in NAD Eyes:  EOMI, no exophthalmos ENT: no neck masses, no cervical lymphadenopathy Cardiovascular: tachycardia, RR, No MRG Respiratory: CTA B Musculoskeletal: no deformities Skin:no rashes Neurological: no tremor with outstretched hands  ASSESSMENT: 1. DM1, insulin-dependent, uncontrolled, without long-term complications, but with DKA episodes  Component     Latest Ref Rng & Units 12/12/2018  Glucose, Plasma     65 - 99 mg/dL 161 (H)  C-Peptide     0.80 - 3.85 ng/mL 0.52 (L)  Islet Cell Ab     Neg:<1:1 Negative  ZNT8 Antibodies     U/mL <15   2.  Dyslipidemia  PLAN:  1. Patient with longstanding, very uncontrolled type 1 diabetes, with repeated DKA episodes for various reasons including not affording insulin in the past, putting Lantus in the insulin pump instead of the rapid acting insulin, or nausea/vomiting.  Latest DKA episode was 2 months ago.  At that time, he again had nausea and vomiting. -At last visit he was off the insulin pump due to the fact that his controller was defective.  I advised him to call the company right away and get another controller to start back on the pump.  He was not into the clarity app so we could not review his Dexcom downloads.  Reviewed the blood sugars on his phone for the previous 24 hours, his sugars were increasing during his workday (working nights) so we discussed about increasing the dose of Lyumjev with meals.  We did not change the dose otherwise.  I strongly advised him to get back on the insulin pump (OmniPod 5) and the sensor.  At last visit the HbA1c was better, at 8.4%. -At today's visit, he is still off the pump but he was able to restart the sensor -CGM interpretation:  -At today's visit, we reviewed his CGM downloads: It appears that 5% of values are in target range (goal >70%), while 95% are higher than 180 (goal <25%), and 0% are lower than 70 (goal  <4%).  The calculated average blood sugar is 341.  The projected HbA1c for the next 3 months (GMI) is 11.5%. -Reviewing the CGM trends, it appears that sugars are extremely high, at all times of the day, especially increasing overnight and then decreasing slightly during the day, barely touching the target interval.  He mentions that he is missing some doses of insulin and I suspect that he is missing quite a few of them.  We discussed about the poor prognosis of having such high blood sugars including complications like strokes and heart attack, but also kidney failure and amputations. We did discuss about the  need to go back on insulin pump.  He would be open to go back to the OmniPod but is not sure what to do about the controller.  At today's visit, we were able to give him a sample controller and this was set up for him by the diabetes educator.  We will start back Lyumjev in the pump.  I advised  him to enter between 8 to 12 g of carbs for each meal but to adjust the dose up if needed afterwards. - I suggested to:  Patient Instructions  Please start back on the pump with the following pump settings: - Basal rates: 12 am: 1.35 units/h - Insulin to carb ratio: 12 am: 1:1 - Target: 12 am: 140 - Correction factor (insulin sensitivity factor):  12 am: 28 - Active insulin time: 4h  Enter between 8-12 g carbs in the pump and start the boluses right before meals.  Please return in 1 month.  - we checked his HbA1c: 12.8% (much increased) - advised to check sugars at different times of the day - 4x a day, rotating check times - advised for yearly eye exams >> he is UTD - will check an ACR today - return to clinic in 1 month  2.  Dyslipidemia -Latest lipid panel was reviewed from 07/2022: Fractions at goal with the exception of a low HDL Lab Results  Component Value Date   CHOL 85 (L) 07/28/2022   HDL 39 (L) 07/28/2022   LDLCALC 37 07/28/2022   TRIG 26 07/28/2022   CHOLHDL 2.2 07/28/2022   -He is not on a statin -He is due for another lipid panel -will check this today  Carlus Pavlov, MD PhD [facility]

## 2023-11-27 NOTE — Patient Instructions (Addendum)
Please start back on the pump with the following pump settings: - Basal rates: 12 am: 1.35 units/h - Insulin to carb ratio: 12 am: 1:1 - Target: 12 am: 140 - Correction factor (insulin sensitivity factor):  12 am: 28 - Active insulin time: 4h  Enter between 8-12 g carbs in the pump and start the boluses right before meals.  Please return in 1 month.

## 2023-11-28 ENCOUNTER — Ambulatory Visit: Payer: Managed Care, Other (non HMO) | Admitting: Nutrition

## 2023-11-28 ENCOUNTER — Encounter: Payer: Self-pay | Admitting: Internal Medicine

## 2023-11-28 ENCOUNTER — Telehealth: Payer: Self-pay | Admitting: Nutrition

## 2023-11-28 LAB — LIPID PANEL W/REFLEX DIRECT LDL
Cholesterol: 121 mg/dL (ref <200)
HDL: 52 mg/dL (ref >=40)
LDL Cholesterol (Calc): 55 mg/dL
Non-HDL Cholesterol (Calc): 69 mg/dL (ref <130)
Total CHOL/HDL Ratio: 2.3 (calc) (ref <5.0)
Triglycerides: 68 mg/dL (ref <150)

## 2023-11-28 LAB — MICROALBUMIN / CREATININE URINE RATIO
Creatinine, Urine: 82 mg/dL (ref 20–320)
Microalb Creat Ratio: 9 mg/g{creat} (ref <30)
Microalb, Ur: 0.7 mg/dL

## 2023-11-28 NOTE — Telephone Encounter (Signed)
Per Dr. Charlean Sanfilippo orders, a new PDM was given to patient.  It required a full charge and update taking 1.5 hours.  The patient could not wait that long.  He will return tomorrow with a pod and insulin to restart his OmniPod pump.

## 2023-11-28 NOTE — Telephone Encounter (Signed)
Patient called 10 minutes after his appointment time to say he had a "bad headache and needed to reschedule his pump start.  This was rescheduled for next Monday.

## 2023-12-03 ENCOUNTER — Ambulatory Visit: Payer: Managed Care, Other (non HMO) | Admitting: Nutrition

## 2023-12-19 ENCOUNTER — Telehealth: Payer: Self-pay | Admitting: Nutrition

## 2023-12-19 NOTE — Telephone Encounter (Signed)
 Patient comnacted again to let him know that I was not able to get into the PDM.  Pt. Could not remember code.  He will take the PDM home with the latest office note settings from Dr. Elvera Lennox and put those settings into the pdm once he unlocks the PDM.  He reports that he can make those changes himself.

## 2023-12-19 NOTE — Telephone Encounter (Signed)
 LVM that he wanted to come by to pick up his PDM.  I called him back saying that he can pick it up at the front desk, but that if he wanted to see me for a refresher on how to use it, I have some time on Monday to see him

## 2023-12-24 ENCOUNTER — Ambulatory Visit: Payer: Managed Care, Other (non HMO) | Admitting: Family Medicine

## 2024-01-15 ENCOUNTER — Other Ambulatory Visit: Payer: Self-pay | Admitting: Internal Medicine

## 2024-01-15 DIAGNOSIS — E1010 Type 1 diabetes mellitus with ketoacidosis without coma: Secondary | ICD-10-CM

## 2024-01-23 ENCOUNTER — Ambulatory Visit: Payer: Managed Care, Other (non HMO) | Admitting: Internal Medicine

## 2024-03-20 ENCOUNTER — Encounter (HOSPITAL_COMMUNITY): Payer: Self-pay

## 2024-03-20 ENCOUNTER — Emergency Department (HOSPITAL_COMMUNITY)
Admission: EM | Admit: 2024-03-20 | Discharge: 2024-03-20 | Disposition: A | Discharge status: Home / Self Care | Attending: Student | Admitting: Student

## 2024-03-20 ENCOUNTER — Emergency Department (HOSPITAL_COMMUNITY)

## 2024-03-20 ENCOUNTER — Other Ambulatory Visit: Payer: Self-pay

## 2024-03-20 DIAGNOSIS — E109 Type 1 diabetes mellitus without complications: Secondary | ICD-10-CM | POA: Insufficient documentation

## 2024-03-20 DIAGNOSIS — Z87891 Personal history of nicotine dependence: Secondary | ICD-10-CM | POA: Insufficient documentation

## 2024-03-20 DIAGNOSIS — M25461 Effusion, right knee: Secondary | ICD-10-CM | POA: Insufficient documentation

## 2024-03-20 DIAGNOSIS — Z91010 Allergy to peanuts: Secondary | ICD-10-CM | POA: Insufficient documentation

## 2024-03-20 DIAGNOSIS — Y9361 Activity, american tackle football: Secondary | ICD-10-CM | POA: Insufficient documentation

## 2024-03-20 DIAGNOSIS — X501XXA Overexertion from prolonged static or awkward postures, initial encounter: Secondary | ICD-10-CM | POA: Insufficient documentation

## 2024-03-20 DIAGNOSIS — S8991XA Unspecified injury of right lower leg, initial encounter: Secondary | ICD-10-CM | POA: Diagnosis present

## 2024-03-20 MED ORDER — KETOROLAC TROMETHAMINE 15 MG/ML IJ SOLN
15.0000 mg | Freq: Once | INTRAMUSCULAR | Status: AC
Start: 2024-03-20 — End: 2024-03-20
  Administered 2024-03-20: 15 mg via INTRAMUSCULAR
  Filled 2024-03-20: qty 1

## 2024-03-20 MED ORDER — NAPROXEN 375 MG PO TABS: 375.0000 mg | ORAL_TABLET | Freq: Two times a day (BID) | ORAL | 0 refills | Status: DC

## 2024-03-20 NOTE — ED Provider Triage Note (Signed)
 Emergency Medicine Provider Triage Evaluation Note  Edward Walls , a 43 y.o. male  was evaluated in triage.  Pt complains of R knee pain, patient states he had a non-contact injury playing football where he felt like his knee gave out. Patient states he felt like he was going to roll his ankle running so he "locked up his knee and his knee twisted weird causing him to fall". Patient states he then fell on the ground. Patient ambulatory after but with limp. Denies previous knee surgeries.   Review of Systems  Positive: R knee pain  Negative: Fever, chills, chest pain, shortness of breath   Physical Exam  BP 94/78 (BP Location: Left Arm)   Pulse 71   Temp 99 F (37.2 C) (Oral)   Resp 17   SpO2 98%  Gen:   Awake, no distress   Resp:  Normal effort  MSK:   Pain with extension and flexion of R knee, mild R knee effusion when compared to L, ROM intact, no weakness, neurovascularly intact distally to R knee, no calf tenderness, no femur tenderness, R knee nontender to palpation Other:    Medical Decision Making  Medically screening exam initiated at 8:48 PM.  Appropriate orders placed.  Edward Walls was informed that the remainder of the evaluation will be completed by another provider, this initial triage assessment does not replace that evaluation, and the importance of remaining in the ED until their evaluation is complete.  R knee xray ordered    Edward Hymen, PA-C 03/20/24 2052

## 2024-03-20 NOTE — ED Triage Notes (Signed)
 Pt. Arrives for an injury to the right knee after playing sports. States that the knee feels disconnected and is painful when sitting still. Swelling noted to the right knee.
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
## 2024-03-22 ENCOUNTER — Telehealth

## 2024-03-23 ENCOUNTER — Ambulatory Visit
Admission: RE | Admit: 2024-03-23 | Discharge: 2024-03-23 | Disposition: A | Discharge status: Home / Self Care | Payer: Self-pay | Source: Ambulatory Visit | Attending: Family Medicine | Admitting: Family Medicine

## 2024-03-23 VITALS — BP 130/75 | HR 117 | Temp 99.5°F | Resp 18

## 2024-03-23 DIAGNOSIS — M25561 Pain in right knee: Secondary | ICD-10-CM

## 2024-03-23 DIAGNOSIS — Z0289 Encounter for other administrative examinations: Secondary | ICD-10-CM | POA: Diagnosis not present
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
## 2024-03-23 NOTE — Discharge Instructions (Addendum)
 Your work note is at the back of the packet. Schedule a follow-up appointment with Dr. Carlye Child orthopedics for ASAP. Dr. Conny Del information is below on your discharge instructions.  Rest, ice, and elevate the knee. Wear knee immobilizer.  Continue taking naproxen  375mg  every 12 hours as needed for pain.   If you develop any new or worsening symptoms or if your symptoms do not start to improve, please return here or follow-up with your primary care provider. If your symptoms are severe, please go to the emergency room.

## 2024-03-23 NOTE — ED Triage Notes (Signed)
 Pt was seen in the ED on 03/20/24 for a right knee injury. He needs a letter for work in order to return with restrictions. Pt was unaware he needed to follow up with Ortho.

## 2024-03-30 ENCOUNTER — Inpatient Hospital Stay (HOSPITAL_COMMUNITY)

## 2024-03-30 ENCOUNTER — Inpatient Hospital Stay (HOSPITAL_COMMUNITY)
Admission: EM | Admit: 2024-03-30 | Discharge: 2024-04-04 | DRG: 871 | Disposition: A | Discharge status: Home / Self Care | Attending: Internal Medicine | Admitting: Internal Medicine

## 2024-03-30 ENCOUNTER — Emergency Department (HOSPITAL_COMMUNITY)

## 2024-03-30 DIAGNOSIS — E1110 Type 2 diabetes mellitus with ketoacidosis without coma: Principal | ICD-10-CM | POA: Diagnosis present

## 2024-03-30 DIAGNOSIS — N179 Acute kidney failure, unspecified: Secondary | ICD-10-CM

## 2024-03-30 DIAGNOSIS — E872 Acidosis, unspecified: Secondary | ICD-10-CM

## 2024-03-30 DIAGNOSIS — K5900 Constipation, unspecified: Secondary | ICD-10-CM

## 2024-03-30 DIAGNOSIS — E876 Hypokalemia: Secondary | ICD-10-CM

## 2024-03-30 DIAGNOSIS — F32A Depression, unspecified: Secondary | ICD-10-CM

## 2024-03-30 DIAGNOSIS — E1010 Type 1 diabetes mellitus with ketoacidosis without coma: Principal | ICD-10-CM

## 2024-03-30 DIAGNOSIS — R6510 Systemic inflammatory response syndrome (SIRS) of non-infectious origin without acute organ dysfunction: Secondary | ICD-10-CM

## 2024-03-30 DIAGNOSIS — K219 Gastro-esophageal reflux disease without esophagitis: Secondary | ICD-10-CM

## 2024-03-30 DIAGNOSIS — G9341 Metabolic encephalopathy: Secondary | ICD-10-CM

## 2024-03-30 DIAGNOSIS — R4182 Altered mental status, unspecified: Secondary | ICD-10-CM

## 2024-03-30 DIAGNOSIS — K2100 Gastro-esophageal reflux disease with esophagitis, without bleeding: Secondary | ICD-10-CM | POA: Diagnosis present

## 2024-03-30 DIAGNOSIS — Z1152 Encounter for screening for COVID-19: Secondary | ICD-10-CM | POA: Diagnosis not present

## 2024-03-30 DIAGNOSIS — E1042 Type 1 diabetes mellitus with diabetic polyneuropathy: Secondary | ICD-10-CM | POA: Diagnosis present

## 2024-03-30 DIAGNOSIS — E860 Dehydration: Secondary | ICD-10-CM | POA: Diagnosis present

## 2024-03-30 DIAGNOSIS — X58XXXD Exposure to other specified factors, subsequent encounter: Secondary | ICD-10-CM | POA: Diagnosis present

## 2024-03-30 DIAGNOSIS — E7220 Disorder of urea cycle metabolism, unspecified: Secondary | ICD-10-CM | POA: Diagnosis present

## 2024-03-30 DIAGNOSIS — R7989 Other specified abnormal findings of blood chemistry: Secondary | ICD-10-CM | POA: Diagnosis present

## 2024-03-30 DIAGNOSIS — R0682 Tachypnea, not elsewhere classified: Secondary | ICD-10-CM | POA: Diagnosis present

## 2024-03-30 DIAGNOSIS — E875 Hyperkalemia: Secondary | ICD-10-CM | POA: Diagnosis present

## 2024-03-30 DIAGNOSIS — A419 Sepsis, unspecified organism: Principal | ICD-10-CM | POA: Diagnosis present

## 2024-03-30 DIAGNOSIS — S8391XD Sprain of unspecified site of right knee, subsequent encounter: Secondary | ICD-10-CM

## 2024-03-30 DIAGNOSIS — E8809 Other disorders of plasma-protein metabolism, not elsewhere classified: Secondary | ICD-10-CM | POA: Diagnosis not present

## 2024-03-30 LAB — URINALYSIS, W/ REFLEX TO CULTURE (INFECTION SUSPECTED)
Bacteria, UA: NONE SEEN
Bilirubin Urine: NEGATIVE
Glucose, UA: >=500 mg/dL — AB
Ketones, ur: 80 mg/dL — AB
Leukocytes,Ua: NEGATIVE
Nitrite: NEGATIVE
Protein, ur: 100 mg/dL — AB
Specific Gravity, Urine: 1.023 (ref 1.005–1.030)
pH: 5 (ref 5.0–8.0)

## 2024-03-30 LAB — CBC WITH DIFFERENTIAL/PLATELET
Abs Immature Granulocytes: 0.23 10*3/uL — ABNORMAL HIGH (ref 0.00–0.07)
Basophils Absolute: 0 10*3/uL (ref 0.0–0.1)
Basophils Relative: 0 %
Eosinophils Absolute: 0 10*3/uL (ref 0.0–0.5)
Eosinophils Relative: 0 %
HCT: 46.6 % (ref 39.0–52.0)
Hemoglobin: 13.5 g/dL (ref 13.0–17.0)
Immature Granulocytes: 1 %
Lymphocytes Relative: 4 %
Lymphs Abs: 0.7 10*3/uL (ref 0.7–4.0)
MCH: 28.5 pg (ref 26.0–34.0)
MCHC: 29 g/dL — ABNORMAL LOW (ref 30.0–36.0)
MCV: 98.3 fL (ref 80.0–100.0)
Monocytes Absolute: 2.7 10*3/uL — ABNORMAL HIGH (ref 0.1–1.0)
Monocytes Relative: 15 %
Neutro Abs: 14.7 10*3/uL — ABNORMAL HIGH (ref 1.7–7.7)
Neutrophils Relative %: 80 %
Platelets: 400 10*3/uL (ref 150–400)
RBC: 4.74 MIL/uL (ref 4.22–5.81)
RDW: 13.8 % (ref 11.5–15.5)
WBC: 18.4 10*3/uL — ABNORMAL HIGH (ref 4.0–10.5)
nRBC: 0 % (ref 0.0–0.2)

## 2024-03-30 LAB — I-STAT CHEM 8, ED
BUN: 51 mg/dL — ABNORMAL HIGH (ref 6–20)
Calcium, Ion: 1.1 mmol/L — ABNORMAL LOW (ref 1.15–1.40)
Chloride: 103 mmol/L (ref 98–111)
Creatinine, Ser: 2.4 mg/dL — ABNORMAL HIGH (ref 0.61–1.24)
Glucose, Bld: >700 mg/dL (ref 70–99)
HCT: 49 % (ref 39.0–52.0)
Hemoglobin: 16.7 g/dL (ref 13.0–17.0)
Potassium: 6.6 mmol/L (ref 3.5–5.1)
Sodium: 135 mmol/L (ref 135–145)
TCO2: 7 mmol/L — ABNORMAL LOW (ref 22–32)

## 2024-03-30 LAB — CBG MONITORING, ED
Glucose-Capillary: >600 mg/dL (ref 70–99)
Glucose-Capillary: >600 mg/dL (ref 70–99)
Glucose-Capillary: >600 mg/dL (ref 70–99)
Glucose-Capillary: >600 mg/dL (ref 70–99)
Glucose-Capillary: >600 mg/dL (ref 70–99)

## 2024-03-30 LAB — COMPREHENSIVE METABOLIC PANEL WITH GFR
ALT: 23 U/L (ref 0–44)
AST: 26 U/L (ref 15–41)
Albumin: 4 g/dL (ref 3.5–5.0)
Alkaline Phosphatase: 128 U/L — ABNORMAL HIGH (ref 38–126)
BUN: 42 mg/dL — ABNORMAL HIGH (ref 6–20)
CO2: <7 mmol/L — ABNORMAL LOW (ref 22–32)
Calcium: 8.8 mg/dL — ABNORMAL LOW (ref 8.9–10.3)
Chloride: 100 mmol/L (ref 98–111)
Creatinine, Ser: 2.76 mg/dL — ABNORMAL HIGH (ref 0.61–1.24)
GFR, Estimated: 29 mL/min — ABNORMAL LOW (ref >60)
Glucose, Bld: 857 mg/dL (ref 70–99)
Potassium: 7.4 mmol/L (ref 3.5–5.1)
Sodium: 138 mmol/L (ref 135–145)
Total Bilirubin: 3.1 mg/dL — ABNORMAL HIGH (ref 0.0–1.2)
Total Protein: 8.1 g/dL (ref 6.5–8.1)

## 2024-03-30 LAB — TROPONIN I (HIGH SENSITIVITY): Troponin I (High Sensitivity): 10 ng/L (ref <18)

## 2024-03-30 LAB — RESP PANEL BY RT-PCR (RSV, FLU A&B, COVID)  RVPGX2
Influenza A by PCR: NEGATIVE
Influenza B by PCR: NEGATIVE
Resp Syncytial Virus by PCR: NEGATIVE
SARS Coronavirus 2 by RT PCR: NEGATIVE

## 2024-03-30 LAB — BETA-HYDROXYBUTYRIC ACID: Beta-Hydroxybutyric Acid: >8 mmol/L — ABNORMAL HIGH (ref 0.05–0.27)

## 2024-03-30 LAB — BLOOD GAS, VENOUS
Acid-base deficit: 27.3 mmol/L — ABNORMAL HIGH (ref 0.0–2.0)
Bicarbonate: 5.5 mmol/L — ABNORMAL LOW (ref 20.0–28.0)
O2 Saturation: 64.5 %
Patient temperature: 37
pCO2, Ven: 30 mmHg — ABNORMAL LOW (ref 44–60)
pH, Ven: <6.95 — CL (ref 7.25–7.43)
pO2, Ven: 44 mmHg (ref 32–45)

## 2024-03-30 LAB — GLUCOSE, CAPILLARY: Glucose-Capillary: >600 mg/dL (ref 70–99)

## 2024-03-30 LAB — BLOOD GAS, ARTERIAL
Acid-base deficit: 25.2 mmol/L — ABNORMAL HIGH (ref 0.0–2.0)
Bicarbonate: 2.1 mmol/L — ABNORMAL LOW (ref 20.0–28.0)
Drawn by: 11249
O2 Content: 6 L/min
O2 Saturation: 99.4 %
Patient temperature: 37.3
pCO2 arterial: <18 mmHg — CL (ref 32–48)
pH, Arterial: 7.09 — CL (ref 7.35–7.45)
pO2, Arterial: 166 mmHg — ABNORMAL HIGH (ref 83–108)

## 2024-03-30 LAB — ACETAMINOPHEN LEVEL: Acetaminophen (Tylenol), Serum: <10 ug/mL — ABNORMAL LOW (ref 10–30)

## 2024-03-30 LAB — LACTIC ACID, PLASMA: Lactic Acid, Venous: 3.3 mmol/L (ref 0.5–1.9)

## 2024-03-30 LAB — SALICYLATE LEVEL: Salicylate Lvl: <7 mg/dL — ABNORMAL LOW (ref 7.0–30.0)

## 2024-03-30 LAB — ETHANOL: Alcohol, Ethyl (B): <15 mg/dL (ref <15)

## 2024-03-30 LAB — AMMONIA: Ammonia: 38 umol/L — ABNORMAL HIGH (ref 9–35)

## 2024-03-30 MED ORDER — ROCURONIUM BROMIDE 10 MG/ML (PF) SYRINGE
PREFILLED_SYRINGE | INTRAVENOUS | Status: DC
Start: 2024-03-30 — End: 2024-03-30
  Filled 2024-03-30: qty 10

## 2024-03-30 MED ORDER — DEXTROSE 50 % IV SOLN: 0.0000 mL | INTRAVENOUS | Status: DC | PRN

## 2024-03-30 MED ORDER — ETOMIDATE 2 MG/ML IV SOLN
INTRAVENOUS | Status: AC
  Filled 2024-03-30: qty 20

## 2024-03-30 MED ORDER — MIDAZOLAM HCL 2 MG/2ML IJ SOLN
INTRAMUSCULAR | Status: AC
  Filled 2024-03-30: qty 2

## 2024-03-30 MED ORDER — ONDANSETRON HCL 4 MG/2ML IJ SOLN
4.0000 mg | Freq: Four times a day (QID) | INTRAMUSCULAR | Status: DC | PRN
  Administered 2024-03-31: 4 mg via INTRAVENOUS
  Filled 2024-03-30: qty 2

## 2024-03-30 MED ORDER — SODIUM CHLORIDE 0.9 % IV SOLN: INTRAVENOUS | Status: AC

## 2024-03-30 MED ORDER — DEXTROSE IN LACTATED RINGERS 5 % IV SOLN: INTRAVENOUS | Status: AC

## 2024-03-30 MED ORDER — CALCIUM GLUCONATE-NACL 2-0.675 GM/100ML-% IV SOLN
2.0000 g | Freq: Once | INTRAVENOUS | Status: AC
  Administered 2024-03-31: 2000 mg via INTRAVENOUS
  Filled 2024-03-30: qty 100

## 2024-03-30 MED ORDER — SODIUM CHLORIDE 0.9 % IV SOLN
2.0000 g | INTRAVENOUS | Status: DC
  Administered 2024-03-31 – 2024-04-03 (×5): 2 g via INTRAVENOUS
  Filled 2024-03-30 (×5): qty 20

## 2024-03-30 MED ORDER — LACTATED RINGERS IV BOLUS
2000.0000 mL | Freq: Once | INTRAVENOUS | Status: AC
  Administered 2024-03-30: 2000 mL via INTRAVENOUS

## 2024-03-30 MED ORDER — VANCOMYCIN HCL IN DEXTROSE 1-5 GM/200ML-% IV SOLN
1000.0000 mg | Freq: Once | INTRAVENOUS | Status: AC
  Administered 2024-03-31: 1000 mg via INTRAVENOUS
  Filled 2024-03-30: qty 200

## 2024-03-30 MED ORDER — CHLORHEXIDINE GLUCONATE CLOTH 2 % EX PADS
6.0000 | MEDICATED_PAD | Freq: Every day | CUTANEOUS | Status: DC
  Administered 2024-03-31 – 2024-04-02 (×3): 6 via TOPICAL

## 2024-03-30 MED ORDER — INSULIN REGULAR(HUMAN) IN NACL 100-0.9 UT/100ML-% IV SOLN
INTRAVENOUS | Status: DC
  Administered 2024-03-30: 6.5 [IU]/h via INTRAVENOUS
  Filled 2024-03-30 (×2): qty 100

## 2024-03-30 MED ORDER — LACTATED RINGERS IV SOLN: INTRAVENOUS | Status: DC

## 2024-03-30 MED ORDER — LACTATED RINGERS IV BOLUS
1000.0000 mL | INTRAVENOUS | Status: AC
  Administered 2024-03-30 (×2): 1000 mL via INTRAVENOUS

## 2024-03-30 MED ORDER — HEPARIN SODIUM (PORCINE) 5000 UNIT/ML IJ SOLN
5000.0000 [IU] | Freq: Three times a day (TID) | INTRAMUSCULAR | Status: DC
  Administered 2024-03-31 – 2024-04-03 (×11): 5000 [IU] via SUBCUTANEOUS
  Filled 2024-03-30 (×13): qty 1

## 2024-03-30 NOTE — H&P (Signed)
 NAME:  Edward Walls, MRN:  [id number], DOB:  December 10, 1980, LOS: 0 ADMISSION DATE:  03/30/2024, CONSULTATION DATE:  03/30/2024 REFERRING MD:  Curtistine Dawn, MD, CHIEF COMPLAINT:  DKA/AMS  History of Present Illness:  43 y/o with h/o DMT-I who was BIB EMS after they went there for a welfare check as family had not heard form him in a couple days.  EMS found him with GCS 7, and put him on 12 L NRM.  In the ED he was very Altered and was almost intubated but then seemed to be protecting his airways and so was not intubated.  Patient started on DKA pathway in ED. His PH <6.95, K 6.6, Glu >700, LA 3.3, Cr 2.40, WBC 18, cxr no infiltrates, urine no bacteria  Pertinent  Medical History  DMT1  Significant Hospital Events: Including procedures, antibiotic start and stop dates in addition to other pertinent events   6/15: admit to ICU  Interim History / Subjective:  N/a  Objective    Blood pressure (!) 141/71, pulse (!) 31, temperature 99.9 F (37.7 C), temperature source Rectal, resp. rate (!) 28, SpO2 100%.        Intake/Output Summary (Last 24 hours) at 03/30/2024 2212 Last data filed at 03/30/2024 2151 Gross per 24 hour  Intake 2000 ml  Output --  Net 2000 ml   There were no vitals filed for this visit.  Examination: General: awke but lethargic, Kussmaul breathing apparent HENT: PERRLA no icterus, dry mm Lungs: CTA no wheezes rales or rhonchi Cardiovascular: reg s1s2 no murmurs gallops or rubs Abdomen: soft nt nd bs pos no guarding Extremities: no cyanosis, clubbing or edema Neuro: awake, verbal with mumbling, protecting airways, responds to voice, non-focal   Resolved problem list   Assessment and Plan  AMS Secondary to Metabolic encephalopathy Will get head CT  DKA DKA protocol Q1-2 hr finger sticks Insulin  drip AKI Likely from dehydration Aggressive IV fluids Continue to monitor Cr levels Check CPK levels Severe Metabolic/Lactic Acidosis Should improve with  hydration and insulin  Monitor serial LA Dehydration Continue IV hydration Sepsis with unclear source Empiric antibiotics with de-escalation as possible Will get blood cultures Hyperkalemia Secondary to acidosis and should improve as his acidosis improved Not able to take po at this time May need Kayexalate enemas, Lokelma if mentation improves  Admit to ICU  Best Practice (right click and Reselect all SmartList Selections daily)   Diet/type: NPO DVT prophylaxis prophylactic heparin   Pressure ulcer(s): N/A GI prophylaxis: N/A Lines: N/A Foley:  N/A Code Status:  full code   Labs   CBC: Recent Labs  Lab 03/30/24 2040 03/30/24 2042  WBC 18.4*  --   NEUTROABS 14.7*  --   HGB 13.5 16.7  HCT 46.6 49.0  MCV 98.3  --   PLT 400  --     Basic Metabolic Panel: Recent Labs  Lab 03/30/24 2042  NA 135  K 6.6*  CL 103  GLUCOSE >700*  BUN 51*  CREATININE 2.40*   GFR: CrCl cannot be calculated (Unknown ideal weight.). Recent Labs  Lab 03/30/24 2040  WBC 18.4*  LATICACIDVEN 3.3*    Liver Function Tests: No results for input(s): AST, ALT, ALKPHOS, BILITOT, PROT, ALBUMIN in the last 168 hours. No results for input(s): LIPASE, AMYLASE in the last 168 hours. Recent Labs  Lab 03/30/24 2040  AMMONIA 38*    ABG    Component Value Date/Time   HCO3 5.5 (L) 03/30/2024 2040   TCO2 7 (L)  03/30/2024 2042   ACIDBASEDEF 27.3 (H) 03/30/2024 2040   O2SAT 64.5 03/30/2024 2040     Coagulation Profile: No results for input(s): INR, PROTIME in the last 168 hours.  Cardiac Enzymes: No results for input(s): CKTOTAL, CKMB, CKMBINDEX, TROPONINI in the last 168 hours.  HbA1C: No results found for: HGBA1C  CBG: Recent Labs  Lab 03/30/24 2023 03/30/24 2102 03/30/24 2138  GLUCAP >600* >600* >600*    Review of Systems:   Unable to obtain, AMS  Past Medical History:  He,  has no past medical history on file.   Surgical History:  The  histories are not reviewed yet. Please review them in the History navigator section and refresh this SmartLink.   Social History:      Family History:  His family history is not on file.   Allergies Not on File   Home Medications  Prior to Admission medications   Not on File     Critical care time: 30   The patient is critically ill with multiple organ system failure and requires high complexity decision making for assessment and support, frequent evaluation and titration of therapies, advanced monitoring, review of radiographic studies and interpretation of complex data.   Critical Care Time devoted to patient care services, exclusive of separately billable procedures, described in this note is 34 minutes.   Orlin Fairly, MD [facility] See Amion for pager  If no response to pager , please call [phone] until 7pm After 7:00 pm call Elink  [phone] 03/30/2024, 10:13 PM

## 2024-03-30 NOTE — Progress Notes (Signed)
 eLink Physician-Brief Progress Note Patient Name: Edward Walls DOB: October 01, 1981 MRN: [id number]   Date of Service  03/30/2024  HPI/Events of Note  43 year old male with type 1 diabetes brought in by EMS for encephalopathy found to have severe diabetic ketoacidosis, acute kidney injury, and severe lactic acidosis in the setting of sepsis of unclear etiology.  Patient is tachypneic, tachycardic, and hypertensive.  Saturating 100% on 3 L of oxygen.  Results show severe acidemia, anion gap metabolic acidosis with elevated creatinine, hyperglycemia.  CT head with no critical findings on my review.  eICU Interventions  DKA protocol-insulin  drip, fluids, and electrolyte repletion.  Hyperkalemia addressed-maintain insulin  infusion.  Will add calcium   Empiric antibiotics  DVT prophylaxis with heparin  GI prophylaxis not indicated     Intervention Category Evaluation Type: New Patient Evaluation  [name] 03/30/2024, 11:42 PM

## 2024-03-30 NOTE — ED Triage Notes (Addendum)
 Pt presents to ED from home via EMS for hyperglycemia. EMS was called out for welfare check. Pt found with GCS of 7 and blood sugar reading high. EMS placed NPA in R nare. EMS administered 800mL NS and placed pt on 12L nonrebrether. EMS states family reports pt is type 1 diabetic

## 2024-03-30 NOTE — ED Provider Notes (Signed)
 I provided a substantive portion of the care of this patient.  I personally made/approved the management plan for this patient and take responsibility for the patient management.      Patient presented with altered mental status and history of type 1 diabetes.  Patient has been seen for over 24 hours.  Patient has DKA here based on labs and x-rays.  Initially patient was considered for intubation but he was able to wake up and was alert and oriented x 3.  Protecting his airway.  Patient has been started on DKA pathway.  This included aggressive IV hydration as well as insulin  drip.  His labs were significant for acute kidney injury as well as mild hyperkalemia which will be treated with insulin .  He is severely acidotic on his labs.  Was reassessed multiple times and no indication for emergent admission at this time.  Case has been discussed with critical care and patient to be admitted  CRITICAL CARE Performed by: Curtistine ONEIDA Dawn Total critical care time: 50 minutes Critical care time was exclusive of separately billable procedures and treating other patients. Critical care was necessary to treat or prevent imminent or life-threatening deterioration. Critical care was time spent personally by me on the following activities: development of treatment plan with patient and/or surrogate as well as nursing, discussions with consultants, evaluation of patient's response to treatment, examination of patient, obtaining history from patient or surrogate, ordering and performing treatments and interventions, ordering and review of laboratory studies, ordering and review of radiographic studies, pulse oximetry and re-evaluation of patient's condition.    Dawn Curtistine, MD 03/30/24 2140

## 2024-03-31 ENCOUNTER — Other Ambulatory Visit: Payer: Self-pay

## 2024-03-31 DIAGNOSIS — E7220 Disorder of urea cycle metabolism, unspecified: Secondary | ICD-10-CM

## 2024-03-31 LAB — MAGNESIUM
Magnesium: 3 mg/dL — ABNORMAL HIGH (ref 1.7–2.4)
Magnesium: 1.8 mg/dL (ref 1.7–2.4)
Magnesium: 2.1 mg/dL (ref 1.7–2.4)

## 2024-03-31 LAB — CBC
HCT: 45.3 % (ref 39.0–52.0)
HCT: 45.6 % (ref 39.0–52.0)
Hemoglobin: 13.9 g/dL (ref 13.0–17.0)
Hemoglobin: 14.6 g/dL (ref 13.0–17.0)
MCH: 28.1 pg (ref 26.0–34.0)
MCH: 28.3 pg (ref 26.0–34.0)
MCHC: 30.7 g/dL (ref 30.0–36.0)
MCHC: 32 g/dL (ref 30.0–36.0)
MCV: 91.7 fL (ref 80.0–100.0)
MCV: 88.4 fL (ref 80.0–100.0)
Platelets: 360 10*3/uL (ref 150–400)
Platelets: 286 10*3/uL (ref 150–400)
RBC: 4.94 MIL/uL (ref 4.22–5.81)
RBC: 5.16 MIL/uL (ref 4.22–5.81)
RDW: 13.4 % (ref 11.5–15.5)
RDW: 13.4 % (ref 11.5–15.5)
WBC: 16.4 10*3/uL — ABNORMAL HIGH (ref 4.0–10.5)
WBC: 14.5 10*3/uL — ABNORMAL HIGH (ref 4.0–10.5)
nRBC: 0 % (ref 0.0–0.2)
nRBC: 0 % (ref 0.0–0.2)

## 2024-03-31 LAB — BASIC METABOLIC PANEL WITH GFR
Anion gap: 26 — ABNORMAL HIGH (ref 5–15)
Anion gap: 16 — ABNORMAL HIGH (ref 5–15)
Anion gap: 12 (ref 5–15)
Anion gap: 15 (ref 5–15)
BUN: 37 mg/dL — ABNORMAL HIGH (ref 6–20)
BUN: 26 mg/dL — ABNORMAL HIGH (ref 6–20)
BUN: 29 mg/dL — ABNORMAL HIGH (ref 6–20)
BUN: 26 mg/dL — ABNORMAL HIGH (ref 6–20)
CO2: 11 mmol/L — ABNORMAL LOW (ref 22–32)
CO2: 15 mmol/L — ABNORMAL LOW (ref 22–32)
CO2: 21 mmol/L — ABNORMAL LOW (ref 22–32)
CO2: 20 mmol/L — ABNORMAL LOW (ref 22–32)
Calcium: 9.8 mg/dL (ref 8.9–10.3)
Calcium: 8.7 mg/dL — ABNORMAL LOW (ref 8.9–10.3)
Calcium: 9.1 mg/dL (ref 8.9–10.3)
Calcium: 9.2 mg/dL (ref 8.9–10.3)
Chloride: 110 mmol/L (ref 98–111)
Chloride: 115 mmol/L — ABNORMAL HIGH (ref 98–111)
Chloride: 116 mmol/L — ABNORMAL HIGH (ref 98–111)
Chloride: 114 mmol/L — ABNORMAL HIGH (ref 98–111)
Creatinine, Ser: 2.04 mg/dL — ABNORMAL HIGH (ref 0.61–1.24)
Creatinine, Ser: 1.08 mg/dL (ref 0.61–1.24)
Creatinine, Ser: 1.12 mg/dL (ref 0.61–1.24)
Creatinine, Ser: 1.16 mg/dL (ref 0.61–1.24)
GFR, Estimated: 41 mL/min — ABNORMAL LOW (ref >60)
GFR, Estimated: >60 mL/min (ref >60)
GFR, Estimated: >60 mL/min (ref >60)
GFR, Estimated: >60 mL/min (ref >60)
Glucose, Bld: 450 mg/dL — ABNORMAL HIGH (ref 70–99)
Glucose, Bld: 860 mg/dL (ref 70–99)
Glucose, Bld: 173 mg/dL — ABNORMAL HIGH (ref 70–99)
Glucose, Bld: 239 mg/dL — ABNORMAL HIGH (ref 70–99)
Potassium: 4.9 mmol/L (ref 3.5–5.1)
Potassium: 4 mmol/L (ref 3.5–5.1)
Potassium: 3.8 mmol/L (ref 3.5–5.1)
Potassium: 4.1 mmol/L (ref 3.5–5.1)
Sodium: 147 mmol/L — ABNORMAL HIGH (ref 135–145)
Sodium: 146 mmol/L — ABNORMAL HIGH (ref 135–145)
Sodium: 149 mmol/L — ABNORMAL HIGH (ref 135–145)
Sodium: 149 mmol/L — ABNORMAL HIGH (ref 135–145)

## 2024-03-31 LAB — GLUCOSE, CAPILLARY
Glucose-Capillary: 542 mg/dL (ref 70–99)
Glucose-Capillary: 486 mg/dL — ABNORMAL HIGH (ref 70–99)
Glucose-Capillary: 502 mg/dL (ref 70–99)
Glucose-Capillary: 426 mg/dL — ABNORMAL HIGH (ref 70–99)
Glucose-Capillary: 270 mg/dL — ABNORMAL HIGH (ref 70–99)
Glucose-Capillary: 237 mg/dL — ABNORMAL HIGH (ref 70–99)
Glucose-Capillary: 162 mg/dL — ABNORMAL HIGH (ref 70–99)
Glucose-Capillary: 152 mg/dL — ABNORMAL HIGH (ref 70–99)
Glucose-Capillary: 210 mg/dL — ABNORMAL HIGH (ref 70–99)
Glucose-Capillary: 222 mg/dL — ABNORMAL HIGH (ref 70–99)
Glucose-Capillary: 181 mg/dL — ABNORMAL HIGH (ref 70–99)
Glucose-Capillary: 187 mg/dL — ABNORMAL HIGH (ref 70–99)
Glucose-Capillary: 200 mg/dL — ABNORMAL HIGH (ref 70–99)
Glucose-Capillary: 190 mg/dL — ABNORMAL HIGH (ref 70–99)
Glucose-Capillary: 221 mg/dL — ABNORMAL HIGH (ref 70–99)
Glucose-Capillary: 169 mg/dL — ABNORMAL HIGH (ref 70–99)
Glucose-Capillary: 173 mg/dL — ABNORMAL HIGH (ref 70–99)
Glucose-Capillary: 175 mg/dL — ABNORMAL HIGH (ref 70–99)
Glucose-Capillary: 186 mg/dL — ABNORMAL HIGH (ref 70–99)
Glucose-Capillary: 267 mg/dL — ABNORMAL HIGH (ref 70–99)

## 2024-03-31 LAB — BLOOD GAS, VENOUS
Acid-base deficit: 10 mmol/L — ABNORMAL HIGH (ref 0.0–2.0)
Bicarbonate: 16.2 mmol/L — ABNORMAL LOW (ref 20.0–28.0)
O2 Saturation: 51 %
Patient temperature: 36.6
pCO2, Ven: 35 mmHg — ABNORMAL LOW (ref 44–60)
pH, Ven: 7.27 (ref 7.25–7.43)
pO2, Ven: <31 mmHg — CL (ref 32–45)

## 2024-03-31 LAB — MRSA NEXT GEN BY PCR, NASAL: MRSA by PCR Next Gen: NOT DETECTED

## 2024-03-31 LAB — TSH: TSH: 0.436 u[IU]/mL (ref 0.350–4.500)

## 2024-03-31 LAB — PROCALCITONIN: Procalcitonin: 6.88 ng/mL

## 2024-03-31 LAB — RAPID URINE DRUG SCREEN, HOSP PERFORMED
Amphetamines: NOT DETECTED
Barbiturates: NOT DETECTED
Benzodiazepines: NOT DETECTED
Cocaine: NOT DETECTED
Opiates: NOT DETECTED
Tetrahydrocannabinol: NOT DETECTED

## 2024-03-31 LAB — TROPONIN I (HIGH SENSITIVITY): Troponin I (High Sensitivity): 13 ng/L (ref <18)

## 2024-03-31 LAB — LACTIC ACID, PLASMA
Lactic Acid, Venous: 2.2 mmol/L (ref 0.5–1.9)
Lactic Acid, Venous: 2.1 mmol/L (ref 0.5–1.9)

## 2024-03-31 LAB — HIV ANTIBODY (ROUTINE TESTING W REFLEX): HIV Screen 4th Generation wRfx: NONREACTIVE

## 2024-03-31 LAB — CK: Total CK: 238 U/L (ref 49–397)

## 2024-03-31 LAB — PHOSPHORUS: Phosphorus: 1.6 mg/dL — ABNORMAL LOW (ref 2.5–4.6)

## 2024-03-31 LAB — BETA-HYDROXYBUTYRIC ACID: Beta-Hydroxybutyric Acid: 4.77 mmol/L — ABNORMAL HIGH (ref 0.05–0.27)

## 2024-03-31 LAB — OSMOLALITY: Osmolality: 369 mosm/kg (ref 275–295)

## 2024-03-31 MED ORDER — VANCOMYCIN HCL 1250 MG/250ML IV SOLN: 1250.0000 mg | INTRAVENOUS | Status: DC

## 2024-03-31 MED ORDER — LACTATED RINGERS IV BOLUS
500.0000 mL | Freq: Once | INTRAVENOUS | Status: AC
  Administered 2024-03-31: 500 mL via INTRAVENOUS

## 2024-03-31 MED ORDER — SODIUM CHLORIDE 0.9 % IV SOLN
8.0000 mg | Freq: Four times a day (QID) | INTRAVENOUS | Status: DC | PRN
  Administered 2024-04-02: 8 mg via INTRAVENOUS
  Filled 2024-03-31: qty 8
  Filled 2024-03-31: qty 4

## 2024-03-31 MED ORDER — INSULIN ASPART 100 UNIT/ML IJ SOLN
2.0000 [IU] | INTRAMUSCULAR | Status: DC
  Administered 2024-03-31: 4 [IU] via SUBCUTANEOUS

## 2024-03-31 MED ORDER — LABETALOL HCL 5 MG/ML IV SOLN
10.0000 mg | INTRAVENOUS | Status: DC | PRN
  Administered 2024-03-31: 10 mg via INTRAVENOUS
  Filled 2024-03-31: qty 4

## 2024-03-31 MED ORDER — PANTOPRAZOLE SODIUM 40 MG PO TBEC
40.0000 mg | DELAYED_RELEASE_TABLET | Freq: Every day | ORAL | Status: DC
  Filled 2024-03-31: qty 1

## 2024-03-31 MED ORDER — PROCHLORPERAZINE EDISYLATE 10 MG/2ML IJ SOLN
10.0000 mg | INTRAMUSCULAR | Status: DC | PRN
  Administered 2024-03-31 – 2024-04-03 (×6): 10 mg via INTRAVENOUS
  Filled 2024-03-31 (×6): qty 2

## 2024-03-31 MED ORDER — PANTOPRAZOLE SODIUM 40 MG IV SOLR
40.0000 mg | Freq: Every day | INTRAVENOUS | Status: DC
  Administered 2024-03-31 – 2024-04-02 (×3): 40 mg via INTRAVENOUS
  Filled 2024-03-31 (×3): qty 10

## 2024-03-31 MED ORDER — INSULIN GLARGINE-YFGN 100 UNIT/ML ~~LOC~~ SOLN
20.0000 [IU] | Freq: Two times a day (BID) | SUBCUTANEOUS | Status: DC
Start: 2024-03-31 — End: 2024-04-01
  Administered 2024-03-31: 20 [IU] via SUBCUTANEOUS
  Filled 2024-03-31 (×3): qty 0.2

## 2024-03-31 MED ORDER — ESCITALOPRAM OXALATE 10 MG PO TABS
10.0000 mg | ORAL_TABLET | Freq: Every day | ORAL | Status: DC
  Administered 2024-04-03 (×2): 10 mg via ORAL
  Filled 2024-03-31 (×3): qty 1

## 2024-03-31 MED ORDER — SODIUM CHLORIDE 0.9 % IV SOLN
8.0000 mg | Freq: Four times a day (QID) | INTRAVENOUS | Status: DC | PRN
  Administered 2024-03-31: 8 mg via INTRAVENOUS
  Filled 2024-03-31: qty 4

## 2024-03-31 NOTE — Progress Notes (Addendum)
 NAME:  Edward Walls, MRN:  [id number], DOB:  May 24, 1981, LOS: 1 ADMISSION DATE:  03/30/2024, CONSULTATION DATE:  03/30/2024 REFERRING MD:  Curtistine Dawn, MD, CHIEF COMPLAINT:  DKA/AMS  History of Present Illness:  43 y/o with h/o DMT-I who was BIB EMS after they went there for a welfare check as family had not heard form him in a couple days.  EMS found him with GCS 7, and put him on 12 L NRM.  In the ED he was very Altered and was almost intubated but then seemed to be protecting his airways and so was not intubated.  Patient started on DKA pathway in ED. His PH <6.95, K 6.6, Glu >700, LA 3.3, Cr 2.40, WBC 18, cxr no infiltrates, urine no bacteria  Pertinent  Medical History  DMT1  Significant Hospital Events: Including procedures, antibiotic start and stop dates in addition to other pertinent events   6/15: admit to ICU w/ DKA  Interim History / Subjective:  Mild lethargy but arouses and Aox3; mae  Objective    Blood pressure (!) 140/76, pulse (!) 122, temperature 97.6 F (36.4 C), temperature source Axillary, resp. rate 17, height 6' 3 (1.905 m), weight 71.8 kg, SpO2 99%.        Intake/Output Summary (Last 24 hours) at 03/31/2024 0814 Last data filed at 03/31/2024 0748 Gross per 24 hour  Intake 3147.48 ml  Output 600 ml  Net 2547.48 ml   Filed Weights   03/30/24 2355 03/31/24 9391  Weight: 72 kg 71.8 kg    Examination: General:  NAD HEENT: MM pink/dry Neuro: mild lethargy; aox3; mae CV: s1s2, tachy 120s, no m/r/g PULM:  dim clear BS bilaterally GI: soft, bsx4 active  Extremities: warm/dry, no edema    Resolved problem list   Assessment and Plan   DKA Plan: -Insulin  per endotool -CBG monitoring -Aggressive IV fluid resuscitation -trend B-Hydroxy -serial bmp -check A1c -prn zofran  for nausea  Acute encephalopathy -presumed 2/2 to DKA; ct head no acute abnormality; ethanol, acetaminophen , salicylate level wnl Mild hyperammonemia Plan: -treat for  dka as above -limit sedating meds -uds pending -trend ammonia; consider lactulose  AKI Hyperkalemia Plan: -iv fluids -ck wnl -Trend BMP / urinary output -Replace electrolytes as indicated -Avoid nephrotoxic agents, ensure adequate renal perfusion  Sepsis: unclear source Plan: -check pct -currently on broad spectrum abx -follow cultures  Hx of esophagitis Plan: -PPI  Hx of depression Plan: -lexapro   Hx of peripheral neuropathy Plan: -hold home gabapentin given poor mental status   Best Practice (right click and Reselect all SmartList Selections daily)   Diet/type: NPO DVT prophylaxis prophylactic heparin   Pressure ulcer(s): N/A GI prophylaxis: PPI Lines: N/A Foley:  N/A Code Status:  full code  GOC: 6/16 updated mother over phone  Labs   CBC: Recent Labs  Lab 03/30/24 2040 03/30/24 2042 03/31/24 0303  WBC 18.4*  --  16.4*  NEUTROABS 14.7*  --   --   HGB 13.5 16.7 13.9  HCT 46.6 49.0 45.3  MCV 98.3  --  91.7  PLT 400  --  360    Basic Metabolic Panel: Recent Labs  Lab 03/30/24 2042 03/30/24 2243 03/31/24 0303 03/31/24 0305  NA 135 138 147*  --   K 6.6* 7.4* 4.9  --   CL 103 100 110  --   CO2  --  <7* 11*  --   GLUCOSE >700* 857* 450*  --   BUN 51* 42* 37*  --   CREATININE  2.40* 2.76* 2.04*  --   CALCIUM   --  8.8* 9.8  --   MG  --   --   --  3.0*   GFR: Estimated Creatinine Clearance: 48.4 mL/min (A) (by C-G formula based on SCr of 2.04 mg/dL (H)). Recent Labs  Lab 03/30/24 2040 03/31/24 0303 03/31/24 0532  WBC 18.4* 16.4*  --   LATICACIDVEN 3.3* 2.2* 2.1*    Liver Function Tests: Recent Labs  Lab 03/30/24 2243  AST 26  ALT 23  ALKPHOS 128*  BILITOT 3.1*  PROT 8.1  ALBUMIN 4.0   No results for input(s): LIPASE, AMYLASE in the last 168 hours. Recent Labs  Lab 03/30/24 2040  AMMONIA 38*    ABG    Component Value Date/Time   PHART 7.09 (LL) 03/30/2024 2215   PCO2ART <18 (LL) 03/30/2024 2215   PO2ART 166 (H)  03/30/2024 2215   HCO3 16.2 (L) 03/31/2024 0532   TCO2 7 (L) 03/30/2024 2042   ACIDBASEDEF 10.0 (H) 03/31/2024 0532   O2SAT 51 03/31/2024 0532     Coagulation Profile: No results for input(s): INR, PROTIME in the last 168 hours.  Cardiac Enzymes: Recent Labs  Lab 03/31/24 0305  CKTOTAL 238    HbA1C: No results found for: HGBA1C  CBG: Recent Labs  Lab 03/31/24 0246 03/31/24 0343 03/31/24 0546 03/31/24 0625 03/31/24 0738  GLUCAP 486* 426* 270* 237* 162*    Review of Systems:   Unable to obtain, AMS  Past Medical History:  He,  has no past medical history on file.   Surgical History:  The histories are not reviewed yet. Please review them in the History navigator section and refresh this SmartLink.   Social History:      Family History:  His family history is not on file.   Allergies Not on File   Home Medications  Prior to Admission medications   Not on File     Critical care time: 35 minutes    JD Emilio RIGGERS [facility] 03/31/2024, 9:34 AM  Please see [email or web address] for pager details.  From 7A-7P if no response, please call [phone]. After hours, please call ELink [phone].

## 2024-03-31 NOTE — Progress Notes (Signed)
   03/31/24 1500  Spiritual Encounters  Type of Visit Attempt (pt unavailable)  Care provided to: Pt not available  Reason for visit Routine spiritual support  OnCall Visit No    Chaplain checked in while rounding - Pt was sleeping. Chaplains remain available if needed.

## 2024-03-31 NOTE — Plan of Care (Signed)

## 2024-03-31 NOTE — ED Provider Notes (Signed)
 [facility]-ICU/STEPDOWN Provider Note   CSN: [id number] Arrival date & time: 03/30/24  2018     Patient presents with: Hyperglycemia   Edward Walls is a 43 y.o. male with h/o Type 1 diabetes presents to the ER today for evaluation.  Patient initially came in as a Edward Walls as they could not find his information in the system.  Was told by EMS that he has a history of type 1 diabetes.  Patient is not able to provide a history given medical state.  When EMS arrived they found the patient with a GCS of 7 and blood sugar was reading high.  They placed an NPA in the right nare and has ministered 800 mL normal saline to place the patient on a 12 L nonrebreather.  EMS did report family is on the way and the only history they mention was type 1 diabetes.  Level 5 caveat due to patient's Edward Walls status as well as medical condition.  Later on, we were able to use [name] as the patient did have his phone to call his mother.  We were able to get his date of birth and correct information to find his account.  His mother was not the one to call 911 however she did give me the information for her son, Edward Walls.  She does not know of any known allergies for the patient or other medical problems.  I spoke with Edward Walls and the patient's friend Edward Walls on the phone.  Edward Walls was the one that called 911.  He reports that he had talked to her the patient earlier this morning and had just sounded really fatigued.  When he was called again to check up on him him and he did not answer, he became concerned.  He reports that he drove the patient's house and found him lying on the bed breathing weird.  He called 911.  Edward Walls reports that he had seen him 2 days ago.  They report that he works weird hours and do not know any other medical problems or if he is consistent with his diabetes medications.  Hyperglycemia      Prior to Admission medications   Not on File    Allergies: Patient has no  allergy information on record.    Review of Systems  Unable to perform ROS: Patient unresponsive    Updated Vital Signs BP (!) 160/89   Pulse (!) 124   Temp 97.7 F (36.5 C) (Oral)   Resp (!) 28   Ht 6' 3 (1.905 m)   Wt 72 kg   SpO2 100%   BMI 19.84 kg/m   Physical Exam Vitals and nursing note reviewed. Exam conducted with a chaperone present.  Constitutional:      Appearance: He is ill-appearing.     Comments: Patient is ill-appearing.  Thin.  Not diaphoretic.  HENT:     Mouth/Throat:     Mouth: Mucous membranes are dry.     Comments: Very dry mucous membranes.  He does have a darkened tongue.  Eyes:     General: No scleral icterus.    Pupils: Pupils are equal, round, and reactive to light.    Cardiovascular:     Rate and Rhythm: Tachycardia present.     Pulses:          Radial pulses are 2+ on the right side and 2+ on the left side.       Dorsalis pedis pulses are 2+ on the right  side and 2+ on the left side.       Posterior tibial pulses are 2+ on the right side and 2+ on the left side.  Pulmonary:     Breath sounds: No wheezing, rhonchi or rales.     Comments: Kussmaul breathing, on NRB with NPA in R nare Abdominal:     Palpations: Abdomen is soft.  Genitourinary:    Comments: No induration or fluctuance or overlying skin changes noted to the perineum  Musculoskeletal:     Right lower leg: No edema.     Left lower leg: No edema.     Comments: Patient does have some slight bruising seen to the right lower leg but no deformity.   Skin:    General: Skin is warm and dry.   Neurological:     Comments: Patient initially minimally responsive to painful stimuli however would wake up and try to take off mask.  Could say his name and that he lived in [place].  Does appear confused.  He is spontaneously moving all extremities.    (all labs ordered are listed, but only abnormal results are displayed) Labs Reviewed  CBC WITH DIFFERENTIAL/PLATELET - Abnormal;  Notable for the following components:      Result Value   WBC 18.4 (*)    MCHC 29.0 (*)    Neutro Abs 14.7 (*)    Monocytes Absolute 2.7 (*)    Abs Immature Granulocytes 0.23 (*)    All other components within normal limits  LACTIC ACID, PLASMA - Abnormal; Notable for the following components:   Lactic Acid, Venous 3.3 (*)    All other components within normal limits  BLOOD GAS, VENOUS - Abnormal; Notable for the following components:   pH, Ven <6.95 (*)    pCO2, Ven 30 (*)    Bicarbonate 5.5 (*)    Acid-base deficit 27.3 (*)    All other components within normal limits  BETA-HYDROXYBUTYRIC ACID - Abnormal; Notable for the following components:   Beta-Hydroxybutyric Acid >8.00 (*)    All other components within normal limits  ACETAMINOPHEN  LEVEL - Abnormal; Notable for the following components:   Acetaminophen  (Tylenol ), Serum <10 (*)    All other components within normal limits  SALICYLATE LEVEL - Abnormal; Notable for the following components:   Salicylate Lvl <7.0 (*)    All other components within normal limits  AMMONIA - Abnormal; Notable for the following components:   Ammonia 38 (*)    All other components within normal limits  URINALYSIS, W/ REFLEX TO CULTURE (INFECTION SUSPECTED) - Abnormal; Notable for the following components:   Glucose, UA >=500 (*)    Hgb urine dipstick MODERATE (*)    Ketones, ur 80 (*)    Protein, ur 100 (*)    All other components within normal limits  BLOOD GAS, ARTERIAL - Abnormal; Notable for the following components:   pH, Arterial 7.09 (*)    pCO2 arterial <18 (*)    pO2, Arterial 166 (*)    Bicarbonate 2.1 (*)    Acid-base deficit 25.2 (*)    All other components within normal limits  COMPREHENSIVE METABOLIC PANEL WITH GFR - Abnormal; Notable for the following components:   Potassium 7.4 (*)    CO2 <7 (*)    Glucose, Bld 857 (*)    BUN 42 (*)    Creatinine, Ser 2.76 (*)    Calcium  8.8 (*)    Alkaline Phosphatase 128 (*)    Total  Bilirubin 3.1 (*)  GFR, Estimated 29 (*)    All other components within normal limits  GLUCOSE, CAPILLARY - Abnormal; Notable for the following components:   Glucose-Capillary >600 (*)    All other components within normal limits  I-STAT CHEM 8, ED - Abnormal; Notable for the following components:   Potassium 6.6 (*)    BUN 51 (*)    Creatinine, Ser 2.40 (*)    Glucose, Bld >700 (*)    Calcium , Ion 1.10 (*)    TCO2 7 (*)    All other components within normal limits  CBG MONITORING, ED - Abnormal; Notable for the following components:   Glucose-Capillary >600 (*)    All other components within normal limits  CBG MONITORING, ED - Abnormal; Notable for the following components:   Glucose-Capillary >600 (*)    All other components within normal limits  CBG MONITORING, ED - Abnormal; Notable for the following components:   Glucose-Capillary >600 (*)    All other components within normal limits  CBG MONITORING, ED - Abnormal; Notable for the following components:   Glucose-Capillary >600 (*)    All other components within normal limits  CBG MONITORING, ED - Abnormal; Notable for the following components:   Glucose-Capillary >600 (*)    All other components within normal limits  RESP PANEL BY RT-PCR (RSV, FLU A&B, COVID)  RVPGX2  CULTURE, BLOOD (ROUTINE X 2)  CULTURE, BLOOD (ROUTINE X 2)  MRSA NEXT GEN BY PCR, NASAL  ETHANOL  LACTIC ACID, PLASMA  BASIC METABOLIC PANEL WITH GFR  BASIC METABOLIC PANEL WITH GFR  BASIC METABOLIC PANEL WITH GFR  RAPID URINE DRUG SCREEN, HOSP PERFORMED  HIV ANTIBODY (ROUTINE TESTING W REFLEX)  CBC  CBC  MAGNESIUM   PHOSPHORUS  CK  MAGNESIUM   MAGNESIUM   MAGNESIUM   MAGNESIUM   TSH  OSMOLALITY  BASIC METABOLIC PANEL WITH GFR  MAGNESIUM   MAGNESIUM   MAGNESIUM   POC OCCULT BLOOD, ED  TROPONIN I (HIGH SENSITIVITY)  TROPONIN I (HIGH SENSITIVITY)    EKG: None  Radiology: CT HEAD WO CONTRAST ( ) Result Date: 03/30/2024 EXAM: CT HEAD WITHOUT  CONTRAST 03/30/2024 11:29:06 PM TECHNIQUE: CT of the head was performed without the administration of intravenous contrast. Automated exposure control, iterative reconstruction, and/or weight based adjustment of the mA/kV was utilized to reduce the radiation dose to as low as reasonably achievable. COMPARISON: 06/04/2020 CLINICAL HISTORY: Mental status change, persistent or worsening. AMS, hyperglycemia. EMS was called out for welfare check. Pt found with GCS of 7 and blood sugar reading high. FINDINGS: BRAIN AND VENTRICLES: There is no acute intracranial hemorrhage, mass effect or midline shift. No abnormal extra-axial fluid collection. The gray-white differentiation is maintained without an acute infarct. There is no hydrocephalus. ORBITS: The visualized portion of the orbits demonstrate no acute abnormality. SINUSES: The visualized paranasal sinuses and mastoid air cells demonstrate no acute abnormality. SOFT TISSUES AND SKULL: No acute abnormality of the visualized skull or soft tissues. IMPRESSION: 1. No acute intracranial abnormality. Electronically signed by: Pinkie Pebbles MD 03/30/2024 11:45 PM EDT RP Workstation: [id number]   DG Chest Portable 1 View Result Date: 03/30/2024 CLINICAL DATA:  DKA, found down EXAM: PORTABLE CHEST 1 VIEW COMPARISON:  05/16/2020 FINDINGS: The heart size and mediastinal contours are within normal limits. Both lungs are clear. The visualized skeletal structures are unremarkable. IMPRESSION: No active disease. Electronically Signed   By: Ozell Daring M.D.   On: 03/30/2024 21:34   .Critical Care  Performed by: Bernis Ernst, PA-C Authorized by: Bernis Ernst, PA-C  Critical care provider statement:    Critical care time (minutes):  60   Critical care was necessary to treat or prevent imminent or life-threatening deterioration of the following conditions:  Metabolic crisis   Critical care was time spent personally by me on the following activities:  Development of  treatment plan with patient or surrogate, discussions with consultants, evaluation of patient's response to treatment, examination of patient, ordering and review of laboratory studies, ordering and review of radiographic studies, ordering and performing treatments and interventions, pulse oximetry, re-evaluation of patient's condition, review of old charts and obtaining history from patient or surrogate   Care discussed with: admitting provider      Medications Ordered in the ED  insulin  regular, human (MYXREDLIN ) 100 units/ 100 mL infusion (6.5 Units/hr Intravenous Rate/Dose Verify 03/30/24 2302)  dextrose  5 % in lactated ringers  infusion (0 mLs Intravenous Hold 03/30/24 2111)  dextrose  50 % solution 0-50 mL (has no administration in time range)  etomidate (AMIDATE) 2 MG/ML injection (  Not Given 03/30/24 2254)  heparin  injection 5,000 Units (has no administration in time range)  ondansetron  (ZOFRAN ) injection 4 mg (has no administration in time range)  0.9 %  sodium chloride  infusion ( Intravenous New Bag/Given 03/31/24 0014)  Chlorhexidine  Gluconate Cloth 2 % PADS 6 each (has no administration in time range)  cefTRIAXone  (ROCEPHIN ) 2 g in sodium chloride  0.9 % 100 mL IVPB (has no administration in time range)  calcium  gluconate 2 g/ 100 mL sodium chloride  IVPB (2,000 mg Intravenous New Bag/Given 03/31/24 0015)  vancomycin  (VANCOCIN ) IVPB 1000 mg/200 mL premix (has no administration in time range)  lactated ringers  bolus 2,000 mL (0 mLs Intravenous Stopped 03/30/24 2151)  lactated ringers  bolus 1,000 mL (1,000 mLs Intravenous New Bag/Given 03/30/24 2223)    Clinical Course as of 03/31/24 0017  Sun Mar 30, 2024  2039 Rectal temp 99.53F [RR]    Clinical Course User Index [RR] Bernis Ernst, PA-C   Medical Decision Making Amount and/or Complexity of Data Reviewed Labs: ordered. Radiology: ordered.  Risk Prescription drug management. Decision regarding hospitalization.   43 y.o. male  presents to the ER for evaluation of hyperglycemia. Differential diagnosis includes but is not limited to infection, hyperglycemia, HHS, DKA. Vital signs patient mildly hypertensive at 141/71, tachycardic in the 120s to 130s, respiratory rate in the high 20s with 100% on nonrebreather.  Rectal temperature at 99.9. Physical exam as noted above.   Given patient's Edward Walls status, unable to review previous charts.  Fluids and insulin  were promptly ordered.  I-STAT Chem-8 collected as well as multiple other labs.  Broad differential given limited information.  Patient was initially considered for intubation by my attending however he was able to wake up and would be a alert.  He is protecting his airway.  Will obtain chest x-ray.  I independently reviewed and interpreted the patient's labs.  I-STAT Chem-8 shows potassium 6.6.  Glucose greater than 700.  Creatinine at 2.40 with a BUN of 51.  He was in hematocrit within normal limits.  Discussed potassium level with my attending, does not recommend any additional medications for now and would just like to continue with the fluids and insulin .  CBC shows leukocytosis of 18.4 with a left shift.  No anemia.  Elevated monocyte count and absolute immature granulocytes as well.  CMP shows potassium 7.4, bicarb less than 7, glucose 857 with a BUN of 42.  Creatinine of 2.76 and a calcium  of 8.8.  Elevated alk phos  at 128 and total bili at 3.1.  Troponin at 10, lactic acid at 3.3.  Urinalysis shows greater than 500 glucose present with moderate hemoglobin.  80 ketones and 100 protein otherwise unremarkable.  VBG shows pH less than 6.95.  Shows bicarbonate of 5.5.  Pressure panel negative for COVID, flu, RSV.  Ammonia elevated at 38.  Beta-hydroxybutyrate acid is greater than 8.  Negative for ethanol, acetaminophen , and salicylate.  ABG shows pH 7.09 with PCO2 less than 18.  PO2 at 166.  Chest x-ray is unremarkable per radiologist review.  After discussion with my  attending, patient does not appear clinically septic.  Do not feel antibiotics need to be started at this time.  Likely having electrolyte and lab abnormalities secondary to his hyperglycemia and DKA given now that we have previous chart of evaluation showing multiple DKA episodes previously.  His urine is unremarkable for infection and his chest x-ray is clear.  He is afebrile. He has been getting insulin  and IV fluids.  On multiple re-evaluations, he is becoming more lucid however is still confused but is able to speak in more words and and tracks with eyes.  Given patient's lab results and clinical presentation, he will need to be admitted to the ICU. Dr. Maree will admit the patient to the ICU.   I discussed this case with my attending physician who cosigned this note including patient's presenting symptoms, physical exam, and planned diagnostics and interventions. Attending physician stated agreement with plan or made changes to plan which were implemented.   Attending physician assessed patient at bedside.  Portions of this report may have been transcribed using voice recognition software. Every effort was made to ensure accuracy; however, inadvertent computerized transcription errors may be present.    Final diagnoses:  Diabetic ketoacidosis without coma associated with type 1 diabetes mellitus (HCC)  Lactic acidosis    ED Discharge Orders     None          Bernis Ernst, [place] 03/31/24 0154    Dasie Faden, MD 04/01/24 1109

## 2024-03-31 NOTE — Progress Notes (Signed)
 Pharmacy Antibiotic Note  Edward Walls is a 43 y.o. male admitted on 03/30/2024 with severe diabetic ketoacidosis, AKI and severe lactic acidosis in the setting of sepsis of unclear etiology.  Pharmacy has been consulted for vancomycin  dosing.  Plan: Vancomycin  1gm x 1 then 1250mg  IV q36h (AUC 470.4, Scr 2.76, TBW) Follow renal function, cultures and clinical course  Height: 6' 3 (190.5 cm) Weight: 72 kg (158 lb 11.7 oz) IBW/kg (Calculated) : 84.5  Temp (24hrs), Avg:98.8 F (37.1 C), Min:97.7 F (36.5 C), Max:99.9 F (37.7 C)  Recent Labs  Lab 03/30/24 2040 03/30/24 2042 03/30/24 2243  WBC 18.4*  --   --   CREATININE  --  2.40* 2.76*  LATICACIDVEN 3.3*  --   --     Estimated Creatinine Clearance: 35.9 mL/min (A) (by C-G formula based on SCr of 2.76 mg/dL (H)).    Not on File  Antimicrobials this admission: 6/16 vanc >> 6/16 CTX >>  Dose adjustments this admission:   Microbiology results: 5/15 BCx:  5/15 MRSA PCR:   Thank you for allowing pharmacy to be a part of this patient's care.  Leeroy Mace RPh 03/31/2024, 12:24 AM

## 2024-03-31 NOTE — Progress Notes (Signed)
   03/31/24 0945  TOC Brief Assessment  Insurance and Status Reviewed  Patient has primary care physician Yes  Home environment has been reviewed home alone  Prior level of function: independent  Prior/Current Home Services No current home services  Social Drivers of Health Review SDOH reviewed no interventions necessary  Readmission risk has been reviewed Yes  Transition of care needs no transition of care needs at this time

## 2024-04-01 DIAGNOSIS — E1010 Type 1 diabetes mellitus with ketoacidosis without coma: Secondary | ICD-10-CM | POA: Diagnosis not present

## 2024-04-01 DIAGNOSIS — A419 Sepsis, unspecified organism: Secondary | ICD-10-CM

## 2024-04-01 DIAGNOSIS — E876 Hypokalemia: Secondary | ICD-10-CM | POA: Diagnosis not present

## 2024-04-01 LAB — COMPREHENSIVE METABOLIC PANEL WITH GFR
ALT: 15 U/L (ref 0–44)
AST: 18 U/L (ref 15–41)
Albumin: 3.1 g/dL — ABNORMAL LOW (ref 3.5–5.0)
Alkaline Phosphatase: 90 U/L (ref 38–126)
Anion gap: 17 — ABNORMAL HIGH (ref 5–15)
BUN: 22 mg/dL — ABNORMAL HIGH (ref 6–20)
CO2: 18 mmol/L — ABNORMAL LOW (ref 22–32)
Calcium: 8.8 mg/dL — ABNORMAL LOW (ref 8.9–10.3)
Chloride: 114 mmol/L — ABNORMAL HIGH (ref 98–111)
Creatinine, Ser: 1.05 mg/dL (ref 0.61–1.24)
GFR, Estimated: >60 mL/min (ref >60)
Glucose, Bld: 233 mg/dL — ABNORMAL HIGH (ref 70–99)
Potassium: 3.6 mmol/L (ref 3.5–5.1)
Sodium: 149 mmol/L — ABNORMAL HIGH (ref 135–145)
Total Bilirubin: 1.4 mg/dL — ABNORMAL HIGH (ref 0.0–1.2)
Total Protein: 6.1 g/dL — ABNORMAL LOW (ref 6.5–8.1)

## 2024-04-01 LAB — BASIC METABOLIC PANEL WITH GFR
Anion gap: 18 — ABNORMAL HIGH (ref 5–15)
Anion gap: 18 — ABNORMAL HIGH (ref 5–15)
Anion gap: 13 (ref 5–15)
Anion gap: 16 — ABNORMAL HIGH (ref 5–15)
Anion gap: 12 (ref 5–15)
BUN: 25 mg/dL — ABNORMAL HIGH (ref 6–20)
BUN: 23 mg/dL — ABNORMAL HIGH (ref 6–20)
BUN: 19 mg/dL (ref 6–20)
BUN: 18 mg/dL (ref 6–20)
BUN: 16 mg/dL (ref 6–20)
CO2: 18 mmol/L — ABNORMAL LOW (ref 22–32)
CO2: 17 mmol/L — ABNORMAL LOW (ref 22–32)
CO2: 21 mmol/L — ABNORMAL LOW (ref 22–32)
CO2: 20 mmol/L — ABNORMAL LOW (ref 22–32)
CO2: 23 mmol/L (ref 22–32)
Calcium: 8.8 mg/dL — ABNORMAL LOW (ref 8.9–10.3)
Calcium: 8.8 mg/dL — ABNORMAL LOW (ref 8.9–10.3)
Calcium: 8.5 mg/dL — ABNORMAL LOW (ref 8.9–10.3)
Calcium: 8.5 mg/dL — ABNORMAL LOW (ref 8.9–10.3)
Calcium: 8.3 mg/dL — ABNORMAL LOW (ref 8.9–10.3)
Chloride: 115 mmol/L — ABNORMAL HIGH (ref 98–111)
Chloride: 112 mmol/L — ABNORMAL HIGH (ref 98–111)
Chloride: 115 mmol/L — ABNORMAL HIGH (ref 98–111)
Chloride: 111 mmol/L (ref 98–111)
Chloride: 109 mmol/L (ref 98–111)
Creatinine, Ser: 1.16 mg/dL (ref 0.61–1.24)
Creatinine, Ser: 1.27 mg/dL — ABNORMAL HIGH (ref 0.61–1.24)
Creatinine, Ser: 1.03 mg/dL (ref 0.61–1.24)
Creatinine, Ser: 0.97 mg/dL (ref 0.61–1.24)
Creatinine, Ser: 0.86 mg/dL (ref 0.61–1.24)
GFR, Estimated: >60 mL/min (ref >60)
GFR, Estimated: >60 mL/min (ref >60)
GFR, Estimated: >60 mL/min (ref >60)
GFR, Estimated: >60 mL/min (ref >60)
GFR, Estimated: >60 mL/min (ref >60)
Glucose, Bld: 232 mg/dL — ABNORMAL HIGH (ref 70–99)
Glucose, Bld: 263 mg/dL — ABNORMAL HIGH (ref 70–99)
Glucose, Bld: 113 mg/dL — ABNORMAL HIGH (ref 70–99)
Glucose, Bld: 158 mg/dL — ABNORMAL HIGH (ref 70–99)
Glucose, Bld: 164 mg/dL — ABNORMAL HIGH (ref 70–99)
Potassium: 3.5 mmol/L (ref 3.5–5.1)
Potassium: 3.8 mmol/L (ref 3.5–5.1)
Potassium: 3.3 mmol/L — ABNORMAL LOW (ref 3.5–5.1)
Potassium: 3.2 mmol/L — ABNORMAL LOW (ref 3.5–5.1)
Potassium: 3.3 mmol/L — ABNORMAL LOW (ref 3.5–5.1)
Sodium: 151 mmol/L — ABNORMAL HIGH (ref 135–145)
Sodium: 147 mmol/L — ABNORMAL HIGH (ref 135–145)
Sodium: 149 mmol/L — ABNORMAL HIGH (ref 135–145)
Sodium: 147 mmol/L — ABNORMAL HIGH (ref 135–145)
Sodium: 144 mmol/L (ref 135–145)

## 2024-04-01 LAB — BETA-HYDROXYBUTYRIC ACID
Beta-Hydroxybutyric Acid: 5.05 mmol/L — ABNORMAL HIGH (ref 0.05–0.27)
Beta-Hydroxybutyric Acid: 7 mmol/L — ABNORMAL HIGH (ref 0.05–0.27)
Beta-Hydroxybutyric Acid: 3.32 mmol/L — ABNORMAL HIGH (ref 0.05–0.27)
Beta-Hydroxybutyric Acid: 4.78 mmol/L — ABNORMAL HIGH (ref 0.05–0.27)
Beta-Hydroxybutyric Acid: 2.42 mmol/L — ABNORMAL HIGH (ref 0.05–0.27)

## 2024-04-01 LAB — CBC WITH DIFFERENTIAL/PLATELET
Abs Immature Granulocytes: 0.03 10*3/uL (ref 0.00–0.07)
Basophils Absolute: 0 10*3/uL (ref 0.0–0.1)
Basophils Relative: 0 %
Eosinophils Absolute: 0 10*3/uL (ref 0.0–0.5)
Eosinophils Relative: 0 %
HCT: 40.4 % (ref 39.0–52.0)
Hemoglobin: 12.9 g/dL — ABNORMAL LOW (ref 13.0–17.0)
Immature Granulocytes: 0 %
Lymphocytes Relative: 14 %
Lymphs Abs: 1.1 10*3/uL (ref 0.7–4.0)
MCH: 28.2 pg (ref 26.0–34.0)
MCHC: 31.9 g/dL (ref 30.0–36.0)
MCV: 88.2 fL (ref 80.0–100.0)
Monocytes Absolute: 1 10*3/uL (ref 0.1–1.0)
Monocytes Relative: 12 %
Neutro Abs: 6 10*3/uL (ref 1.7–7.7)
Neutrophils Relative %: 74 %
Platelets: 273 10*3/uL (ref 150–400)
RBC: 4.58 MIL/uL (ref 4.22–5.81)
RDW: 13.9 % (ref 11.5–15.5)
WBC: 8.2 10*3/uL (ref 4.0–10.5)
nRBC: 0 % (ref 0.0–0.2)

## 2024-04-01 LAB — LACTIC ACID, PLASMA
Lactic Acid, Venous: 1 mmol/L (ref 0.5–1.9)
Lactic Acid, Venous: 1 mmol/L (ref 0.5–1.9)

## 2024-04-01 LAB — GLUCOSE, CAPILLARY
Glucose-Capillary: 132 mg/dL — ABNORMAL HIGH (ref 70–99)
Glucose-Capillary: 152 mg/dL — ABNORMAL HIGH (ref 70–99)
Glucose-Capillary: 154 mg/dL — ABNORMAL HIGH (ref 70–99)
Glucose-Capillary: 157 mg/dL — ABNORMAL HIGH (ref 70–99)
Glucose-Capillary: 169 mg/dL — ABNORMAL HIGH (ref 70–99)
Glucose-Capillary: 170 mg/dL — ABNORMAL HIGH (ref 70–99)
Glucose-Capillary: 173 mg/dL — ABNORMAL HIGH (ref 70–99)
Glucose-Capillary: 173 mg/dL — ABNORMAL HIGH (ref 70–99)
Glucose-Capillary: 173 mg/dL — ABNORMAL HIGH (ref 70–99)
Glucose-Capillary: 187 mg/dL — ABNORMAL HIGH (ref 70–99)
Glucose-Capillary: 190 mg/dL — ABNORMAL HIGH (ref 70–99)
Glucose-Capillary: 191 mg/dL — ABNORMAL HIGH (ref 70–99)
Glucose-Capillary: 199 mg/dL — ABNORMAL HIGH (ref 70–99)
Glucose-Capillary: 214 mg/dL — ABNORMAL HIGH (ref 70–99)
Glucose-Capillary: 231 mg/dL — ABNORMAL HIGH (ref 70–99)
Glucose-Capillary: 236 mg/dL — ABNORMAL HIGH (ref 70–99)
Glucose-Capillary: 273 mg/dL — ABNORMAL HIGH (ref 70–99)

## 2024-04-01 LAB — MAGNESIUM
Magnesium: 2.2 mg/dL (ref 1.7–2.4)
Magnesium: 2.1 mg/dL (ref 1.7–2.4)
Magnesium: 1.7 mg/dL (ref 1.7–2.4)
Magnesium: 1.8 mg/dL (ref 1.7–2.4)

## 2024-04-01 LAB — RAPID URINE DRUG SCREEN, HOSP PERFORMED
Amphetamines: NOT DETECTED
Barbiturates: NOT DETECTED
Benzodiazepines: NOT DETECTED
Cocaine: NOT DETECTED
Opiates: NOT DETECTED
Tetrahydrocannabinol: NOT DETECTED

## 2024-04-01 LAB — PHOSPHORUS: Phosphorus: 2 mg/dL — ABNORMAL LOW (ref 2.5–4.6)

## 2024-04-01 LAB — HEMOGLOBIN A1C
Hgb A1c MFr Bld: >15.5 % — ABNORMAL HIGH (ref 4.8–5.6)
Mean Plasma Glucose: >398 mg/dL

## 2024-04-01 LAB — CBC
HCT: 41.2 % (ref 39.0–52.0)
Hemoglobin: 13.2 g/dL (ref 13.0–17.0)
MCH: 28.4 pg (ref 26.0–34.0)
MCHC: 32 g/dL (ref 30.0–36.0)
MCV: 88.6 fL (ref 80.0–100.0)
Platelets: 266 K/uL (ref 150–400)
RBC: 4.65 MIL/uL (ref 4.22–5.81)
RDW: 13.7 % (ref 11.5–15.5)
WBC: 8.7 K/uL (ref 4.0–10.5)
nRBC: 0 % (ref 0.0–0.2)

## 2024-04-01 LAB — AMMONIA: Ammonia: 30 umol/L (ref 9–35)

## 2024-04-01 MED ORDER — INSULIN REGULAR(HUMAN) IN NACL 100-0.9 UT/100ML-% IV SOLN
INTRAVENOUS | Status: DC
  Administered 2024-04-01: 8.5 [IU]/h via INTRAVENOUS

## 2024-04-01 MED ORDER — POTASSIUM PHOSPHATES 15 MMOLE/5ML IV SOLN
15.0000 mmol | Freq: Once | INTRAVENOUS | Status: AC
  Administered 2024-04-01: 15 mmol via INTRAVENOUS
  Filled 2024-04-01: qty 5

## 2024-04-01 MED ORDER — INSULIN ASPART 100 UNIT/ML IJ SOLN
0.0000 [IU] | INTRAMUSCULAR | Status: DC
  Administered 2024-04-01: 5 [IU] via SUBCUTANEOUS
  Administered 2024-04-01: 8 [IU] via SUBCUTANEOUS

## 2024-04-01 MED ORDER — SODIUM CHLORIDE 0.9 % IV BOLUS: 1000.0000 mL | Freq: Once | INTRAVENOUS | Status: DC

## 2024-04-01 MED ORDER — DEXTROSE IN LACTATED RINGERS 5 % IV SOLN: INTRAVENOUS | Status: AC

## 2024-04-01 MED ORDER — LACTATED RINGERS IV BOLUS
1000.0000 mL | Freq: Once | INTRAVENOUS | Status: AC
  Administered 2024-04-01: 1000 mL via INTRAVENOUS

## 2024-04-01 MED ORDER — LACTATED RINGERS IV SOLN: INTRAVENOUS | Status: AC

## 2024-04-01 MED ORDER — LACTATED RINGERS IV BOLUS
20.0000 mL/kg | Freq: Once | INTRAVENOUS | Status: AC
  Administered 2024-04-01: 1000 mL via INTRAVENOUS

## 2024-04-01 MED ORDER — POTASSIUM CHLORIDE 10 MEQ/100ML IV SOLN
10.0000 meq | INTRAVENOUS | Status: AC
  Administered 2024-04-01 (×4): 10 meq via INTRAVENOUS
  Filled 2024-04-01 (×4): qty 100

## 2024-04-01 MED ORDER — POTASSIUM CHLORIDE 10 MEQ/100ML IV SOLN
10.0000 meq | INTRAVENOUS | Status: DC
  Administered 2024-04-01 (×3): 10 meq via INTRAVENOUS
  Filled 2024-04-01 (×3): qty 100

## 2024-04-01 MED ORDER — POTASSIUM CHLORIDE 10 MEQ/100ML IV SOLN
10.0000 meq | INTRAVENOUS | Status: AC
  Administered 2024-04-01 (×2): 10 meq via INTRAVENOUS
  Filled 2024-04-01 (×2): qty 100

## 2024-04-01 NOTE — Plan of Care (Signed)
   Problem: Metabolic: Goal: Ability to maintain appropriate glucose levels will improve Outcome: Progressing

## 2024-04-01 NOTE — Hospital Course (Signed)
 The patient is a 43 year old African-American male with type 1 diabetes mellitus was brought in by EMS after family was concerned of the do not hear from him for couple days so a welfare check was performed.  EMS found him with a GCS of 7 had to put him on 12 L of nonrebreather mask.  He is very altered and almost intubated but was protecting his airway was not intubated.  He was started on initiation of DKA pathway.  He was transition to subcutaneous insulin  overnight however still remained in DKA insulin  drip was initiated and multiple electrolyte abnormalities were being corrected.  Assessment and Plan:  DKA: Was transitioned to subcutaneous insulin  yesterday however given that he did not fully recover and because his gap opened up we resumed his insulin  drip per protocol.  Continue IV fluid hydration aggressively and continue CBG monitoring.  Continue to trend beta hydroxybutyrate acid is slowly trending down.  Will need diabetes education coordinator consultation and checking hemoglobin A1c.  Continue to monitor serial BMPs and once his gap closes transition to subcutaneous insulin . NPO currently. Continue with IV fluid and with antiemetics.  Last beta-hydroxybutyrate was still 2.42 and hemoglobin A1c this visit was greater than 15.5.   Acute encephalopathy/ mild hyperammonemia: presumed 2/2 to DKA; ct head no acute abnormality; ethanol, acetaminophen , salicylate level wnl.  Currently his mentation is doing better and we are limiting his sedating meds.  UDS is negative.  Repeat ammonia level in a.m as last one was 30.   AKI Hyperkalemia -> Hypokalemia -BUN/Cr Trend: Recent Labs  Lab 03/31/24 2111 04/01/24 0333 04/01/24 0830 04/01/24 0919 04/01/24 1213 04/01/24 1718 04/01/24 2104  BUN 26* 25* 22* 23* 19 18 16   CREATININE 1.16 1.16 1.05 1.27* 1.03 0.97 0.86  K+ Level Trend :  Recent Labs  Lab 03/31/24 2111 04/01/24 0333 04/01/24 0830 04/01/24 0919 04/01/24 1213 04/01/24 1718  04/01/24 2104  K 4.1 3.5 3.6 3.8 3.3* 3.2* 3.3*  -Replete Electrolytes  -Avoid Nephrotoxic Medications, Contrast Dyes, Hypotension and Dehydration to Ensure Adequate Renal Perfusion and will need to Renally Adjust Meds -Continue to Monitor and Trend Renal Function carefully and repeat CMP in the AM   Sepsis: unclear source; Checked pct and was 6.88. C/w broad spectrum abx -follow cultures. WBC has normalized   GERD/GI Prophylaxis/Hx of esophagitis: C/w PPI   Hx of depression: C/w Lexapro  when tolerating po   Hx of peripheral neuropathy hold home gabapentin given poor mental status and resume once he is on a diet  Hyperbilirubinemia: T Bili now 1.4. CTM and Trend and repeat CMP in the AM  Hypoalbuminemia: Albumin Level went from 4.0 -> 3.1. CTM and Trend and repeat CMP in the AM

## 2024-04-01 NOTE — Progress Notes (Signed)
 [facility] ADULT ICU REPLACEMENT PROTOCOL   The patient does apply for the [facility] Adult ICU Electrolyte Replacment Protocol based on the criteria listed below:   1.Exclusion criteria: TCTS, ECMO, Dialysis, and Myasthenia Gravis patients 2. Is GFR >/= 30 ml/min? Yes.    Patient's GFR today is >60 3. Is SCr </= 2? Yes.   Patient's SCr is 1.16 mg/dL 4. Did SCr increase >/= 0.5 in 24 hours? No. 5.Pt's weight >40kg  Yes.   6. Abnormal electrolyte(s): K+ 3.5  7. Electrolytes replaced per protocol 8.  Call MD STAT for K+ </= 2.5, Phos </= 1, or Mag </= 1 Physician:  Dr. Haze Rummer, [place] ORN 04/01/2024 5:44 AM

## 2024-04-01 NOTE — Progress Notes (Signed)
 eLink Physician-Brief Progress Note Patient Name: Edward Walls DOB: March 17, 1981 MRN: [id number]   Date of Service  04/01/2024  HPI/Events of Note  DKA transition from insulin  drip to Semglee .  Remains NPO.  SSI with ceiling CBG at 200  eICU Interventions  Adjusted insulin  sliding scale insulin  to standard protocol.  Maintain Semglee  as previously ordered     Intervention Category Intermediate Interventions: Hyperglycemia - evaluation and treatment  Lorah Kalina 04/01/2024, 12:04 AM

## 2024-04-01 NOTE — Progress Notes (Signed)
 PROGRESS NOTE    Edward Walls  FMW:[id number] DOB: Apr 26, 1981 DOA: 03/30/2024 PCP: Berneta Elsie Sayre, MD   Brief Narrative:  The patient is a 43 year old African-American male with type 1 diabetes mellitus was brought in by EMS after family was concerned of the do not hear from him for couple days so a welfare check was performed.  EMS found him with a GCS of 7 had to put him on 12 L of nonrebreather mask.  He is very altered and almost intubated but was protecting his airway was not intubated.  He was started on initiation of DKA pathway.  He was transition to subcutaneous insulin  overnight however still remained in DKA insulin  drip was initiated and multiple electrolyte abnormalities were being corrected.  Assessment and Plan:  DKA: Was transitioned to subcutaneous insulin  yesterday however given that he did not fully recover and because his gap opened up we resumed his insulin  drip per protocol.  Continue IV fluid hydration aggressively and continue CBG monitoring.  Continue to trend beta hydroxybutyrate acid is slowly trending down.  Will need diabetes education coordinator consultation and checking hemoglobin A1c.  Continue to monitor serial BMPs and once his gap closes transition to subcutaneous insulin . NPO currently. Continue with IV fluid and with antiemetics.  Last beta-hydroxybutyrate was still 2.42 and hemoglobin A1c this visit was greater than 15.5.   Acute encephalopathy/ mild hyperammonemia: presumed 2/2 to DKA; ct head no acute abnormality; ethanol, acetaminophen , salicylate level wnl.  Currently his mentation is doing better and we are limiting his sedating meds.  UDS is negative.  Repeat ammonia level in a.m as last one was 30.   AKI Hyperkalemia -> Hypokalemia -BUN/Cr Trend: Recent Labs  Lab 03/31/24 2111 04/01/24 0333 04/01/24 0830 04/01/24 0919 04/01/24 1213 04/01/24 1718 04/01/24 2104  BUN 26* 25* 22* 23* 19 18 16   CREATININE 1.16 1.16 1.05 1.27* 1.03  0.97 0.86  K+ Level Trend :  Recent Labs  Lab 03/31/24 2111 04/01/24 0333 04/01/24 0830 04/01/24 0919 04/01/24 1213 04/01/24 1718 04/01/24 2104  K 4.1 3.5 3.6 3.8 3.3* 3.2* 3.3*  -Replete Electrolytes  -Avoid Nephrotoxic Medications, Contrast Dyes, Hypotension and Dehydration to Ensure Adequate Renal Perfusion and will need to Renally Adjust Meds -Continue to Monitor and Trend Renal Function carefully and repeat CMP in the AM   Sepsis: unclear source; Checked pct and was 6.88. C/w broad spectrum abx -follow cultures. WBC has normalized   GERD/GI Prophylaxis/Hx of esophagitis: C/w PPI   Hx of depression: C/w Lexapro  when tolerating po   Hx of peripheral neuropathy hold home gabapentin given poor mental status and resume once he is on a diet  Hyperbilirubinemia: T Bili now 1.4. CTM and Trend and repeat CMP in the AM  Hypoalbuminemia: Albumin Level went from 4.0 -> 3.1. CTM and Trend and repeat CMP in the AM   DVT prophylaxis: heparin  injection 5,000 Units Start: 03/31/24 0600 SCDs Start: 03/30/24 2243    Code Status: Full Code Family Communication: No family present @ bedside  Disposition Plan:  Level of care: Stepdown Status is: Inpatient Remains inpatient appropriate because: He is on insulin  drip and will need to be transitioned once his gap closes   Consultants:  PCCM Transfer  Procedures:  As delineated as above  Antimicrobials:  Anti-infectives (From admission, onward)    Start     Dose/Rate Route Frequency Ordered Stop   04/01/24 1200  vancomycin  (VANCOREADY) IVPB 1250 mg/250 mL  Status:  Discontinued  1,250 mg 166.7 mL/hr over 90 Minutes Intravenous Every 36 hours 03/31/24 0537 03/31/24 1241   03/31/24 0045  vancomycin  (VANCOCIN ) IVPB 1000 mg/200 mL premix        1,000 mg 200 mL/hr over 60 Minutes Intravenous  Once 03/30/24 2349 03/31/24 0344   03/30/24 2337  cefTRIAXone  (ROCEPHIN ) 2 g in sodium chloride  0.9 % 100 mL IVPB        2 g 200 mL/hr over  30 Minutes Intravenous Every 24 hours 03/30/24 2337         Subjective: Seen and examined at bedside and he was resting and denied any complaints but was wanting some unsweet tea.  Feels okay.  Denies any chest pain.  No other concerns or complaints at this time.  Objective: Vitals:   04/01/24 1900 04/01/24 1925 04/01/24 2000 04/01/24 2115  BP: 134/76  137/85 (!) 165/80  Pulse: 100  85 94  Resp: 15   14  Temp:  98.2 F (36.8 C)    TempSrc:  Oral    SpO2: 100%  100% 100%  Weight:      Height:        Intake/Output Summary (Last 24 hours) at 04/01/2024 2154 Last data filed at 04/01/2024 1919 Gross per 24 hour  Intake 4276.54 ml  Output 2200 ml  Net 2076.54 ml   Filed Weights   03/30/24 2355 03/31/24 0608 04/01/24 0406  Weight: 72 kg 71.8 kg 72.8 kg   Examination: Physical Exam:  Constitutional: WN/WD, young African-American male in no acute distress Respiratory: Mildly diminished to auscultation bilaterally, no wheezing, rales, rhonchi or crackles. Normal respiratory effort and patient is not tachypenic. No accessory muscle use.  Unlabored breathing Cardiovascular: RRR, no murmurs / rubs / gallops. S1 and S2 auscultated. No extremity edema.  Abdomen: Soft, a little-tender, non-distended. Bowel sounds positive.  GU: Deferred. Musculoskeletal: No clubbing / cyanosis of digits/nails. No joint deformity upper and lower extremities.  Skin: No rashes, lesions, ulcers limited skin evaluation. No induration; Warm and dry.  Neurologic: CN 2-12 grossly intact with no focal deficits. Romberg sign and cerebellar reflexes not assessed.  Psychiatric: Normal judgment and insight. Alert and oriented x 3. Normal mood and appropriate affect.   Data Reviewed: I have personally reviewed following labs and imaging studies  CBC: Recent Labs  Lab 03/30/24 2040 03/30/24 2042 03/31/24 0303 03/31/24 0755 04/01/24 0330 04/01/24 0830  WBC 18.4*  --  16.4* 14.5* 8.7 8.2  NEUTROABS 14.7*  --    --   --   --  6.0  HGB 13.5 16.7 13.9 14.6 13.2 12.9*  HCT 46.6 49.0 45.3 45.6 41.2 40.4  MCV 98.3  --  91.7 88.4 88.6 88.2  PLT 400  --  360 286 266 273   Basic Metabolic Panel: Recent Labs  Lab 03/31/24 1013 03/31/24 1015 03/31/24 2111 04/01/24 0333 04/01/24 0830 04/01/24 0919 04/01/24 1213 04/01/24 1718 04/01/24 2104  NA 146*   < > 149* 151* 149* 147* 149* 147* 144  K 4.0   < > 4.1 3.5 3.6 3.8 3.3* 3.2* 3.3*  CL 115*   < > 114* 115* 114* 112* 115* 111 109  CO2 15*   < > 20* 18* 18* 17* 21* 20* 23  GLUCOSE 860*   < > 239* 232* 233* 263* 113* 158* 164*  BUN 26*   < > 26* 25* 22* 23* 19 18 16   CREATININE 1.08   < > 1.16 1.16 1.05 1.27* 1.03 0.97 0.86  CALCIUM   8.7*   < > 9.2 8.8* 8.8* 8.8* 8.5* 8.5* 8.3*  MG  --    < > 2.1 2.2 2.1  --  1.7  --  1.8  PHOS 1.6*  --   --   --  2.0*  --   --   --   --    < > = values in this interval not displayed.   GFR: Estimated Creatinine Clearance: 116.4 mL/min (by C-G formula based on SCr of 0.86 mg/dL). Liver Function Tests: Recent Labs  Lab 03/30/24 2243 04/01/24 0830  AST 26 18  ALT 23 15  ALKPHOS 128* 90  BILITOT 3.1* 1.4*  PROT 8.1 6.1*  ALBUMIN 4.0 3.1*   No results for input(s): LIPASE, AMYLASE in the last 168 hours. Recent Labs  Lab 03/30/24 2040 04/01/24 0330  AMMONIA 38* 30   Coagulation Profile: No results for input(s): INR, PROTIME in the last 168 hours. Cardiac Enzymes: Recent Labs  Lab 03/31/24 0305  CKTOTAL 238   BNP (last 3 results) No results for input(s): PROBNP in the last 8760 hours. HbA1C: Recent Labs    03/31/24 1013  HGBA1C >15.5*   CBG: Recent Labs  Lab 04/01/24 1743 04/01/24 1826 04/01/24 1945 04/01/24 2047 04/01/24 2149  GLUCAP 154* 170* 187* 173* 173*   Lipid Profile: No results for input(s): CHOL, HDL, LDLCALC, TRIG, CHOLHDL, LDLDIRECT in the last 72 hours. Thyroid Function Tests: Recent Labs    03/31/24 0303  TSH 0.436   Anemia Panel: No results  for input(s): VITAMINB12, FOLATE, FERRITIN, TIBC, IRON, RETICCTPCT in the last 72 hours. Sepsis Labs: Recent Labs  Lab 03/31/24 0303 03/31/24 0532 03/31/24 1015 04/01/24 0919 04/01/24 1213  PROCALCITON  --   --  6.88  --   --   LATICACIDVEN 2.2* 2.1*  --  1.0 1.0   Recent Results (from the past 240 hours)  MRSA Next Gen by PCR, Nasal     Status: None   Collection Time: 03/30/24 12:00 AM   Specimen: Nasal Mucosa; Nasal Swab  Result Value Ref Range Status   MRSA by PCR Next Gen NOT DETECTED NOT DETECTED Final    Comment: (NOTE) The GeneXpert MRSA Assay (FDA approved for NASAL specimens only), is one component of a comprehensive MRSA colonization surveillance program. It is not intended to diagnose MRSA infection nor to guide or monitor treatment for MRSA infections. Test performance is not FDA approved in patients less than 28 years old. Performed at [facility], [address] [place], [place] [address]   Blood culture (routine x 2)     Status: None (Preliminary result)   Collection Time: 03/30/24  8:55 PM   Specimen: BLOOD  Result Value Ref Range Status   Specimen Description   Final    BLOOD BLOOD RIGHT ARM Performed at [facility], [address], [place], [place] [address]    Special Requests   Final    BOTTLES DRAWN AEROBIC AND ANAEROBIC Blood Culture results may not be optimal due to an inadequate volume of blood received in culture bottles Performed at [facility], [address] [place], [place] [address]    Culture   Final    NO GROWTH 2 DAYS Performed at [facility], [address], [place], [place] [address]    Report Status PENDING  Incomplete  Blood culture (routine x 2)     Status: None (Preliminary result)   Collection Time: 03/30/24 10:43 PM  Specimen: BLOOD  Result Value Ref Range Status   Specimen Description   Final    BLOOD BLOOD LEFT ARM Performed at [facility], [address] [place], [place] [address]    Special Requests   Final    BOTTLES DRAWN AEROBIC AND ANAEROBIC Blood Culture results may not be optimal due to an inadequate volume of blood received in culture bottles Performed at [facility], [address] [place], [place] [address]    Culture   Final    NO GROWTH 1 DAY Performed at [facility], [address], [place], [place] [address]    Report Status PENDING  Incomplete  Resp panel by RT-PCR (RSV, Flu A&B, Covid)     Status: None   Collection Time: 03/30/24 10:43 PM   Specimen: Nasal Swab  Result Value Ref Range Status   SARS Coronavirus 2 by RT PCR NEGATIVE NEGATIVE Final    Comment: (NOTE) SARS-CoV-2 target nucleic acids are NOT DETECTED.  The SARS-CoV-2 RNA is generally detectable in upper respiratory specimens during the acute phase of infection. The lowest concentration of SARS-CoV-2 viral copies this assay can detect is 138 copies/mL. A negative result does not preclude SARS-Cov-2 infection and should not be used as the sole basis for treatment or other patient management decisions. A negative result may occur with  improper specimen collection/handling, submission of specimen other than nasopharyngeal swab, presence of viral mutation(s) within the areas targeted by this assay, and inadequate number of viral copies(<138 copies/mL). A negative result must be combined with clinical observations, patient history, and epidemiological information. The expected result is Negative.  Fact Sheet for Patients:  BloggerCourse.com  Fact Sheet for Healthcare Providers:  SeriousBroker.it  This test is no t yet approved or cleared by the [place]  FDA and  has been authorized for detection and/or diagnosis of SARS-CoV-2 by FDA under an Emergency Use Authorization (EUA). This EUA will remain  in effect (meaning this test can be  used) for the duration of the COVID-19 declaration under Section 564(b)(1) of the Act, 21 U.S.C.section 360bbb-3(b)(1), unless the authorization is terminated  or revoked sooner.       Influenza A by PCR NEGATIVE NEGATIVE Final   Influenza B by PCR NEGATIVE NEGATIVE Final    Comment: (NOTE) The Xpert Xpress SARS-CoV-2/FLU/RSV plus assay is intended as an aid in the diagnosis of influenza from Nasopharyngeal swab specimens and should not be used as a sole basis for treatment. Nasal washings and aspirates are unacceptable for Xpert Xpress SARS-CoV-2/FLU/RSV testing.  Fact Sheet for Patients: BloggerCourse.com  Fact Sheet for Healthcare Providers: SeriousBroker.it  This test is not yet approved or cleared by the [place]  FDA and has been authorized for detection and/or diagnosis of SARS-CoV-2 by FDA under an Emergency Use Authorization (EUA). This EUA will remain in effect (meaning this test can be used) for the duration of the COVID-19 declaration under Section 564(b)(1) of the Act, 21 U.S.C. section 360bbb-3(b)(1), unless the authorization is terminated or revoked.     Resp Syncytial Virus by PCR NEGATIVE NEGATIVE Final    Comment: (NOTE) Fact Sheet for Patients: BloggerCourse.com  Fact Sheet for Healthcare Providers: SeriousBroker.it  This test is not yet approved or cleared by the [place]  FDA and has been authorized for detection and/or diagnosis of SARS-CoV-2 by FDA under an Emergency Use Authorization (EUA). This EUA will remain in effect (meaning this test can be used) for the duration of the COVID-19  declaration under Section 564(b)(1) of the Act, 21 U.S.C. section 360bbb-3(b)(1), unless the authorization is terminated or revoked.  Performed at [facility], [address] [place], [place] [address]     Radiology Studies: CT HEAD WO  CONTRAST ( ) Result Date: 03/30/2024 EXAM: CT HEAD WITHOUT CONTRAST 03/30/2024 11:29:06 PM TECHNIQUE: CT of the head was performed without the administration of intravenous contrast. Automated exposure control, iterative reconstruction, and/or weight based adjustment of the mA/kV was utilized to reduce the radiation dose to as low as reasonably achievable. COMPARISON: 06/04/2020 CLINICAL HISTORY: Mental status change, persistent or worsening. AMS, hyperglycemia. EMS was called out for welfare check. Pt found with GCS of 7 and blood sugar reading high. FINDINGS: BRAIN AND VENTRICLES: There is no acute intracranial hemorrhage, mass effect or midline shift. No abnormal extra-axial fluid collection. The gray-white differentiation is maintained without an acute infarct. There is no hydrocephalus. ORBITS: The visualized portion of the orbits demonstrate no acute abnormality. SINUSES: The visualized paranasal sinuses and mastoid air cells demonstrate no acute abnormality. SOFT TISSUES AND SKULL: No acute abnormality of the visualized skull or soft tissues. IMPRESSION: 1. No acute intracranial abnormality. Electronically signed by: Sriyesh Krishnan MD 03/30/2024 11:45 PM EDT RP Workstation: [id number]   Scheduled Meds:  Chlorhexidine  Gluconate Cloth  6 each Topical Daily   escitalopram   10 mg Oral QHS   heparin   5,000 Units Subcutaneous Q8H   pantoprazole  (PROTONIX ) IV  40 mg Intravenous Daily   Continuous Infusions:  cefTRIAXone  (ROCEPHIN )  IV Stopped (04/01/24 0109)   dextrose  5% lactated ringers  125 mL/hr at 04/01/24 1942   insulin  2.6 Units/hr (04/01/24 1919)   lactated ringers      ondansetron  (ZOFRAN ) IV     potassium chloride  10 mEq (04/01/24 2113)   potassium PHOSPHATE  IVPB (in mmol) 43 mL/hr at 04/01/24 1919    LOS: 2 days   Alejandro Marker, DO [facility] Available via Epic secure chat 7am-7pm After these hours, please refer to coverage provider listed on [email or web address] 04/01/2024, 9:54 PM

## 2024-04-01 NOTE — Plan of Care (Signed)
  Problem: Clinical Measurements: Goal: Cardiovascular complication will be avoided Outcome: Progressing   Problem: Activity: Goal: Risk for activity intolerance will decrease Outcome: Progressing   Problem: Elimination: Goal: Will not experience complications related to urinary retention Outcome: Progressing   Problem: Pain Managment: Goal: General experience of comfort will improve and/or be controlled Outcome: Progressing   Problem: Safety: Goal: Ability to remain free from injury will improve Outcome: Progressing

## 2024-04-02 DIAGNOSIS — K219 Gastro-esophageal reflux disease without esophagitis: Secondary | ICD-10-CM

## 2024-04-02 DIAGNOSIS — F32A Depression, unspecified: Secondary | ICD-10-CM

## 2024-04-02 DIAGNOSIS — G9341 Metabolic encephalopathy: Secondary | ICD-10-CM

## 2024-04-02 DIAGNOSIS — E876 Hypokalemia: Secondary | ICD-10-CM

## 2024-04-02 DIAGNOSIS — K5900 Constipation, unspecified: Secondary | ICD-10-CM

## 2024-04-02 DIAGNOSIS — R6510 Systemic inflammatory response syndrome (SIRS) of non-infectious origin without acute organ dysfunction: Secondary | ICD-10-CM

## 2024-04-02 DIAGNOSIS — N179 Acute kidney failure, unspecified: Secondary | ICD-10-CM

## 2024-04-02 DIAGNOSIS — E1110 Type 2 diabetes mellitus with ketoacidosis without coma: Secondary | ICD-10-CM | POA: Diagnosis not present

## 2024-04-02 LAB — CBC WITH DIFFERENTIAL/PLATELET
Abs Immature Granulocytes: 0.02 10*3/uL (ref 0.00–0.07)
Basophils Absolute: 0 10*3/uL (ref 0.0–0.1)
Basophils Relative: 0 %
Eosinophils Absolute: 0 10*3/uL (ref 0.0–0.5)
Eosinophils Relative: 0 %
HCT: 39.2 % (ref 39.0–52.0)
Hemoglobin: 12.2 g/dL — ABNORMAL LOW (ref 13.0–17.0)
Immature Granulocytes: 0 %
Lymphocytes Relative: 24 %
Lymphs Abs: 1.6 10*3/uL (ref 0.7–4.0)
MCH: 28.3 pg (ref 26.0–34.0)
MCHC: 31.1 g/dL (ref 30.0–36.0)
MCV: 91 fL (ref 80.0–100.0)
Monocytes Absolute: 1 10*3/uL (ref 0.1–1.0)
Monocytes Relative: 15 %
Neutro Abs: 4 10*3/uL (ref 1.7–7.7)
Neutrophils Relative %: 61 %
Platelets: 247 10*3/uL (ref 150–400)
RBC: 4.31 MIL/uL (ref 4.22–5.81)
RDW: 13.9 % (ref 11.5–15.5)
WBC: 6.6 10*3/uL (ref 4.0–10.5)
nRBC: 0 % (ref 0.0–0.2)

## 2024-04-02 LAB — BASIC METABOLIC PANEL WITH GFR
Anion gap: 11 (ref 5–15)
BUN: 14 mg/dL (ref 6–20)
CO2: 24 mmol/L (ref 22–32)
Calcium: 8.1 mg/dL — ABNORMAL LOW (ref 8.9–10.3)
Chloride: 108 mmol/L (ref 98–111)
Creatinine, Ser: 0.79 mg/dL (ref 0.61–1.24)
GFR, Estimated: >60 mL/min (ref >60)
Glucose, Bld: 186 mg/dL — ABNORMAL HIGH (ref 70–99)
Potassium: 3 mmol/L — ABNORMAL LOW (ref 3.5–5.1)
Sodium: 143 mmol/L (ref 135–145)

## 2024-04-02 LAB — GLUCOSE, CAPILLARY
Glucose-Capillary: 146 mg/dL — ABNORMAL HIGH (ref 70–99)
Glucose-Capillary: 160 mg/dL — ABNORMAL HIGH (ref 70–99)
Glucose-Capillary: 161 mg/dL — ABNORMAL HIGH (ref 70–99)
Glucose-Capillary: 163 mg/dL — ABNORMAL HIGH (ref 70–99)
Glucose-Capillary: 178 mg/dL — ABNORMAL HIGH (ref 70–99)
Glucose-Capillary: 181 mg/dL — ABNORMAL HIGH (ref 70–99)
Glucose-Capillary: 193 mg/dL — ABNORMAL HIGH (ref 70–99)
Glucose-Capillary: 195 mg/dL — ABNORMAL HIGH (ref 70–99)
Glucose-Capillary: 196 mg/dL — ABNORMAL HIGH (ref 70–99)
Glucose-Capillary: 198 mg/dL — ABNORMAL HIGH (ref 70–99)
Glucose-Capillary: 201 mg/dL — ABNORMAL HIGH (ref 70–99)
Glucose-Capillary: 205 mg/dL — ABNORMAL HIGH (ref 70–99)
Glucose-Capillary: 208 mg/dL — ABNORMAL HIGH (ref 70–99)
Glucose-Capillary: 208 mg/dL — ABNORMAL HIGH (ref 70–99)

## 2024-04-02 LAB — COMPREHENSIVE METABOLIC PANEL WITH GFR
ALT: 16 U/L (ref 0–44)
AST: 18 U/L (ref 15–41)
Albumin: 2.9 g/dL — ABNORMAL LOW (ref 3.5–5.0)
Alkaline Phosphatase: 91 U/L (ref 38–126)
Anion gap: 11 (ref 5–15)
BUN: 12 mg/dL (ref 6–20)
CO2: 24 mmol/L (ref 22–32)
Calcium: 8.3 mg/dL — ABNORMAL LOW (ref 8.9–10.3)
Chloride: 105 mmol/L (ref 98–111)
Creatinine, Ser: 0.78 mg/dL (ref 0.61–1.24)
GFR, Estimated: >60 mL/min (ref >60)
Glucose, Bld: 180 mg/dL — ABNORMAL HIGH (ref 70–99)
Potassium: 3 mmol/L — ABNORMAL LOW (ref 3.5–5.1)
Sodium: 140 mmol/L (ref 135–145)
Total Bilirubin: 1.5 mg/dL — ABNORMAL HIGH (ref 0.0–1.2)
Total Protein: 6 g/dL — ABNORMAL LOW (ref 6.5–8.1)

## 2024-04-02 LAB — BETA-HYDROXYBUTYRIC ACID
Beta-Hydroxybutyric Acid: 1.61 mmol/L — ABNORMAL HIGH (ref 0.05–0.27)
Beta-Hydroxybutyric Acid: 1.84 mmol/L — ABNORMAL HIGH (ref 0.05–0.27)

## 2024-04-02 LAB — MAGNESIUM: Magnesium: 1.5 mg/dL — ABNORMAL LOW (ref 1.7–2.4)

## 2024-04-02 LAB — PHOSPHORUS: Phosphorus: 2.5 mg/dL (ref 2.5–4.6)

## 2024-04-02 LAB — PROCALCITONIN: Procalcitonin: 2.99 ng/mL

## 2024-04-02 MED ORDER — LACTATED RINGERS IV SOLN: INTRAVENOUS | Status: DC

## 2024-04-02 MED ORDER — SORBITOL 70 % SOLN
30.0000 mL | Status: AC
  Administered 2024-04-02 (×2): 30 mL via ORAL
  Filled 2024-04-02 (×2): qty 30

## 2024-04-02 MED ORDER — POTASSIUM CHLORIDE CRYS ER 20 MEQ PO TBCR
40.0000 meq | EXTENDED_RELEASE_TABLET | Freq: Once | ORAL | Status: AC
  Administered 2024-04-02: 40 meq via ORAL
  Filled 2024-04-02: qty 2

## 2024-04-02 MED ORDER — POTASSIUM CHLORIDE 10 MEQ/100ML IV SOLN
10.0000 meq | INTRAVENOUS | Status: AC
  Administered 2024-04-02 (×5): 10 meq via INTRAVENOUS
  Filled 2024-04-02 (×5): qty 100

## 2024-04-02 MED ORDER — INSULIN ASPART 100 UNIT/ML IJ SOLN
0.0000 [IU] | INTRAMUSCULAR | Status: DC
  Administered 2024-04-02 (×2): 5 [IU] via SUBCUTANEOUS
  Administered 2024-04-02: 3 [IU] via SUBCUTANEOUS
  Administered 2024-04-03 (×2): 5 [IU] via SUBCUTANEOUS
  Administered 2024-04-03: 2 [IU] via SUBCUTANEOUS

## 2024-04-02 MED ORDER — MAGNESIUM SULFATE 4 GM/100ML IV SOLN
4.0000 g | Freq: Once | INTRAVENOUS | Status: AC
  Administered 2024-04-02: 4 g via INTRAVENOUS
  Filled 2024-04-02: qty 100

## 2024-04-02 MED ORDER — INSULIN GLARGINE-YFGN 100 UNIT/ML ~~LOC~~ SOLN
25.0000 [IU] | Freq: Every day | SUBCUTANEOUS | Status: DC
  Administered 2024-04-02: 25 [IU] via SUBCUTANEOUS
  Filled 2024-04-02 (×2): qty 0.25

## 2024-04-02 MED ORDER — SENNOSIDES-DOCUSATE SODIUM 8.6-50 MG PO TABS
1.0000 | ORAL_TABLET | Freq: Two times a day (BID) | ORAL | Status: DC
  Administered 2024-04-02: 1 via ORAL
  Filled 2024-04-02 (×3): qty 1

## 2024-04-02 MED ORDER — INSULIN ASPART 100 UNIT/ML IJ SOLN
4.0000 [IU] | Freq: Three times a day (TID) | INTRAMUSCULAR | Status: DC
  Administered 2024-04-03: 4 [IU] via SUBCUTANEOUS

## 2024-04-02 MED ORDER — POTASSIUM CHLORIDE CRYS ER 20 MEQ PO TBCR: 40.0000 meq | EXTENDED_RELEASE_TABLET | ORAL | Status: DC

## 2024-04-02 MED ORDER — POTASSIUM CHLORIDE 10 MEQ/100ML IV SOLN
INTRAVENOUS | Status: AC
  Filled 2024-04-02: qty 100

## 2024-04-02 MED ORDER — POLYETHYLENE GLYCOL 3350 17 G PO PACK
17.0000 g | PACK | Freq: Two times a day (BID) | ORAL | Status: DC
  Administered 2024-04-02: 17 g via ORAL
  Filled 2024-04-02 (×3): qty 1

## 2024-04-02 NOTE — Inpatient Diabetes Management (Signed)
 Inpatient Diabetes Program Recommendations  AACE/ADA: New Consensus Statement on Inpatient Glycemic Control (2015)  Target Ranges:  Prepandial:   less than 140 mg/dL      Peak postprandial:   less than 180 mg/dL (1-2 hours)      Critically ill patients:  140 - 180 mg/dL   Lab Results  Component Value Date   GLUCAP 208 (H) 04/02/2024   HGBA1C >15.5 (H) 03/31/2024    Review of Glycemic Control  Diabetes history: DM1 Outpatient Diabetes medications: Lantus  34 units daily, Lyumjev 12 units TID Current orders for Inpatient glycemic control: Semglee  25 units daily, Novolog  0-15 Q4h  HgbA1C - > 15.5% Endo - [place]  Inpatient Diabetes Program Recommendations:    Change Novolog  to 0-9 TID with meals and 0-5 HS (Type 1 and sensitive to insulin )  Add Novolog  6 units TID with meals if eating > 50%  Long discussion with pt about his diabetes control and HgbA1C of > 15.5%. States he gets insulin  from mail order and it's been late, missed Lantus  for a week. Had just gotten it prior to hospitalization. States his diet has been pretty bad lately. Has a lot of life stress. Last Endo appt February 2025. Needs to call and make f/u appt. Monitoring blood sugars with Dexcom G6. Would like to try the G7. Will check on benefits for the G7.  Discussed importance of checking CBGs and maintaining good CBG control to prevent long-term and short-term complications. Explained how hyperglycemia leads to damage within blood vessels which lead to the common complications seen with uncontrolled diabetes. Stressed to the patient the importance of improving glycemic control to prevent further complications from uncontrolled diabetes. Discussed impact of nutrition, exercise, stress, sickness, and medications on diabetes control.  Pt appreciative of visit.  Follow.  Thank you. Shona Brandy, RD, LDN, CDCES Inpatient Diabetes Coordinator [phone]

## 2024-04-02 NOTE — Progress Notes (Signed)
 PROGRESS NOTE    Edward Walls  FMW:[id number] DOB: 20-Sep-1981 DOA: 03/30/2024 PCP: Berneta Elsie Sayre, MD    Chief Complaint  Patient presents with   Hyperglycemia    Brief Narrative:  The patient is a 43 year old African-American male with type 1 diabetes mellitus was brought in by EMS after family was concerned of the do not hear from him for couple days so a welfare check was performed. EMS found him with a GCS of 7 had to put him on 12 L of nonrebreather mask. He is very altered and almost intubated but was protecting his airway was not intubated. He was started on initiation of DKA pathway. He was transition to subcutaneous insulin  overnight however still remained in DKA insulin  drip was initiated and multiple electrolyte abnormalities were being corrected.    Assessment & Plan:   Principal Problem:   DKA (diabetic ketoacidosis) (HCC) Active Problems:   Acute metabolic encephalopathy   Gastroesophageal reflux disease   Hypokalemia   Hypophosphatemia   Hypomagnesemia   AKI (acute kidney injury) (HCC)   SIRS (systemic inflammatory response syndrome) (HCC)   Constipation   Depression  #1 DKA -Patient had presented with altered mental status, noted to be in DKA and initially transition to subcutaneous insulin  however patient remained in DKA and had to be placed back on the Endo tool/glucose stabilizer. - Patient placed on IV fluids for aggressive hydration with continued CBG monitoring. - Beta hydroxybutyrate trending down. - Anion gap closed at 11, bicarb of 24. - Will transition to subcutaneous insulin  of Semglee  25 units daily, SSI. - Check CBGs every 4 hours. - Repeat hemoglobin A1c > 15.5 - Place on carb modified diet. - Diabetes: Negative:.  2.  Acute metabolic encephalopathy/mild hyperammonemia -Found likely secondary to DKA. - Patient noted at baseline to be alert oriented and following commands appropriately. - On admission patient noted to be  altered and almost intubated however not done as patient noted to be protecting his airway. - Patient mental status has improved and likely back to baseline, patient alert oriented to self place and time.  3.  AKI -Likely secondary to prerenal azotemia with improvement with hydration. - Avoid nephrotoxins. - Follow.  4.  Hypokalemia/hypomagnesemia/hypophosphatemia -Potassium at 3.0, phosphorus at 2.5, magnesium  at 1.5. - Magnesium  sulfate 4 g IV x 1. - KCl 10 mEq IV every hour x 5 rounds. - K-Dur 40 mEq p.o. x 1. - Repeat labs in the AM.  5.  SIRS -Patient noted to have criteria for SIRS with a procalcitonin noted on admission of 6.88 without leukocytosis. - Urinalysis nitrite negative leukocytes negative. - Chest x-ray no acute abnormalities. - Blood cultures with no growth to date. - Continue empiric IV Rocephin .  6.  GERD/history of esophagitis -PPI.  7.  History of depression -Continue Lexapro .  8.  Constipation -MiraLAX  twice daily, Senokot-S twice daily. - Sorbitol  p.o. every 3 hours x 2 doses.  9.  History of peripheral neuropathy -Resume home regimen Neurontin and Lyrica.  10.  Hyperbilirubinemia -Follow.  DVT prophylaxis: Heparin  Code Status: Full Family Communication: Updated patient.  No family at bedside. Disposition: Likely home when clinically improved.  Status is: Inpatient Remains inpatient appropriate because: Severity of illness   Consultants:  PCCM  Procedures:  CT head 03/30/2024 Chest x-ray 03/30/2024   Antimicrobials:  Anti-infectives (From admission, onward)    Start     Dose/Rate Route Frequency Ordered Stop   04/01/24 1200  vancomycin  (VANCOREADY) IVPB 1250 mg/250 mL  Status:  Discontinued        1,250 mg 166.7 mL/hr over 90 Minutes Intravenous Every 36 hours 03/31/24 0537 03/31/24 1241   03/31/24 0045  vancomycin  (VANCOCIN ) IVPB 1000 mg/200 mL premix        1,000 mg 200 mL/hr over 60 Minutes Intravenous  Once 03/30/24 2349  03/31/24 0344   03/30/24 2337  cefTRIAXone  (ROCEPHIN ) 2 g in sodium chloride  0.9 % 100 mL IVPB        2 g 200 mL/hr over 30 Minutes Intravenous Every 24 hours 03/30/24 2337           Subjective: Patient sleeping but easily arousable.  States overall he feels better than he did on admission.  Alert and oriented to self place and time.  Denies any chest pain or shortness of breath.  No abdominal pain.  No nausea or vomiting.  Complains of constipation states has not had a bowel movement in over a week.  Objective: Vitals:   04/02/24 1200 04/02/24 1300 04/02/24 1400 04/02/24 1500  BP: (!) 161/100 121/67 138/72   Pulse: 83 88 83   Resp: 12 13 13    Temp:    97.8 F (36.6 C)  TempSrc:    Oral  SpO2: 100% 100% 100%   Weight:      Height:        Intake/Output Summary (Last 24 hours) at 04/02/2024 1638 Last data filed at 04/02/2024 1234 Gross per 24 hour  Intake 4710.32 ml  Output 1700 ml  Net 3010.32 ml   Filed Weights   03/31/24 0608 04/01/24 0406 04/02/24 0500  Weight: 71.8 kg 72.8 kg 76.9 kg    Examination:  General exam: Appears calm and comfortable  Respiratory system: Clear to auscultation. Respiratory effort normal. Cardiovascular system: S1 & S2 heard, RRR. No JVD, murmurs, rubs, gallops or clicks. No pedal edema. Gastrointestinal system: Abdomen is nondistended, soft and nontender. No organomegaly or masses felt. Normal bowel sounds heard. Central nervous system: Alert and oriented. No focal neurological deficits. Extremities: Symmetric 5 x 5 power. Skin: No rashes, lesions or ulcers Psychiatry: Judgement and insight appear normal. Mood & affect appropriate.     Data Reviewed: I have personally reviewed following labs and imaging studies  CBC: Recent Labs  Lab 03/30/24 2040 03/30/24 2042 03/31/24 0303 03/31/24 0755 04/01/24 0330 04/01/24 0830 04/02/24 0335  WBC 18.4*  --  16.4* 14.5* 8.7 8.2 6.6  NEUTROABS 14.7*  --   --   --   --  6.0 4.0  HGB 13.5   <  > 13.9 14.6 13.2 12.9* 12.2*  HCT 46.6   < > 45.3 45.6 41.2 40.4 39.2  MCV 98.3  --  91.7 88.4 88.6 88.2 91.0  PLT 400  --  360 286 266 273 247   < > = values in this interval not displayed.    Basic Metabolic Panel: Recent Labs  Lab 03/31/24 1013 03/31/24 1015 04/01/24 0333 04/01/24 0830 04/01/24 0919 04/01/24 1213 04/01/24 1718 04/01/24 2104 04/02/24 0023 04/02/24 0335  NA 146*   < > 151* 149*   < > 149* 147* 144 143 140  K 4.0   < > 3.5 3.6   < > 3.3* 3.2* 3.3* 3.0* 3.0*  CL 115*   < > 115* 114*   < > 115* 111 109 108 105  CO2 15*   < > 18* 18*   < > 21* 20* 23 24 24   GLUCOSE 860*   < > 232*  233*   < > 113* 158* 164* 186* 180*  BUN 26*   < > 25* 22*   < > 19 18 16 14 12   CREATININE 1.08   < > 1.16 1.05   < > 1.03 0.97 0.86 0.79 0.78  CALCIUM  8.7*   < > 8.8* 8.8*   < > 8.5* 8.5* 8.3* 8.1* 8.3*  MG  --    < > 2.2 2.1  --  1.7  --  1.8  --  1.5*  PHOS 1.6*  --   --  2.0*  --   --   --   --   --  2.5   < > = values in this interval not displayed.    GFR: Estimated Creatinine Clearance: 132.2 mL/min (by C-G formula based on SCr of 0.78 mg/dL).  Liver Function Tests: Recent Labs  Lab 03/30/24 2243 04/01/24 0830 04/02/24 0335  AST 26 18 18   ALT 23 15 16   ALKPHOS 128* 90 91  BILITOT 3.1* 1.4* 1.5*  PROT 8.1 6.1* 6.0*  ALBUMIN 4.0 3.1* 2.9*    CBG: Recent Labs  Lab 04/02/24 0818 04/02/24 0924 04/02/24 1027 04/02/24 1131 04/02/24 1520  GLUCAP 181* 208* 196* 208* 201*     Recent Results (from the past 240 hours)  MRSA Next Gen by PCR, Nasal     Status: None   Collection Time: 03/30/24 12:00 AM   Specimen: Nasal Mucosa; Nasal Swab  Result Value Ref Range Status   MRSA by PCR Next Gen NOT DETECTED NOT DETECTED Final    Comment: (NOTE) The GeneXpert MRSA Assay (FDA approved for NASAL specimens only), is one component of a comprehensive MRSA colonization surveillance program. It is not intended to diagnose MRSA infection nor to guide or monitor treatment  for MRSA infections. Test performance is not FDA approved in patients less than 39 years old. Performed at [facility], [address], [place], [place] [address]   Blood culture (routine x 2)     Status: None (Preliminary result)   Collection Time: 03/30/24  8:55 PM   Specimen: BLOOD  Result Value Ref Range Status   Specimen Description   Final    BLOOD BLOOD RIGHT ARM Performed at [facility], [address], [place], [place] [address]    Special Requests   Final    BOTTLES DRAWN AEROBIC AND ANAEROBIC Blood Culture results may not be optimal due to an inadequate volume of blood received in culture bottles Performed at [facility], [address], [place], [place] [address]    Culture   Final    NO GROWTH 3 DAYS Performed at [facility], [address], [place], [place] [address]    Report Status PENDING  Incomplete  Blood culture (routine x 2)     Status: None (Preliminary result)   Collection Time: 03/30/24 10:43 PM   Specimen: BLOOD  Result Value Ref Range Status   Specimen Description   Final    BLOOD BLOOD LEFT ARM Performed at [facility], [address], [place], [place] [address]    Special Requests   Final    BOTTLES DRAWN AEROBIC AND ANAEROBIC Blood Culture results may not be optimal due to an inadequate volume of blood received in culture bottles Performed at [facility], [address], [place], [place] [address]    Culture   Final    NO GROWTH 2 DAYS Performed at [facility], [address] [place], [place] [address]    Report Status PENDING  Incomplete  Resp panel by RT-PCR (RSV, Flu A&B, Covid)     Status: None   Collection Time: 03/30/24 10:43 PM   Specimen: Nasal Swab  Result Value Ref Range Status   SARS Coronavirus 2 by RT PCR NEGATIVE NEGATIVE Final    Comment: (NOTE) SARS-CoV-2 target nucleic acids are NOT DETECTED.  The SARS-CoV-2 RNA  is generally detectable in upper respiratory specimens during the acute phase of infection. The lowest concentration of SARS-CoV-2 viral copies this assay can detect is 138 copies/mL. A negative result does not preclude SARS-Cov-2 infection and should not be used as the sole basis for treatment or other patient management decisions. A negative result may occur with  improper specimen collection/handling, submission of specimen other than nasopharyngeal swab, presence of viral mutation(s) within the areas targeted by this assay, and inadequate number of viral copies(<138 copies/mL). A negative result must be combined with clinical observations, patient history, and epidemiological information. The expected result is Negative.  Fact Sheet for Patients:  BloggerCourse.com  Fact Sheet for Healthcare Providers:  SeriousBroker.it  This test is no t yet approved or cleared by the [place]  FDA and  has been authorized for detection and/or diagnosis of SARS-CoV-2 by FDA under an Emergency Use Authorization (EUA). This EUA will remain  in effect (meaning this test can be used) for the duration of the COVID-19 declaration under Section 564(b)(1) of the Act, 21 U.S.C.section 360bbb-3(b)(1), unless the authorization is terminated  or revoked sooner.       Influenza A by PCR NEGATIVE NEGATIVE Final   Influenza B by PCR NEGATIVE NEGATIVE Final    Comment: (NOTE) The Xpert Xpress SARS-CoV-2/FLU/RSV plus assay is intended as an aid in the diagnosis of influenza from Nasopharyngeal swab specimens and should not be used as a sole basis for treatment. Nasal washings and aspirates are unacceptable for Xpert Xpress SARS-CoV-2/FLU/RSV testing.  Fact Sheet for Patients: BloggerCourse.com  Fact Sheet for Healthcare Providers: SeriousBroker.it  This test is not yet approved or cleared by the  [place]  FDA and has been authorized for detection and/or diagnosis of SARS-CoV-2 by FDA under an Emergency Use Authorization (EUA). This EUA will remain in effect (meaning this test can be used) for the duration of the COVID-19 declaration under Section 564(b)(1) of the Act, 21 U.S.C. section 360bbb-3(b)(1), unless the authorization is terminated or revoked.     Resp Syncytial Virus by PCR NEGATIVE NEGATIVE Final    Comment: (NOTE) Fact Sheet for Patients: BloggerCourse.com  Fact Sheet for Healthcare Providers: SeriousBroker.it  This test is not yet approved or cleared by the [place]  FDA and has been authorized for detection and/or diagnosis of SARS-CoV-2 by FDA under an Emergency Use Authorization (EUA). This EUA will remain in effect (meaning this test can be used) for the duration of the COVID-19 declaration under Section 564(b)(1) of the Act, 21 U.S.C. section 360bbb-3(b)(1), unless the authorization is terminated or revoked.  Performed at [facility], [address], [place], [place] [address]          Radiology Studies: No results found.      Scheduled Meds:  Chlorhexidine  Gluconate Cloth  6 each Topical Daily   escitalopram   10 mg Oral QHS   heparin   5,000 Units Subcutaneous Q8H   insulin  aspart  0-15 Units Subcutaneous Q4H   insulin  glargine-yfgn  25 Units Subcutaneous Daily   pantoprazole  (  PROTONIX ) IV  40 mg Intravenous Daily   polyethylene glycol  17 g Oral BID   senna-docusate  1 tablet Oral BID   Continuous Infusions:  cefTRIAXone  (ROCEPHIN )  IV Stopped (04/02/24 0221)   insulin  Stopped (04/02/24 1026)   lactated ringers  125 mL/hr at 04/02/24 1224   ondansetron  (ZOFRAN ) IV Stopped (04/02/24 1156)     LOS: 3 days    Time spent: 40 mins    Toribio Hummer, MD [facility]   To contact the attending provider between 7A-7P or the covering provider during  after hours 7P-7A, please log into the web site [email or web address] and access using universal [place] password for that web site. If you do not have the password, please call the hospital operator.  04/02/2024, 4:38 PM

## 2024-04-03 ENCOUNTER — Other Ambulatory Visit: Payer: Self-pay

## 2024-04-03 ENCOUNTER — Encounter (HOSPITAL_COMMUNITY): Payer: Self-pay

## 2024-04-03 DIAGNOSIS — G9341 Metabolic encephalopathy: Secondary | ICD-10-CM | POA: Diagnosis not present

## 2024-04-03 DIAGNOSIS — E876 Hypokalemia: Secondary | ICD-10-CM | POA: Diagnosis not present

## 2024-04-03 DIAGNOSIS — K219 Gastro-esophageal reflux disease without esophagitis: Secondary | ICD-10-CM | POA: Diagnosis not present

## 2024-04-03 DIAGNOSIS — E1110 Type 2 diabetes mellitus with ketoacidosis without coma: Secondary | ICD-10-CM | POA: Diagnosis not present

## 2024-04-03 LAB — CBC
HCT: 36.3 % — ABNORMAL LOW (ref 39.0–52.0)
Hemoglobin: 11.3 g/dL — ABNORMAL LOW (ref 13.0–17.0)
MCH: 28.3 pg (ref 26.0–34.0)
MCHC: 31.1 g/dL (ref 30.0–36.0)
MCV: 91 fL (ref 80.0–100.0)
Platelets: 201 10*3/uL (ref 150–400)
RBC: 3.99 MIL/uL — ABNORMAL LOW (ref 4.22–5.81)
RDW: 13.4 % (ref 11.5–15.5)
WBC: 4.9 10*3/uL (ref 4.0–10.5)
nRBC: 0 % (ref 0.0–0.2)

## 2024-04-03 LAB — BASIC METABOLIC PANEL WITH GFR
Anion gap: 12 (ref 5–15)
BUN: 6 mg/dL (ref 6–20)
CO2: 24 mmol/L (ref 22–32)
Calcium: 7.8 mg/dL — ABNORMAL LOW (ref 8.9–10.3)
Chloride: 104 mmol/L (ref 98–111)
Creatinine, Ser: 0.84 mg/dL (ref 0.61–1.24)
GFR, Estimated: >60 mL/min (ref >60)
Glucose, Bld: 262 mg/dL — ABNORMAL HIGH (ref 70–99)
Potassium: 3.2 mmol/L — ABNORMAL LOW (ref 3.5–5.1)
Sodium: 140 mmol/L (ref 135–145)

## 2024-04-03 LAB — GLUCOSE, CAPILLARY
Glucose-Capillary: 245 mg/dL — ABNORMAL HIGH (ref 70–99)
Glucose-Capillary: 212 mg/dL — ABNORMAL HIGH (ref 70–99)
Glucose-Capillary: 127 mg/dL — ABNORMAL HIGH (ref 70–99)
Glucose-Capillary: 155 mg/dL — ABNORMAL HIGH (ref 70–99)
Glucose-Capillary: 224 mg/dL — ABNORMAL HIGH (ref 70–99)

## 2024-04-03 LAB — PHOSPHORUS: Phosphorus: 2.6 mg/dL (ref 2.5–4.6)

## 2024-04-03 LAB — MAGNESIUM: Magnesium: 2.1 mg/dL (ref 1.7–2.4)

## 2024-04-03 MED ORDER — NAPROXEN 375 MG PO TABS
375.0000 mg | ORAL_TABLET | Freq: Two times a day (BID) | ORAL | Status: DC
  Filled 2024-04-03: qty 1

## 2024-04-03 MED ORDER — OXYCODONE HCL 5 MG PO TABS
5.0000 mg | ORAL_TABLET | Freq: Four times a day (QID) | ORAL | Status: DC | PRN
  Administered 2024-04-03: 5 mg via ORAL
  Filled 2024-04-03: qty 1

## 2024-04-03 MED ORDER — PANTOPRAZOLE SODIUM 40 MG PO TBEC
40.0000 mg | DELAYED_RELEASE_TABLET | Freq: Every day | ORAL | Status: DC
  Administered 2024-04-03 – 2024-04-04 (×2): 40 mg via ORAL
  Filled 2024-04-03 (×2): qty 1

## 2024-04-03 MED ORDER — INSULIN ASPART 100 UNIT/ML IJ SOLN
6.0000 [IU] | Freq: Three times a day (TID) | INTRAMUSCULAR | Status: DC
  Administered 2024-04-03 – 2024-04-04 (×4): 6 [IU] via SUBCUTANEOUS

## 2024-04-03 MED ORDER — NAPROXEN 250 MG PO TABS
375.0000 mg | ORAL_TABLET | Freq: Two times a day (BID) | ORAL | Status: DC
  Administered 2024-04-03 – 2024-04-04 (×2): 375 mg via ORAL
  Filled 2024-04-03 (×3): qty 2

## 2024-04-03 MED ORDER — INSULIN GLARGINE-YFGN 100 UNIT/ML ~~LOC~~ SOLN
30.0000 [IU] | Freq: Every day | SUBCUTANEOUS | Status: DC
  Administered 2024-04-03: 30 [IU] via SUBCUTANEOUS
  Filled 2024-04-03 (×2): qty 0.3

## 2024-04-03 MED ORDER — POTASSIUM CHLORIDE CRYS ER 20 MEQ PO TBCR
40.0000 meq | EXTENDED_RELEASE_TABLET | ORAL | Status: AC
  Administered 2024-04-03 (×2): 40 meq via ORAL
  Filled 2024-04-03 (×2): qty 2

## 2024-04-03 MED ORDER — INSULIN ASPART 100 UNIT/ML IJ SOLN
0.0000 [IU] | Freq: Every day | INTRAMUSCULAR | Status: DC
  Administered 2024-04-03: 2 [IU] via SUBCUTANEOUS

## 2024-04-03 MED ORDER — INSULIN ASPART 100 UNIT/ML IJ SOLN
0.0000 [IU] | Freq: Three times a day (TID) | INTRAMUSCULAR | Status: DC
  Administered 2024-04-03: 3 [IU] via SUBCUTANEOUS
  Administered 2024-04-03: 2 [IU] via SUBCUTANEOUS
  Administered 2024-04-04 (×2): 5 [IU] via SUBCUTANEOUS

## 2024-04-03 NOTE — Progress Notes (Signed)
 Chaplain checked in while rounding - Pt was sleeping. Chaplains remain available if needed.

## 2024-04-03 NOTE — Progress Notes (Signed)
 Orthopedic Tech Progress Note Patient Details:  Arlind Klingerman [birth year] [id number]  Patient ID: Fonda Beverley North, male   DOB: 1981-07-30, 43 y.o.   MRN: [id number]  Waylan Thom Loving 04/03/2024, 10:28 AM Knee immobilizer placed in room for patient when OOB. Rn and patient advised.

## 2024-04-03 NOTE — Progress Notes (Signed)
 PROGRESS NOTE    Edward Walls  FMW:[id number] DOB: [birth year] DOA: 03/30/2024 PCP: Berneta Elsie Sayre, MD    Chief Complaint  Patient presents with   Hyperglycemia    Brief Narrative:  The patient is a 43 year old African-American male with type 1 diabetes mellitus was brought in by EMS after family was concerned of the do not hear from him for couple days so a welfare check was performed. EMS found him with a GCS of 7 had to put him on 12 L of nonrebreather mask. He is very altered and almost intubated but was protecting his airway was not intubated. He was started on initiation of DKA pathway. He was transition to subcutaneous insulin  overnight however still remained in DKA insulin  drip was initiated and multiple electrolyte abnormalities were being corrected.    Assessment & Plan:   Principal Problem:   DKA (diabetic ketoacidosis) (HCC) Active Problems:   Acute metabolic encephalopathy   Gastroesophageal reflux disease   Hypokalemia   Hypophosphatemia   Hypomagnesemia   AKI (acute kidney injury) (HCC)   SIRS (systemic inflammatory response syndrome) (HCC)   Constipation   Depression  #1 DKA -Patient had presented with altered mental status, noted to be in DKA and initially transition to subcutaneous insulin  however patient remained in DKA and had to be placed back on the Endo tool/glucose stabilizer. - Patient placed on IV fluids for aggressive hydration with continued CBG monitoring. - Beta hydroxybutyrate trending down. - Anion gap closed at 11, bicarb of 24. - Patient transitioned to Semglee  25 units daily and SSI yesterday. -CBG noted at 245 this morning. - Repeat hemoglobin A1c > 15.5 -Increase Semglee  to 30 units daily. -Increase meal coverage NovoLog  to 6 units 3 times daily with meals. -Continue carb modified diet. -Change CBGs to [facility] and at bedtime. -Continue carb modified diet. -Diabetes coordinator following.  2.  Acute metabolic  encephalopathy/mild hyperammonemia -Found likely secondary to DKA. - Patient noted at baseline to be alert oriented and following commands appropriately. - On admission patient noted to be altered and almost intubated however not done as patient noted to be protecting his airway. - Patient mental status has improved and back to baseline, patient alert oriented to self place and time.  3.  AKI -Likely secondary to prerenal azotemia with improvement with hydration. - Avoid nephrotoxins. -Saline lock IV fluids. - Follow.  4.  Hypokalemia/hypomagnesemia/hypophosphatemia -Potassium at 3.2, phosphorus at 2.6, magnesium  at 2.1. - K-Dur 40 mEq p.o. every 4 hours x 2 doses.. - Repeat labs in the AM.  5.  SIRS -Patient noted to have criteria for SIRS with a procalcitonin noted on admission of 6.88 without leukocytosis. - Urinalysis nitrite negative leukocytes negative. - Chest x-ray no acute abnormalities. - Blood cultures with no growth to date. - Continue empiric IV Rocephin  and treat empirically for total of 5 days.  6.  GERD/history of esophagitis - Continue PPI.  7.  History of depression - Continue Lexapro .   8.  Constipation -MiraLAX  twice daily, Senokot-S twice daily. - Sorbitol  p.o. every 3 hours x 2 doses.  9.  History of peripheral neuropathy - Continue home regimen Neurontin and Lyrica.  10.  Hyperbilirubinemia -Follow.  11.  Ligament tear right knee - Per patient who states he tore a ligament in his right knee a few weeks ago was placed in a knee immobilizer and crutches per orthopedics. - Ordering knee immobilizer. - PT/OT. - Outpatient follow-up with orthopedics.  DVT prophylaxis: Heparin  Code Status: Full  Family Communication: Updated patient.  No family at bedside. Disposition: Transfer to MedSurg.  Likely home when clinically improved.  Status is: Inpatient Remains inpatient appropriate because: Severity of illness   Consultants:  PCCM  Procedures:   CT head 03/30/2024 Chest x-ray 03/30/2024   Antimicrobials:  Anti-infectives (From admission, onward)    Start     Dose/Rate Route Frequency Ordered Stop   04/01/24 1200  vancomycin  (VANCOREADY) IVPB 1250 mg/250 mL  Status:  Discontinued        1,250 mg 166.7 mL/hr over 90 Minutes Intravenous Every 36 hours 03/31/24 0537 03/31/24 1241   03/31/24 0045  vancomycin  (VANCOCIN ) IVPB 1000 mg/200 mL premix        1,000 mg 200 mL/hr over 60 Minutes Intravenous  Once 03/30/24 2349 03/31/24 0344   03/30/24 2337  cefTRIAXone  (ROCEPHIN ) 2 g in sodium chloride  0.9 % 100 mL IVPB        2 g 200 mL/hr over 30 Minutes Intravenous Every 24 hours 03/30/24 2337           Subjective: Patient laying in bed.  States he feels significantly better today.  Denies any chest pain or shortness of breath.  No abdominal pain.  Stated had a good breakfast this morning.  Overall notes that he recently had a torn ligament in his right knee and has been using crutches for mobility and is to follow-up with orthopedics in the outpatient setting.    Objective: Vitals:   04/03/24 0700 04/03/24 0715 04/03/24 0800 04/03/24 0900  BP: (!) 150/97  (!) 147/87 (!) 143/84  Pulse: 79 85 91 (!) 106  Resp: 14 (!) 28 (!) 21 (!) 21  Temp:   98.2 F (36.8 C)   TempSrc:   Oral   SpO2: 100% 100% 100% 100%  Weight:      Height:        Intake/Output Summary (Last 24 hours) at 04/03/2024 1008 Last data filed at 04/03/2024 0802 Gross per 24 hour  Intake 2554.97 ml  Output 2400 ml  Net 154.97 ml   Filed Weights   04/01/24 0406 04/02/24 0500 04/03/24 0500  Weight: 72.8 kg 76.9 kg 80 kg    Examination:  General exam: NAD Respiratory system: CTAB.  No wheezes, no crackles, no rhonchi.  Fair air movement.  Speaking in full sentences.   Cardiovascular system: Regular rate rhythm no murmurs rubs or gallops.  No JVD.  No lower extremity edema.  Gastrointestinal system: Abdomen is soft, nontender, nondistended, positive bowel  sounds.  No rebound.  No guarding.  Central nervous system: Alert and oriented. No focal neurological deficits. Extremities: Symmetric 5 x 5 power. Skin: No rashes, lesions or ulcers Psychiatry: Judgement and insight appear normal. Mood & affect appropriate.     Data Reviewed: I have personally reviewed following labs and imaging studies  CBC: Recent Labs  Lab 03/30/24 2040 03/30/24 2042 03/31/24 0755 04/01/24 0330 04/01/24 0830 04/02/24 0335 04/03/24 0334  WBC 18.4*   < > 14.5* 8.7 8.2 6.6 4.9  NEUTROABS 14.7*  --   --   --  6.0 4.0  --   HGB 13.5   < > 14.6 13.2 12.9* 12.2* 11.3*  HCT 46.6   < > 45.6 41.2 40.4 39.2 36.3*  MCV 98.3   < > 88.4 88.6 88.2 91.0 91.0  PLT 400   < > 286 266 273 247 201   < > = values in this interval not displayed.    Basic Metabolic Panel:  Recent Labs  Lab 03/31/24 1013 03/31/24 1015 04/01/24 0830 04/01/24 0919 04/01/24 1213 04/01/24 1718 04/01/24 2104 04/02/24 0023 04/02/24 0335 04/03/24 0334  NA 146*   < > 149*   < > 149* 147* 144 143 140 140  K 4.0   < > 3.6   < > 3.3* 3.2* 3.3* 3.0* 3.0* 3.2*  CL 115*   < > 114*   < > 115* 111 109 108 105 104  CO2 15*   < > 18*   < > 21* 20* 23 24 24 24   GLUCOSE 860*   < > 233*   < > 113* 158* 164* 186* 180* 262*  BUN 26*   < > 22*   < > 19 18 16 14 12 6   CREATININE 1.08   < > 1.05   < > 1.03 0.97 0.86 0.79 0.78 0.84  CALCIUM  8.7*   < > 8.8*   < > 8.5* 8.5* 8.3* 8.1* 8.3* 7.8*  MG  --    < > 2.1  --  1.7  --  1.8  --  1.5* 2.1  PHOS 1.6*  --  2.0*  --   --   --   --   --  2.5 2.6   < > = values in this interval not displayed.    GFR: Estimated Creatinine Clearance: 131 mL/min (by C-G formula based on SCr of 0.84 mg/dL).  Liver Function Tests: Recent Labs  Lab 03/30/24 2243 04/01/24 0830 04/02/24 0335  AST 26 18 18   ALT 23 15 16   ALKPHOS 128* 90 91  BILITOT 3.1* 1.4* 1.5*  PROT 8.1 6.1* 6.0*  ALBUMIN 4.0 3.1* 2.9*    CBG: Recent Labs  Lab 04/02/24 1520 04/02/24 1941  04/02/24 2332 04/03/24 0334 04/03/24 0744  GLUCAP 201* 160* 146* 245* 212*     Recent Results (from the past 240 hours)  MRSA Next Gen by PCR, Nasal     Status: None   Collection Time: 03/30/24 12:00 AM   Specimen: Nasal Mucosa; Nasal Swab  Result Value Ref Range Status   MRSA by PCR Next Gen NOT DETECTED NOT DETECTED Final    Comment: (NOTE) The GeneXpert MRSA Assay (FDA approved for NASAL specimens only), is one component of a comprehensive MRSA colonization surveillance program. It is not intended to diagnose MRSA infection nor to guide or monitor treatment for MRSA infections. Test performance is not FDA approved in patients less than 43 years old. Performed at [facility], [address], [place], [place] [address]   Blood culture (routine x 2)     Status: None (Preliminary result)   Collection Time: 03/30/24  8:55 PM   Specimen: BLOOD  Result Value Ref Range Status   Specimen Description   Final    BLOOD BLOOD RIGHT ARM Performed at [facility], [address], [place], [place] [address]    Special Requests   Final    BOTTLES DRAWN AEROBIC AND ANAEROBIC Blood Culture results may not be optimal due to an inadequate volume of blood received in culture bottles Performed at [facility], [address], [place], [place] [address]    Culture   Final    NO GROWTH 4 DAYS Performed at [facility], [address], [place], [place] [address]    Report Status PENDING  Incomplete  Blood culture (routine x 2)     Status: None (Preliminary result)   Collection Time: 03/30/24 10:43  PM   Specimen: BLOOD  Result Value Ref Range Status   Specimen Description   Final    BLOOD BLOOD LEFT ARM Performed at [facility], [address], [place], [place] [address]    Special Requests   Final    BOTTLES DRAWN AEROBIC AND ANAEROBIC Blood Culture results may not be optimal due to an inadequate volume of  blood received in culture bottles Performed at [facility], [address], [place], [place] [address]    Culture   Final    NO GROWTH 3 DAYS Performed at [facility], [address], [place], [place] [address]    Report Status PENDING  Incomplete  Resp panel by RT-PCR (RSV, Flu A&B, Covid)     Status: None   Collection Time: 03/30/24 10:43 PM   Specimen: Nasal Swab  Result Value Ref Range Status   SARS Coronavirus 2 by RT PCR NEGATIVE NEGATIVE Final    Comment: (NOTE) SARS-CoV-2 target nucleic acids are NOT DETECTED.  The SARS-CoV-2 RNA is generally detectable in upper respiratory specimens during the acute phase of infection. The lowest concentration of SARS-CoV-2 viral copies this assay can detect is 138 copies/mL. A negative result does not preclude SARS-Cov-2 infection and should not be used as the sole basis for treatment or other patient management decisions. A negative result may occur with  improper specimen collection/handling, submission of specimen other than nasopharyngeal swab, presence of viral mutation(s) within the areas targeted by this assay, and inadequate number of viral copies(<138 copies/mL). A negative result must be combined with clinical observations, patient history, and epidemiological information. The expected result is Negative.  Fact Sheet for Patients:  BloggerCourse.com  Fact Sheet for Healthcare Providers:  SeriousBroker.it  This test is no t yet approved or cleared by the [place]  FDA and  has been authorized for detection and/or diagnosis of SARS-CoV-2 by FDA under an Emergency Use Authorization (EUA). This EUA will remain  in effect (meaning this test can be used) for the duration of the COVID-19 declaration under Section 564(b)(1) of the Act, 21 U.S.C.section 360bbb-3(b)(1), unless the authorization is terminated  or revoked sooner.       Influenza A by  PCR NEGATIVE NEGATIVE Final   Influenza B by PCR NEGATIVE NEGATIVE Final    Comment: (NOTE) The Xpert Xpress SARS-CoV-2/FLU/RSV plus assay is intended as an aid in the diagnosis of influenza from Nasopharyngeal swab specimens and should not be used as a sole basis for treatment. Nasal washings and aspirates are unacceptable for Xpert Xpress SARS-CoV-2/FLU/RSV testing.  Fact Sheet for Patients: BloggerCourse.com  Fact Sheet for Healthcare Providers: SeriousBroker.it  This test is not yet approved or cleared by the [place]  FDA and has been authorized for detection and/or diagnosis of SARS-CoV-2 by FDA under an Emergency Use Authorization (EUA). This EUA will remain in effect (meaning this test can be used) for the duration of the COVID-19 declaration under Section 564(b)(1) of the Act, 21 U.S.C. section 360bbb-3(b)(1), unless the authorization is terminated or revoked.     Resp Syncytial Virus by PCR NEGATIVE NEGATIVE Final    Comment: (NOTE) Fact Sheet for Patients: BloggerCourse.com  Fact Sheet for Healthcare Providers: SeriousBroker.it  This test is not yet approved or cleared by the [place]  FDA and has been authorized for detection and/or diagnosis of SARS-CoV-2 by FDA under an Emergency Use Authorization (EUA). This EUA will remain in effect (meaning this test can be used) for the duration  of the COVID-19 declaration under Section 564(b)(1) of the Act, 21 U.S.C. section 360bbb-3(b)(1), unless the authorization is terminated or revoked.  Performed at [facility], [address], [place], [place] [address]          Radiology Studies: No results found.      Scheduled Meds:  Chlorhexidine  Gluconate Cloth  6 each Topical Daily   escitalopram   10 mg Oral QHS   heparin   5,000 Units Subcutaneous Q8H   insulin  aspart  0-15 Units  Subcutaneous TID WC   insulin  aspart  0-5 Units Subcutaneous QHS   insulin  aspart  6 Units Subcutaneous TID WC   insulin  glargine-yfgn  30 Units Subcutaneous Daily   pantoprazole   40 mg Oral Daily   polyethylene glycol  17 g Oral BID   potassium chloride   40 mEq Oral Q4H   senna-docusate  1 tablet Oral BID   Continuous Infusions:  cefTRIAXone  (ROCEPHIN )  IV Stopped (04/03/24 0300)   ondansetron  (ZOFRAN ) IV Stopped (04/02/24 1156)     LOS: 4 days    Time spent: 40 mins    Toribio Hummer, MD [facility]   To contact the attending provider between 7A-7P or the covering provider during after hours 7P-7A, please log into the web site [email or web address] and access using universal [place] password for that web site. If you do not have the password, please call the hospital operator.  04/03/2024, 10:08 AM

## 2024-04-04 DIAGNOSIS — N179 Acute kidney failure, unspecified: Secondary | ICD-10-CM | POA: Diagnosis not present

## 2024-04-04 DIAGNOSIS — F32A Depression, unspecified: Secondary | ICD-10-CM | POA: Diagnosis not present

## 2024-04-04 DIAGNOSIS — E1110 Type 2 diabetes mellitus with ketoacidosis without coma: Secondary | ICD-10-CM | POA: Diagnosis not present

## 2024-04-04 LAB — CULTURE, BLOOD (ROUTINE X 2): Culture: NO GROWTH

## 2024-04-04 LAB — RENAL FUNCTION PANEL
Albumin: 2.6 g/dL — ABNORMAL LOW (ref 3.5–5.0)
Anion gap: 10 (ref 5–15)
BUN: 9 mg/dL (ref 6–20)
CO2: 25 mmol/L (ref 22–32)
Calcium: 8.3 mg/dL — ABNORMAL LOW (ref 8.9–10.3)
Chloride: 103 mmol/L (ref 98–111)
Creatinine, Ser: 0.68 mg/dL (ref 0.61–1.24)
GFR, Estimated: >60 mL/min (ref >60)
Glucose, Bld: 235 mg/dL — ABNORMAL HIGH (ref 70–99)
Phosphorus: 4.3 mg/dL (ref 2.5–4.6)
Potassium: 4.1 mmol/L (ref 3.5–5.1)
Sodium: 138 mmol/L (ref 135–145)

## 2024-04-04 LAB — GLUCOSE, CAPILLARY
Glucose-Capillary: 238 mg/dL — ABNORMAL HIGH (ref 70–99)
Glucose-Capillary: 204 mg/dL — ABNORMAL HIGH (ref 70–99)

## 2024-04-04 LAB — MAGNESIUM: Magnesium: 1.9 mg/dL (ref 1.7–2.4)

## 2024-04-04 MED ORDER — POLYETHYLENE GLYCOL 3350 17 G PO PACK: 17.0000 g | PACK | Freq: Two times a day (BID) | ORAL | 0 refills | Status: DC

## 2024-04-04 MED ORDER — ESCITALOPRAM OXALATE 10 MG PO TABS: 10.0000 mg | ORAL_TABLET | Freq: Every day | ORAL | 1 refills | Status: DC

## 2024-04-04 MED ORDER — PANTOPRAZOLE SODIUM 40 MG PO TBEC: 40.0000 mg | DELAYED_RELEASE_TABLET | Freq: Every day | ORAL | 1 refills | Status: DC

## 2024-04-04 MED ORDER — DEXCOM G6 SENSOR MISC: 34.0000 [IU] | Freq: Every day | 0 refills | Status: AC

## 2024-04-04 MED ORDER — INSULIN GLARGINE-YFGN 100 UNIT/ML ~~LOC~~ SOLN
34.0000 [IU] | Freq: Every day | SUBCUTANEOUS | Status: DC
  Administered 2024-04-04: 34 [IU] via SUBCUTANEOUS
  Filled 2024-04-04: qty 0.34

## 2024-04-04 MED ORDER — SENNOSIDES-DOCUSATE SODIUM 8.6-50 MG PO TABS: 1.0000 | ORAL_TABLET | Freq: Two times a day (BID) | ORAL | Status: DC

## 2024-04-04 NOTE — Evaluation (Signed)
 Physical Therapy Evaluation Patient Details Name: Edward Walls MRN: [id number] DOB: July 29, 1981 Today's Date: 04/04/2024  History of Present Illness  43 year old African-American male with type 1 diabetes mellitus was brought in by EMS after family was concerned of the do not hear from him for couple days so a welfare check was performed. Dx of DKA, acute metabolic encephalopathy, AKI,  multiple electrolyte abnormalities,   knee ligament tear 2 weeks ago (KI and crutches per ortho).  Clinical Impression  Pt reports he sustained a R knee ligament tear 1.5-2 weeks ago, he's not sure which ligament it was, he stated he was told to wear a knee immobilizer at all times. Pt's knee immobilizer is at home and he did not want to be charged for another one here so he is maintaining R knee in fully extended position at all times until his friend brings in the KI later today. Pt reports he was ambulating independently at home with crutches and KI and was able to go up/down a flight of stairs to his bedroom. Pt ambulated 170' with RW, with R knee fully extended,  no loss of balance. Pt reports he was to f/u with orthopedics following ED visit for R knee but hasn't scheduled that yet. Pt was encouraged to do so. Pt stated he has phone number for Dr Charol Copas in his my chart account. From a PT standpoint he is ready to DC home.       If plan is discharge home, recommend the following: Assist for transportation   Can travel by private vehicle        Equipment Recommendations None recommended by PT  Recommendations for Other Services       Functional Status Assessment Patient has not had a recent decline in their functional status     Precautions / Restrictions Precautions Precautions: Other (comment) Recall of Precautions/Restrictions: Intact Precaution/Restrictions Comments: R knee no flexion, pt reports he was told to wear KI at all times during [facility] ED visit 1.5-2 weeks ago (not able to find these  notes), pt to schedule f/u appt with Dr. Charol Copas per my chart per pt (pt hasn't done this yet so I encouraged him to do so) Restrictions Weight Bearing Restrictions Per Provider Order: No      Mobility  Bed Mobility Overal bed mobility: Independent                  Transfers Overall transfer level: Modified independent Equipment used: Rolling walker (2 wheels)                    Ambulation/Gait Ambulation/Gait assistance: Modified independent (Device/Increase time) Gait Distance (Feet): 170 Feet Assistive device: Rolling walker (2 wheels) Gait Pattern/deviations: Step-to pattern Gait velocity: decr     General Gait Details: pt maintained R knee in fully extended position, no loss of balance  Stairs            Wheelchair Mobility     Tilt Bed    Modified Rankin (Stroke Patients Only)       Balance Overall balance assessment: Modified Independent                                           Pertinent Vitals/Pain Pain Assessment Pain Assessment: No/denies pain    Home Living Family/patient expects to be discharged to:: Private residence Living Arrangements: Non-relatives/Friends Available Help at  Discharge: Friend(s);Available PRN/intermittently Type of Home: House Home Access: Stairs to enter   Entrance Stairs-Number of Steps: 1 Alternate Level Stairs-Number of Steps: flight Home Layout: Two level;Able to live on main level with bedroom/bathroom Home Equipment: Crutches Additional Comments: knee immobilizer    Prior Function Prior Level of Function : Independent/Modified Independent             Mobility Comments: walked with crutches and KI independently, was able to go up flight of stairs; works at [employer] ADLs Comments: independent     Extremity/Trunk Assessment   Upper Extremity Assessment Upper Extremity Assessment: Overall WFL for tasks assessed    Lower Extremity  Assessment Lower Extremity Assessment: Overall WFL for tasks assessed;RLE deficits/detail RLE Deficits / Details: pt told to wear KI at all times during recent ED visit for torn ligament in knee (pt doesn't know which ligament); able to perform SLR; knee NT RLE: Unable to fully assess due to immobilization RLE Sensation: WNL RLE Coordination: WNL    Cervical / Trunk Assessment Cervical / Trunk Assessment: Normal  Communication   Communication Communication: No apparent difficulties    Cognition Arousal: Alert Behavior During Therapy: WFL for tasks assessed/performed   PT - Cognitive impairments: No apparent impairments                         Following commands: Intact       Cueing       General Comments      Exercises     Assessment/Plan    PT Assessment Patient does not need any further PT services  PT Problem List         PT Treatment Interventions      PT Goals (Current goals can be found in the Care Plan section)  Acute Rehab PT Goals PT Goal Formulation: All assessment and education complete, DC therapy    Frequency       Co-evaluation               AM-PAC PT 6 Clicks Mobility  Outcome Measure Help needed turning from your back to your side while in a flat bed without using bedrails?: None Help needed moving from lying on your back to sitting on the side of a flat bed without using bedrails?: None Help needed moving to and from a bed to a chair (including a wheelchair)?: None Help needed standing up from a chair using your arms (e.g., wheelchair or bedside chair)?: None Help needed to walk in hospital room?: None Help needed climbing 3-5 steps with a railing? : None 6 Click Score: 24    End of Session Equipment Utilized During Treatment: Gait belt Activity Tolerance: Patient tolerated treatment well Patient left: in chair;with call bell/phone within reach Nurse Communication: Mobility status      Time: 8413-2440 PT Time  Calculation (min) (ACUTE ONLY): 34 min   Charges:   PT Evaluation $PT Eval Moderate Complexity: 1 Mod PT Treatments $Gait Training: 8-22 mins PT General Charges $$ ACUTE PT VISIT: 1 Visit        Daymon Evans PT 04/04/2024  Acute Rehabilitation Services  Office [phone]

## 2024-04-04 NOTE — Plan of Care (Signed)

## 2024-04-04 NOTE — Progress Notes (Signed)
 OT Cancellation Note  Patient Details Name: Tyreck Bell MRN: [id number] DOB: [birth year]   Cancelled Treatment:    Reason Eval/Treat Not Completed: OT screened, no needs identified, will sign off. OT spoke with the pt's evaluating PT who stated the pt is without functional deficits that would warrant the need for therapy services. OT will sign off. Thank you.    Sheralyn Dies, OTR/L 04/04/2024, 12:14 PM

## 2024-04-04 NOTE — Plan of Care (Signed)
 Patient ready for discharge.

## 2024-04-04 NOTE — Discharge Summary (Signed)
 Physician Discharge Summary  Edward Walls OZH:[id number] DOB: March 20, 1981 DOA: 03/30/2024  PCP: Tonna Frederic, MD  Admit date: 03/30/2024 Discharge date: 04/04/2024  Time spent: 60 minutes  Recommendations for Outpatient Follow-up:  Follow-up with Tonna Frederic, MD in 2 weeks.  On follow-up patient will need a basic metabolic profile, magnesium  level, phosphorus level done to follow-up on electrolytes and renal function.  Patient's diabetes will need to be followed up upon.  Chronic other medical issues will need to be followed up upon. Follow-up with Dr. Aldona Amel, endocrinology in 2 to 3 weeks. Follow-up with Dr. Charol Copas, orthopedics in 1 to 2 weeks.   Discharge Diagnoses:  Principal Problem:   DKA (diabetic ketoacidosis) (HCC) Active Problems:   Acute metabolic encephalopathy   Gastroesophageal reflux disease   Hypokalemia   Hypophosphatemia   Hypomagnesemia   AKI (acute kidney injury) (HCC)   SIRS (systemic inflammatory response syndrome) (HCC)   Constipation   Depression   Discharge Condition: Stable and improved.  Diet recommendation: Carb modified diet  Filed Weights   04/02/24 0500 04/03/24 0500 04/04/24 0500  Weight: 76.9 kg 80 kg 80 kg    History of present illness:  HPI per Dr. Mason Sole 43 y/o with h/o DMT-I who was BIB EMS after they went there for a welfare check as family had not heard form him in a couple days.  EMS found him with GCS 7, and put him on 12 L NRM.  In the ED he was very Altered and was almost intubated but then seemed to be protecting his airways and so was not intubated.  Patient started on DKA pathway in ED. His PH <6.95, K 6.6, Glu >700, LA 3.3, Cr 2.40, WBC 18, cxr no infiltrates, urine no bacteria.  Hospital Course:  #1 DKA -Patient had presented with altered mental status, noted to be in DKA and initially transition to subcutaneous insulin  however patient remained in DKA and had to be placed back on the Endo tool/glucose  stabilizer. - Patient placed on IV fluids for aggressive hydration with continued CBG monitoring. - Beta hydroxybutyrate trended down. - Anion gap closed at 11, bicarb of 24. - Patient transitioned to Semglee  25 units daily and SSI and meal coverage NovoLog . - Semglee  dose uptitrated back to home regimen of 34 units daily as patient's oral intake improved during the hospitalization. - Repeat hemoglobin A1c > 15.5. -Patient seen by diabetic coordinator during the hospitalization. -Patient be discharged in stable and improved condition with outpatient follow-up with PCP and primary endocrinologist.   2.  Acute metabolic encephalopathy/mild hyperammonemia - likely secondary to DKA. - Patient noted at baseline to be alert oriented and following commands appropriately. - On admission patient noted to be altered and almost intubated however not done as patient noted to be protecting his airway. - Patient mental status improved significantly during the hospitalization and patient was back to baseline by day of discharge.    3.  AKI -Likely secondary to prerenal azotemia with improvement with hydration and resolution of AKI by day of discharge.   4.  Hypokalemia/hypomagnesemia/hypophosphatemia - Repleted during the hospitalization.   - Outpatient follow-up with PCP.     5.  SIRS -Patient noted to have criteria for SIRS with a procalcitonin noted on admission of 6.88 without leukocytosis. - Urinalysis nitrite negative leukocytes negative. - Chest x-ray no acute abnormalities. - Blood cultures with no growth to date. - Patient received a 5-day course of IV Rocephin  during the hospitalization and no further  antibiotics needed on discharge.    6.  GERD/history of esophagitis - Patient maintained on PPI.   7.  History of depression - Patient maintained on home regimen Lexapro .     8.  Constipation - Patient placed on a bowel regimen of MiraLAX  twice daily, Senokot-S twice daily.   - Patient  also noted to have received oral sorbitol with good results.  -Patient will be discharged on a bowel regimen.   - Outpatient follow-up with PCP.   9.  History of peripheral neuropathy - Patient maintained on home regimen Neurontin  and Lyrica.   10.  Hyperbilirubinemia -Follow.   11.  Ligament tear right knee - Per patient who states he tore a ligament in his right knee a few weeks ago was placed in a knee immobilizer and crutches per orthopedics, - PT/OT assessed patient during the hospitalization.. - Outpatient follow-up with orthopedics.  Procedures: CT head 03/30/2024 Chest x-ray 03/30/2024    Consultations: PCCM  Discharge Exam: Vitals:   04/03/24 2127 04/04/24 0125  BP: 120/79 114/71  Pulse: 82 82  Resp: 14 14  Temp: 98.8 F (37.1 C) 98.7 F (37.1 C)  SpO2: 100% 100%    General: NAD Cardiovascular: RRR no murmurs rubs or gallops.  No JVD.  No lower extremity edema. Respiratory: Clear to auscultation bilaterally.  No wheezes, no crackles, no rhonchi.  Fair air movement.  Speaking in full sentences.  Discharge Instructions   Discharge Instructions     Diet Carb Modified   Complete by: As directed    Increase activity slowly   Complete by: As directed       Allergies as of 04/04/2024       Reactions   Other Other (See Comments)   All nuts Throat pain   Peanut (diagnostic) Swelling   Swelling of the throat   Peanut-containing Drug Products Other (See Comments)   Throat pain        Medication List     STOP taking these medications    IBU 600 MG tablet Generic drug: ibuprofen        TAKE these medications    Dexcom G6 Sensor Misc 34 Units by Does not apply route daily. by Does not apply route. What changed:  how much to take when to take this additional instructions   escitalopram  10 MG tablet Commonly known as: LEXAPRO  Take 1 tablet (10 mg total) by mouth daily.   Lantus  SoloStar 100 UNIT/ML Solostar Pen Generic drug: insulin   glargine Inject 34 Units into the skin daily.   Lyumjev  100 UNIT/ML Soln Generic drug: Insulin  Lispro-aabc Inject 12 Units as directed 3 (three) times daily with meals.   naproxen  375 MG tablet Commonly known as: NAPROSYN  Take 375 mg by mouth 2 (two) times daily.   pantoprazole  40 MG tablet Commonly known as: PROTONIX  Take 1 tablet (40 mg total) by mouth daily. Start taking on: April 05, 2024   polyethylene glycol 17 g packet Commonly known as: MIRALAX  / GLYCOLAX  Take 17 g by mouth 2 (two) times daily.   senna-docusate 8.6-50 MG tablet Commonly known as: Senokot-S Take 1 tablet by mouth 2 (two) times daily.       Allergies  Allergen Reactions   Other Other (See Comments)    All nuts Throat pain   Peanut (Diagnostic) Swelling    Swelling of the throat   Peanut-Containing Drug Products Other (See Comments)    Throat pain    Follow-up Information     Christianna Cowman  Russ Course, MD. Schedule an appointment as soon as possible for a visit in 2 week(s).   Specialty: Family Medicine Contact information: [address] [place] [address] [phone]         Emilie Harden, MD. Schedule an appointment as soon as possible for a visit in 2 week(s).   Specialty: Internal Medicine Why: Follow-up in 2 to 3 weeks Contact information: [address] [place] [address] [phone]         Adonica Hoose, MD. Schedule an appointment as soon as possible for a visit in 2 week(s).   Specialty: Orthopedic Surgery Why: f/u in 1-2 weeks. Contact information: [address] [place] [address] [phone]                  The results of significant diagnostics from this hospitalization (including imaging, microbiology, ancillary and laboratory) are listed below for reference.    Significant Diagnostic Studies: CT HEAD WO CONTRAST ( ) Result Date: 03/30/2024 EXAM: CT HEAD WITHOUT CONTRAST 03/30/2024  11:29:06 PM TECHNIQUE: CT of the head was performed without the administration of intravenous contrast. Automated exposure control, iterative reconstruction, and/or weight based adjustment of the mA/kV was utilized to reduce the radiation dose to as low as reasonably achievable. COMPARISON: 06/04/2020 CLINICAL HISTORY: Mental status change, persistent or worsening. AMS, hyperglycemia. EMS was called out for welfare check. Pt found with GCS of 7 and blood sugar reading high. FINDINGS: BRAIN AND VENTRICLES: There is no acute intracranial hemorrhage, mass effect or midline shift. No abnormal extra-axial fluid collection. The gray-white differentiation is maintained without an acute infarct. There is no hydrocephalus. ORBITS: The visualized portion of the orbits demonstrate no acute abnormality. SINUSES: The visualized paranasal sinuses and mastoid air cells demonstrate no acute abnormality. SOFT TISSUES AND SKULL: No acute abnormality of the visualized skull or soft tissues. IMPRESSION: 1. No acute intracranial abnormality. Electronically signed by: Zadie Herter MD 03/30/2024 11:45 PM EDT RP Workstation: [id number]   DG Chest Portable 1 View Result Date: 03/30/2024 CLINICAL DATA:  DKA, found down EXAM: PORTABLE CHEST 1 VIEW COMPARISON:  05/16/2020 FINDINGS: The heart size and mediastinal contours are within normal limits. Both lungs are clear. The visualized skeletal structures are unremarkable. IMPRESSION: No active disease. Electronically Signed   By: Bobbye Burrow M.D.   On: 03/30/2024 21:34   DG Knee Complete 4 Views Right Result Date: 03/20/2024 CLINICAL DATA:  Right knee pain.  Twisting injury. EXAM: RIGHT KNEE - COMPLETE 4+ VIEW COMPARISON:  None Available. FINDINGS: No acute fracture or dislocation. The alignment and joint spaces are maintained. Small joint effusion but no lipohemarthrosis. No erosive change or focal bone abnormality. Mild soft tissue edema. IMPRESSION: Mild soft tissue edema and  small joint effusion. No acute fracture or dislocation. Electronically Signed   By: Chadwick Colonel M.D.   On: 03/20/2024 21:21    Microbiology: Recent Results (from the past 240 hours)  MRSA Next Gen by PCR, Nasal     Status: None   Collection Time: 03/30/24 12:00 AM   Specimen: Nasal Mucosa; Nasal Swab  Result Value Ref Range Status   MRSA by PCR Next Gen NOT DETECTED NOT DETECTED Final    Comment: (NOTE) The GeneXpert MRSA Assay (FDA approved for NASAL specimens only), is one component of a comprehensive MRSA colonization surveillance program. It is not intended to diagnose MRSA infection nor to guide or monitor treatment for MRSA infections. Test performance is not FDA approved in patients less  than 74 years old. Performed at [facility], [address] [place], [place] [address]   Blood culture (routine x 2)     Status: None   Collection Time: 03/30/24  8:55 PM   Specimen: BLOOD  Result Value Ref Range Status   Specimen Description   Final    BLOOD BLOOD RIGHT ARM Performed at [facility], [address], [place], [place] [address]    Special Requests   Final    BOTTLES DRAWN AEROBIC AND ANAEROBIC Blood Culture results may not be optimal due to an inadequate volume of blood received in culture bottles Performed at [facility], [address], [place], [place] [address]    Culture   Final    NO GROWTH 5 DAYS Performed at [facility], [address], [place], [place] [address]    Report Status 04/04/2024 FINAL  Final  Blood culture (routine x 2)     Status: None (Preliminary result)   Collection Time: 03/30/24 10:43 PM   Specimen: BLOOD  Result Value Ref Range Status   Specimen Description   Final    BLOOD BLOOD LEFT ARM Performed at [facility], [address] [place], [place] [address]    Special Requests   Final    BOTTLES DRAWN AEROBIC AND ANAEROBIC Blood Culture results may  not be optimal due to an inadequate volume of blood received in culture bottles Performed at [facility], [address] [place], [place] [address]    Culture   Final    NO GROWTH 4 DAYS Performed at [facility], [address], [place], [place] [address]    Report Status PENDING  Incomplete  Resp panel by RT-PCR (RSV, Flu A&B, Covid)     Status: None   Collection Time: 03/30/24 10:43 PM   Specimen: Nasal Swab  Result Value Ref Range Status   SARS Coronavirus 2 by RT PCR NEGATIVE NEGATIVE Final    Comment: (NOTE) SARS-CoV-2 target nucleic acids are NOT DETECTED.  The SARS-CoV-2 RNA is generally detectable in upper respiratory specimens during the acute phase of infection. The lowest concentration of SARS-CoV-2 viral copies this assay can detect is 138 copies/mL. A negative result does not preclude SARS-Cov-2 infection and should not be used as the sole basis for treatment or other patient management decisions. A negative result may occur with  improper specimen collection/handling, submission of specimen other than nasopharyngeal swab, presence of viral mutation(s) within the areas targeted by this assay, and inadequate number of viral copies(<138 copies/mL). A negative result must be combined with clinical observations, patient history, and epidemiological information. The expected result is Negative.  Fact Sheet for Patients:  BloggerCourse.com  Fact Sheet for Healthcare Providers:  SeriousBroker.it  This test is no t yet approved or cleared by the [place]  FDA and  has been authorized for detection and/or diagnosis of SARS-CoV-2 by FDA under an Emergency Use Authorization (EUA). This EUA will remain  in effect (meaning this test can be used) for the duration of the COVID-19 declaration under Section 564(b)(1) of the Act, 21 U.S.C.section 360bbb-3(b)(1), unless the authorization is terminated   or revoked sooner.       Influenza A by PCR NEGATIVE NEGATIVE Final   Influenza B by PCR NEGATIVE NEGATIVE Final    Comment: (NOTE) The Xpert Xpress SARS-CoV-2/FLU/RSV plus assay is intended as an aid in the diagnosis of influenza from Nasopharyngeal swab specimens and  should not be used as a sole basis for treatment. Nasal washings and aspirates are unacceptable for Xpert Xpress SARS-CoV-2/FLU/RSV testing.  Fact Sheet for Patients: BloggerCourse.com  Fact Sheet for Healthcare Providers: SeriousBroker.it  This test is not yet approved or cleared by the [place]  FDA and has been authorized for detection and/or diagnosis of SARS-CoV-2 by FDA under an Emergency Use Authorization (EUA). This EUA will remain in effect (meaning this test can be used) for the duration of the COVID-19 declaration under Section 564(b)(1) of the Act, 21 U.S.C. section 360bbb-3(b)(1), unless the authorization is terminated or revoked.     Resp Syncytial Virus by PCR NEGATIVE NEGATIVE Final    Comment: (NOTE) Fact Sheet for Patients: BloggerCourse.com  Fact Sheet for Healthcare Providers: SeriousBroker.it  This test is not yet approved or cleared by the [place]  FDA and has been authorized for detection and/or diagnosis of SARS-CoV-2 by FDA under an Emergency Use Authorization (EUA). This EUA will remain in effect (meaning this test can be used) for the duration of the COVID-19 declaration under Section 564(b)(1) of the Act, 21 U.S.C. section 360bbb-3(b)(1), unless the authorization is terminated or revoked.  Performed at [facility], [address] [place], [place] [address]      Labs: Basic Metabolic Panel: Recent Labs  Lab 03/31/24 1013 03/31/24 1015 04/01/24 0830 04/01/24 0919 04/01/24 1213 04/01/24 1718 04/01/24 2104 04/02/24 0023 04/02/24 0335  04/03/24 0334 04/04/24 0544  NA 146*   < > 149*   < > 149*   < > 144 143 140 140 138  K 4.0   < > 3.6   < > 3.3*   < > 3.3* 3.0* 3.0* 3.2* 4.1  CL 115*   < > 114*   < > 115*   < > 109 108 105 104 103  CO2 15*   < > 18*   < > 21*   < > 23 24 24 24 25   GLUCOSE 860*   < > 233*   < > 113*   < > 164* 186* 180* 262* 235*  BUN 26*   < > 22*   < > 19   < > 16 14 12 6 9   CREATININE 1.08   < > 1.05   < > 1.03   < > 0.86 0.79 0.78 0.84 0.68  CALCIUM  8.7*   < > 8.8*   < > 8.5*   < > 8.3* 8.1* 8.3* 7.8* 8.3*  MG  --    < > 2.1  --  1.7  --  1.8  --  1.5* 2.1 1.9  PHOS 1.6*  --  2.0*  --   --   --   --   --  2.5 2.6 4.3   < > = values in this interval not displayed.   Liver Function Tests: Recent Labs  Lab 03/30/24 2243 04/01/24 0830 04/02/24 0335 04/04/24 0544  AST 26 18 18   --   ALT 23 15 16   --   ALKPHOS 128* 90 91  --   BILITOT 3.1* 1.4* 1.5*  --   PROT 8.1 6.1* 6.0*  --   ALBUMIN 4.0 3.1* 2.9* 2.6*   No results for input(s): LIPASE, AMYLASE in the last 168 hours. Recent Labs  Lab 03/30/24 2040 04/01/24 0330  AMMONIA 38* 30   CBC: Recent Labs  Lab 03/30/24 2040 03/30/24 2042 03/31/24 0755 04/01/24 0330 04/01/24 0830 04/02/24 0335 04/03/24 0334  WBC 18.4*   < >  14.5* 8.7 8.2 6.6 4.9  NEUTROABS 14.7*  --   --   --  6.0 4.0  --   HGB 13.5   < > 14.6 13.2 12.9* 12.2* 11.3*  HCT 46.6   < > 45.6 41.2 40.4 39.2 36.3*  MCV 98.3   < > 88.4 88.6 88.2 91.0 91.0  PLT 400   < > 286 266 273 247 201   < > = values in this interval not displayed.   Cardiac Enzymes: Recent Labs  Lab 03/31/24 0305  CKTOTAL 238   BNP: BNP (last 3 results) No results for input(s): BNP in the last 8760 hours.  ProBNP (last 3 results) No results for input(s): PROBNP in the last 8760 hours.  CBG: Recent Labs  Lab 04/03/24 1120 04/03/24 1652 04/03/24 2129 04/04/24 0801 04/04/24 1210  GLUCAP 127* 155* 224* 238* 204*       Signed:  Hilda Lovings MD.  [facility] 04/04/2024, 6:05 PM

## 2024-04-05 LAB — CULTURE, BLOOD (ROUTINE X 2): Culture: NO GROWTH

## 2024-04-07 ENCOUNTER — Telehealth: Payer: Self-pay

## 2024-04-07 NOTE — Transitions of Care (Post Inpatient/ED Visit) (Signed)
   04/07/2024  Name: Edward Walls MRN: [id number] DOB: 1981-06-13  Today's TOC FU Call Status: Today's TOC FU Call Status:: Unsuccessful Call (1st Attempt) Unsuccessful Call (1st Attempt) Date: 04/07/24  Attempted to reach the patient regarding the most recent Inpatient/ED visit.  Follow Up Plan: Additional outreach attempts will be made to reach the patient to complete the Transitions of Care (Post Inpatient/ED visit) call.   Alan Ee, RN, BSN, CEN [employer]- Transition of Care Team.  [facility] [phone]

## 2024-04-08 ENCOUNTER — Telehealth: Payer: Self-pay

## 2024-04-08 NOTE — Transitions of Care (Post Inpatient/ED Visit) (Signed)
   04/08/2024  Name: Edward Walls MRN: [id number] DOB: Mar 12, 1981  Today's TOC FU Call Status: Today's TOC FU Call Status:: Unsuccessful Call (2nd Attempt) Unsuccessful Call (2nd Attempt) Date: 04/08/24  Attempted to reach the patient regarding the most recent Inpatient/ED visit.  Follow Up Plan: Additional outreach attempts will be made to reach the patient to complete the Transitions of Care (Post Inpatient/ED visit) call.   Alan Ee, RN, BSN, CEN [employer]- Transition of Care Team.  [facility] [phone]

## 2024-04-09 ENCOUNTER — Telehealth: Payer: Self-pay

## 2024-04-09 NOTE — Transitions of Care (Post Inpatient/ED Visit) (Signed)
   04/09/2024  Name: Edward Walls MRN: [id number] DOB: July 09, 1981  Today's TOC FU Call Status: Today's TOC FU Call Status:: Unsuccessful Call (3rd Attempt)  Attempted to reach the patient regarding the most recent Inpatient/ED visit.  Follow Up Plan: No further outreach attempts will be made at this time. We have been unable to contact the patient.  Alan Ee, RN, BSN, CEN [employer]- Transition of Care Team.  [facility] [phone]

## 2024-04-10 ENCOUNTER — Encounter: Payer: Self-pay | Admitting: Family Medicine

## 2024-04-10 ENCOUNTER — Ambulatory Visit: Payer: Self-pay | Admitting: Family Medicine

## 2024-04-10 ENCOUNTER — Ambulatory Visit: Admitting: Family Medicine

## 2024-04-10 VITALS — BP 108/68 | HR 97 | Temp 97.2°F | Ht 75.0 in | Wt 179.0 lb

## 2024-04-10 DIAGNOSIS — Z09 Encounter for follow-up examination after completed treatment for conditions other than malignant neoplasm: Secondary | ICD-10-CM | POA: Diagnosis not present

## 2024-04-10 LAB — CBC
HCT: 34.6 % — ABNORMAL LOW (ref 39.0–52.0)
Hemoglobin: 11.4 g/dL — ABNORMAL LOW (ref 13.0–17.0)
MCHC: 32.9 g/dL (ref 30.0–36.0)
MCV: 87.2 fl (ref 78.0–100.0)
Platelets: 454 10*3/uL — ABNORMAL HIGH (ref 150.0–400.0)
RBC: 3.97 Mil/uL — ABNORMAL LOW (ref 4.22–5.81)
RDW: 13.8 % (ref 11.5–15.5)
WBC: 4.8 10*3/uL (ref 4.0–10.5)

## 2024-04-10 LAB — BASIC METABOLIC PANEL WITH GFR
BUN: 10 mg/dL (ref 6–23)
CO2: 28 meq/L (ref 19–32)
Calcium: 8.7 mg/dL (ref 8.4–10.5)
Chloride: 99 meq/L (ref 96–112)
Creatinine, Ser: 0.76 mg/dL (ref 0.40–1.50)
GFR: 111.8 mL/min (ref >60.00)
Glucose, Bld: 306 mg/dL — ABNORMAL HIGH (ref 70–99)
Potassium: 3.7 meq/L (ref 3.5–5.1)
Sodium: 134 meq/L — ABNORMAL LOW (ref 135–145)

## 2024-04-10 LAB — PHOSPHORUS: Phosphorus: 5.6 mg/dL — ABNORMAL HIGH (ref 2.3–4.6)

## 2024-04-10 LAB — MAGNESIUM: Magnesium: 1.8 mg/dL (ref 1.5–2.5)

## 2024-04-10 NOTE — Progress Notes (Signed)
 Established Patient Office Visit   Subjective:  Patient ID: Edward Walls, male    DOB: 06-04-1981  Age: 43 y.o. MRN: [id number]  Chief Complaint  Patient presents with   Hospitalization Follow-up    Follow up from ER. Pt was admitted 6/15-6/15 for DKA. Pt states improvement.     HPI Encounter Diagnoses  Name Primary?   Hospital discharge follow-up Yes   For hospital discharge follow-up for DKA.  Admitted on 6/15 and discharged on the 20th.  Return to work on 6/22.  He is feeling much better now.  Was advised to follow-up with me and his endocrinologist in 2 to 3 weeks.  Right now he has an appointment scheduled with endocrinology at the end of July.   Review of Systems  Constitutional: Negative.   HENT: Negative.    Eyes:  Negative for blurred vision, discharge and redness.  Respiratory: Negative.    Cardiovascular: Negative.   Gastrointestinal:  Negative for abdominal pain.  Genitourinary: Negative.   Musculoskeletal: Negative.  Negative for myalgias.  Skin:  Negative for rash.  Neurological:  Negative for tingling, loss of consciousness and weakness.  Endo/Heme/Allergies:  Negative for polydipsia.     Current Outpatient Medications:    Continuous Blood Gluc Sensor (DEXCOM G6 SENSOR) MISC, Inject 1 Device into the skin continuous for 10 days., Disp: 9 each, Rfl: 3   Continuous Blood Gluc Sensor (DEXCOM G6 SENSOR) MISC, Inject 1 Device into the skin continuous for 10 days., Disp: 9 each, Rfl: 3   Continuous Glucose Sensor (DEXCOM G6 SENSOR) MISC, Replace every 10 days, Disp: 9 each, Rfl: 3   escitalopram  (LEXAPRO ) 10 MG tablet, Take 2 tablets (20 mg total) by mouth daily., Disp: 60 tablet, Rfl: 5   naproxen  (NAPROSYN ) 375 MG tablet, Take 375 mg by mouth 2 (two) times daily., Disp: , Rfl:    pantoprazole  (PROTONIX ) 40 MG tablet, Take 1 tablet (40 mg total) by mouth daily., Disp: 30 tablet, Rfl: 1   acetaminophen  (TYLENOL ) 325 MG tablet, Take 2 tablets (650 mg total) by  mouth every 6 (six) hours as needed for mild pain (or Fever >/= 101)., Disp: 20 tablet, Rfl: 0   Continuous Glucose Sensor (DEXCOM G6 SENSOR) MISC, 34 Units by Does not apply route daily. by Does not apply route., Disp: 3 each, Rfl: 0   Continuous Glucose Transmitter (DEXCOM G6 TRANSMITTER) MISC, USE AS DIRECTED, Disp: 1 each, Rfl: 3   escitalopram  (LEXAPRO ) 10 MG tablet, Take 1 tablet (10 mg total) by mouth daily., Disp: 30 tablet, Rfl: 1   feeding supplement, GLUCERNA SHAKE, (GLUCERNA SHAKE) LIQD, Take 237 mLs by mouth 3 (three) times daily between meals., Disp: 2370 mL, Rfl: 0   gabapentin  (NEURONTIN ) 300 MG capsule, Take 2 capsules (600 mg total) by mouth 3 (three) times daily., Disp: 180 capsule, Rfl: 11   insulin  glargine (LANTUS ) 100 UNIT/ML Solostar Pen, Inject 12 Units into the skin daily., Disp: 3.6 mL, Rfl: 2   Insulin  Lispro-aabc (LYUMJEV  KWIKPEN) 100 UNIT/ML KwikPen, Inject 8-12 Units into the skin in the morning, at noon, in the evening, and at bedtime., Disp: 30 mL, Rfl: 3   Insulin  Lispro-aabc (LYUMJEV ) 100 UNIT/ML SOLN, Inject 12 Units as directed 3 (three) times daily with meals., Disp: , Rfl:    Insulin  Pen Needle 32G X 4 MM MISC, Use 4-5x a day, Disp: 300 each, Rfl: 3   LANTUS  SOLOSTAR 100 UNIT/ML Solostar Pen, Inject 34 Units into the skin daily., Disp: ,  Rfl:    lidocaine  (LIDODERM ) 5 %, Place 1 patch onto the skin daily. Remove & Discard patch within 12 hours or as directed by MD (Patient not taking: Reported on 04/10/2024), Disp: 30 patch, Rfl: 0   metoCLOPramide  (REGLAN ) 10 MG tablet, Take 1 tablet (10 mg total) by mouth 3 (three) times daily with meals for 7 days, THEN 1 tablet (10 mg total) every 8 (eight) hours as needed for up to 7 days for nausea., Disp: 42 tablet, Rfl: 0   naproxen  (NAPROSYN ) 375 MG tablet, Take 1 tablet (375 mg total) by mouth 2 (two) times daily., Disp: 20 tablet, Rfl: 0   ondansetron  (ZOFRAN ) 4 MG tablet, Take 1 tablet (4 mg total) by mouth every 6  (six) hours as needed for nausea or vomiting. (Patient not taking: Reported on 04/10/2024), Disp: 20 tablet, Rfl: 0   pantoprazole  (PROTONIX ) 40 MG tablet, Take 40 mg by mouth 2 (two) times daily., Disp: , Rfl:    polyethylene glycol (MIRALAX  / GLYCOLAX ) 17 g packet, Take 17 g by mouth daily., Disp: 14 each, Rfl: 0   polyethylene glycol (MIRALAX  / GLYCOLAX ) 17 g packet, Take 17 g by mouth 2 (two) times daily. (Patient not taking: Reported on 04/10/2024), Disp: 14 each, Rfl: 0   potassium chloride  SA (KLOR-CON  M) 20 MEQ tablet, Take 2 tablets (40 mEq total) by mouth daily. (Patient not taking: Reported on 04/10/2024), Disp: 10 tablet, Rfl: 0   senna-docusate (SENOKOT-S) 8.6-50 MG tablet, Take 1 tablet by mouth 2 (two) times daily. (Patient not taking: Reported on 04/10/2024), Disp: , Rfl:    Objective:     BP 108/68 (Cuff Size: Normal)   Pulse 97   Temp (!) 97.2 F (36.2 C) (Temporal)   Ht 6' 3 (1.905 m)   Wt 179 lb (81.2 kg)   SpO2 100%   BMI 22.37 kg/m    Physical Exam Constitutional:      General: He is not in acute distress.    Appearance: Normal appearance. He is not ill-appearing, toxic-appearing or diaphoretic.  HENT:     Head: Normocephalic and atraumatic.     Right Ear: External ear normal.     Left Ear: External ear normal.     Mouth/Throat:     Mouth: Mucous membranes are moist.     Pharynx: Oropharynx is clear. No oropharyngeal exudate or posterior oropharyngeal erythema.   Eyes:     General: No scleral icterus.       Right eye: No discharge.        Left eye: No discharge.     Extraocular Movements: Extraocular movements intact.     Conjunctiva/sclera: Conjunctivae normal.     Pupils: Pupils are equal, round, and reactive to light.    Cardiovascular:     Rate and Rhythm: Normal rate and regular rhythm.  Pulmonary:     Effort: Pulmonary effort is normal. No respiratory distress.     Breath sounds: Normal breath sounds. No wheezing or rales.  Abdominal:      General: Bowel sounds are normal.   Musculoskeletal:     Cervical back: No rigidity or tenderness.   Skin:    General: Skin is warm and dry.   Neurological:     Mental Status: He is alert and oriented to person, place, and time.   Psychiatric:        Mood and Affect: Mood normal.        Behavior: Behavior normal.  No results found for any visits on 04/10/24.    The ASCVD Risk score (Arnett DK, et al., 2019) failed to calculate for the following reasons:   The valid total cholesterol range is 130 to 320 mg/dL    Assessment & Plan:   Hospital discharge follow-up -     Basic metabolic panel with GFR -     CBC -     Magnesium  -     Phosphorus    Return in about 3 months (around 07/11/2024), or Follow-up with endocrinology in the next few weeks..  Completed FMLA paperwork.  Discharge follow-up labs ordered.  Elsie Sim Lent, MD

## 2024-07-31 ENCOUNTER — Ambulatory Visit: Admitting: Family Medicine

## 2024-07-31 VITALS — BP 106/64 | HR 97 | Temp 96.9°F | Ht 75.0 in | Wt 172.0 lb

## 2024-07-31 DIAGNOSIS — Z13220 Encounter for screening for lipoid disorders: Secondary | ICD-10-CM

## 2024-07-31 DIAGNOSIS — R6882 Decreased libido: Secondary | ICD-10-CM | POA: Diagnosis not present

## 2024-07-31 DIAGNOSIS — Z9189 Other specified personal risk factors, not elsewhere classified: Secondary | ICD-10-CM | POA: Diagnosis not present

## 2024-07-31 DIAGNOSIS — N5201 Erectile dysfunction due to arterial insufficiency: Secondary | ICD-10-CM

## 2024-07-31 MED ORDER — SILDENAFIL CITRATE 100 MG PO TABS: 50.0000 mg | ORAL_TABLET | Freq: Every day | ORAL | 11 refills | Status: AC | PRN

## 2024-07-31 NOTE — Progress Notes (Addendum)
 Established Patient Office Visit   Subjective:  Patient ID: Edward Walls, male    DOB: [birth year]  Age: 43 y.o. MRN: [id number]  Chief Complaint  Patient presents with   Erectile Dysfunction    ED symptoms for the past year. Trouble achieving erection and a decreased libido.    Erectile Dysfunction   Encounter Diagnoses  Name Primary?   Libido, decreased Yes   Erectile dysfunction due to arterial insufficiency    Screening for cholesterol level    At risk for coronary artery disease    For follow-up of above.  As noted decreased desire for sex over the last year or so.  This has paralleled increasing issue with erectile performance.  Has difficulty achieving an erection.  No alcohol for 4 months.  No marijuana use for over a year.  Continues follow-up with endocrinology for type 1 diabetes.   Review of Systems  Constitutional: Negative.   HENT: Negative.    Eyes:  Negative for blurred vision, discharge and redness.  Respiratory: Negative.    Cardiovascular: Negative.   Gastrointestinal:  Negative for abdominal pain.  Genitourinary: Negative.   Musculoskeletal: Negative.  Negative for myalgias.  Skin:  Negative for rash.  Neurological:  Negative for tingling, loss of consciousness and weakness.  Endo/Heme/Allergies:  Negative for polydipsia.     Current Outpatient Medications:    atorvastatin (LIPITOR) 10 MG tablet, Take 1 tablet (10 mg total) by mouth every other day., Disp: 45 tablet, Rfl: 3   feeding supplement, GLUCERNA SHAKE, (GLUCERNA SHAKE) LIQD, Take 237 mLs by mouth 3 (three) times daily between meals., Disp: 2370 mL, Rfl: 0   Insulin  Lispro-aabc (LYUMJEV ) 100 UNIT/ML SOLN, Inject 12 Units as directed 3 (three) times daily with meals., Disp: , Rfl:    Insulin  Pen Needle 32G X 4 MM MISC, Use 4-5x a day, Disp: 300 each, Rfl: 3   LANTUS  SOLOSTAR 100 UNIT/ML Solostar Pen, Inject 34 Units into the skin daily., Disp: , Rfl:    metoCLOPramide  (REGLAN ) 10 MG tablet,  Take 1 tablet (10 mg total) by mouth 3 (three) times daily with meals for 7 days, THEN 1 tablet (10 mg total) every 8 (eight) hours as needed for up to 7 days for nausea., Disp: 42 tablet, Rfl: 0   ondansetron  (ZOFRAN ) 4 MG tablet, Take 1 tablet (4 mg total) by mouth every 6 (six) hours as needed for nausea or vomiting., Disp: 20 tablet, Rfl: 0   sildenafil (VIAGRA) 100 MG tablet, Take 0.5-1 tablets (50-100 mg total) by mouth daily as needed for erectile dysfunction., Disp: 5 tablet, Rfl: 11   Continuous Blood Gluc Sensor (DEXCOM G6 SENSOR) MISC, Inject 1 Device into the skin continuous for 10 days. (Patient not taking: Reported on 07/31/2024), Disp: 9 each, Rfl: 3   Continuous Blood Gluc Sensor (DEXCOM G6 SENSOR) MISC, Inject 1 Device into the skin continuous for 10 days., Disp: 9 each, Rfl: 3   Continuous Glucose Sensor (DEXCOM G6 SENSOR) MISC, Replace every 10 days, Disp: 9 each, Rfl: 3   Continuous Glucose Sensor (DEXCOM G6 SENSOR) MISC, 34 Units by Does not apply route daily. by Does not apply route., Disp: 3 each, Rfl: 0   Continuous Glucose Transmitter (DEXCOM G6 TRANSMITTER) MISC, USE AS DIRECTED, Disp: 1 each, Rfl: 3   insulin  glargine (LANTUS ) 100 UNIT/ML Solostar Pen, Inject 12 Units into the skin daily., Disp: 3.6 mL, Rfl: 2   Objective:     BP 106/64 (BP Location: Right Arm,  Cuff Size: Normal)   Pulse 97   Temp (!) 96.9 F (36.1 C) (Temporal)   Ht 6' 3 (1.905 m)   Wt 172 lb (78 kg)   SpO2 97%   BMI 21.50 kg/m    Physical Exam Constitutional:      General: He is not in acute distress.    Appearance: Normal appearance. He is not ill-appearing, toxic-appearing or diaphoretic.  HENT:     Head: Normocephalic and atraumatic.     Right Ear: External ear normal.     Left Ear: External ear normal.  Eyes:     General: No scleral icterus.       Right eye: No discharge.        Left eye: No discharge.     Extraocular Movements: Extraocular movements intact.      Conjunctiva/sclera: Conjunctivae normal.  Cardiovascular:     Rate and Rhythm: Normal rate and regular rhythm.  Pulmonary:     Effort: Pulmonary effort is normal. No respiratory distress.     Breath sounds: Normal breath sounds. No wheezing or rales.  Abdominal:     Hernia: There is no hernia in the left inguinal area or right inguinal area.  Genitourinary:    Penis: Circumcised. No hypospadias, erythema, tenderness, discharge, swelling or lesions.      Testes:        Right: Mass, tenderness or swelling not present. Right testis is descended.        Left: Mass, tenderness or swelling not present. Left testis is descended.     Epididymis:     Right: Not inflamed or enlarged.     Left: Not inflamed or enlarged.  Musculoskeletal:     Cervical back: No rigidity or tenderness.  Lymphadenopathy:     Lower Body: No right inguinal adenopathy. No left inguinal adenopathy.  Skin:    General: Skin is warm and dry.  Neurological:     Mental Status: He is alert and oriented to person, place, and time.  Psychiatric:        Mood and Affect: Mood normal.        Behavior: Behavior normal.      Results for orders placed or performed in visit on 07/31/24  Lipid panel  Result Value Ref Range   Cholesterol 141 0 - 200 mg/dL   Triglycerides 01.9 0.0 - 149.0 mg/dL   HDL 65.39 (L) >60.99 mg/dL   VLDL 80.3 0.0 - 59.9 mg/dL   LDL Cholesterol 87 0 - 99 mg/dL   Total CHOL/HDL Ratio 4    NonHDL 106.89   Testosterone Total,Free,Bio, Males-([facility])  Result Value Ref Range   Testosterone 622 250 - 827 ng/dL   Albumin 4.0 3.6 - 5.1 g/dL   Sex Hormone Binding 43 10 - 50 nmol/L   Testosterone, Free 71.2 46.0 - 224.0 pg/mL   Testosterone, Bioavailable 130.9 110.0 - 575.0 ng/dL      The 89-bzjm ASCVD risk score (Arnett DK, et al., 2019) is: 4.5%    Assessment & Plan:   Libido, decreased -     Testosterone Total,Free,Bio, Males  Erectile dysfunction due to arterial insufficiency -     Lipid  panel -     Sildenafil Citrate; Take 0.5-1 tablets (50-100 mg total) by mouth daily as needed for erectile dysfunction.  Dispense: 5 tablet; Refill: 11  Screening for cholesterol level -     Lipid panel  At risk for coronary artery disease -     Atorvastatin Calcium ; Take  1 tablet (10 mg total) by mouth every other day.  Dispense: 45 tablet; Refill: 3    Return in about 3 months (around 10/31/2024).  Lipid profile is nonfasting.  Discussed increased risk for vascular disease with diabetes.  May need to start statin.  Rx for sildenafil given.  Discussed usage.  Information given on this medication.  Information given on ED.  Be certain to follow-up with endocrinology for diabetic needs. Elsie Sim Lent, MD  10/20 addendum: Start atorvastatin 10 mg every other HDL levels are low and patient is type I diabetic.

## 2024-08-01 LAB — TESTOSTERONE TOTAL,FREE,BIO, MALES
Albumin: 4 g/dL (ref 3.6–5.1)
Sex Hormone Binding: 43 nmol/L (ref 10–50)
Testosterone, Bioavailable: 130.9 ng/dL (ref 110.0–575.0)
Testosterone, Free: 71.2 pg/mL (ref 46.0–224.0)
Testosterone: 622 ng/dL (ref 250–827)

## 2024-08-01 LAB — LIPID PANEL
Cholesterol: 141 mg/dL (ref 0–200)
HDL: 34.6 mg/dL — ABNORMAL LOW (ref >39.00)
LDL Cholesterol: 87 mg/dL (ref 0–99)
NonHDL: 106.89
Total CHOL/HDL Ratio: 4
Triglycerides: 98 mg/dL (ref 0.0–149.0)
VLDL: 19.6 mg/dL (ref 0.0–40.0)

## 2024-08-04 ENCOUNTER — Ambulatory Visit: Payer: Self-pay | Admitting: Family Medicine

## 2024-08-04 MED ORDER — ATORVASTATIN CALCIUM 10 MG PO TABS: 10.0000 mg | ORAL_TABLET | ORAL | 3 refills | Status: AC

## 2024-08-04 NOTE — Addendum Note (Signed)
 Addended by: BERNETA FALLOW A on: 08/04/2024 08:30 AM   Modules accepted: Orders

## 2024-11-07 ENCOUNTER — Ambulatory Visit: Payer: Self-pay

## 2024-11-07 NOTE — Telephone Encounter (Signed)
 Noted. Patient spoke with nurse triage and agreed to go to urgent care.

## 2024-11-07 NOTE — Telephone Encounter (Signed)
 FYI Only or Action Required?: FYI only for provider: Referred to UC.  Patient was last seen in primary care on 07/31/2024 by Berneta Elsie Sayre, MD.  Called Nurse Triage reporting Eye Pain.  Symptoms began 2 days ago.  Interventions attempted: Other: Eye rinses.  Symptoms are: stable.  Triage Disposition: See HCP Within 4 Hours (Or PCP Triage)  Patient/caregiver understands and will follow disposition?: Yes   Summary: Red Word:  Extreme Pain/Redness of the Eye   Reason for Triage: Patient called stating that his right eye is in extreme pain and its extremely red. Patient states he may need an emergency visit or medication and wants to know what he should do.     Reason for Disposition  MODERATE eye pain or discomfort (e.g., interferes with normal activities or awakens from sleep; more than mild)  Answer Assessment - Initial Assessment Questions Advised can call opthalmologist  1. ONSET: When did the pain start? (e.g., minutes, hours, days)     Few days ago 2. TIMING: Does the pain come and go, or has it been constant since it started? (e.g., constant, intermittent, fleeting)     constant 3. SEVERITY: How bad is the pain?  (Scale 1-10; mild, moderate or severe)     moderate 4. LOCATION: Where does it hurt?  (e.g., eyelid, eye, cheekbone)     Rt eye 5. CAUSE: What do you think is causing the pain?     unknown 6. VISION: Do you have blurred vision or changes in your vision?      no 7. EYE DISCHARGE: Is there any discharge (pus) from the eye(s)?  If Yes, ask: What color is it?      no 8. FEVER: Do you have a fever? If Yes, ask: What is it, how was it measured, and when did it start?      no 9. OTHER SYMPTOMS: Do you have any other symptoms? (e.g., headache, nasal discharge, facial rash)     Sclera red  Protocols used: Eye Pain and Other Symptoms-A-AH
